# Patient Record
Sex: Female | Born: 1940 | Race: White | Hispanic: No | Smoking: Former smoker
Health system: Southern US, Community
[De-identification: ages and names within clinical notes are randomized; demographics above are authoritative.]

## PROBLEM LIST (undated history)

## (undated) DIAGNOSIS — I639 Cerebral infarction, unspecified: Secondary | ICD-10-CM

## (undated) DIAGNOSIS — I10 Essential (primary) hypertension: Secondary | ICD-10-CM

## (undated) DIAGNOSIS — Z9889 Other specified postprocedural states: Secondary | ICD-10-CM

## (undated) DIAGNOSIS — K635 Polyp of colon: Secondary | ICD-10-CM

## (undated) DIAGNOSIS — M1990 Unspecified osteoarthritis, unspecified site: Secondary | ICD-10-CM

## (undated) DIAGNOSIS — R7303 Prediabetes: Secondary | ICD-10-CM

## (undated) DIAGNOSIS — R519 Headache, unspecified: Secondary | ICD-10-CM

## (undated) DIAGNOSIS — I2510 Atherosclerotic heart disease of native coronary artery without angina pectoris: Secondary | ICD-10-CM

## (undated) DIAGNOSIS — D126 Benign neoplasm of colon, unspecified: Secondary | ICD-10-CM

## (undated) DIAGNOSIS — J189 Pneumonia, unspecified organism: Secondary | ICD-10-CM

## (undated) DIAGNOSIS — Z8489 Family history of other specified conditions: Secondary | ICD-10-CM

## (undated) DIAGNOSIS — N281 Cyst of kidney, acquired: Secondary | ICD-10-CM

## (undated) DIAGNOSIS — I471 Supraventricular tachycardia, unspecified: Secondary | ICD-10-CM

## (undated) DIAGNOSIS — I498 Other specified cardiac arrhythmias: Secondary | ICD-10-CM

## (undated) DIAGNOSIS — F411 Generalized anxiety disorder: Secondary | ICD-10-CM

## (undated) DIAGNOSIS — K429 Umbilical hernia without obstruction or gangrene: Secondary | ICD-10-CM

## (undated) DIAGNOSIS — I700 Atherosclerosis of aorta: Secondary | ICD-10-CM

## (undated) DIAGNOSIS — K219 Gastro-esophageal reflux disease without esophagitis: Secondary | ICD-10-CM

## (undated) DIAGNOSIS — I499 Cardiac arrhythmia, unspecified: Secondary | ICD-10-CM

## (undated) DIAGNOSIS — Z86010 Personal history of colonic polyps: Secondary | ICD-10-CM

## (undated) DIAGNOSIS — G459 Transient cerebral ischemic attack, unspecified: Secondary | ICD-10-CM

## (undated) DIAGNOSIS — K2970 Gastritis, unspecified, without bleeding: Secondary | ICD-10-CM

## (undated) DIAGNOSIS — Q438 Other specified congenital malformations of intestine: Secondary | ICD-10-CM

## (undated) DIAGNOSIS — F419 Anxiety disorder, unspecified: Secondary | ICD-10-CM

## (undated) DIAGNOSIS — Z8719 Personal history of other diseases of the digestive system: Secondary | ICD-10-CM

## (undated) DIAGNOSIS — K760 Fatty (change of) liver, not elsewhere classified: Secondary | ICD-10-CM

## (undated) DIAGNOSIS — E785 Hyperlipidemia, unspecified: Secondary | ICD-10-CM

## (undated) HISTORY — PX: REPLACEMENT TOTAL HIP W/  RESURFACING IMPLANTS: SUR1222

## (undated) HISTORY — DX: Cardiac arrhythmia, unspecified: I49.9

## (undated) HISTORY — PX: ORIF ANKLE FRACTURE: SUR919

## (undated) HISTORY — PX: CHOLECYSTECTOMY: SHX55

---
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
## 1961-04-29 DIAGNOSIS — R58 Hemorrhage, not elsewhere classified: Secondary | ICD-10-CM

## 1969-04-29 HISTORY — PX: APPENDECTOMY: SHX54

## 1969-04-29 HISTORY — PX: TUBAL LIGATION: SHX77

## 1978-04-29 HISTORY — PX: ARTHRODESIS: SHX136

## 1978-04-29 HISTORY — PX: BACK SURGERY: SHX140

## 1983-04-30 HISTORY — PX: LAPAROSCOPIC UNILATERAL SALPINGO OOPHERECTOMY: SHX5935

## 1983-04-30 HISTORY — PX: VAGINAL HYSTERECTOMY: SUR661

## 2001-09-25 DIAGNOSIS — I2510 Atherosclerotic heart disease of native coronary artery without angina pectoris: Secondary | ICD-10-CM

## 2001-09-25 HISTORY — DX: Atherosclerotic heart disease of native coronary artery without angina pectoris: I25.10

## 2001-09-25 HISTORY — PX: LEFT HEART CATH AND CORONARY ANGIOGRAPHY: CATH118249

## 2003-06-08 ENCOUNTER — Other Ambulatory Visit: Payer: Self-pay

## 2003-06-09 ENCOUNTER — Other Ambulatory Visit: Payer: Self-pay

## 2003-06-09 HISTORY — PX: LEFT HEART CATH AND CORONARY ANGIOGRAPHY: CATH118249

## 2003-06-10 ENCOUNTER — Other Ambulatory Visit: Payer: Self-pay

## 2005-03-18 ENCOUNTER — Other Ambulatory Visit: Payer: Self-pay

## 2005-03-18 ENCOUNTER — Inpatient Hospital Stay: Payer: Self-pay | Admitting: Internal Medicine

## 2006-02-25 ENCOUNTER — Inpatient Hospital Stay: Payer: Self-pay | Admitting: Specialist

## 2006-02-26 ENCOUNTER — Other Ambulatory Visit: Payer: Self-pay

## 2006-04-28 ENCOUNTER — Emergency Department: Payer: Self-pay | Admitting: Emergency Medicine

## 2007-07-15 ENCOUNTER — Emergency Department: Payer: Self-pay | Admitting: Internal Medicine

## 2009-10-27 ENCOUNTER — Emergency Department: Payer: Self-pay | Admitting: Emergency Medicine

## 2009-12-11 ENCOUNTER — Emergency Department: Payer: Self-pay | Admitting: Emergency Medicine

## 2010-06-13 ENCOUNTER — Emergency Department: Payer: Self-pay | Admitting: Emergency Medicine

## 2012-07-05 ENCOUNTER — Emergency Department: Payer: Self-pay | Admitting: Emergency Medicine

## 2012-07-05 LAB — BASIC METABOLIC PANEL
BUN: 14 mg/dL (ref 7–18)
Calcium, Total: 9 mg/dL (ref 8.5–10.1)
Creatinine: 1.13 mg/dL (ref 0.60–1.30)
EGFR (Non-African Amer.): 49 — ABNORMAL LOW
Glucose: 95 mg/dL (ref 65–99)
Osmolality: 270 (ref 275–301)
Sodium: 135 mmol/L — ABNORMAL LOW (ref 136–145)

## 2012-07-05 LAB — CBC
HCT: 42 % (ref 35.0–47.0)
HGB: 14.2 g/dL (ref 12.0–16.0)
MCH: 29.3 pg (ref 26.0–34.0)
MCHC: 33.9 g/dL (ref 32.0–36.0)
MCV: 86 fL (ref 80–100)
RBC: 4.87 10*6/uL (ref 3.80–5.20)

## 2012-07-05 LAB — URINALYSIS, COMPLETE
Bilirubin,UR: NEGATIVE
Glucose,UR: NEGATIVE mg/dL (ref 0–75)
Ketone: NEGATIVE
Nitrite: NEGATIVE
Ph: 7 (ref 4.5–8.0)
RBC,UR: 1878 /HPF (ref 0–5)
Squamous Epithelial: 2
WBC UR: 95 /HPF (ref 0–5)

## 2012-07-05 LAB — TROPONIN I: Troponin-I: <0.02 ng/mL

## 2012-07-08 LAB — URINE CULTURE

## 2012-11-20 DIAGNOSIS — N3020 Other chronic cystitis without hematuria: Secondary | ICD-10-CM | POA: Insufficient documentation

## 2012-11-20 DIAGNOSIS — N23 Unspecified renal colic: Secondary | ICD-10-CM | POA: Insufficient documentation

## 2012-11-20 DIAGNOSIS — R339 Retention of urine, unspecified: Secondary | ICD-10-CM | POA: Insufficient documentation

## 2012-11-20 DIAGNOSIS — N3941 Urge incontinence: Secondary | ICD-10-CM | POA: Insufficient documentation

## 2012-11-20 DIAGNOSIS — R399 Unspecified symptoms and signs involving the genitourinary system: Secondary | ICD-10-CM | POA: Insufficient documentation

## 2012-11-20 DIAGNOSIS — R310 Gross hematuria: Secondary | ICD-10-CM | POA: Insufficient documentation

## 2013-03-06 ENCOUNTER — Emergency Department: Payer: Self-pay | Admitting: Emergency Medicine

## 2013-03-06 LAB — URINALYSIS, COMPLETE
Glucose,UR: NEGATIVE mg/dL (ref 0–75)
Ketone: NEGATIVE
Nitrite: NEGATIVE
Ph: 8 (ref 4.5–8.0)
Protein: NEGATIVE
RBC,UR: 5 /HPF (ref 0–5)
Specific Gravity: 1.004 (ref 1.003–1.030)
Squamous Epithelial: 1
WBC UR: 249 /HPF (ref 0–5)

## 2013-03-16 DIAGNOSIS — Q619 Cystic kidney disease, unspecified: Secondary | ICD-10-CM | POA: Insufficient documentation

## 2013-07-01 DIAGNOSIS — N8111 Cystocele, midline: Secondary | ICD-10-CM | POA: Insufficient documentation

## 2013-12-02 LAB — HM MAMMOGRAPHY: HM MAMMO: NORMAL

## 2014-04-16 ENCOUNTER — Emergency Department: Payer: Self-pay | Admitting: Student

## 2014-05-09 DIAGNOSIS — Z23 Encounter for immunization: Secondary | ICD-10-CM | POA: Diagnosis not present

## 2014-05-14 ENCOUNTER — Observation Stay: Payer: Self-pay | Admitting: Internal Medicine

## 2014-05-14 DIAGNOSIS — R079 Chest pain, unspecified: Secondary | ICD-10-CM | POA: Diagnosis not present

## 2014-05-14 DIAGNOSIS — Z881 Allergy status to other antibiotic agents status: Secondary | ICD-10-CM | POA: Diagnosis not present

## 2014-05-14 DIAGNOSIS — I214 Non-ST elevation (NSTEMI) myocardial infarction: Secondary | ICD-10-CM | POA: Diagnosis not present

## 2014-05-14 DIAGNOSIS — Z888 Allergy status to other drugs, medicaments and biological substances status: Secondary | ICD-10-CM | POA: Diagnosis not present

## 2014-05-14 DIAGNOSIS — I10 Essential (primary) hypertension: Secondary | ICD-10-CM | POA: Diagnosis not present

## 2014-05-14 DIAGNOSIS — R0789 Other chest pain: Secondary | ICD-10-CM | POA: Diagnosis not present

## 2014-05-14 DIAGNOSIS — I340 Nonrheumatic mitral (valve) insufficiency: Secondary | ICD-10-CM | POA: Diagnosis not present

## 2014-05-14 DIAGNOSIS — Z882 Allergy status to sulfonamides status: Secondary | ICD-10-CM | POA: Diagnosis not present

## 2014-05-14 DIAGNOSIS — Z7982 Long term (current) use of aspirin: Secondary | ICD-10-CM | POA: Diagnosis not present

## 2014-05-14 DIAGNOSIS — I2510 Atherosclerotic heart disease of native coronary artery without angina pectoris: Secondary | ICD-10-CM | POA: Diagnosis not present

## 2014-05-14 DIAGNOSIS — Z90710 Acquired absence of both cervix and uterus: Secondary | ICD-10-CM | POA: Diagnosis not present

## 2014-05-14 DIAGNOSIS — K219 Gastro-esophageal reflux disease without esophagitis: Secondary | ICD-10-CM | POA: Diagnosis not present

## 2014-05-14 DIAGNOSIS — E785 Hyperlipidemia, unspecified: Secondary | ICD-10-CM | POA: Diagnosis not present

## 2014-05-14 DIAGNOSIS — I517 Cardiomegaly: Secondary | ICD-10-CM | POA: Diagnosis not present

## 2014-05-14 DIAGNOSIS — J9811 Atelectasis: Secondary | ICD-10-CM | POA: Diagnosis not present

## 2014-05-14 LAB — BASIC METABOLIC PANEL
Anion Gap: 7 (ref 7–16)
BUN: 11 mg/dL (ref 7–18)
Calcium, Total: 8.6 mg/dL (ref 8.5–10.1)
Chloride: 104 mmol/L (ref 98–107)
Co2: 29 mmol/L (ref 21–32)
Creatinine: 1.17 mg/dL (ref 0.60–1.30)
EGFR (African American): 58 — ABNORMAL LOW
EGFR (Non-African Amer.): 48 — ABNORMAL LOW
GLUCOSE: 95 mg/dL (ref 65–99)
Osmolality: 279 (ref 275–301)
Potassium: 3.6 mmol/L (ref 3.5–5.1)
Sodium: 140 mmol/L (ref 136–145)

## 2014-05-14 LAB — CBC
HCT: 42.5 % (ref 35.0–47.0)
HGB: 14.1 g/dL (ref 12.0–16.0)
MCH: 29.3 pg (ref 26.0–34.0)
MCHC: 33.1 g/dL (ref 32.0–36.0)
MCV: 89 fL (ref 80–100)
Platelet: 251 10*3/uL (ref 150–440)
RBC: 4.8 10*6/uL (ref 3.80–5.20)
RDW: 14 % (ref 11.5–14.5)
WBC: 6.6 10*3/uL (ref 3.6–11.0)

## 2014-05-14 LAB — HEPATIC FUNCTION PANEL A (ARMC)
ALK PHOS: 97 U/L
ALT: 23 U/L
AST: 32 U/L (ref 15–37)
Albumin: 3.8 g/dL (ref 3.4–5.0)
Bilirubin, Direct: <0.1 mg/dL (ref 0.0–0.2)
Bilirubin,Total: 0.3 mg/dL (ref 0.2–1.0)
Total Protein: 7.7 g/dL (ref 6.4–8.2)

## 2014-05-14 LAB — CK-MB
CK-MB: 2.5 ng/mL (ref 0.5–3.6)
CK-MB: 2.3 ng/mL (ref 0.5–3.6)

## 2014-05-14 LAB — TROPONIN I: Troponin-I: <0.02 ng/mL

## 2014-05-14 LAB — LIPASE, BLOOD: LIPASE: 156 U/L (ref 73–393)

## 2014-05-15 DIAGNOSIS — I2510 Atherosclerotic heart disease of native coronary artery without angina pectoris: Secondary | ICD-10-CM | POA: Diagnosis not present

## 2014-05-15 DIAGNOSIS — R079 Chest pain, unspecified: Secondary | ICD-10-CM | POA: Diagnosis not present

## 2014-05-15 DIAGNOSIS — I214 Non-ST elevation (NSTEMI) myocardial infarction: Secondary | ICD-10-CM | POA: Diagnosis not present

## 2014-05-15 DIAGNOSIS — K219 Gastro-esophageal reflux disease without esophagitis: Secondary | ICD-10-CM | POA: Diagnosis not present

## 2014-05-15 DIAGNOSIS — I10 Essential (primary) hypertension: Secondary | ICD-10-CM | POA: Diagnosis not present

## 2014-05-15 LAB — CBC WITH DIFFERENTIAL/PLATELET
Basophil #: 0.1 10*3/uL (ref 0.0–0.1)
Basophil %: 1.2 %
EOS PCT: 2 %
Eosinophil #: 0.2 10*3/uL (ref 0.0–0.7)
HCT: 39.1 % (ref 35.0–47.0)
HGB: 13.2 g/dL (ref 12.0–16.0)
Lymphocyte #: 3.5 10*3/uL (ref 1.0–3.6)
Lymphocyte %: 44.5 %
MCH: 29.9 pg (ref 26.0–34.0)
MCHC: 33.7 g/dL (ref 32.0–36.0)
MCV: 89 fL (ref 80–100)
MONOS PCT: 9.8 %
Monocyte #: 0.8 x10 3/mm (ref 0.2–0.9)
NEUTROS ABS: 3.3 10*3/uL (ref 1.4–6.5)
Neutrophil %: 42.5 %
Platelet: 234 10*3/uL (ref 150–440)
RBC: 4.41 10*6/uL (ref 3.80–5.20)
RDW: 14.2 % (ref 11.5–14.5)
WBC: 7.8 10*3/uL (ref 3.6–11.0)

## 2014-05-15 LAB — TSH: THYROID STIMULATING HORM: 3.12 u[IU]/mL

## 2014-05-15 LAB — CK-MB: CK-MB: 1.9 ng/mL (ref 0.5–3.6)

## 2014-05-15 LAB — LIPID PANEL
Cholesterol: 202 mg/dL — ABNORMAL HIGH (ref 0–200)
HDL Cholesterol: 36 mg/dL — ABNORMAL LOW (ref 40–60)
LDL CHOLESTEROL, CALC: 118 mg/dL — AB (ref 0–100)
TRIGLYCERIDES: 238 mg/dL — AB (ref 0–200)
VLDL CHOLESTEROL, CALC: 48 mg/dL — AB (ref 5–40)

## 2014-05-15 LAB — BASIC METABOLIC PANEL
Anion Gap: 10 (ref 7–16)
BUN: 13 mg/dL (ref 7–18)
CHLORIDE: 106 mmol/L (ref 98–107)
Calcium, Total: 8.5 mg/dL (ref 8.5–10.1)
Co2: 26 mmol/L (ref 21–32)
Creatinine: 1.19 mg/dL (ref 0.60–1.30)
EGFR (African American): 57 — ABNORMAL LOW
GFR CALC NON AF AMER: 47 — AB
GLUCOSE: 134 mg/dL — AB (ref 65–99)
Osmolality: 285 (ref 275–301)
POTASSIUM: 3.2 mmol/L — AB (ref 3.5–5.1)
SODIUM: 142 mmol/L (ref 136–145)

## 2014-05-15 LAB — TROPONIN I: Troponin-I: <0.02 ng/mL

## 2014-05-18 DIAGNOSIS — R079 Chest pain, unspecified: Secondary | ICD-10-CM | POA: Diagnosis not present

## 2014-05-18 DIAGNOSIS — E785 Hyperlipidemia, unspecified: Secondary | ICD-10-CM | POA: Diagnosis not present

## 2014-05-18 DIAGNOSIS — K209 Esophagitis, unspecified: Secondary | ICD-10-CM | POA: Diagnosis not present

## 2014-05-18 DIAGNOSIS — I10 Essential (primary) hypertension: Secondary | ICD-10-CM | POA: Diagnosis not present

## 2014-05-19 DIAGNOSIS — I2510 Atherosclerotic heart disease of native coronary artery without angina pectoris: Secondary | ICD-10-CM | POA: Insufficient documentation

## 2014-05-19 DIAGNOSIS — R079 Chest pain, unspecified: Secondary | ICD-10-CM | POA: Insufficient documentation

## 2014-05-19 DIAGNOSIS — I25119 Atherosclerotic heart disease of native coronary artery with unspecified angina pectoris: Secondary | ICD-10-CM | POA: Diagnosis not present

## 2014-05-19 DIAGNOSIS — K219 Gastro-esophageal reflux disease without esophagitis: Secondary | ICD-10-CM | POA: Insufficient documentation

## 2014-05-19 DIAGNOSIS — E782 Mixed hyperlipidemia: Secondary | ICD-10-CM | POA: Diagnosis not present

## 2014-05-19 DIAGNOSIS — I10 Essential (primary) hypertension: Secondary | ICD-10-CM | POA: Diagnosis not present

## 2014-05-19 DIAGNOSIS — K210 Gastro-esophageal reflux disease with esophagitis: Secondary | ICD-10-CM | POA: Diagnosis not present

## 2014-06-01 DIAGNOSIS — E782 Mixed hyperlipidemia: Secondary | ICD-10-CM | POA: Diagnosis not present

## 2014-06-01 DIAGNOSIS — I2510 Atherosclerotic heart disease of native coronary artery without angina pectoris: Secondary | ICD-10-CM | POA: Diagnosis not present

## 2014-06-01 DIAGNOSIS — I10 Essential (primary) hypertension: Secondary | ICD-10-CM | POA: Diagnosis not present

## 2014-06-01 DIAGNOSIS — R0789 Other chest pain: Secondary | ICD-10-CM | POA: Diagnosis not present

## 2014-06-01 DIAGNOSIS — R079 Chest pain, unspecified: Secondary | ICD-10-CM | POA: Diagnosis not present

## 2014-07-01 DIAGNOSIS — N8111 Cystocele, midline: Secondary | ICD-10-CM | POA: Diagnosis not present

## 2014-07-01 DIAGNOSIS — N1330 Unspecified hydronephrosis: Secondary | ICD-10-CM | POA: Diagnosis not present

## 2014-07-01 DIAGNOSIS — Q6100 Congenital renal cyst, unspecified: Secondary | ICD-10-CM | POA: Diagnosis not present

## 2014-07-01 DIAGNOSIS — N3289 Other specified disorders of bladder: Secondary | ICD-10-CM | POA: Diagnosis not present

## 2014-07-01 DIAGNOSIS — R310 Gross hematuria: Secondary | ICD-10-CM | POA: Diagnosis not present

## 2014-07-01 DIAGNOSIS — N281 Cyst of kidney, acquired: Secondary | ICD-10-CM | POA: Diagnosis not present

## 2014-07-01 DIAGNOSIS — N3020 Other chronic cystitis without hematuria: Secondary | ICD-10-CM | POA: Diagnosis not present

## 2014-07-01 DIAGNOSIS — N3941 Urge incontinence: Secondary | ICD-10-CM | POA: Diagnosis not present

## 2014-07-01 DIAGNOSIS — N134 Hydroureter: Secondary | ICD-10-CM | POA: Diagnosis not present

## 2014-08-28 NOTE — H&P (Signed)
PATIENT NAME:  Amanda Duke, MARSCHKE MR#:  [id number] DATE OF BIRTH:  06-14-1940  DATE OF ADMISSION:  05/14/2014  PRIMARY CARE PHYSICIAN: Fara Olden B. Jacqualine Code, MD   PRIMARY CARDIOLOGISTS: Dr. Clayborn Bigness, Dr. Saralyn Pilar.  REFERRING EMERGENCY ROOM PHYSICIAN: Cory R. Karma Greaser, MD   CHIEF COMPLAINT: Chest pain.   HISTORY OF PRESENT ILLNESS: The patient is a 74 year old pleasant Caucasian female with a past medical history of coronary artery disease, 2-vessel blockage, cardiac catheterization more than 5 years ago with no blockages. As reported by the patient, is presenting to the ED with a chief complaint of chest pain. The patient reports that she was in her usual state of health until today morning when eventually, she was under stress, and  while trying to hang a clock in the house, she started having sharp pressure-like chest pain. The chest pain was radiating to the right side, associated with cold and clamminess and shortness of breath. She was dizzy. The patient tried to rest for 30 minutes, following which her pressure was better, but still she had some residual chest pressure, which made her come to the hospital. According to the daughter, when the EMS came, her initial blood pressure was at 198/118, and heart rate was at 100. In the ED, the patient was given aspirin, initial troponin was negative. EKG has revealed no acute ST or T-wave elevations or depressions. The hospitalist team is called to admit the patient. During my examination, the patient is resting comfortably. Daughter is bedside. Denies any chest pressure.   PAST MEDICAL HISTORY: Coronary artery disease with a history of 2 vessel blockages on medical management, hypertension, hyperlipidemia, and GERD.   PAST SURGICAL HISTORY: Hysterectomy, back surgery, ankle surgery, cardiac catheterization more than 5 years ago.   ALLERGIES: ALLERGIC TO METOPROLOL, SULFA, ZOCOR, LOPID, LEVAQUIN, CLINDAMYCIN, AMOXICILLIN.   PSYCHOSOCIAL HISTORY: Lives at  home with son. No history of smoking, alcohol, or illicit drug use.   FAMILY HISTORY: Heart disease runs in her family.   REVIEW OF SYSTEMS: CONSTITUTIONAL: Denies any fever, fatigue, weakness.  EYES: Denies blurry vision, double vision, glaucoma.  EARS, NOSE, AND THROAT: Denies epistaxis, discharge.  RESPIRATORY: Denies cough, COPD.  CARDIOVASCULAR: Complaining of chest pressure, which is relieved after getting 4 baby aspirin. Denies palpitations, syncope.  GASTROINTESTINAL: Denies nausea, vomiting, diarrhea, abdominal pain, hematemesis.  GENITOURINARY: No dysuria, hematuria.  GYNECOLOGICAL: Breast: Denies breast mass or vaginal discharge  ENDOCRINE: Denies polyuria, nocturia, or thyroid problems.  HEMATOLOGIC AND LYMPHATIC: No anemia, easy bruising, bleeding.  INTEGUMENTARY: No acne, rash, lesions.  MUSCULOSKELETAL: No joint pain in the neck and back. Denies any gout.  NEUROLOGIC: Denies vertigo, ataxia. TIA, CVA.  PSYCHIATRIC: No ADD or OCD.   HOME MEDICATIONS:  1. Clonazepam 0.5 milligram 1 tablet once daily. 2. Prednisone tapering dose for 4 days.  3. Crestor 10 mg p.o. once daily.  4. Hydrochlorothiazide/Lisinopril 12.5/10 one tablet p.o. once daily.  5. Hydroxyzine 20 mg q. 6 hours as needed for itching.  6. Omeprazole 40 mg p.o. once daily.   PHYSICAL EXAMINATION: VITAL SIGNS: Temperature 98.6, pulse 72, respirations 16-18, blood pressure 137/81 and repeat blood pressure 147/74, pulse oximetry 99% on room air.  GENERAL APPEARANCE: Not in acute distress. Moderately built and nourished.  HEENT: Normocephalic, atraumatic. Pupils are equally reactive to light and accommodation. No scleral icterus. No conjunctival injection. No sinus tenderness. No postnasal drip. Moist mucous membranes.  NECK: Supple. No JVD. No thyromegaly. Range of motion is intact.  LUNGS: Clear to auscultation bilaterally.  No accessory muscle use and no anterior chest wall tenderness on palpation.  CARDIAC:  S1, S2 normal. Regular rate and rhythm, faint cardiac murmur is present 2/5. No anterior chest wall tenderness on palpation.  GASTROINTESTINAL: Soft. Bowel sounds are positive in all 4 quadrants. Nontender, nondistended. No hepatosplenomegaly. No masses felt.  NEUROLOGIC: Awake, alert, and oriented x 3. Cranial nerves II through XII are grossly intact. Motor and sensory are intact. Reflexes are 2+.  EXTREMITIES: No edema. No cyanosis. No clubbing.  SKIN: Warm to touch. Normal turgor. No rashes. No lesions.  MUSCULOSKELETAL: No joint effusion, tenderness, erythema.  PSYCHIATRIC: Normal mood and affect.   LABORATORY DATA AND IMAGING STUDIES: Chest x-ray, portable: Stable mild cardiomegaly, minimal linear atelectasis at the left lung base. No acute cardiopulmonary disease, otherwise hiatal hernia. A 12-lead EKG: Normal sinus rhythm, no acute ST or T-wave changes. Initial troponin was 10.02. LFTs are normal. BMP is normal except GFR at 48. CBC is normal.   ASSESSMENT AND PLAN: A 74 year old Caucasian female presenting to the ED with chief complaint of chest pressure, lasted for approximately 30 minutes, and subsequently resolved completely with 4 baby aspirin. Will be admitted with the following assessment and plan.  1. Chest pain with history of coronary artery disease and 2-vessel blockage. Cardiac catheterization was done greater than 5 years ago. Will rule out acute myocardial infarction, admit to observation telemetry, cycle cardiac biomarkers. The patient will be on ACS protocol. The patient being allergic to METOPROLOL, I cannot give her METOPROLOL. We will continue aspirin, statin, and morphine as needed basis. Continue oxygen. If troponins are trending, we will consider cardiology consult.  2. Hypertension. Blood pressure is stable at this time. Will continue her home medication hydrochlorothiazide and lisinopril and up titrate on as needed basis.  3. Hyperlipidemia. Continue statin. Check fasting  lipid panel.  4. Gastroesophageal reflux disease. The patient will be on H2-blockers during the hospital course. We will provide her GI prophylaxis with Pepcid and DVT prophylaxis with Lovenox subcutaneous.   The plan of care discussed in detail with the patient and her daughter at bedside. They both verbalized understanding of the plan.   TOTAL TIME SPENT ON THE ADMISSION: 45 minutes.    ____________________________ Nicholes Mango, MD ag:mw D: 05/14/2014 18:42:50 ET T: 05/14/2014 19:42:13 ET JOB#: [id number]  cc: Nicholes Mango, MD, <Dictator> Nicholes Mango MD ELECTRONICALLY SIGNED 05/15/2014 14:58

## 2014-08-28 NOTE — Discharge Summary (Signed)
PATIENT NAME:  Amanda Duke, Amanda Duke MR#:  [id number] DATE OF BIRTH:  July 30, 1940  DATE OF ADMISSION:  05/14/2014 DATE OF DISCHARGE:  05/15/2014  ADMISSION DIAGNOSIS: Chest pain.   DISCHARGE DIAGNOSES:  1.  Chest pain, atypical in nature; negative cardiac enzymes and needs outpatient cardiology followup. The chest pain could have been related to accelerated blood pressure on presentation and anxiety.   2.  Coronary artery disease with history of 2-vessel blockage, on medical management.  3.  Hypertension.  4.  Hyperlipidemia.  5.  Status post hysterectomy.  6.  Status post back surgery.  7.  Status post ankle surgery.   CONSULTANTS: None.   PERTINENT LABORATORY AND EVALUATIONS: WBC count 6.6, hemoglobin 14.1, platelet count 251,000. On EKG, normal sinus rhythm without any ST-T wave changes. Echocardiogram normal ejection fraction, normal global left ventricular systolic function, mild mitral valve regurgitation. Chest x-ray showed no cardiopulmonary processes, except minimal linear atelectasis.   HOSPITAL COURSE: Please refer to H and P done by the admitting physician. The patient is a 74 year old white female who was under stress at home and started having chest pain. The patient was admitted under observation and serial cardiac enzymes were done. On presentation, her blood pressure was elevated. Due to these symptoms, she was admitted to the hospital and placed under observation overnight, with no further chest pain. Her cardiac enzymes remained negative. She has seen a cardiologist in the past, and will need outpatient followup with a cardiologist in the coming week to see if any further workup needs to be done. At this time, she is stable for discharge.   DISCHARGE MEDICATIONS: Omeprazole 40 daily, hydroxyzine/hydrochloride 25 mg 1 tablet 4 times a day as needed, clonazepam 0.5 daily as needed, Crestor 20 daily at bedtime, hydrochlorothiazide/lisinopril 12.5/10 daily, and aspirin 325 p.o. daily.    DIET: Low-sodium, low-fat, low-cholesterol.   FOLLOWUP: [facility] in 1-2 weeks. Follow with primary MD in 1-2 weeks. The patient is to keep a log of her blood pressures to take to primary care provider, due to her labile blood pressure.   TIME SPENT ON THIS DISCHARGE: 35 minutes.     ____________________________ Lafonda Mosses Posey Pronto, MD shp:MT D: 05/16/2014 14:36:01 ET T: 05/16/2014 14:52:54 ET JOB#: [id number]  cc: Tessi Eustache H. Posey Pronto, MD, <Dictator> Alric Seton MD ELECTRONICALLY SIGNED 05/18/2014 14:41

## 2014-08-29 DIAGNOSIS — E782 Mixed hyperlipidemia: Secondary | ICD-10-CM | POA: Diagnosis not present

## 2014-08-29 DIAGNOSIS — G4700 Insomnia, unspecified: Secondary | ICD-10-CM | POA: Diagnosis not present

## 2014-08-29 DIAGNOSIS — E2839 Other primary ovarian failure: Secondary | ICD-10-CM | POA: Diagnosis not present

## 2014-08-29 DIAGNOSIS — M810 Age-related osteoporosis without current pathological fracture: Secondary | ICD-10-CM | POA: Diagnosis not present

## 2014-08-29 DIAGNOSIS — I10 Essential (primary) hypertension: Secondary | ICD-10-CM | POA: Diagnosis not present

## 2014-08-30 DIAGNOSIS — Z0001 Encounter for general adult medical examination with abnormal findings: Secondary | ICD-10-CM | POA: Diagnosis not present

## 2014-08-30 DIAGNOSIS — E782 Mixed hyperlipidemia: Secondary | ICD-10-CM | POA: Diagnosis not present

## 2014-08-30 DIAGNOSIS — N183 Chronic kidney disease, stage 3 (moderate): Secondary | ICD-10-CM | POA: Diagnosis not present

## 2014-08-30 DIAGNOSIS — M810 Age-related osteoporosis without current pathological fracture: Secondary | ICD-10-CM | POA: Diagnosis not present

## 2014-08-30 DIAGNOSIS — I10 Essential (primary) hypertension: Secondary | ICD-10-CM | POA: Diagnosis not present

## 2014-09-30 DIAGNOSIS — H2513 Age-related nuclear cataract, bilateral: Secondary | ICD-10-CM | POA: Diagnosis not present

## 2014-10-04 DIAGNOSIS — R300 Dysuria: Secondary | ICD-10-CM | POA: Diagnosis not present

## 2014-10-04 DIAGNOSIS — I10 Essential (primary) hypertension: Secondary | ICD-10-CM | POA: Diagnosis not present

## 2014-10-04 DIAGNOSIS — E782 Mixed hyperlipidemia: Secondary | ICD-10-CM | POA: Diagnosis not present

## 2014-10-04 DIAGNOSIS — N189 Chronic kidney disease, unspecified: Secondary | ICD-10-CM | POA: Diagnosis not present

## 2014-10-04 DIAGNOSIS — R7301 Impaired fasting glucose: Secondary | ICD-10-CM | POA: Diagnosis not present

## 2014-10-04 DIAGNOSIS — E559 Vitamin D deficiency, unspecified: Secondary | ICD-10-CM | POA: Diagnosis not present

## 2014-10-04 DIAGNOSIS — Z0001 Encounter for general adult medical examination with abnormal findings: Secondary | ICD-10-CM | POA: Diagnosis not present

## 2014-12-16 DIAGNOSIS — I10 Essential (primary) hypertension: Secondary | ICD-10-CM | POA: Insufficient documentation

## 2014-12-28 DIAGNOSIS — I10 Essential (primary) hypertension: Secondary | ICD-10-CM | POA: Diagnosis not present

## 2014-12-28 DIAGNOSIS — I2510 Atherosclerotic heart disease of native coronary artery without angina pectoris: Secondary | ICD-10-CM | POA: Diagnosis not present

## 2014-12-28 DIAGNOSIS — E782 Mixed hyperlipidemia: Secondary | ICD-10-CM | POA: Diagnosis not present

## 2015-01-09 DIAGNOSIS — E782 Mixed hyperlipidemia: Secondary | ICD-10-CM | POA: Diagnosis not present

## 2015-01-09 DIAGNOSIS — I10 Essential (primary) hypertension: Secondary | ICD-10-CM | POA: Diagnosis not present

## 2015-01-09 DIAGNOSIS — E559 Vitamin D deficiency, unspecified: Secondary | ICD-10-CM | POA: Diagnosis not present

## 2015-01-10 DIAGNOSIS — I2510 Atherosclerotic heart disease of native coronary artery without angina pectoris: Secondary | ICD-10-CM | POA: Diagnosis not present

## 2015-01-10 DIAGNOSIS — I10 Essential (primary) hypertension: Secondary | ICD-10-CM | POA: Diagnosis not present

## 2015-01-10 DIAGNOSIS — E782 Mixed hyperlipidemia: Secondary | ICD-10-CM | POA: Diagnosis not present

## 2015-01-10 DIAGNOSIS — M150 Primary generalized (osteo)arthritis: Secondary | ICD-10-CM | POA: Diagnosis not present

## 2015-01-10 DIAGNOSIS — R7301 Impaired fasting glucose: Secondary | ICD-10-CM | POA: Diagnosis not present

## 2015-01-10 DIAGNOSIS — N182 Chronic kidney disease, stage 2 (mild): Secondary | ICD-10-CM | POA: Diagnosis not present

## 2015-01-25 ENCOUNTER — Other Ambulatory Visit: Payer: Self-pay

## 2015-01-25 DIAGNOSIS — Z803 Family history of malignant neoplasm of breast: Secondary | ICD-10-CM

## 2015-01-25 DIAGNOSIS — N182 Chronic kidney disease, stage 2 (mild): Secondary | ICD-10-CM | POA: Diagnosis not present

## 2015-01-25 DIAGNOSIS — Z8041 Family history of malignant neoplasm of ovary: Secondary | ICD-10-CM

## 2015-01-27 ENCOUNTER — Ambulatory Visit: Payer: Medicare Other

## 2015-01-27 DIAGNOSIS — Z803 Family history of malignant neoplasm of breast: Secondary | ICD-10-CM | POA: Diagnosis not present

## 2015-01-27 DIAGNOSIS — Z8041 Family history of malignant neoplasm of ovary: Secondary | ICD-10-CM

## 2015-02-02 ENCOUNTER — Encounter: Payer: Self-pay | Admitting: Obstetrics and Gynecology

## 2015-02-02 ENCOUNTER — Ambulatory Visit (INDEPENDENT_AMBULATORY_CARE_PROVIDER_SITE_OTHER): Payer: Medicare Other | Admitting: Obstetrics and Gynecology

## 2015-02-02 VITALS — BP 142/83 | HR 66 | Ht 65.6 in | Wt 186.1 lb

## 2015-02-02 DIAGNOSIS — Z803 Family history of malignant neoplasm of breast: Secondary | ICD-10-CM

## 2015-02-02 DIAGNOSIS — Z124 Encounter for screening for malignant neoplasm of cervix: Secondary | ICD-10-CM

## 2015-02-02 DIAGNOSIS — K5900 Constipation, unspecified: Secondary | ICD-10-CM

## 2015-02-02 DIAGNOSIS — K581 Irritable bowel syndrome with constipation: Secondary | ICD-10-CM | POA: Insufficient documentation

## 2015-02-02 DIAGNOSIS — R634 Abnormal weight loss: Secondary | ICD-10-CM

## 2015-02-02 DIAGNOSIS — Z8041 Family history of malignant neoplasm of ovary: Secondary | ICD-10-CM | POA: Diagnosis not present

## 2015-02-02 DIAGNOSIS — F419 Anxiety disorder, unspecified: Secondary | ICD-10-CM | POA: Diagnosis not present

## 2015-02-02 DIAGNOSIS — M810 Age-related osteoporosis without current pathological fracture: Secondary | ICD-10-CM

## 2015-02-02 NOTE — Patient Instructions (Signed)
1.  Pelvic ultrasound is normal. 2.  CA-125 blood test is drawn today. 3.  Return in 6 months for follow-up and further management planning.

## 2015-02-02 NOTE — Progress Notes (Signed)
Patient ID: Amanda Duke, female   DOB: [birth year], 74 y.o.   MRN: [id number] Annual. Hysterectomy, vaginal Bone density: abnormal, Osteoporosis Pap smear: 2002, normal FHX: ovarian cancer Mammogram: 06/2012 normal   GYN ANNUAL PREVENTATIVE CARE ENCOUNTER NOTE  Subjective:       Amanda Duke is a 73 y.o. [id number] female here for a routine Medicare gynecologic exam.   Current complaints: .   1.  Family history of breast cancer (mother age 54). 2.  Family history ovarian cancer (sister age 39) 75.  New onset constipation, and 7 pound weight loss  Recent ultrasound demonstrates nonvisualization of the ovaries and no adnexal masses.   Gynecologic History No LMP recorded. Patient has had a hysterectomy. TVH 1985 secondary to menorrhagia Contraception:TVH; menopause Last Pap:  2002, normal.  Last mammogram: 06/2012 normal osteoporosis  Obstetric History OB History  Gravida Para Term Preterm AB SAB TAB Ectopic Multiple Living  8 7 7  1 1    7     # Outcome Date GA Lbr Len/2nd Weight Sex Delivery Anes PTL Lv  8 Term 1971   6 lb 14 oz (3.118 kg)  Vag-Spont   Y  7 Term 1969   6 lb 13 oz (3.09 kg) M Vag-Spont   Y  6 Term 1965   6 lb 11 oz (3.033 kg) F Vag-Spont   Y  5 Term 1964   6 lb 15 oz (3.147 kg) [place]  4 Term 1963   6 lb 11 oz (3.033 kg) F Vag-Spont   Y     Complications: Hemorrhage  3 Term 1962   5 lb 11 oz (2.58 kg) F Vag-Spont   Y  2 SAB 1961        FD  1 Term 1960   6 lb 14 oz (3.118 kg) M Vag-Spont   Y      History reviewed. No pertinent past medical history.  Past Surgical History  Procedure Laterality Date  . Tonsillectomy  1948  . Appendectomy  1971  . Tubal ligation  1971  . Arthrodesis  1980  . Back surgery  1980  . Vaginal hysterectomy  1985    d/t heavy bleeding  . Laparoscopic unilateral salpingo oopherectomy  1985    right    No current outpatient prescriptions on file prior to visit.   No current facility-administered medications on  file prior to visit.    Allergies  Allergen Reactions  . Amoxicillin Hives  . Clindamycin Diarrhea  . Gemfibrozil Other (See Comments)    muscle ache  . Levofloxacin In D5w Other (See Comments)  . Metoprolol Other (See Comments)  . Sulfa Antibiotics Other (See Comments)  . Nystatin-Triamcinolone Rash    Social History   Social History  . Marital Status: Widowed    Spouse Name: N/A  . Number of Children: N/A  . Years of Education: N/A   Occupational History  . Not on file.   Social History Main Topics  . Smoking status: Former Research scientist (life sciences)  . Smokeless tobacco: Never Used  . Alcohol Use: No  . Drug Use: Not on file  . Sexual Activity: No   Other Topics Concern  . Not on file   Social History Narrative  . No narrative on file    Family History  Problem Relation Age of Onset  . Heart failure Mother   . Stroke Mother   . Breast cancer Mother     dx at age 14  yo  . Diabetes Mother   . Emphysema Father   . Ovarian cancer Sister     dx at age 95 yo  . Asthma Sister   . Osteoporosis Mother     The following portions of the patient's history were reviewed and updated as appropriate: allergies, current medications, past family history, past medical history, past social history, past surgical history and problem list.  Review of Systems Review of Systems  Constitutional: Positive for weight loss. Negative for fever and chills.  HENT: Negative.   Respiratory: Negative.   Cardiovascular: Negative.   Gastrointestinal: Positive for constipation. Negative for nausea, vomiting, abdominal pain, diarrhea, blood in stool and melena.  Genitourinary: Negative.   Skin: Negative.   Neurological: Negative for weakness.  Endo/Heme/Allergies: Negative.   Psychiatric/Behavioral: Negative.      Objective:   BP 142/83 mmHg  Pulse 66  Ht 5' 5.6" (1.666 m)  Wt 186 lb 1 oz (84.397 kg)  BMI 30.41 kg/m2 Physical Exam  Constitutional: She is oriented to person, place, and time.  She appears well-developed and well-nourished.  HENT:  Head: Normocephalic and atraumatic.  Eyes: Conjunctivae are normal.  Neck: Normal range of motion. Neck supple. No tracheal deviation present. No thyromegaly present.  Cardiovascular: Normal rate, regular rhythm and normal heart sounds.   No murmur heard. Pulmonary/Chest: Effort normal and breath sounds normal.  Abdominal: Soft. She exhibits no distension and no mass. There is no tenderness. No hernia. Hernia confirmed negative in the right inguinal area and confirmed negative in the left inguinal area.  Genitourinary: Vagina normal. Rectal exam shows no mass, no tenderness and anal tone normal. There is no rash, tenderness or lesion on the right labia. There is no rash, tenderness or lesion on the left labia. Right adnexum displays no mass and no tenderness. Left adnexum displays no mass and no tenderness.  Musculoskeletal: She exhibits no edema.  Lymphadenopathy:    She has no cervical adenopathy.  Neurological: She is alert and oriented to person, place, and time.  Skin: Skin is warm and dry.  Psychiatric: She has a normal mood and affect. Her behavior is normal. Thought content normal.    Assessment:   Annual gynecologic examination 74 y.o. Contraception: status post hysterectomyTVH Normal BMI Family history of breast cancer. Family history of ovarian cancer. Recent onset of constipation and weight loss  Plan:  Pap: Not needed Mammogram: Ordered Labs: Primary care Routine preventative health maintenance measures emphasized: Diet/Weight control, Tobacco Cessation and Alcohol/Drug use CA-125 Return to Clinic - 6 months for follow-up. Patient is contemplating prophylactic BSO.  In light of her family history.   Brayton Mars, MD

## 2015-02-03 LAB — CA 125: CA 125: 13 U/mL (ref 0.0–38.1)

## 2015-02-06 DIAGNOSIS — I10 Essential (primary) hypertension: Secondary | ICD-10-CM | POA: Diagnosis not present

## 2015-02-06 DIAGNOSIS — N182 Chronic kidney disease, stage 2 (mild): Secondary | ICD-10-CM | POA: Diagnosis not present

## 2015-02-07 DIAGNOSIS — I10 Essential (primary) hypertension: Secondary | ICD-10-CM | POA: Diagnosis not present

## 2015-02-07 DIAGNOSIS — N182 Chronic kidney disease, stage 2 (mild): Secondary | ICD-10-CM | POA: Diagnosis not present

## 2015-02-07 DIAGNOSIS — N281 Cyst of kidney, acquired: Secondary | ICD-10-CM | POA: Diagnosis not present

## 2015-02-07 DIAGNOSIS — M150 Primary generalized (osteo)arthritis: Secondary | ICD-10-CM | POA: Diagnosis not present

## 2015-02-08 ENCOUNTER — Ambulatory Visit
Admission: RE | Admit: 2015-02-08 | Discharge: 2015-02-08 | Disposition: A | Discharge status: Home / Self Care | Payer: Medicare Other | Source: Ambulatory Visit | Attending: Physician Assistant | Admitting: Physician Assistant

## 2015-02-08 ENCOUNTER — Other Ambulatory Visit: Payer: Self-pay | Admitting: Physician Assistant

## 2015-02-08 DIAGNOSIS — M25562 Pain in left knee: Secondary | ICD-10-CM | POA: Diagnosis not present

## 2015-02-08 DIAGNOSIS — M25462 Effusion, left knee: Secondary | ICD-10-CM | POA: Diagnosis not present

## 2015-02-08 DIAGNOSIS — M25461 Effusion, right knee: Secondary | ICD-10-CM | POA: Diagnosis not present

## 2015-02-20 DIAGNOSIS — N281 Cyst of kidney, acquired: Secondary | ICD-10-CM | POA: Diagnosis not present

## 2015-02-20 DIAGNOSIS — I10 Essential (primary) hypertension: Secondary | ICD-10-CM | POA: Diagnosis not present

## 2015-02-20 DIAGNOSIS — N182 Chronic kidney disease, stage 2 (mild): Secondary | ICD-10-CM | POA: Diagnosis not present

## 2015-03-01 DIAGNOSIS — N3941 Urge incontinence: Secondary | ICD-10-CM | POA: Diagnosis not present

## 2015-03-01 DIAGNOSIS — N281 Cyst of kidney, acquired: Secondary | ICD-10-CM | POA: Diagnosis not present

## 2015-03-01 DIAGNOSIS — N3020 Other chronic cystitis without hematuria: Secondary | ICD-10-CM | POA: Diagnosis not present

## 2015-04-20 DIAGNOSIS — Z1231 Encounter for screening mammogram for malignant neoplasm of breast: Secondary | ICD-10-CM | POA: Diagnosis not present

## 2015-05-02 DIAGNOSIS — I10 Essential (primary) hypertension: Secondary | ICD-10-CM | POA: Diagnosis not present

## 2015-05-10 ENCOUNTER — Emergency Department: Payer: Medicare Other

## 2015-05-10 ENCOUNTER — Emergency Department
Admission: EM | Admit: 2015-05-10 | Discharge: 2015-05-11 | Disposition: A | Discharge status: Home / Self Care | Payer: Medicare Other | Attending: Emergency Medicine | Admitting: Emergency Medicine

## 2015-05-10 ENCOUNTER — Encounter: Payer: Self-pay | Admitting: Urgent Care

## 2015-05-10 DIAGNOSIS — R61 Generalized hyperhidrosis: Secondary | ICD-10-CM | POA: Insufficient documentation

## 2015-05-10 DIAGNOSIS — Z79899 Other long term (current) drug therapy: Secondary | ICD-10-CM | POA: Insufficient documentation

## 2015-05-10 DIAGNOSIS — R0602 Shortness of breath: Secondary | ICD-10-CM | POA: Diagnosis not present

## 2015-05-10 DIAGNOSIS — R002 Palpitations: Secondary | ICD-10-CM

## 2015-05-10 DIAGNOSIS — R079 Chest pain, unspecified: Secondary | ICD-10-CM | POA: Diagnosis not present

## 2015-05-10 DIAGNOSIS — Z880 Allergy status to penicillin: Secondary | ICD-10-CM | POA: Insufficient documentation

## 2015-05-10 DIAGNOSIS — I10 Essential (primary) hypertension: Secondary | ICD-10-CM | POA: Diagnosis not present

## 2015-05-10 DIAGNOSIS — Z87891 Personal history of nicotine dependence: Secondary | ICD-10-CM | POA: Insufficient documentation

## 2015-05-10 DIAGNOSIS — R0600 Dyspnea, unspecified: Secondary | ICD-10-CM | POA: Diagnosis not present

## 2015-05-10 HISTORY — DX: Gastro-esophageal reflux disease without esophagitis: K21.9

## 2015-05-10 HISTORY — DX: Essential (primary) hypertension: I10

## 2015-05-10 HISTORY — DX: Prediabetes: R73.03

## 2015-05-10 HISTORY — DX: Cyst of kidney, acquired: N28.1

## 2015-05-10 HISTORY — DX: Hyperlipidemia, unspecified: E78.5

## 2015-05-10 LAB — CBC
HCT: 42.4 % (ref 35.0–47.0)
HEMOGLOBIN: 14.4 g/dL (ref 12.0–16.0)
MCH: 29.5 pg (ref 26.0–34.0)
MCHC: 33.9 g/dL (ref 32.0–36.0)
MCV: 87 fL (ref 80.0–100.0)
Platelets: 231 10*3/uL (ref 150–440)
RBC: 4.88 MIL/uL (ref 3.80–5.20)
RDW: 13.9 % (ref 11.5–14.5)
WBC: 9.2 10*3/uL (ref 3.6–11.0)

## 2015-05-10 LAB — BASIC METABOLIC PANEL
ANION GAP: 7 (ref 5–15)
BUN: 14 mg/dL (ref 6–20)
CALCIUM: 9.1 mg/dL (ref 8.9–10.3)
CO2: 27 mmol/L (ref 22–32)
Chloride: 104 mmol/L (ref 101–111)
Creatinine, Ser: 1.11 mg/dL — ABNORMAL HIGH (ref 0.44–1.00)
GFR, EST AFRICAN AMERICAN: 55 mL/min — AB (ref >60)
GFR, EST NON AFRICAN AMERICAN: 48 mL/min — AB (ref >60)
Glucose, Bld: 100 mg/dL — ABNORMAL HIGH (ref 65–99)
Potassium: 3.6 mmol/L (ref 3.5–5.1)
SODIUM: 138 mmol/L (ref 135–145)

## 2015-05-10 LAB — TROPONIN I

## 2015-05-10 NOTE — ED Notes (Signed)
Patient presents with reports of palpitations since last night. Reports that HR "got up in the 90s and that is not normal for me". Patient advising that she began to experience some LEFT side during the night that has worsened today. (+) periods of diaphoresis today.

## 2015-05-11 DIAGNOSIS — R002 Palpitations: Secondary | ICD-10-CM | POA: Diagnosis not present

## 2015-05-11 LAB — TROPONIN I

## 2015-05-11 NOTE — Discharge Instructions (Signed)

## 2015-05-11 NOTE — ED Provider Notes (Signed)
[facility] Emergency Department Provider Note  ____________________________________________  Time seen: 12:25 AM   I have reviewed the triage vital signs and the nursing notes.   HISTORY  Chief Complaint Chest Pain; Palpitations; and Shortness of Breath     HPI Amanda Duke is a 75 y.o. female presents with report of acute onset of palpitations last night. Patient states that her heart rate was in the 90s which is unusual for her. Patient states that she had left-sided chest discomfort at the time of rapid heart rate. Patient admits to diaphoresis and dyspnea along with rapid heart rate.    Past Medical History  Diagnosis Date  . Renal cyst     bilateral  . Prediabetes   . Hyperlipemia   . Hypertension   . GERD (gastroesophageal reflux disease)     Patient Active Problem List   Diagnosis Date Noted  . Family history of breast cancer in first degree relative 02/02/2015  . Family history of ovarian cancer 02/02/2015  . Constipation 02/02/2015  . Weight loss 02/02/2015  . Anxiety 02/02/2015  . Osteoporosis 02/02/2015  . Benign essential HTN 12/16/2014  . Combined fat and carbohydrate induced hyperlipemia 05/19/2014  . Acid reflux 05/19/2014  . Arteriosclerosis of coronary artery 05/19/2014  . Acute chest pain 05/19/2014  . 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 Urge incontinence 11/20/2012  . Renal colic 0000000  . Symptoms involving urinary system 11/20/2012  . Incomplete bladder emptying 11/20/2012  . Frank hematuria 11/20/2012  . Bladder infection, chronic 11/20/2012    Past Surgical History  Procedure Laterality Date  . Tonsillectomy  1948  . Appendectomy  1971  . Tubal ligation  1971  . Arthrodesis  1980  . Back surgery  1980  . Vaginal hysterectomy  1985    d/t heavy bleeding  . Laparoscopic unilateral salpingo oopherectomy  1985   right    Current Outpatient Rx  Name  Route  Sig  Dispense  Refill  . clonazePAM (KLONOPIN) 0.5 MG tablet      TAKE 1 TABLET BY MOUTH AT BEDTIME AS NEEDED FOR INSOMNIA/ANXIETY      1   . diclofenac sodium (VOLTAREN) 1 % GEL      APPLY TO AFFECTED AREA 4 TIMES A DAY AS NEEDED FOR KNEE PAIN      1   . lisinopril (PRINIVIL,ZESTRIL) 10 MG tablet   Oral   Take 10 mg by mouth daily.      2   . omeprazole (PRILOSEC) 40 MG capsule   Oral   Take 40 mg by mouth daily.      0   . rosuvastatin (CRESTOR) 20 MG tablet   Oral   Take 20 mg by mouth daily.      2     Allergies Amoxicillin; Clindamycin; Gemfibrozil; Levofloxacin in d5w; Metoprolol; Sulfa antibiotics; and Nystatin-triamcinolone  Family History  Problem Relation Age of Onset  . Heart failure Mother   . Stroke Mother   . Breast cancer Mother     dx at age 65 yo  . Diabetes Mother   . Emphysema Father   . Ovarian cancer Sister     dx at age 18 yo  . Asthma Sister   . Osteoporosis Mother     Social History Social History  Substance Use Topics  . Smoking status: Former Research scientist (life sciences)  . Smokeless tobacco: Never Used  .  Alcohol Use: No    Review of Systems  Constitutional: Negative for fever. Eyes: Negative for visual changes. ENT: Negative for sore throat. Cardiovascular: Positive for chest pain. Respiratory: Positive for shortness of breath. Gastrointestinal: Negative for abdominal pain, vomiting and diarrhea. Genitourinary: Negative for dysuria. Musculoskeletal: Negative for back pain. Skin: Negative for rash. Neurological: Negative for headaches, focal weakness or numbness.   10-point ROS otherwise negative.  ____________________________________________   PHYSICAL EXAM:  VITAL SIGNS: ED Triage Vitals  Enc Vitals Group     BP 05/10/15 2238 138/64 mmHg     Pulse Rate 05/10/15 2238 75     Resp 05/10/15 2238 16     Temp 05/10/15 2238 97.9 F (36.6 C)     Temp Source 05/10/15 2238 Oral      SpO2 05/10/15 2238 97 %     Weight 05/10/15 2238 180 lb (81.647 kg)     Height 05/10/15 2238 5\' 5"  (1.651 m)     Head Cir --      Peak Flow --      Pain Score 05/10/15 2239 4     Pain Loc --      Pain Edu? --      Excl. in [place]? --      Constitutional: Alert and oriented. Well appearing and in no distress. Eyes: Conjunctivae are normal. PERRL. Normal extraocular movements. ENT   Head: Normocephalic and atraumatic.   Nose: No congestion/rhinnorhea.   Mouth/Throat: Mucous membranes are moist.   Neck: No stridor. Hematological/Lymphatic/Immunilogical: No cervical lymphadenopathy. Cardiovascular: Normal rate, regular rhythm. Normal and symmetric distal pulses are present in all extremities. No murmurs, rubs, or gallops. Respiratory: Normal respiratory effort without tachypnea nor retractions. Breath sounds are clear and equal bilaterally. No wheezes/rales/rhonchi. Gastrointestinal: Soft and nontender. No distention. There is no CVA tenderness. Genitourinary: deferred Musculoskeletal: Nontender with normal range of motion in all extremities. No joint effusions.  No lower extremity tenderness nor edema. Neurologic:  Normal speech and language. No gross focal neurologic deficits are appreciated. Speech is normal.  Skin:  Skin is warm, dry and intact. No rash noted. Psychiatric: Mood and affect are normal. Speech and behavior are normal. Patient exhibits appropriate insight and judgment.  ____________________________________________    LABS (pertinent positives/negatives)  Labs Reviewed  BASIC METABOLIC PANEL - Abnormal; Notable for the following:    Glucose, Bld 100 (*)    Creatinine, Ser 1.11 (*)    GFR calc non Af Amer 48 (*)    GFR calc Af Amer 55 (*)    All other components within normal limits  CBC  TROPONIN I     ____________________________________________   EKG  ED ECG REPORT I, Kamarrion Stfort, [place] N, the attending physician, personally viewed and  interpreted this ECG.   Date: 05/11/2015  EKG Time: 10:38 PM  Rate: 81  Rhythm: Normal sinus rhythm  Axis: None  Intervals: Normal  ST&T Change: None   ____________________________________________    RADIOLOGY  DG Chest 2 View (Final result) Result time: 05/10/15 23:50:12   Final result by Rad Results In Interface (05/10/15 23:50:12)   Narrative:   CLINICAL DATA: Left-sided chest pain, shortness of breath.  EXAM: CHEST 2 VIEW  COMPARISON: May 14, 2014.  FINDINGS: The heart size and mediastinal contours are within normal limits. Both lungs are clear. No pneumothorax or pleural effusion is noted. Stable hiatal hernia is noted. The visualized skeletal structures are unremarkable.  IMPRESSION: Stable hiatal hernia. No acute cardiopulmonary abnormality seen.   Electronically Signed  By: Marijo Conception, M.D. On: 05/10/2015 23:50       INITIAL IMPRESSION / ASSESSMENT AND PLAN / ED COURSE  Pertinent labs & imaging results that were available during my care of the patient were reviewed by me and considered in my medical decision making (see chart for details).  Patient's heart rate ranging from 67 to maximum of 79 while in the emergency department no ectopy noted. Cardiac enzymes negative 2 EKG revealed no evidence of ST segment elevation or depression. We'll refer the patient to Dr. Nehemiah Massed her cardiologist for further evaluation on the outpatient setting  ____________________________________________   FINAL CLINICAL IMPRESSION(S) / ED DIAGNOSES  Final diagnoses:  Heart palpitations      Gregor Hams, MD 05/11/15 [phone]

## 2015-05-12 DIAGNOSIS — Z23 Encounter for immunization: Secondary | ICD-10-CM | POA: Diagnosis not present

## 2015-05-15 DIAGNOSIS — K210 Gastro-esophageal reflux disease with esophagitis: Secondary | ICD-10-CM | POA: Diagnosis not present

## 2015-05-15 DIAGNOSIS — I2510 Atherosclerotic heart disease of native coronary artery without angina pectoris: Secondary | ICD-10-CM | POA: Diagnosis not present

## 2015-05-15 DIAGNOSIS — R079 Chest pain, unspecified: Secondary | ICD-10-CM | POA: Diagnosis not present

## 2015-05-15 DIAGNOSIS — I10 Essential (primary) hypertension: Secondary | ICD-10-CM | POA: Diagnosis not present

## 2015-05-15 DIAGNOSIS — R002 Palpitations: Secondary | ICD-10-CM | POA: Diagnosis not present

## 2015-05-15 DIAGNOSIS — E782 Mixed hyperlipidemia: Secondary | ICD-10-CM | POA: Diagnosis not present

## 2015-05-18 DIAGNOSIS — R079 Chest pain, unspecified: Secondary | ICD-10-CM | POA: Diagnosis not present

## 2015-05-23 DIAGNOSIS — I2510 Atherosclerotic heart disease of native coronary artery without angina pectoris: Secondary | ICD-10-CM | POA: Diagnosis not present

## 2015-05-23 DIAGNOSIS — I471 Supraventricular tachycardia, unspecified: Secondary | ICD-10-CM | POA: Insufficient documentation

## 2015-05-23 DIAGNOSIS — E782 Mixed hyperlipidemia: Secondary | ICD-10-CM | POA: Diagnosis not present

## 2015-05-23 DIAGNOSIS — I10 Essential (primary) hypertension: Secondary | ICD-10-CM | POA: Diagnosis not present

## 2015-06-20 DIAGNOSIS — Z634 Disappearance and death of family member: Secondary | ICD-10-CM | POA: Diagnosis not present

## 2015-06-20 DIAGNOSIS — N181 Chronic kidney disease, stage 1: Secondary | ICD-10-CM | POA: Diagnosis not present

## 2015-06-20 DIAGNOSIS — I10 Essential (primary) hypertension: Secondary | ICD-10-CM | POA: Diagnosis not present

## 2015-06-20 DIAGNOSIS — R002 Palpitations: Secondary | ICD-10-CM | POA: Diagnosis not present

## 2015-08-03 ENCOUNTER — Ambulatory Visit: Payer: 59 | Admitting: Obstetrics and Gynecology

## 2015-09-12 DIAGNOSIS — K210 Gastro-esophageal reflux disease with esophagitis: Secondary | ICD-10-CM | POA: Diagnosis not present

## 2015-09-12 DIAGNOSIS — I10 Essential (primary) hypertension: Secondary | ICD-10-CM | POA: Diagnosis not present

## 2015-09-12 DIAGNOSIS — I471 Supraventricular tachycardia: Secondary | ICD-10-CM | POA: Diagnosis not present

## 2015-09-12 DIAGNOSIS — I2510 Atherosclerotic heart disease of native coronary artery without angina pectoris: Secondary | ICD-10-CM | POA: Diagnosis not present

## 2015-09-12 DIAGNOSIS — E782 Mixed hyperlipidemia: Secondary | ICD-10-CM | POA: Diagnosis not present

## 2015-10-12 ENCOUNTER — Encounter: Payer: Self-pay | Admitting: *Deleted

## 2015-10-12 ENCOUNTER — Emergency Department
Admission: EM | Admit: 2015-10-12 | Discharge: 2015-10-13 | Disposition: A | Discharge status: Home / Self Care | Payer: Medicare Other | Attending: Emergency Medicine | Admitting: Emergency Medicine

## 2015-10-12 DIAGNOSIS — Z79899 Other long term (current) drug therapy: Secondary | ICD-10-CM | POA: Insufficient documentation

## 2015-10-12 DIAGNOSIS — R300 Dysuria: Secondary | ICD-10-CM | POA: Diagnosis present

## 2015-10-12 DIAGNOSIS — N390 Urinary tract infection, site not specified: Secondary | ICD-10-CM | POA: Diagnosis not present

## 2015-10-12 DIAGNOSIS — E785 Hyperlipidemia, unspecified: Secondary | ICD-10-CM | POA: Diagnosis not present

## 2015-10-12 DIAGNOSIS — I10 Essential (primary) hypertension: Secondary | ICD-10-CM | POA: Diagnosis not present

## 2015-10-12 DIAGNOSIS — Z87891 Personal history of nicotine dependence: Secondary | ICD-10-CM | POA: Insufficient documentation

## 2015-10-12 DIAGNOSIS — I2510 Atherosclerotic heart disease of native coronary artery without angina pectoris: Secondary | ICD-10-CM | POA: Diagnosis not present

## 2015-10-12 LAB — CBC WITH DIFFERENTIAL/PLATELET
Basophils Absolute: 0.1 10*3/uL (ref 0–0.1)
Basophils Relative: 1 %
Eosinophils Absolute: 0.5 10*3/uL (ref 0–0.7)
Eosinophils Relative: 5 %
HEMATOCRIT: 43.4 % (ref 35.0–47.0)
HEMOGLOBIN: 14.8 g/dL (ref 12.0–16.0)
LYMPHS ABS: 1.2 10*3/uL (ref 1.0–3.6)
Lymphocytes Relative: 13 %
MCH: 29.7 pg (ref 26.0–34.0)
MCHC: 34.1 g/dL (ref 32.0–36.0)
MCV: 87.2 fL (ref 80.0–100.0)
MONOS PCT: 9 %
Monocytes Absolute: 0.8 10*3/uL (ref 0.2–0.9)
NEUTROS ABS: 6.8 10*3/uL — AB (ref 1.4–6.5)
NEUTROS PCT: 72 %
Platelets: 237 10*3/uL (ref 150–440)
RBC: 4.97 MIL/uL (ref 3.80–5.20)
RDW: 14.4 % (ref 11.5–14.5)
WBC: 9.3 10*3/uL (ref 3.6–11.0)

## 2015-10-12 LAB — URINALYSIS COMPLETE WITH MICROSCOPIC (ARMC ONLY)
BACTERIA UA: NONE SEEN
BILIRUBIN URINE: NEGATIVE
GLUCOSE, UA: NEGATIVE mg/dL
HGB URINE DIPSTICK: NEGATIVE
Ketones, ur: NEGATIVE mg/dL
NITRITE: NEGATIVE
Protein, ur: NEGATIVE mg/dL
Specific Gravity, Urine: 1.002 — ABNORMAL LOW (ref 1.005–1.030)
Squamous Epithelial / LPF: NONE SEEN
pH: 8 (ref 5.0–8.0)

## 2015-10-12 LAB — COMPREHENSIVE METABOLIC PANEL
ALK PHOS: 85 U/L (ref 38–126)
ALT: 19 U/L (ref 14–54)
ANION GAP: 10 (ref 5–15)
AST: 28 U/L (ref 15–41)
Albumin: 4.8 g/dL (ref 3.5–5.0)
BILIRUBIN TOTAL: 0.6 mg/dL (ref 0.3–1.2)
BUN: 10 mg/dL (ref 6–20)
CALCIUM: 9.6 mg/dL (ref 8.9–10.3)
CO2: 25 mmol/L (ref 22–32)
Chloride: 104 mmol/L (ref 101–111)
Creatinine, Ser: 1.22 mg/dL — ABNORMAL HIGH (ref 0.44–1.00)
GFR calc non Af Amer: 43 mL/min — ABNORMAL LOW (ref >60)
GFR, EST AFRICAN AMERICAN: 49 mL/min — AB (ref >60)
Glucose, Bld: 108 mg/dL — ABNORMAL HIGH (ref 65–99)
Potassium: 3.9 mmol/L (ref 3.5–5.1)
Sodium: 139 mmol/L (ref 135–145)
TOTAL PROTEIN: 8.3 g/dL — AB (ref 6.5–8.1)

## 2015-10-12 MED ORDER — CIPROFLOXACIN HCL 500 MG PO TABS
500.0000 mg | ORAL_TABLET | Freq: Once | ORAL | Status: AC
  Administered 2015-10-13: 500 mg via ORAL
  Filled 2015-10-12: qty 1

## 2015-10-12 MED ORDER — CIPROFLOXACIN HCL 500 MG PO TABS: 500.0000 mg | ORAL_TABLET | Freq: Two times a day (BID) | ORAL | Status: AC

## 2015-10-12 NOTE — ED Notes (Addendum)
Pt reports she has a UTI and is taking microbid for 7 days.  States today frequent urination , fever and chills.  No n/v/d.  Pt alert.  Speech clear.

## 2015-10-12 NOTE — ED Provider Notes (Signed)
[facility] Emergency Department Provider Note  ____________________________________________  Time seen: 11:30 PM  I have reviewed the triage vital signs and the nursing notes.   HISTORY  Chief Complaint Dysuria     HPI Amanda Duke is a 75 y.o. female presents with recent diagnosis of urinary tract infection. Patient states that she's had urinary frequency urgency 7 days as well as fever noted today MAXIMUM TEMPERATURE 99.4. Patient also admits to chills. Patient denies any vomiting.     Past Medical History  Diagnosis Date  . Renal cyst     bilateral  . Prediabetes   . Hyperlipemia   . Hypertension   . GERD (gastroesophageal reflux disease)     Patient Active Problem List   Diagnosis Date Noted  . Family history of breast cancer in first degree relative 02/02/2015  . Family history of ovarian cancer 02/02/2015  . Constipation 02/02/2015  . Weight loss 02/02/2015  . Anxiety 02/02/2015  . Osteoporosis 02/02/2015  . Benign essential HTN 12/16/2014  . Combined fat and carbohydrate induced hyperlipemia 05/19/2014  . Acid reflux 05/19/2014  . Arteriosclerosis of coronary artery 05/19/2014  . Acute chest pain 05/19/2014  . 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 Urge incontinence 11/20/2012  . Renal colic 0000000  . Symptoms involving urinary system 11/20/2012  . Incomplete bladder emptying 11/20/2012  . Frank hematuria 11/20/2012  . Bladder infection, chronic 11/20/2012    Past Surgical History  Procedure Laterality Date  . Tonsillectomy  1948  . Appendectomy  1971  . Tubal ligation  1971  . Arthrodesis  1980  . Back surgery  1980  . Vaginal hysterectomy  1985    d/t heavy bleeding  . Laparoscopic unilateral salpingo oopherectomy  1985    right    Current Outpatient Rx  Name  Route  Sig  Dispense  Refill  . ciprofloxacin (CIPRO) 500 MG  tablet   Oral   Take 1 tablet (500 mg total) by mouth 2 (two) times daily.   10 tablet   0   . clonazePAM (KLONOPIN) 0.5 MG tablet      TAKE 1 TABLET BY MOUTH AT BEDTIME AS NEEDED FOR INSOMNIA/ANXIETY      1   . lisinopril (PRINIVIL,ZESTRIL) 10 MG tablet   Oral   Take 10 mg by mouth daily.      2   . omeprazole (PRILOSEC) 40 MG capsule   Oral   Take 40 mg by mouth daily.      0   . oxybutynin (DITROPAN) 5 MG tablet   Oral   Take 1 tablet by mouth 3 (three) times daily.      11   . rosuvastatin (CRESTOR) 20 MG tablet   Oral   Take 20 mg by mouth daily.      2     Allergies Amoxicillin; Clindamycin; Gemfibrozil; Levofloxacin in d5w; Metoprolol; Sulfa antibiotics; and Nystatin-triamcinolone  Family History  Problem Relation Age of Onset  . Heart failure Mother   . Stroke Mother   . Breast cancer Mother     dx at age 90 yo  . Diabetes Mother   . Emphysema Father   . Ovarian cancer Sister     dx at age 52 yo  . Asthma Sister   . Osteoporosis Mother     Social History Social History  Substance Use Topics  . Smoking status: Former Research scientist (life sciences)  .  Smokeless tobacco: Never Used  . Alcohol Use: No    Review of Systems  Constitutional: Negative for fever. Eyes: Negative for visual changes. ENT: Negative for sore throat. Cardiovascular: Negative for chest pain. Respiratory: Negative for shortness of breath. Gastrointestinal: Negative for abdominal pain, vomiting and diarrhea. Genitourinary: Positive for dysuria. Musculoskeletal: Negative for back pain. Skin: Negative for rash. Neurological: Negative for headaches, focal weakness or numbness.   10-point ROS otherwise negative.  ____________________________________________   PHYSICAL EXAM:  VITAL SIGNS: ED Triage Vitals  Enc Vitals Group     BP 10/12/15 2053 172/83 mmHg     Pulse Rate 10/12/15 2053 79     Resp 10/12/15 2053 20     Temp 10/12/15 2053 99 F (37.2 C)     Temp Source 10/12/15 2053  Oral     SpO2 10/12/15 2053 98 %     Weight 10/12/15 2053 185 lb (83.915 kg)     Height 10/12/15 2053 5\' 6"  (1.676 m)     Head Cir --      Peak Flow --      Pain Score 10/12/15 2054 5     Pain Loc --      Pain Edu? --      Excl. in [place]? --      Constitutional: Alert and oriented. Well appearing and in no distress. Eyes: Conjunctivae are normal. PERRL. Normal extraocular movements. ENT   Head: Normocephalic and atraumatic.   Nose: No congestion/rhinnorhea.   Mouth/Throat: Mucous membranes are moist.   Neck: No stridor. Hematological/Lymphatic/Immunilogical: No cervical lymphadenopathy. Cardiovascular: Normal rate, regular rhythm. Normal and symmetric distal pulses are present in all extremities. No murmurs, rubs, or gallops. Respiratory: Normal respiratory effort without tachypnea nor retractions. Breath sounds are clear and equal bilaterally. No wheezes/rales/rhonchi. Gastrointestinal: Soft and nontender. No distention. There is no CVA tenderness. Genitourinary: deferred Musculoskeletal: Nontender with normal range of motion in all extremities. No joint effusions.  No lower extremity tenderness nor edema. Neurologic:  Normal speech and language. No gross focal neurologic deficits are appreciated. Speech is normal.  Skin:  Skin is warm, dry and intact. No rash noted. Psychiatric: Mood and affect are normal. Speech and behavior are normal. Patient exhibits appropriate insight and judgment.  ____________________________________________    LABS (pertinent positives/negatives)  Labs Reviewed  URINALYSIS COMPLETEWITH MICROSCOPIC ([place]) - Abnormal; Notable for the following:    Color, Urine COLORLESS (*)    APPearance CLEAR (*)    Specific Gravity, Urine 1.002 (*)    Leukocytes, UA 1+ (*)    All other components within normal limits  CBC WITH DIFFERENTIAL/PLATELET - Abnormal; Notable for the following:    Neutro Abs 6.8 (*)    All other components within normal  limits  COMPREHENSIVE METABOLIC PANEL - Abnormal; Notable for the following:    Glucose, Bld 108 (*)    Creatinine, Ser 1.22 (*)    Total Protein 8.3 (*)    GFR calc non Af Amer 43 (*)    GFR calc Af Amer 49 (*)    All other components within normal limits  URINE CULTURE       INITIAL IMPRESSION / ASSESSMENT AND PLAN / ED COURSE  Pertinent labs & imaging results that were available during my care of the patient were reviewed by me and considered in my medical decision making (see chart for details).  Patient given Cipro in emergency department she states that she's taken this in the past with good results for urinary  tract infections. Urine culture was obtained and will await those results.  ____________________________________________   FINAL CLINICAL IMPRESSION(S) / ED DIAGNOSES  Final diagnoses:  UTI (lower urinary tract infection)      Gregor Hams, MD 10/13/15 [phone]

## 2015-10-12 NOTE — Discharge Instructions (Signed)

## 2015-10-13 DIAGNOSIS — N390 Urinary tract infection, site not specified: Secondary | ICD-10-CM | POA: Diagnosis not present

## 2015-10-13 MED ORDER — ONDANSETRON 4 MG PO TBDP
4.0000 mg | ORAL_TABLET | Freq: Once | ORAL | Status: AC
  Administered 2015-10-13: 4 mg via ORAL

## 2015-10-13 MED ORDER — ONDANSETRON 4 MG PO TBDP
ORAL_TABLET | ORAL | Status: AC
  Administered 2015-10-13: 4 mg via ORAL
  Filled 2015-10-13: qty 1

## 2015-10-14 LAB — URINE CULTURE

## 2015-11-23 ENCOUNTER — Telehealth: Payer: Self-pay | Admitting: Obstetrics and Gynecology

## 2015-11-23 NOTE — Telephone Encounter (Signed)
Pt called and she thought she needed to schedule an appt, she no showed her appt in April that was a 12 month f/u family h/o ovarian cancer, she said on the phone she normally has an [place] and then sees him, but I didn't want to schedule anything since she missed her appt back in April, will you let me know what I need to schedule for her, I told her I needed to check with you and that I would give her a call back once I know what to schedule.

## 2015-11-24 NOTE — Telephone Encounter (Signed)
CALLED PATIENT AND LET HER KNOW SHE HAS HER APPT SET UP FOR 8/31

## 2015-11-30 DIAGNOSIS — N181 Chronic kidney disease, stage 1: Secondary | ICD-10-CM | POA: Diagnosis not present

## 2015-11-30 DIAGNOSIS — Z0001 Encounter for general adult medical examination with abnormal findings: Secondary | ICD-10-CM | POA: Diagnosis not present

## 2015-11-30 DIAGNOSIS — I10 Essential (primary) hypertension: Secondary | ICD-10-CM | POA: Diagnosis not present

## 2015-11-30 DIAGNOSIS — I2510 Atherosclerotic heart disease of native coronary artery without angina pectoris: Secondary | ICD-10-CM | POA: Diagnosis not present

## 2015-11-30 DIAGNOSIS — R7301 Impaired fasting glucose: Secondary | ICD-10-CM | POA: Diagnosis not present

## 2015-11-30 DIAGNOSIS — N281 Cyst of kidney, acquired: Secondary | ICD-10-CM | POA: Diagnosis not present

## 2015-12-14 DIAGNOSIS — M8588 Other specified disorders of bone density and structure, other site: Secondary | ICD-10-CM | POA: Diagnosis not present

## 2015-12-14 DIAGNOSIS — M810 Age-related osteoporosis without current pathological fracture: Secondary | ICD-10-CM | POA: Diagnosis not present

## 2015-12-14 DIAGNOSIS — M85861 Other specified disorders of bone density and structure, right lower leg: Secondary | ICD-10-CM | POA: Diagnosis not present

## 2015-12-15 DIAGNOSIS — H2513 Age-related nuclear cataract, bilateral: Secondary | ICD-10-CM | POA: Diagnosis not present

## 2015-12-28 ENCOUNTER — Ambulatory Visit (INDEPENDENT_AMBULATORY_CARE_PROVIDER_SITE_OTHER): Payer: Medicare Other | Admitting: Obstetrics and Gynecology

## 2015-12-28 ENCOUNTER — Encounter: Payer: Self-pay | Admitting: Obstetrics and Gynecology

## 2015-12-28 VITALS — BP 154/85 | HR 71 | Ht 67.0 in | Wt 185.9 lb

## 2015-12-28 DIAGNOSIS — Z803 Family history of malignant neoplasm of breast: Secondary | ICD-10-CM | POA: Diagnosis not present

## 2015-12-28 DIAGNOSIS — Z90710 Acquired absence of both cervix and uterus: Secondary | ICD-10-CM | POA: Diagnosis not present

## 2015-12-28 DIAGNOSIS — Z8041 Family history of malignant neoplasm of ovary: Secondary | ICD-10-CM | POA: Diagnosis not present

## 2015-12-28 NOTE — Patient Instructions (Addendum)
1. Pelvic ultrasound is ordered. 2. BRCA1/BRCA2  testing is ordered 3. Results will be made available from tests

## 2015-12-29 ENCOUNTER — Ambulatory Visit (INDEPENDENT_AMBULATORY_CARE_PROVIDER_SITE_OTHER): Payer: Medicare Other

## 2015-12-29 DIAGNOSIS — Z803 Family history of malignant neoplasm of breast: Secondary | ICD-10-CM

## 2015-12-29 DIAGNOSIS — Z8041 Family history of malignant neoplasm of ovary: Secondary | ICD-10-CM | POA: Diagnosis not present

## 2015-12-29 DIAGNOSIS — Z90710 Acquired absence of both cervix and uterus: Secondary | ICD-10-CM

## 2016-01-02 NOTE — Progress Notes (Signed)
Chief complaint: 1. Family history of breast cancer (mother age 75) 25. Family history ovarian cancer (sister age 68) 23. Six-month follow-up  19 year old gravida 26 para 75 female presents for 6 month interval follow-up on above issues. Ultrasound performed 6 months ago demonstrated nonvisualization of the ovaries and no adnexal masses Patient reports no significant abdominal pain, change in bowel or bladder function, or constitutional symptoms suggestive of ovarian cancer. She does have some mild anxiety regarding bleeding to her strong family history of breast and ovarian cancers.  Gynecologic History No LMP recorded. Patient has had a hysterectomy. TVH 1985 secondary to menorrhagia Contraception:TVH; menopause Last Pap:  2002, normal.  Last mammogram: 06/2012 normal Osteoporosis  Past medical history, past surgical history, problem list, medications, and allergies are reviewed  OBJECTIVE: BP (!) 154/85   Pulse 71   Ht '5\' 7"'$  (1.702 m)   Wt 185 lb 14.4 oz (84.3 kg)   BMI 29.12 kg/m  Pleasant white female in no acute distress. She is alert and oriented. Affect is appropriate. Abdomen: Soft, nontender without organomegaly. No pelvic masses. Pelvic exam: External genitalia normal BUS normal Vagina-decreased estrogen effect Cervix-surgically absent Uterus-surgically absent Bimanual exam-oh masses or tenderness lymphadenopathy: no inguinal lymph nodes  ASSESSMENT: 1. Family history of breast cancer 2. Family history of ovarian cancer 3. Status post transvaginal hysterectomy  PLAN: 1. Pelvic ultrasound 2. BRCA1/BRCA2 genetic testing is ordered 3. Results from studies will be made available to the patient for further management planning 4. Return in 6 months for follow-up  A total of 15 minutes were spent face-to-face with the patient during this encounter and over half of that time dealt with counseling and coordination of care.  Brayton Mars, MD  Note: This  dictation was prepared with Dragon dictation along with smaller phrase technology. Any transcriptional errors that result from this process are unintentional.

## 2016-02-23 ENCOUNTER — Other Ambulatory Visit
Admission: RE | Admit: 2016-02-23 | Discharge: 2016-02-23 | Disposition: A | Discharge status: Home / Self Care | Payer: Medicare Other | Source: Ambulatory Visit | Attending: Physician Assistant | Admitting: Physician Assistant

## 2016-02-23 DIAGNOSIS — E559 Vitamin D deficiency, unspecified: Secondary | ICD-10-CM | POA: Insufficient documentation

## 2016-02-23 DIAGNOSIS — E782 Mixed hyperlipidemia: Secondary | ICD-10-CM | POA: Insufficient documentation

## 2016-02-23 DIAGNOSIS — N182 Chronic kidney disease, stage 2 (mild): Secondary | ICD-10-CM | POA: Diagnosis not present

## 2016-02-23 DIAGNOSIS — Z0001 Encounter for general adult medical examination with abnormal findings: Secondary | ICD-10-CM | POA: Diagnosis not present

## 2016-02-23 LAB — COMPREHENSIVE METABOLIC PANEL
ALBUMIN: 4.1 g/dL (ref 3.5–5.0)
ALK PHOS: 84 U/L (ref 38–126)
ALT: 16 U/L (ref 14–54)
AST: 23 U/L (ref 15–41)
Anion gap: 8 (ref 5–15)
BILIRUBIN TOTAL: 0.7 mg/dL (ref 0.3–1.2)
BUN: 12 mg/dL (ref 6–20)
CO2: 27 mmol/L (ref 22–32)
Calcium: 9.4 mg/dL (ref 8.9–10.3)
Chloride: 104 mmol/L (ref 101–111)
Creatinine, Ser: 0.96 mg/dL (ref 0.44–1.00)
GFR calc Af Amer: >60 mL/min (ref >60)
GFR, EST NON AFRICAN AMERICAN: 56 mL/min — AB (ref >60)
GLUCOSE: 105 mg/dL — AB (ref 65–99)
POTASSIUM: 3.8 mmol/L (ref 3.5–5.1)
Sodium: 139 mmol/L (ref 135–145)
TOTAL PROTEIN: 7.6 g/dL (ref 6.5–8.1)

## 2016-02-23 LAB — CBC WITH DIFFERENTIAL/PLATELET
Basophils Absolute: 0.1 10*3/uL (ref 0–0.1)
Basophils Relative: 1 %
EOS ABS: 0.1 10*3/uL (ref 0–0.7)
Eosinophils Relative: 3 %
HEMATOCRIT: 45.6 % (ref 35.0–47.0)
HEMOGLOBIN: 15.2 g/dL (ref 12.0–16.0)
LYMPHS ABS: 1.8 10*3/uL (ref 1.0–3.6)
LYMPHS PCT: 34 %
MCH: 29.3 pg (ref 26.0–34.0)
MCHC: 33.3 g/dL (ref 32.0–36.0)
MCV: 87.9 fL (ref 80.0–100.0)
MONOS PCT: 10 %
Monocytes Absolute: 0.6 10*3/uL (ref 0.2–0.9)
NEUTROS ABS: 2.8 10*3/uL (ref 1.4–6.5)
NEUTROS PCT: 52 %
Platelets: 231 10*3/uL (ref 150–440)
RBC: 5.18 MIL/uL (ref 3.80–5.20)
RDW: 14.6 % — ABNORMAL HIGH (ref 11.5–14.5)
WBC: 5.4 10*3/uL (ref 3.6–11.0)

## 2016-02-23 LAB — LIPID PANEL
CHOL/HDL RATIO: 5.6 ratio
CHOLESTEROL: 236 mg/dL — AB (ref 0–200)
HDL: 42 mg/dL (ref >40)
LDL Cholesterol: 157 mg/dL — ABNORMAL HIGH (ref 0–99)
Triglycerides: 183 mg/dL — ABNORMAL HIGH (ref <150)
VLDL: 37 mg/dL (ref 0–40)

## 2016-02-23 LAB — TSH: TSH: 1.689 u[IU]/mL (ref 0.350–4.500)

## 2016-02-24 LAB — VITAMIN D 25 HYDROXY (VIT D DEFICIENCY, FRACTURES): Vit D, 25-Hydroxy: 20.1 ng/mL — ABNORMAL LOW (ref 30.0–100.0)

## 2016-02-24 LAB — T4: T4 TOTAL: 7.4 ug/dL (ref 4.5–12.0)

## 2016-02-24 LAB — HEMOGLOBIN A1C
HEMOGLOBIN A1C: 5.7 % — AB (ref 4.8–5.6)
MEAN PLASMA GLUCOSE: 117 mg/dL

## 2016-03-11 ENCOUNTER — Encounter: Payer: Self-pay | Admitting: Obstetrics and Gynecology

## 2016-03-14 DIAGNOSIS — N2889 Other specified disorders of kidney and ureter: Secondary | ICD-10-CM | POA: Diagnosis not present

## 2016-03-14 DIAGNOSIS — K760 Fatty (change of) liver, not elsewhere classified: Secondary | ICD-10-CM | POA: Diagnosis not present

## 2016-03-14 DIAGNOSIS — R339 Retention of urine, unspecified: Secondary | ICD-10-CM | POA: Diagnosis not present

## 2016-03-14 DIAGNOSIS — N281 Cyst of kidney, acquired: Secondary | ICD-10-CM | POA: Diagnosis not present

## 2016-03-14 DIAGNOSIS — N3020 Other chronic cystitis without hematuria: Secondary | ICD-10-CM | POA: Diagnosis not present

## 2016-03-15 DIAGNOSIS — J069 Acute upper respiratory infection, unspecified: Secondary | ICD-10-CM | POA: Diagnosis not present

## 2016-03-18 DIAGNOSIS — J029 Acute pharyngitis, unspecified: Secondary | ICD-10-CM | POA: Diagnosis not present

## 2016-03-18 DIAGNOSIS — E782 Mixed hyperlipidemia: Secondary | ICD-10-CM | POA: Diagnosis not present

## 2016-03-18 DIAGNOSIS — I10 Essential (primary) hypertension: Secondary | ICD-10-CM | POA: Diagnosis not present

## 2016-03-18 DIAGNOSIS — G4700 Insomnia, unspecified: Secondary | ICD-10-CM | POA: Diagnosis not present

## 2016-04-11 ENCOUNTER — Emergency Department: Payer: Medicare Other

## 2016-04-11 ENCOUNTER — Encounter: Payer: Self-pay | Admitting: Emergency Medicine

## 2016-04-11 ENCOUNTER — Emergency Department
Admission: EM | Admit: 2016-04-11 | Discharge: 2016-04-11 | Disposition: A | Discharge status: Home / Self Care | Payer: Medicare Other | Attending: Emergency Medicine | Admitting: Emergency Medicine

## 2016-04-11 DIAGNOSIS — R05 Cough: Secondary | ICD-10-CM | POA: Diagnosis not present

## 2016-04-11 DIAGNOSIS — Z79899 Other long term (current) drug therapy: Secondary | ICD-10-CM | POA: Insufficient documentation

## 2016-04-11 DIAGNOSIS — R0789 Other chest pain: Secondary | ICD-10-CM | POA: Diagnosis not present

## 2016-04-11 DIAGNOSIS — Z87891 Personal history of nicotine dependence: Secondary | ICD-10-CM | POA: Insufficient documentation

## 2016-04-11 DIAGNOSIS — I10 Essential (primary) hypertension: Secondary | ICD-10-CM | POA: Diagnosis not present

## 2016-04-11 DIAGNOSIS — I2510 Atherosclerotic heart disease of native coronary artery without angina pectoris: Secondary | ICD-10-CM | POA: Diagnosis not present

## 2016-04-11 LAB — CBC WITH DIFFERENTIAL/PLATELET
BASOS ABS: 0.1 10*3/uL (ref 0–0.1)
BASOS PCT: 1 %
Eosinophils Absolute: 0.1 10*3/uL (ref 0–0.7)
Eosinophils Relative: 1 %
HEMATOCRIT: 45.4 % (ref 35.0–47.0)
HEMOGLOBIN: 15.4 g/dL (ref 12.0–16.0)
Lymphocytes Relative: 24 %
Lymphs Abs: 2 10*3/uL (ref 1.0–3.6)
MCH: 29.7 pg (ref 26.0–34.0)
MCHC: 34 g/dL (ref 32.0–36.0)
MCV: 87.4 fL (ref 80.0–100.0)
MONOS PCT: 8 %
Monocytes Absolute: 0.7 10*3/uL (ref 0.2–0.9)
NEUTROS ABS: 5.3 10*3/uL (ref 1.4–6.5)
NEUTROS PCT: 66 %
Platelets: 288 10*3/uL (ref 150–440)
RBC: 5.19 MIL/uL (ref 3.80–5.20)
RDW: 14 % (ref 11.5–14.5)
WBC: 8 10*3/uL (ref 3.6–11.0)

## 2016-04-11 LAB — BASIC METABOLIC PANEL
ANION GAP: 10 (ref 5–15)
BUN: 12 mg/dL (ref 6–20)
CALCIUM: 9.5 mg/dL (ref 8.9–10.3)
CO2: 24 mmol/L (ref 22–32)
Chloride: 104 mmol/L (ref 101–111)
Creatinine, Ser: 1.09 mg/dL — ABNORMAL HIGH (ref 0.44–1.00)
GFR calc non Af Amer: 48 mL/min — ABNORMAL LOW (ref >60)
GFR, EST AFRICAN AMERICAN: 56 mL/min — AB (ref >60)
Glucose, Bld: 99 mg/dL (ref 65–99)
Potassium: 3.6 mmol/L (ref 3.5–5.1)
SODIUM: 138 mmol/L (ref 135–145)

## 2016-04-11 LAB — TROPONIN I

## 2016-04-11 NOTE — Discharge Instructions (Signed)
You were evaluated for elevated blood pressures, which was decreasing after you to cure additional blood pressure medicine by the time he got here in emergency department.  He was evaluated also due to chest pressure and your exam and evaluation are reassuring in the emergency room today.  As we discussed, I do recommend following up with cardiologist, call Dr. Tyrell Antonio office tomorrow.  Return to the emergency room for any worsening condition including chest pain, chest pressure, headache, weakness, numbness, confusion or altered mental status, nausea, sweats, or any other symptoms concerning to you.

## 2016-04-11 NOTE — ED Provider Notes (Signed)
[facility] Emergency Department Provider Note ____________________________________________   I have reviewed the triage vital signs and the triage nursing note.  HISTORY  Chief Complaint Hypertension   Historian Patient  HPI Amanda Duke is a 75 y.o. female who follows with Dr. Clayborn Bigness, for elevated blood pressure and was recently told to add new bp med (losartan?) to diltiazem.  She states this was due to several elevated blood pressures in the office. Although she's had the prescription for a while now, she had not started because she was afraid to start another medication. Over the last several days she has felt flushed and had intermittent episodes of chest pressure without nausea, or extension or frank chest pain, and states that she took her blood pressure and over the last 1-2 days it was around 200/130.  This morning because of the elevated blood pressure she did go ahead and take the second/new blood pressure medication. She states that the last time she had a chest pressure episode was just a few minutes ago in the emergency department.  She states that she's never had diagnosis of coronary artery disease, however she had a respiratory illness a few weeks ago and she feels like she is not fully really recovered from that.    Past Medical History:  Diagnosis Date  . GERD (gastroesophageal reflux disease)   . Hyperlipemia   . Hypertension   . Irregular heart beat   . Prediabetes   . Renal cyst    bilateral    Patient Active Problem List   Diagnosis Date Noted  . Family history of breast cancer in first degree relative 02/02/2015  . Family history of ovarian cancer 02/02/2015  . Constipation 02/02/2015  . Weight loss 02/02/2015  . Anxiety 02/02/2015  . Osteoporosis 02/02/2015  . Benign essential HTN 12/16/2014  . Combined fat and carbohydrate induced hyperlipemia 05/19/2014  . Acid reflux 05/19/2014  . Arteriosclerosis of coronary artery  05/19/2014  . Acute chest pain 05/19/2014  . 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 Urge incontinence 11/20/2012  . Renal colic 0000000  . Symptoms involving urinary system 11/20/2012  . Incomplete bladder emptying 11/20/2012  . Frank hematuria 11/20/2012  . Bladder infection, chronic 11/20/2012    Past Surgical History:  Procedure Laterality Date  . APPENDECTOMY  1971  . ARTHRODESIS  1980  . BACK SURGERY  1980  . [place]   right  . TONSILLECTOMY  1948  . TUBAL LIGATION  1971  . VAGINAL HYSTERECTOMY  1985   d/t heavy bleeding    Prior to Admission medications   Medication Sig Start Date End Date Taking? Authorizing Provider  cetirizine (ZYRTEC) 10 MG tablet Take 10 mg by mouth daily.   Yes Historical Provider, MD  cholecalciferol (VITAMIN D) 1000 units tablet Take 1,000 Units by mouth daily.   Yes Historical Provider, MD  clonazePAM (KLONOPIN) 0.5 MG tablet Take 0.25 mg by mouth 2 (two) times daily as needed for anxiety.   Yes Historical Provider, MD  diltiazem (CARDIZEM CD) 120 MG 24 hr capsule Take 120 mg by mouth daily.  05/23/15 05/22/16 Yes Historical Provider, MD  losartan (COZAAR) 25 MG tablet Take 25 mg by mouth daily.   Yes Historical Provider, MD  Omega-3 1000 MG CAPS Take 1 capsule by mouth 2 (two) times daily.  09/12/15 09/11/16 Yes Historical Provider, MD  omeprazole ([place])  40 MG capsule Take 40 mg by mouth every other day.  01/05/15  Yes Historical Provider, MD  rosuvastatin (CRESTOR) 20 MG tablet Take 20 mg by mouth daily. 12/31/14  Yes Historical Provider, MD    Allergies  Allergen Reactions  . Amoxicillin Hives  . Clindamycin Diarrhea  . Gemfibrozil Other (See Comments)    muscle ache  . Levofloxacin Other (See Comments)  . Levofloxacin In D5w Other (See Comments)  . Metoprolol Other (See Comments)  . Simvastatin Other (See  Comments)    muscle ache  . Sulfa Antibiotics Other (See Comments)  . Nystatin-Triamcinolone Rash    Family History  Problem Relation Age of Onset  . Heart failure Mother   . Stroke Mother   . Breast cancer Mother     dx at age 7 yo  . Diabetes Mother   . Osteoporosis Mother   . Emphysema Father   . Ovarian cancer Sister     dx at age 25 yo  . Asthma Sister     Social History Social History  Substance Use Topics  . Smoking status: Former Research scientist (life sciences)  . Smokeless tobacco: Never Used  . Alcohol use No    Review of Systems  Constitutional: Negative for fever. Eyes: Negative for visual changes. ENT: Negative for sore throat. Cardiovascular: Negative for chest pain, but positive for some chest pressure as per history of present illness. Respiratory: She's not sure if she really wants to call shortness of breath, but she just got over a cough and cold and still doesn't feel like her lungs back to normal. Gastrointestinal: Negative for abdominal pain, vomiting and diarrhea. Genitourinary: Negative for dysuria. Musculoskeletal: Negative for back pain. Skin: Negative for rash. Neurological: Negative for headache. 10 point Review of Systems otherwise negative ____________________________________________   PHYSICAL EXAM:  VITAL SIGNS: ED Triage Vitals  Enc Vitals Group     BP 04/11/16 1122 (!) 171/79     Pulse Rate 04/11/16 1122 78     Resp 04/11/16 1122 18     Temp 04/11/16 1122 97.8 F (36.6 C)     Temp Source 04/11/16 1122 Oral     SpO2 04/11/16 1122 99 %     Weight 04/11/16 1122 185 lb (83.9 kg)     Height 04/11/16 1122 5\' 5"  (1.651 m)     Head Circumference --      Peak Flow --      Pain Score 04/11/16 1123 5     Pain Loc --      Pain Edu? --      Excl. in [place]? --      Constitutional: Alert and oriented. Well appearing and in no distress. HEENT   Head: Normocephalic and atraumatic.      Eyes: Conjunctivae are normal. PERRL. Normal extraocular movements.       Ears:         Nose: No congestion/rhinnorhea.   Mouth/Throat: Mucous membranes are moist.   Neck: No stridor. Cardiovascular/Chest: Normal rate, regular rhythm.  No murmurs, rubs, or gallops. Respiratory: Normal respiratory effort without tachypnea nor retractions. Breath sounds are clear and equal bilaterally. No wheezes/rales/rhonchi. Gastrointestinal: Soft. No distention, no guarding, no rebound. Nontender.    Genitourinary/rectal:Deferred Musculoskeletal: Nontender with normal range of motion in all extremities. No joint effusions.  No lower extremity tenderness.  No edema. Neurologic:  Normal speech and language. No gross or focal neurologic deficits are appreciated. Skin:  Skin is warm, dry and intact. No rash noted. Psychiatric: Mood  and affect are normal. Speech and behavior are normal. Patient exhibits appropriate insight and judgment.   ____________________________________________  LABS (pertinent positives/negatives)  Labs Reviewed  BASIC METABOLIC PANEL - Abnormal; Notable for the following:       Result Value   Creatinine, Ser 1.09 (*)    GFR calc non Af Amer 48 (*)    GFR calc Af Amer 56 (*)    All other components within normal limits  CBC WITH DIFFERENTIAL/PLATELET  TROPONIN I  TROPONIN I    ____________________________________________    EKG I, Lisa Roca, MD, the attending physician have personally viewed and interpreted all ECGs.  75 bpm. Normal sinus rhythm. Narrow QRS. Normal axis. T waves inverted in 3 and aVF as well as laterally. These findings are similar to prior EKG. ____________________________________________  RADIOLOGY All Xrays were viewed by me. Imaging interpreted by Radiologist.  Chest x-ray two-view:  IMPRESSION: No active disease. Large hiatal hernia. Osteopenia and mild degenerative changes lower thoracic spine. __________________________________________  PROCEDURES  Procedure(s) performed: None  Critical Care  performed: None  ____________________________________________   ED COURSE / ASSESSMENT AND PLAN  Pertinent labs & imaging results that were available during my care of the patient were reviewed by me and considered in my medical decision making (see chart for details).   Ms. Cantalupo is here for elevated blood pressures, and her blood pressure is improved after taking her recently diagnosed second blood pressure pill this morning.  Her EKG is unchanged from prior. However when I ask her, she states that she has been having chest pressure episodes including just recently here in the emergency department. Given the hypertension and risk factors, I did discuss with her cardiac evaluation with laboratory studies and troponin with a delta troponin if first is negative.   There was some delay drawing the blood work, however initial set was reassuring with negative troponin. Repeat troponin 2 hours later is still negative, and greater than 4 hours from onset of pain.  Will discharge home for follow-up with cardiology and primary care.    CONSULTATIONS:  None  Patient / Family / Caregiver informed of clinical course, medical decision-making process, and agree with plan.   I discussed return precautions, follow-up instructions, and discharge instructions with patient and/or family.   ___________________________________________   FINAL CLINICAL IMPRESSION(S) / ED DIAGNOSES   Final diagnoses:  Hypertension, unspecified type  Chest pressure              Note: This dictation was prepared with Dragon dictation. Any transcriptional errors that result from this process are unintentional    Lisa Roca, MD 04/11/16 1711

## 2016-04-11 NOTE — ED Triage Notes (Signed)
Pt presents to ED with c/o hypertension. Pt states BP has been over [id number] systolic at home x 2 days. Pt states she takes diltiazem and losartan and has not been compliant with her medications. Pt states she has been taking her BP medication for 2 days. Pt is alert and oriented at this time. NAD noted, neurologically patient is intact at this time. Pt's BP 171/79 in triage.

## 2016-04-15 ENCOUNTER — Telehealth: Payer: Self-pay

## 2016-04-15 NOTE — Telephone Encounter (Signed)
Attempted to call patient no answer no vm  Needed to make ED FU seen for Hypertension on 04/11/16  Will try again later

## 2016-04-16 DIAGNOSIS — E782 Mixed hyperlipidemia: Secondary | ICD-10-CM | POA: Diagnosis not present

## 2016-04-16 DIAGNOSIS — I471 Supraventricular tachycardia: Secondary | ICD-10-CM | POA: Diagnosis not present

## 2016-04-16 DIAGNOSIS — K210 Gastro-esophageal reflux disease with esophagitis: Secondary | ICD-10-CM | POA: Diagnosis not present

## 2016-04-16 DIAGNOSIS — I2510 Atherosclerotic heart disease of native coronary artery without angina pectoris: Secondary | ICD-10-CM | POA: Diagnosis not present

## 2016-04-16 DIAGNOSIS — I10 Essential (primary) hypertension: Secondary | ICD-10-CM | POA: Diagnosis not present

## 2016-04-23 NOTE — Telephone Encounter (Signed)
Attempted to call patient no answer no vm  Needed to make ED FU seen for Hypertension on 04/11/16  Will try again later

## 2016-04-30 NOTE — Telephone Encounter (Signed)
Sent letter to patient.

## 2016-05-17 DIAGNOSIS — I10 Essential (primary) hypertension: Secondary | ICD-10-CM | POA: Diagnosis not present

## 2016-05-17 DIAGNOSIS — G4700 Insomnia, unspecified: Secondary | ICD-10-CM | POA: Diagnosis not present

## 2016-05-17 DIAGNOSIS — M5441 Lumbago with sciatica, right side: Secondary | ICD-10-CM | POA: Diagnosis not present

## 2016-05-17 DIAGNOSIS — E782 Mixed hyperlipidemia: Secondary | ICD-10-CM | POA: Diagnosis not present

## 2016-05-24 DIAGNOSIS — M25572 Pain in left ankle and joints of left foot: Secondary | ICD-10-CM | POA: Diagnosis not present

## 2016-06-26 ENCOUNTER — Ambulatory Visit: Payer: Medicare Other | Admitting: Obstetrics and Gynecology

## 2016-07-02 DIAGNOSIS — I2510 Atherosclerotic heart disease of native coronary artery without angina pectoris: Secondary | ICD-10-CM | POA: Diagnosis not present

## 2016-07-02 DIAGNOSIS — I10 Essential (primary) hypertension: Secondary | ICD-10-CM | POA: Diagnosis not present

## 2016-07-02 DIAGNOSIS — E782 Mixed hyperlipidemia: Secondary | ICD-10-CM | POA: Diagnosis not present

## 2016-07-03 ENCOUNTER — Encounter: Payer: Self-pay | Admitting: Obstetrics and Gynecology

## 2016-07-03 ENCOUNTER — Ambulatory Visit (INDEPENDENT_AMBULATORY_CARE_PROVIDER_SITE_OTHER): Payer: Medicare Other | Admitting: Obstetrics and Gynecology

## 2016-07-03 VITALS — BP 148/76 | HR 89 | Ht 65.0 in | Wt 185.9 lb

## 2016-07-03 DIAGNOSIS — Z90710 Acquired absence of both cervix and uterus: Secondary | ICD-10-CM

## 2016-07-03 DIAGNOSIS — Z8041 Family history of malignant neoplasm of ovary: Secondary | ICD-10-CM

## 2016-07-03 DIAGNOSIS — Z803 Family history of malignant neoplasm of breast: Secondary | ICD-10-CM | POA: Diagnosis not present

## 2016-07-03 NOTE — Patient Instructions (Signed)
1. Recommend follow-up in 1 year regarding family history of ovarian cancer  Summary: Genetic testing for ovarian cancer and breast cancer is negative Pelvic ultrasound from August 2017 is normal No current identifiable symptoms for possible ovarian cancer is present

## 2016-07-03 NOTE — Progress Notes (Signed)
Chief complaint: 1. Family history of ovarian cancer 2. Family history of breast cancer  Patient presents for interval follow-up. Genetic testing for ovarian cancer and breast cancer is negative. Patient has baseline risk of ovarian cancer at 3.1%. Patient had pelvic ultrasound in August 2017 which was negative for abnormality. Patient remains asymptomatic at this time with regards to possible constitutional symptoms. She denies fever, chills, night sweats, loss of appetite, weight gain, weight loss, pelvic pain, change in bowel habits, etc.  Past medical history, past surgical history, problem list, medications, and allergies are reviewed  OBJECTIVE: BP (!) 148/76   Pulse 89   Ht 5\' 5"  (1.651 m)   Wt 185 lb 14.4 oz (84.3 kg)   BMI 30.94 kg/m  Pleasant female in no acute distress. Alert and oriented. Abdomen: Soft, nontender, without organomegaly. No hernias.  Pelvic exam: External genitalia-normal BUS-normal Bimanual-no palpable masses or tenderness; vaginal cuff is intact External exam is normal  ASSESSMENT: 1. Family history of breast cancer and ovarian cancer 2. Clinically asymptomatic 3. Genetic testing negative for increased risk for either breast or ovarian cancer 4. Pelvic ultrasound August 2017 negative for abnormality  PLAN: 1. Reassurance is given regarding patient's risk for ovarian cancer 2. Patient is to return annually for follow-up 3. Patient may return as needed if she develops constitutional symptoms suspicious for possible ovarian cancer.  A total of 15 minutes were spent face-to-face with the patient during this encounter and over half of that time dealt with counseling and coordination of care.  Brayton Mars, MD  Note: This dictation was prepared with Dragon dictation along with smaller phrase technology. Any transcriptional errors that result from this process are unintentional.

## 2016-08-23 ENCOUNTER — Encounter: Payer: Self-pay | Admitting: Emergency Medicine

## 2016-08-23 ENCOUNTER — Emergency Department
Admission: EM | Admit: 2016-08-23 | Discharge: 2016-08-23 | Disposition: A | Discharge status: Home / Self Care | Payer: Medicare Other | Attending: Emergency Medicine | Admitting: Emergency Medicine

## 2016-08-23 ENCOUNTER — Emergency Department: Payer: Medicare Other

## 2016-08-23 DIAGNOSIS — R918 Other nonspecific abnormal finding of lung field: Secondary | ICD-10-CM | POA: Diagnosis not present

## 2016-08-23 DIAGNOSIS — R002 Palpitations: Secondary | ICD-10-CM | POA: Diagnosis not present

## 2016-08-23 DIAGNOSIS — I10 Essential (primary) hypertension: Secondary | ICD-10-CM | POA: Insufficient documentation

## 2016-08-23 DIAGNOSIS — I2510 Atherosclerotic heart disease of native coronary artery without angina pectoris: Secondary | ICD-10-CM | POA: Insufficient documentation

## 2016-08-23 DIAGNOSIS — I498 Other specified cardiac arrhythmias: Secondary | ICD-10-CM | POA: Diagnosis not present

## 2016-08-23 DIAGNOSIS — Z79899 Other long term (current) drug therapy: Secondary | ICD-10-CM | POA: Diagnosis not present

## 2016-08-23 DIAGNOSIS — Z87891 Personal history of nicotine dependence: Secondary | ICD-10-CM | POA: Insufficient documentation

## 2016-08-23 LAB — CBC
HEMATOCRIT: 41.2 % (ref 35.0–47.0)
HEMOGLOBIN: 14.2 g/dL (ref 12.0–16.0)
MCH: 29.9 pg (ref 26.0–34.0)
MCHC: 34.6 g/dL (ref 32.0–36.0)
MCV: 86.5 fL (ref 80.0–100.0)
Platelets: 251 10*3/uL (ref 150–440)
RBC: 4.77 MIL/uL (ref 3.80–5.20)
RDW: 14.2 % (ref 11.5–14.5)
WBC: 9.4 10*3/uL (ref 3.6–11.0)

## 2016-08-23 LAB — BASIC METABOLIC PANEL
ANION GAP: 8 (ref 5–15)
BUN: 14 mg/dL (ref 6–20)
CO2: 28 mmol/L (ref 22–32)
Calcium: 9.4 mg/dL (ref 8.9–10.3)
Chloride: 105 mmol/L (ref 101–111)
Creatinine, Ser: 1.15 mg/dL — ABNORMAL HIGH (ref 0.44–1.00)
GFR calc Af Amer: 53 mL/min — ABNORMAL LOW (ref >60)
GFR, EST NON AFRICAN AMERICAN: 45 mL/min — AB (ref >60)
GLUCOSE: 104 mg/dL — AB (ref 65–99)
POTASSIUM: 3.5 mmol/L (ref 3.5–5.1)
Sodium: 141 mmol/L (ref 135–145)

## 2016-08-23 LAB — TROPONIN I: Troponin I: <0.03 ng/mL (ref <0.03)

## 2016-08-23 NOTE — ED Triage Notes (Addendum)
Pt to triage via w/c with no distress noted; pt reports x 3 days having intermittent sensation of "heart fluttering" accomp by weakness; st hx of same and dx with "irregular heart beat" and rx losartan and diltiazem; also reports some "chest discomfort"

## 2016-08-23 NOTE — ED Provider Notes (Signed)
[facility] Emergency Department Provider Note   ____________________________________________   First MD Initiated Contact with Patient 08/23/16 0217     (approximate)  I have reviewed the triage vital signs and the nursing notes.   HISTORY  Chief Complaint Palpitations   HPI Amanda Duke is a 76 y.o. female who reports palpitations starting this afternoon. They were going on off and on all afternoon bedtime.. Patient sees Dr. Gigi Gin  normally. She did not have any chest pain.  Past Medical History:  Diagnosis Date  . GERD (gastroesophageal reflux disease)   . Hyperlipemia   . Hypertension   . Irregular heart beat   . Prediabetes   . Renal cyst    bilateral    Patient Active Problem List   Diagnosis Date Noted  . Family history of breast cancer in first degree relative 02/02/2015  . Family history of ovarian cancer 02/02/2015  . Constipation 02/02/2015  . Anxiety 02/02/2015  . Osteoporosis 02/02/2015  . Benign essential HTN 12/16/2014  . Combined fat and carbohydrate induced hyperlipemia 05/19/2014  . Acid reflux 05/19/2014  . Arteriosclerosis of coronary artery 05/19/2014  . Acute chest pain 05/19/2014  . 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 Urge incontinence 11/20/2012  . Renal colic 85/05/7739  . Symptoms involving urinary system 11/20/2012  . Incomplete bladder emptying 11/20/2012  . Frank hematuria 11/20/2012  . Bladder infection, chronic 11/20/2012    Past Surgical History:  Procedure Laterality Date  . APPENDECTOMY  1971  . ARTHRODESIS  1980  . BACK SURGERY  1980  . Jud   right  . TONSILLECTOMY  1948  . TUBAL LIGATION  1971  . VAGINAL HYSTERECTOMY  1985   d/t heavy bleeding    Prior to Admission medications   Medication Sig Start Date End Date Taking? Authorizing Provider    cetirizine (ZYRTEC) 10 MG tablet Take 10 mg by mouth daily.    Historical Provider, MD  cholecalciferol (VITAMIN D) 1000 units tablet Take 1,000 Units by mouth daily.    Historical Provider, MD  clonazePAM (KLONOPIN) 0.5 MG tablet Take 0.25 mg by mouth 2 (two) times daily as needed for anxiety.    Historical Provider, MD  diltiazem (CARDIZEM CD) 120 MG 24 hr capsule Take 120 mg by mouth daily.  05/23/15 07/03/16  Historical Provider, MD  fluticasone (FLONASE) 50 MCG/ACT nasal spray Place into the nose. 03/15/16   Historical Provider, MD  hydrOXYzine (ATARAX/VISTARIL) 25 MG tablet Take by mouth.    Historical Provider, MD  losartan (COZAAR) 25 MG tablet Take 25 mg by mouth daily.    Historical Provider, MD  omega-3 acid ethyl esters (LOVAZA) 1 g capsule Take by mouth.    Historical Provider, MD  omeprazole (PRILOSEC) 40 MG capsule Take 40 mg by mouth every other day.  01/05/15   Historical Provider, MD  oxybutynin (DITROPAN) 5 MG tablet Take 5 mg by mouth. 04/01/16   Historical Provider, MD  rosuvastatin (CRESTOR) 20 MG tablet Take 20 mg by mouth daily. 12/31/14   Historical Provider, MD    Allergies Amoxicillin; Clindamycin; Gemfibrozil; Levofloxacin; Levofloxacin in d5w; Metoprolol; Simvastatin; Sulfa antibiotics; and Nystatin-triamcinolone  Family History  Problem Relation Age of Onset  . Heart failure Mother   . Stroke Mother   . Breast cancer Mother     dx at age 73 yo  . Diabetes Mother   .  Osteoporosis Mother   . Emphysema Father   . Ovarian cancer Sister     dx at age 63 yo  . Asthma Sister     Social History Social History  Substance Use Topics  . Smoking status: Former Research scientist (life sciences)  . Smokeless tobacco: Never Used  . Alcohol use No    Review of Systems  Constitutional: No fever/chills Eyes: No visual changes. ENT: No sore throat. Cardiovascular: Denies chest pain. Respiratory: Denies shortness of breath. Gastrointestinal: No abdominal pain.  No nausea, no vomiting.  No  diarrhea.  No constipation. Genitourinary: Negative for dysuria. Musculoskeletal: Negative for back pain. Skin: Negative for rash. Neurological: Negative for headaches, focal weakness or numbness.   ____________________________________________   PHYSICAL EXAM:  VITAL SIGNS: ED Triage Vitals  Enc Vitals Group     BP 08/23/16 0017 (!) 145/80     Pulse Rate 08/23/16 0017 80     Resp 08/23/16 0017 18     Temp 08/23/16 0017 98.2 F (36.8 C)     Temp Source 08/23/16 0017 Oral     SpO2 08/23/16 0017 97 %     Weight 08/23/16 0011 180 lb (81.6 kg)     Height 08/23/16 0011 5\' 6"  (1.676 m)     Head Circumference --      Peak Flow --      Pain Score 08/23/16 0011 4     Pain Loc --      Pain Edu? --      Excl. in [place]? --     Constitutional: Alert and oriented. Well appearing and in no acute distress. Eyes: Conjunctivae are normal. PERRL. EOMI. Head: Atraumatic. Nose: No congestion/rhinnorhea. Mouth/Throat: Mucous membranes are moist.  Oropharynx non-erythematous. Neck: No stridor. Cardiovascular: Normal rate, regular rhythm. Grossly normal heart sounds.  Good peripheral circulation. Respiratory: Normal respiratory effort.  No retractions. Lungs CTAB. Gastrointestinal: Soft and nontender. No distention. No abdominal bruits. No CVA tenderness. Musculoskeletal: No lower extremity tenderness nor edema.  No joint effusions. Neurologic:  Normal speech and language. No gross focal neurologic deficits are appreciated.  Skin:  Skin is warm, dry and intact. No rash noted.   ____________________________________________   LABS (all labs ordered are listed, but only abnormal results are displayed)  Labs Reviewed  BASIC METABOLIC PANEL - Abnormal; Notable for the following:       Result Value   Glucose, Bld 104 (*)    Creatinine, Ser 1.15 (*)    GFR calc non Af Amer 45 (*)    GFR calc Af Amer 53 (*)    All other components within normal limits  CBC  TROPONIN I  TROPONIN I    ____________________________________________  EKG  EKG read and interpreted by me shows normal sinus rhythm rate of 75 normal axis nonspecific ST-T wave changes EKG looks similar to one from 04/11/2016 ____________________________________________  RADIOLOGY Study Result   CLINICAL DATA:  Three days of heart fluttering, former smoker.  EXAM: CHEST  2 VIEW  COMPARISON:  Chest radiographs dating back through 03/18/2005  FINDINGS: A large hiatal hernia with air-fluid level is unchanged in appearance. The cardiac silhouette is normal in size. There is no aortic aneurysm. Tiny right upper lobe spiculation is suggested on the frontal view measuring approximately 6 mm. This appears more dense than on prior studies and cannot exclude the possibility of a small pulmonary lesion as result. No pneumonic consolidation, effusion or pneumothorax.  IMPRESSION: Tiny spiculated 6 mm density in the right upper lobe. A  pulmonary lesion is not excluded and can be further correlated with a chest CT.  Stable large hiatal hernia.   Electronically Signed   By: Ashley Royalty M.D.   On: 08/23/2016 00:58    Study Result   CLINICAL DATA:  Heart fluttering  EXAM: CT CHEST WITHOUT CONTRAST  TECHNIQUE: Multidetector CT imaging of the chest was performed following the standard protocol without IV contrast.  COMPARISON:  None.  FINDINGS: Cardiovascular: Normal size cardiac chambers without pericardial effusion. Mild ectasia of the ascending aorta to 3.7 cm along the ascending portion. There is coronary arteriosclerosis along the left main and right coronary arteries. Large hiatal hernia is present.  Mediastinum/Nodes: No enlarged mediastinal or axillary lymph nodes. Thyroid gland, trachea, and esophagus demonstrate no significant findings.  Lungs/Pleura: Lungs demonstrate bibasilar atelectasis and/or scarring at the lung bases. No pneumonic consolidation or CHF.  No pleural effusion or pneumothorax.  Upper Abdomen: No acute abnormality.  Musculoskeletal: No chest wall mass or suspicious bone lesions identified.  IMPRESSION: Large hiatal hernia.  Coronary arteriosclerosis. Minimal aortic atherosclerosis with ectasia of the ascending colon.  No acute pulmonary disease.   Electronically Signed   By: Ashley Royalty M.D.   On: 08/23/2016 03:25      ____________________________________________   PROCEDURES  Procedure(s) performed:   Procedures  Critical Care performed:   ____________________________________________   INITIAL IMPRESSION / ASSESSMENT AND PLAN / ED COURSE  Pertinent labs & imaging results that were available during my care of the patient were reviewed by me and considered in my medical decision making (see chart for details).    Clinical Course as of Aug 24 347  Fri Aug 23, 2016  0217 Calcium: 9.4 [PM]    Clinical Course User Index [PM] Nena Polio, MD     ____________________________________________   FINAL CLINICAL IMPRESSION(S) / ED DIAGNOSES  Final diagnoses:  Heart palpitations      NEW MEDICATIONS STARTED DURING THIS VISIT:  New Prescriptions   No medications on file     Note:  This document was prepared using Dragon voice recognition software and may include unintentional dictation errors.    Nena Polio, MD 08/23/16 0400

## 2016-08-23 NOTE — Discharge Instructions (Signed)
Please continue your medications. Please return if the palpitations come back again and do not resolve. Please follow-up with your cardiologist on Monday. Please make sure you're avoiding caffeine.

## 2016-08-23 NOTE — ED Notes (Signed)
Patient transported to CT 

## 2016-09-09 DIAGNOSIS — R10817 Generalized abdominal tenderness: Secondary | ICD-10-CM | POA: Diagnosis not present

## 2016-09-09 DIAGNOSIS — N390 Urinary tract infection, site not specified: Secondary | ICD-10-CM | POA: Diagnosis not present

## 2016-09-09 DIAGNOSIS — I10 Essential (primary) hypertension: Secondary | ICD-10-CM | POA: Diagnosis not present

## 2016-09-19 ENCOUNTER — Other Ambulatory Visit: Payer: Self-pay | Admitting: Internal Medicine

## 2016-09-19 DIAGNOSIS — R10817 Generalized abdominal tenderness: Secondary | ICD-10-CM | POA: Diagnosis not present

## 2016-09-19 DIAGNOSIS — N390 Urinary tract infection, site not specified: Secondary | ICD-10-CM | POA: Diagnosis not present

## 2016-09-19 DIAGNOSIS — R911 Solitary pulmonary nodule: Secondary | ICD-10-CM | POA: Diagnosis not present

## 2016-09-19 DIAGNOSIS — K5900 Constipation, unspecified: Secondary | ICD-10-CM | POA: Diagnosis not present

## 2016-09-27 ENCOUNTER — Ambulatory Visit
Admission: RE | Admit: 2016-09-27 | Discharge: 2016-09-27 | Disposition: A | Discharge status: Home / Self Care | Payer: Medicare Other | Source: Ambulatory Visit | Attending: Internal Medicine | Admitting: Internal Medicine

## 2016-09-27 DIAGNOSIS — K6389 Other specified diseases of intestine: Secondary | ICD-10-CM | POA: Insufficient documentation

## 2016-09-27 DIAGNOSIS — N8110 Cystocele, unspecified: Secondary | ICD-10-CM | POA: Diagnosis not present

## 2016-09-27 DIAGNOSIS — R10817 Generalized abdominal tenderness: Secondary | ICD-10-CM | POA: Diagnosis present

## 2016-09-27 DIAGNOSIS — K449 Diaphragmatic hernia without obstruction or gangrene: Secondary | ICD-10-CM | POA: Insufficient documentation

## 2016-09-27 DIAGNOSIS — R109 Unspecified abdominal pain: Secondary | ICD-10-CM | POA: Diagnosis not present

## 2016-09-27 DIAGNOSIS — Z90710 Acquired absence of both cervix and uterus: Secondary | ICD-10-CM | POA: Diagnosis not present

## 2016-09-27 DIAGNOSIS — I700 Atherosclerosis of aorta: Secondary | ICD-10-CM | POA: Insufficient documentation

## 2016-09-27 DIAGNOSIS — M5136 Other intervertebral disc degeneration, lumbar region: Secondary | ICD-10-CM | POA: Diagnosis not present

## 2016-09-27 MED ORDER — IOPAMIDOL (ISOVUE-300) INJECTION 61%
85.0000 mL | Freq: Once | INTRAVENOUS | Status: AC | PRN
  Administered 2016-09-27: 85 mL via INTRAVENOUS

## 2016-10-24 DIAGNOSIS — K529 Noninfective gastroenteritis and colitis, unspecified: Secondary | ICD-10-CM | POA: Diagnosis not present

## 2016-10-24 DIAGNOSIS — I700 Atherosclerosis of aorta: Secondary | ICD-10-CM | POA: Diagnosis not present

## 2016-10-24 DIAGNOSIS — M150 Primary generalized (osteo)arthritis: Secondary | ICD-10-CM | POA: Diagnosis not present

## 2016-10-24 DIAGNOSIS — R911 Solitary pulmonary nodule: Secondary | ICD-10-CM | POA: Diagnosis not present

## 2016-10-24 DIAGNOSIS — I10 Essential (primary) hypertension: Secondary | ICD-10-CM | POA: Diagnosis not present

## 2016-11-04 ENCOUNTER — Other Ambulatory Visit
Admission: RE | Admit: 2016-11-04 | Discharge: 2016-11-04 | Disposition: A | Discharge status: Home / Self Care | Payer: Medicare Other | Source: Ambulatory Visit | Attending: Internal Medicine | Admitting: Internal Medicine

## 2016-11-04 DIAGNOSIS — R911 Solitary pulmonary nodule: Secondary | ICD-10-CM | POA: Diagnosis not present

## 2016-11-04 DIAGNOSIS — E782 Mixed hyperlipidemia: Secondary | ICD-10-CM | POA: Diagnosis not present

## 2016-11-04 DIAGNOSIS — K5900 Constipation, unspecified: Secondary | ICD-10-CM | POA: Diagnosis not present

## 2016-11-04 DIAGNOSIS — I10 Essential (primary) hypertension: Secondary | ICD-10-CM | POA: Insufficient documentation

## 2016-11-04 DIAGNOSIS — I2510 Atherosclerotic heart disease of native coronary artery without angina pectoris: Secondary | ICD-10-CM | POA: Insufficient documentation

## 2016-11-04 LAB — COMPREHENSIVE METABOLIC PANEL
ALK PHOS: 76 U/L (ref 38–126)
ALT: 15 U/L (ref 14–54)
ANION GAP: 8 (ref 5–15)
AST: 24 U/L (ref 15–41)
Albumin: 4.1 g/dL (ref 3.5–5.0)
BILIRUBIN TOTAL: 0.9 mg/dL (ref 0.3–1.2)
BUN: 13 mg/dL (ref 6–20)
CALCIUM: 9.4 mg/dL (ref 8.9–10.3)
CO2: 27 mmol/L (ref 22–32)
CREATININE: 1.08 mg/dL — AB (ref 0.44–1.00)
Chloride: 105 mmol/L (ref 101–111)
GFR, EST AFRICAN AMERICAN: 57 mL/min — AB (ref >60)
GFR, EST NON AFRICAN AMERICAN: 49 mL/min — AB (ref >60)
Glucose, Bld: 109 mg/dL — ABNORMAL HIGH (ref 65–99)
Potassium: 4.2 mmol/L (ref 3.5–5.1)
SODIUM: 140 mmol/L (ref 135–145)
TOTAL PROTEIN: 7.4 g/dL (ref 6.5–8.1)

## 2016-11-04 LAB — CBC WITH DIFFERENTIAL/PLATELET
BASOS ABS: 0.1 10*3/uL (ref 0–0.1)
Basophils Relative: 2 %
EOS PCT: 2 %
Eosinophils Absolute: 0.1 10*3/uL (ref 0–0.7)
HEMATOCRIT: 41.6 % (ref 35.0–47.0)
Hemoglobin: 14.2 g/dL (ref 12.0–16.0)
LYMPHS ABS: 2.1 10*3/uL (ref 1.0–3.6)
LYMPHS PCT: 35 %
MCH: 29.6 pg (ref 26.0–34.0)
MCHC: 34.1 g/dL (ref 32.0–36.0)
MCV: 86.9 fL (ref 80.0–100.0)
MONO ABS: 0.6 10*3/uL (ref 0.2–0.9)
Monocytes Relative: 10 %
NEUTROS ABS: 3.1 10*3/uL (ref 1.4–6.5)
Neutrophils Relative %: 51 %
PLATELETS: 229 10*3/uL (ref 150–440)
RBC: 4.78 MIL/uL (ref 3.80–5.20)
RDW: 14.3 % (ref 11.5–14.5)
WBC: 6 10*3/uL (ref 3.6–11.0)

## 2016-11-04 LAB — T4, FREE: Free T4: 0.75 ng/dL (ref 0.61–1.12)

## 2016-11-04 LAB — LIPID PANEL
CHOLESTEROL: 147 mg/dL (ref 0–200)
HDL: 43 mg/dL (ref >40)
LDL CALC: 75 mg/dL (ref 0–99)
TRIGLYCERIDES: 143 mg/dL (ref <150)
Total CHOL/HDL Ratio: 3.4 RATIO
VLDL: 29 mg/dL (ref 0–40)

## 2016-11-04 LAB — TSH: TSH: 1.515 u[IU]/mL (ref 0.350–4.500)

## 2016-11-06 DIAGNOSIS — I10 Essential (primary) hypertension: Secondary | ICD-10-CM | POA: Diagnosis not present

## 2016-11-06 DIAGNOSIS — I471 Supraventricular tachycardia: Secondary | ICD-10-CM | POA: Diagnosis not present

## 2016-11-06 DIAGNOSIS — I2510 Atherosclerotic heart disease of native coronary artery without angina pectoris: Secondary | ICD-10-CM | POA: Diagnosis not present

## 2016-11-06 DIAGNOSIS — K210 Gastro-esophageal reflux disease with esophagitis: Secondary | ICD-10-CM | POA: Diagnosis not present

## 2016-11-06 DIAGNOSIS — E782 Mixed hyperlipidemia: Secondary | ICD-10-CM | POA: Diagnosis not present

## 2016-11-14 DIAGNOSIS — R933 Abnormal findings on diagnostic imaging of other parts of digestive tract: Secondary | ICD-10-CM | POA: Diagnosis not present

## 2016-11-14 DIAGNOSIS — R1013 Epigastric pain: Secondary | ICD-10-CM | POA: Diagnosis not present

## 2016-11-14 DIAGNOSIS — Z1211 Encounter for screening for malignant neoplasm of colon: Secondary | ICD-10-CM | POA: Diagnosis not present

## 2016-12-05 DIAGNOSIS — G4700 Insomnia, unspecified: Secondary | ICD-10-CM | POA: Diagnosis not present

## 2016-12-05 DIAGNOSIS — Z0000 Encounter for general adult medical examination without abnormal findings: Secondary | ICD-10-CM | POA: Diagnosis not present

## 2016-12-05 DIAGNOSIS — N390 Urinary tract infection, site not specified: Secondary | ICD-10-CM | POA: Diagnosis not present

## 2016-12-05 DIAGNOSIS — K219 Gastro-esophageal reflux disease without esophagitis: Secondary | ICD-10-CM | POA: Diagnosis not present

## 2016-12-05 DIAGNOSIS — Z0001 Encounter for general adult medical examination with abnormal findings: Secondary | ICD-10-CM | POA: Diagnosis not present

## 2016-12-05 DIAGNOSIS — I10 Essential (primary) hypertension: Secondary | ICD-10-CM | POA: Diagnosis not present

## 2016-12-05 DIAGNOSIS — K5900 Constipation, unspecified: Secondary | ICD-10-CM | POA: Diagnosis not present

## 2016-12-05 DIAGNOSIS — E782 Mixed hyperlipidemia: Secondary | ICD-10-CM | POA: Diagnosis not present

## 2016-12-12 ENCOUNTER — Ambulatory Visit: Payer: Medicare Other | Admitting: Anesthesiology

## 2016-12-12 ENCOUNTER — Encounter
Admission: RE | Disposition: A | Discharge status: Home / Self Care | Payer: Self-pay | Source: Ambulatory Visit | Attending: Gastroenterology

## 2016-12-12 ENCOUNTER — Ambulatory Visit
Admission: RE | Admit: 2016-12-12 | Discharge: 2016-12-12 | Disposition: A | Discharge status: Home / Self Care | Payer: Medicare Other | Source: Ambulatory Visit | Attending: Gastroenterology | Admitting: Gastroenterology

## 2016-12-12 ENCOUNTER — Encounter: Payer: Self-pay | Admitting: *Deleted

## 2016-12-12 DIAGNOSIS — I10 Essential (primary) hypertension: Secondary | ICD-10-CM | POA: Insufficient documentation

## 2016-12-12 DIAGNOSIS — Z882 Allergy status to sulfonamides status: Secondary | ICD-10-CM | POA: Diagnosis not present

## 2016-12-12 DIAGNOSIS — K635 Polyp of colon: Secondary | ICD-10-CM | POA: Diagnosis not present

## 2016-12-12 DIAGNOSIS — K2970 Gastritis, unspecified, without bleeding: Secondary | ICD-10-CM | POA: Insufficient documentation

## 2016-12-12 DIAGNOSIS — K30 Functional dyspepsia: Secondary | ICD-10-CM | POA: Diagnosis not present

## 2016-12-12 DIAGNOSIS — F419 Anxiety disorder, unspecified: Secondary | ICD-10-CM | POA: Diagnosis not present

## 2016-12-12 DIAGNOSIS — K219 Gastro-esophageal reflux disease without esophagitis: Secondary | ICD-10-CM | POA: Diagnosis not present

## 2016-12-12 DIAGNOSIS — D123 Benign neoplasm of transverse colon: Secondary | ICD-10-CM | POA: Diagnosis not present

## 2016-12-12 DIAGNOSIS — Q438 Other specified congenital malformations of intestine: Secondary | ICD-10-CM | POA: Insufficient documentation

## 2016-12-12 DIAGNOSIS — R7303 Prediabetes: Secondary | ICD-10-CM | POA: Insufficient documentation

## 2016-12-12 DIAGNOSIS — Z1211 Encounter for screening for malignant neoplasm of colon: Secondary | ICD-10-CM | POA: Diagnosis not present

## 2016-12-12 DIAGNOSIS — K449 Diaphragmatic hernia without obstruction or gangrene: Secondary | ICD-10-CM | POA: Insufficient documentation

## 2016-12-12 DIAGNOSIS — Z880 Allergy status to penicillin: Secondary | ICD-10-CM | POA: Diagnosis not present

## 2016-12-12 DIAGNOSIS — K224 Dyskinesia of esophagus: Secondary | ICD-10-CM | POA: Diagnosis not present

## 2016-12-12 DIAGNOSIS — E785 Hyperlipidemia, unspecified: Secondary | ICD-10-CM | POA: Diagnosis not present

## 2016-12-12 DIAGNOSIS — K2960 Other gastritis without bleeding: Secondary | ICD-10-CM | POA: Diagnosis not present

## 2016-12-12 DIAGNOSIS — I2510 Atherosclerotic heart disease of native coronary artery without angina pectoris: Secondary | ICD-10-CM | POA: Diagnosis not present

## 2016-12-12 DIAGNOSIS — Q399 Congenital malformation of esophagus, unspecified: Secondary | ICD-10-CM | POA: Diagnosis not present

## 2016-12-12 DIAGNOSIS — D124 Benign neoplasm of descending colon: Secondary | ICD-10-CM | POA: Diagnosis not present

## 2016-12-12 DIAGNOSIS — Z87891 Personal history of nicotine dependence: Secondary | ICD-10-CM | POA: Diagnosis not present

## 2016-12-12 DIAGNOSIS — Z79899 Other long term (current) drug therapy: Secondary | ICD-10-CM | POA: Diagnosis not present

## 2016-12-12 DIAGNOSIS — K5730 Diverticulosis of large intestine without perforation or abscess without bleeding: Secondary | ICD-10-CM | POA: Diagnosis not present

## 2016-12-12 DIAGNOSIS — K2950 Unspecified chronic gastritis without bleeding: Secondary | ICD-10-CM | POA: Diagnosis not present

## 2016-12-12 HISTORY — DX: Prediabetes: R73.03

## 2016-12-12 HISTORY — PX: ESOPHAGOGASTRODUODENOSCOPY (EGD) WITH PROPOFOL: SHX5813

## 2016-12-12 HISTORY — DX: Anxiety disorder, unspecified: F41.9

## 2016-12-12 HISTORY — PX: COLONOSCOPY WITH PROPOFOL: SHX5780

## 2016-12-12 HISTORY — DX: Atherosclerotic heart disease of native coronary artery without angina pectoris: I25.10

## 2016-12-12 SURGERY — ESOPHAGOGASTRODUODENOSCOPY (EGD) WITH PROPOFOL
Anesthesia: General

## 2016-12-12 MED ORDER — PROPOFOL 500 MG/50ML IV EMUL
INTRAVENOUS | Status: AC
  Filled 2016-12-12: qty 50

## 2016-12-12 MED ORDER — PROPOFOL 10 MG/ML IV BOLUS
INTRAVENOUS | Status: DC | PRN
  Administered 2016-12-12: 100 mg via INTRAVENOUS

## 2016-12-12 MED ORDER — SPOT INK MARKER SYRINGE KIT
PACK | SUBMUCOSAL | Status: DC | PRN
  Administered 2016-12-12: 2 mL via SUBMUCOSAL

## 2016-12-12 MED ORDER — SODIUM CHLORIDE 0.9 % IV SOLN: INTRAVENOUS | Status: DC

## 2016-12-12 MED ORDER — FENTANYL CITRATE (PF) 100 MCG/2ML IJ SOLN
INTRAMUSCULAR | Status: AC
  Filled 2016-12-12: qty 2

## 2016-12-12 MED ORDER — EPHEDRINE SULFATE 50 MG/ML IJ SOLN
INTRAMUSCULAR | Status: DC | PRN
  Administered 2016-12-12: 5 mg via INTRAVENOUS

## 2016-12-12 MED ORDER — SODIUM CHLORIDE 0.9 % IJ SOLN
INTRAMUSCULAR | Status: AC
  Filled 2016-12-12: qty 10

## 2016-12-12 MED ORDER — PROPOFOL 10 MG/ML IV BOLUS
INTRAVENOUS | Status: AC
  Filled 2016-12-12: qty 20

## 2016-12-12 MED ORDER — LIDOCAINE 2% (20 MG/ML) 5 ML SYRINGE
INTRAMUSCULAR | Status: DC | PRN
  Administered 2016-12-12: 40 mg via INTRAVENOUS

## 2016-12-12 MED ORDER — MIDAZOLAM HCL 2 MG/2ML IJ SOLN
INTRAMUSCULAR | Status: AC
  Filled 2016-12-12: qty 2

## 2016-12-12 MED ORDER — SODIUM CHLORIDE 0.9 % IV SOLN
INTRAVENOUS | Status: DC
  Administered 2016-12-12 (×2): via INTRAVENOUS

## 2016-12-12 MED ORDER — PHENYLEPHRINE HCL 10 MG/ML IJ SOLN
INTRAMUSCULAR | Status: DC | PRN
  Administered 2016-12-12 (×4): 100 ug via INTRAVENOUS

## 2016-12-12 MED ORDER — FENTANYL CITRATE (PF) 100 MCG/2ML IJ SOLN
INTRAMUSCULAR | Status: DC | PRN
  Administered 2016-12-12 (×2): 50 ug via INTRAVENOUS

## 2016-12-12 MED ORDER — PROPOFOL 500 MG/50ML IV EMUL
INTRAVENOUS | Status: DC | PRN
  Administered 2016-12-12: 140 ug/kg/min via INTRAVENOUS

## 2016-12-12 MED ORDER — MIDAZOLAM HCL 5 MG/5ML IJ SOLN
INTRAMUSCULAR | Status: DC | PRN
  Administered 2016-12-12 (×2): 1 mg via INTRAVENOUS

## 2016-12-12 NOTE — Op Note (Signed)
[facility] Patient Name: Amanda Duke Procedure Date: 12/12/2016 8:27 AM MRN: [id number] Account #: [id number] Date of Birth: 1940/10/07 Admit Type: Outpatient Age: 76 Room: [facility] ENDO ROOM 1 Gender: Female Note Status: Finalized Procedure:            Upper GI endoscopy Indications:          Abdominal pain in the left upper quadrant Providers:            Lollie Sails, MD Referring MD:         Lavera Guise, MD (Referring MD) Complications:        No immediate complications. Procedure:            Pre-Anesthesia Assessment:                       - ASA Grade Assessment: III - A patient with severe                        systemic disease.                       After obtaining informed consent, the endoscope was                        passed under direct vision. Throughout the procedure,                        the patient's blood pressure, pulse, and oxygen                        saturations were monitored continuously. The Endoscope                        was introduced through the mouth, and advanced to the                        third part of duodenum. The upper GI endoscopy was                        accomplished without difficulty. The patient tolerated                        the procedure well. The patient tolerated the procedure                        well. Findings:      The lower third of the esophagus was significantly tortuous.      A medium-sized sliding hiatal hernia was present.      Diffuse minimal inflammation characterized by congestion (edema) and       erythema was found in the gastric body. Biopsies were taken with a cold       forceps for histology.      The Z-line was regular. Biopsies were taken with a cold forceps for       histology.      The examined duodenum was normal. Impression:           - Tortuous esophagus.                       - Medium-sized hiatal hernia.                       -  Gastritis. Biopsied.                   - Z-line regular. Biopsied.                       - Normal examined duodenum. Recommendation:       - Perform a colonoscopy today. Procedure Code(s):    --- Professional ---                       [phone], Esophagogastroduodenoscopy, flexible, transoral;                        with biopsy, single or multiple Diagnosis Code(s):    --- Professional ---                       Q39.9, Congenital malformation of esophagus, unspecified                       K44.9, Diaphragmatic hernia without obstruction or                        gangrene                       K29.70, Gastritis, unspecified, without bleeding                       R10.12, Left upper quadrant pain CPT copyright 2016 American Medical Association. All rights reserved. The codes documented in this report are preliminary and upon coder review may  be revised to meet current compliance requirements. Lollie Sails, MD 12/12/2016 9:21:10 AM This report has been signed electronically. Number of Addenda: 0 Note Initiated On: 12/12/2016 8:27 AM      [facility]

## 2016-12-12 NOTE — Anesthesia Post-op Follow-up Note (Signed)
Anesthesia QCDR form completed.        

## 2016-12-12 NOTE — H&P (Signed)
Outpatient short stay form Pre-procedure 12/12/2016 8:34 AM Lollie Sails MD  Primary Physician: Dr Clayborn Bigness  Reason for visit:  EGD and colonoscopy  History of present illness:  Patient is a 76 year old female presenting today as above. She had a CT scan for abdominal pain on 09/27/2016. At that time she was found to have a large hiatal hernia as well as some possible fold and mural thickening consistent with enteritis in the duodenum and jejunum. There is no evidence of bowel obstruction. Since that time she has been placed on Linzess for her probable constipation and much of her abdominal symptoms have resolved. She had a failed colonoscopy due to discomfort a number of years ago. She tolerated her prep well. She takes no aspirin or blood thinning agents.    Current Facility-Administered Medications:  .  0.9 %  sodium chloride infusion, , Intravenous, Continuous, Lollie Sails, MD .  0.9 %  sodium chloride infusion, , Intravenous, Continuous, Lollie Sails, MD  Prescriptions Prior to Admission  Medication Sig Dispense Refill Last Dose  . cholecalciferol (VITAMIN D) 1000 units tablet Take 1,000 Units by mouth daily.   Past Week at Unknown time  . clonazePAM (KLONOPIN) 0.5 MG tablet Take 0.25 mg by mouth 2 (two) times daily as needed for anxiety.   Past Week at Unknown time  . fluticasone (FLONASE) 50 MCG/ACT nasal spray Place into the nose.   12/11/2016 at Unknown time  . linaclotide (LINZESS) 145 MCG CAPS capsule Take 145 mcg by mouth daily before breakfast.   12/11/2016 at Unknown time  . losartan (COZAAR) 25 MG tablet Take 25 mg by mouth daily.   12/11/2016 at Unknown time  . omega-3 acid ethyl esters (LOVAZA) 1 g capsule Take by mouth.   Past Week at Unknown time  . oxybutynin (DITROPAN) 5 MG tablet Take 5 mg by mouth.   Past Week at Unknown time  . pantoprazole (PROTONIX) 40 MG tablet Take 40 mg by mouth daily.   12/11/2016 at Unknown time  . rosuvastatin (CRESTOR) 20 MG  tablet Take 20 mg by mouth daily.  2 12/11/2016 at Unknown time  . diltiazem (CARDIZEM CD) 120 MG 24 hr capsule Take 120 mg by mouth daily.    Taking     Allergies  Allergen Reactions  . Amoxicillin Hives  . Clindamycin Diarrhea  . Gemfibrozil Other (See Comments)    muscle ache  . Levofloxacin Other (See Comments)  . Levofloxacin In D5w Other (See Comments)  . Metoprolol Other (See Comments)  . Simvastatin Other (See Comments)    muscle ache  . Sulfa Antibiotics Other (See Comments)  . Nystatin-Triamcinolone Rash     Past Medical History:  Diagnosis Date  . Anxiety   . Coronary artery disease   . GERD (gastroesophageal reflux disease)   . Hyperlipemia   . Hypertension   . Irregular heart beat   . Pre-diabetes   . Prediabetes   . Renal cyst    bilateral    Review of systems:      Physical Exam    Heart and lungs: Regular rate and rhythm without rub or gallop, lungs are bilaterally clear.    HEENT: Normocephalic atraumatic eyes are anicteric    Other:     Pertinant exam for procedure: Soft nontender nondistended bowel sounds positive normoactive.    Planned proceedures: EGD, colonoscopy and indicated procedures. I have discussed the risks benefits and complications of procedures to include not limited to bleeding, infection,  perforation and the risk of sedation and the patient wishes to proceed.    Lollie Sails, MD Gastroenterology 12/12/2016  8:34 AM

## 2016-12-12 NOTE — Anesthesia Postprocedure Evaluation (Signed)
Anesthesia Post Note  Patient: Amanda Duke  Procedure(s) Performed: Procedure(s) (LRB): ESOPHAGOGASTRODUODENOSCOPY (EGD) WITH PROPOFOL (N/A) COLONOSCOPY WITH PROPOFOL (N/A)  Patient location during evaluation: PACU Anesthesia Type: General Level of consciousness: awake Pain management: pain level controlled Vital Signs Assessment: post-procedure vital signs reviewed and stable Respiratory status: spontaneous breathing Cardiovascular status: stable Anesthetic complications: no     Last Vitals:  Vitals:   12/12/16 1053 12/12/16 1055  BP: (!) 155/93 (!) 155/93  Pulse: 70 67  Resp: 19 (!) 22  Temp: (!) 36 C (!) 36 C  SpO2: 100% 100%    Last Pain:  Vitals:   12/12/16 1053  TempSrc: Tympanic                 VAN STAVEREN,Linford Quintela

## 2016-12-12 NOTE — Op Note (Addendum)
[facility] Patient Name: Amanda Duke Procedure Date: 12/12/2016 9:21 AM MRN: [id number] Account #: [id number] Date of Birth: 23-May-1940 Admit Type: Outpatient Age: 76 Room: [facility] ENDO ROOM 1 Gender: Female Note Status: Finalized Procedure:            Colonoscopy Indications:          Screening for colorectal malignant neoplasm Providers:            Lollie Sails, MD Medicines:            Monitored Anesthesia Care Complications:        No immediate complications. Procedure:            Pre-Anesthesia Assessment:                       - ASA Grade Assessment: III - A patient with severe                        systemic disease.                       After obtaining informed consent, the colonoscope was                        passed under direct vision. Throughout the procedure,                        the patient's blood pressure, pulse, and oxygen                        saturations were monitored continuously. The                        Colonoscope was introduced through the anus and                        advanced to the the cecum, identified by appendiceal                        orifice and ileocecal valve. The colonoscopy was                        unusually difficult due to significant looping and a                        tortuous colon. Successful completion of the procedure                        was aided by changing the patient to a supine position,                        changing the patient to a prone position, using manual                        pressure and withdrawing and reinserting the scope. The                        patient tolerated the procedure well. The quality of                        the bowel  preparation was good. Findings:      The colon (entire examined portion) was significantly redundant. It is       of note that the light was seen in transillumination in the low right       lower quadrant in two locations on the  pathway.      Multiple medium-mouthed diverticula were found in the sigmoid colon and       descending colon.      A 16 mm polyp was found in the splenic flexure. The polyp was sessile.       Polypectomy was attempted, initially using a cold snare. Polyp resection       was incomplete with this device. This intervention then required a       different device and polypectomy technique. The polyp was removed with a       cold biopsy forceps. Resection and retrieval were complete. Area was       tattooed with an injection of 2 mL of [place] ink.      A 12 mm polyp was found in the transverse colon. The polyp was sessile.       The polyp was removed with a cold biopsy forceps. The polyp was removed       with a cold snare. Resection and retrieval were complete.      A 4 mm polyp was found in the distal descending colon. The polyp was       sessile. The polyp was removed with a cold biopsy forceps. Resection and       retrieval were complete.      The retroflexed view of the distal rectum and anal verge was normal and       showed no anal or rectal abnormalities.      The digital rectal exam was normal. Impression:           - Redundant colon.                       - Diverticulosis in the sigmoid colon and in the                        descending colon.                       - One 14 mm polyp at the splenic flexure, removed with                        a cold biopsy forceps. Resected and retrieved. Tattooed.                       - One 12 mm polyp in the transverse colon, removed with                        a cold snare and removed with a cold biopsy forceps.                        Resected and retrieved.                       - One 4 mm polyp in the distal descending colon,                        removed with a  cold biopsy forceps. Resected and                        retrieved. Recommendation:       - Await pathology results.                       - Return to GI clinic in 3 weeks.                        - Full liquid diet today.                       - Full liquid diet for 2 days, then advance as                        tolerated to low residue diet for 2 days. Procedure Code(s):    --- Professional ---                       [phone], Colonoscopy, flexible; with removal of tumor(s),                        polyp(s), or other lesion(s) by snare technique                       45381, Colonoscopy, flexible; with directed submucosal                        injection(s), any substance                       60454, 56, Colonoscopy, flexible; with biopsy, single                        or multiple Diagnosis Code(s):    --- Professional ---                       Z12.11, Encounter for screening for malignant neoplasm                        of colon                       D12.3, Benign neoplasm of transverse colon (hepatic                        flexure or splenic flexure)                       D12.4, Benign neoplasm of descending colon                       K57.30, Diverticulosis of large intestine without                        perforation or abscess without bleeding                       Q43.8, Other specified congenital malformations of                        intestine CPT copyright 2016 American Medical Association. All rights reserved. The  codes documented in this report are preliminary and upon coder review may  be revised to meet current compliance requirements. Lollie Sails, MD 12/12/2016 10:54:58 AM This report has been signed electronically. Number of Addenda: 0 Note Initiated On: 12/12/2016 9:21 AM Scope Withdrawal Time: 0 hours 34 minutes 30 seconds  Total Procedure Duration: 1 hour 22 minutes 25 seconds       [facility]

## 2016-12-12 NOTE — Transfer of Care (Signed)
Immediate Anesthesia Transfer of Care Note  Patient: Amanda Duke  Procedure(s) Performed: Procedure(s): ESOPHAGOGASTRODUODENOSCOPY (EGD) WITH PROPOFOL (N/A) COLONOSCOPY WITH PROPOFOL (N/A)  Patient Location: PACU and Endoscopy Unit  Anesthesia Type:General  Level of Consciousness: awake and drowsy  Airway & Oxygen Therapy: Patient Spontanous Breathing and Patient connected to nasal cannula oxygen  Post-op Assessment: Report given to RN and Post -op Vital signs reviewed and stable  Post vital signs: Reviewed and stable  Last Vitals:  Vitals:   12/12/16 0808  BP: (!) 164/90  Pulse: 82  Resp: 18  Temp: (!) 36.3 C  SpO2: 98%    Last Pain:  Vitals:   12/12/16 0808  TempSrc: Tympanic         Complications: No apparent anesthesia complications

## 2016-12-12 NOTE — Anesthesia Preprocedure Evaluation (Signed)
Anesthesia Evaluation  Patient identified by MRN, date of birth, ID band Patient awake    Reviewed: Allergy & Precautions, H&P , NPO status , Patient's Chart, lab work & pertinent test results, reviewed documented beta blocker date and time   Airway Mallampati: II   Neck ROM: full    Dental  (+) Poor Dentition, Teeth Intact   Pulmonary neg pulmonary ROS, former smoker,    Pulmonary exam normal        Cardiovascular hypertension, + CAD  negative cardio ROS Normal cardiovascular examAtrial Fibrillation  Rhythm:regular Rate:Normal     Neuro/Psych negative neurological ROS  negative psych ROS   GI/Hepatic negative GI ROS, Neg liver ROS, GERD  Medicated,  Endo/Other  negative endocrine ROS  Renal/GU Renal diseasenegative Renal ROS  negative genitourinary   Musculoskeletal   Abdominal   Peds  Hematology negative hematology ROS (+)   Anesthesia Other Findings Past Medical History: No date: Anxiety No date: Coronary artery disease No date: GERD (gastroesophageal reflux disease) No date: Hyperlipemia No date: Hypertension No date: Irregular heart beat No date: Pre-diabetes No date: Prediabetes No date: Renal cyst     Comment:  bilateral Past Surgical History: 1971: APPENDECTOMY 1980: ARTHRODESIS 1980: BACK SURGERY 1985: LAPAROSCOPIC UNILATERAL SALPINGO OOPHERECTOMY     Comment:  right 1948: TONSILLECTOMY 1971: TUBAL LIGATION 1985: VAGINAL HYSTERECTOMY     Comment:  d/t heavy bleeding BMI    Body Mass Index:  29.54 kg/m     Reproductive/Obstetrics negative OB ROS                             Anesthesia Physical Anesthesia Plan  ASA: III  Anesthesia Plan: General   Post-op Pain Management:    Induction:   PONV Risk Score and Plan:   Airway Management Planned:   Additional Equipment:   Intra-op Plan:   Post-operative Plan:   Informed Consent: I have reviewed the  patients History and Physical, chart, labs and discussed the procedure including the risks, benefits and alternatives for the proposed anesthesia with the patient or authorized representative who has indicated his/her understanding and acceptance.   Dental Advisory Given  Plan Discussed with: CRNA  Anesthesia Plan Comments:         Anesthesia Quick Evaluation

## 2016-12-14 LAB — SURGICAL PATHOLOGY

## 2016-12-25 DIAGNOSIS — I10 Essential (primary) hypertension: Secondary | ICD-10-CM | POA: Diagnosis not present

## 2016-12-25 DIAGNOSIS — N390 Urinary tract infection, site not specified: Secondary | ICD-10-CM | POA: Diagnosis not present

## 2016-12-25 DIAGNOSIS — K5900 Constipation, unspecified: Secondary | ICD-10-CM | POA: Diagnosis not present

## 2017-01-15 DIAGNOSIS — H2513 Age-related nuclear cataract, bilateral: Secondary | ICD-10-CM | POA: Diagnosis not present

## 2017-01-16 ENCOUNTER — Other Ambulatory Visit: Payer: Self-pay | Admitting: Gastroenterology

## 2017-01-16 DIAGNOSIS — R933 Abnormal findings on diagnostic imaging of other parts of digestive tract: Secondary | ICD-10-CM | POA: Diagnosis not present

## 2017-01-16 DIAGNOSIS — K210 Gastro-esophageal reflux disease with esophagitis: Secondary | ICD-10-CM | POA: Diagnosis not present

## 2017-01-16 DIAGNOSIS — D369 Benign neoplasm, unspecified site: Secondary | ICD-10-CM | POA: Diagnosis not present

## 2017-01-21 DIAGNOSIS — Z1231 Encounter for screening mammogram for malignant neoplasm of breast: Secondary | ICD-10-CM | POA: Diagnosis not present

## 2017-01-22 ENCOUNTER — Ambulatory Visit
Admission: RE | Admit: 2017-01-22 | Discharge: 2017-01-22 | Disposition: A | Discharge status: Home / Self Care | Payer: Medicare Other | Source: Ambulatory Visit | Attending: Gastroenterology | Admitting: Gastroenterology

## 2017-01-22 DIAGNOSIS — R933 Abnormal findings on diagnostic imaging of other parts of digestive tract: Secondary | ICD-10-CM | POA: Insufficient documentation

## 2017-01-22 DIAGNOSIS — K219 Gastro-esophageal reflux disease without esophagitis: Secondary | ICD-10-CM | POA: Diagnosis not present

## 2017-01-22 DIAGNOSIS — K449 Diaphragmatic hernia without obstruction or gangrene: Secondary | ICD-10-CM | POA: Insufficient documentation

## 2017-02-19 DIAGNOSIS — J019 Acute sinusitis, unspecified: Secondary | ICD-10-CM | POA: Diagnosis not present

## 2017-02-19 DIAGNOSIS — I10 Essential (primary) hypertension: Secondary | ICD-10-CM | POA: Diagnosis not present

## 2017-04-03 DIAGNOSIS — K5909 Other constipation: Secondary | ICD-10-CM | POA: Diagnosis not present

## 2017-04-03 DIAGNOSIS — D369 Benign neoplasm, unspecified site: Secondary | ICD-10-CM | POA: Diagnosis not present

## 2017-04-03 DIAGNOSIS — K210 Gastro-esophageal reflux disease with esophagitis: Secondary | ICD-10-CM | POA: Diagnosis not present

## 2017-04-03 DIAGNOSIS — K449 Diaphragmatic hernia without obstruction or gangrene: Secondary | ICD-10-CM | POA: Diagnosis not present

## 2017-04-24 ENCOUNTER — Ambulatory Visit: Payer: Self-pay | Admitting: Nurse Practitioner

## 2017-04-24 DIAGNOSIS — M150 Primary generalized (osteo)arthritis: Secondary | ICD-10-CM | POA: Insufficient documentation

## 2017-04-24 DIAGNOSIS — R911 Solitary pulmonary nodule: Secondary | ICD-10-CM | POA: Insufficient documentation

## 2017-04-24 DIAGNOSIS — R7301 Impaired fasting glucose: Secondary | ICD-10-CM | POA: Insufficient documentation

## 2017-04-24 DIAGNOSIS — J019 Acute sinusitis, unspecified: Secondary | ICD-10-CM | POA: Insufficient documentation

## 2017-04-24 DIAGNOSIS — N182 Chronic kidney disease, stage 2 (mild): Secondary | ICD-10-CM | POA: Insufficient documentation

## 2017-04-24 DIAGNOSIS — M810 Age-related osteoporosis without current pathological fracture: Secondary | ICD-10-CM | POA: Insufficient documentation

## 2017-04-24 DIAGNOSIS — E559 Vitamin D deficiency, unspecified: Secondary | ICD-10-CM | POA: Insufficient documentation

## 2017-04-24 DIAGNOSIS — R002 Palpitations: Secondary | ICD-10-CM | POA: Insufficient documentation

## 2017-04-24 DIAGNOSIS — R300 Dysuria: Secondary | ICD-10-CM | POA: Insufficient documentation

## 2017-04-24 DIAGNOSIS — E2839 Other primary ovarian failure: Secondary | ICD-10-CM | POA: Insufficient documentation

## 2017-04-24 DIAGNOSIS — G4700 Insomnia, unspecified: Secondary | ICD-10-CM | POA: Insufficient documentation

## 2017-04-24 DIAGNOSIS — K529 Noninfective gastroenteritis and colitis, unspecified: Secondary | ICD-10-CM | POA: Insufficient documentation

## 2017-04-24 DIAGNOSIS — J029 Acute pharyngitis, unspecified: Secondary | ICD-10-CM | POA: Insufficient documentation

## 2017-04-25 ENCOUNTER — Other Ambulatory Visit: Payer: Self-pay | Admitting: Internal Medicine

## 2017-04-25 NOTE — Telephone Encounter (Signed)
Pt needs refill

## 2017-05-14 DIAGNOSIS — E782 Mixed hyperlipidemia: Secondary | ICD-10-CM | POA: Diagnosis not present

## 2017-05-14 DIAGNOSIS — I471 Supraventricular tachycardia: Secondary | ICD-10-CM | POA: Diagnosis not present

## 2017-05-14 DIAGNOSIS — I10 Essential (primary) hypertension: Secondary | ICD-10-CM | POA: Diagnosis not present

## 2017-05-14 DIAGNOSIS — I2510 Atherosclerotic heart disease of native coronary artery without angina pectoris: Secondary | ICD-10-CM | POA: Diagnosis not present

## 2017-07-03 ENCOUNTER — Encounter: Payer: Medicare Other | Admitting: Obstetrics and Gynecology

## 2017-07-22 DIAGNOSIS — I10 Essential (primary) hypertension: Secondary | ICD-10-CM | POA: Diagnosis not present

## 2017-07-22 DIAGNOSIS — Z882 Allergy status to sulfonamides status: Secondary | ICD-10-CM | POA: Diagnosis not present

## 2017-07-22 DIAGNOSIS — K449 Diaphragmatic hernia without obstruction or gangrene: Secondary | ICD-10-CM | POA: Diagnosis not present

## 2017-07-22 DIAGNOSIS — K219 Gastro-esophageal reflux disease without esophagitis: Secondary | ICD-10-CM | POA: Diagnosis not present

## 2017-07-22 DIAGNOSIS — Z87891 Personal history of nicotine dependence: Secondary | ICD-10-CM | POA: Diagnosis not present

## 2017-07-22 DIAGNOSIS — N8111 Cystocele, midline: Secondary | ICD-10-CM | POA: Diagnosis not present

## 2017-07-22 DIAGNOSIS — N3020 Other chronic cystitis without hematuria: Secondary | ICD-10-CM | POA: Diagnosis not present

## 2017-07-22 DIAGNOSIS — Z880 Allergy status to penicillin: Secondary | ICD-10-CM | POA: Diagnosis not present

## 2017-07-22 DIAGNOSIS — E785 Hyperlipidemia, unspecified: Secondary | ICD-10-CM | POA: Diagnosis not present

## 2017-07-22 DIAGNOSIS — N281 Cyst of kidney, acquired: Secondary | ICD-10-CM | POA: Diagnosis not present

## 2017-07-22 DIAGNOSIS — Z7982 Long term (current) use of aspirin: Secondary | ICD-10-CM | POA: Diagnosis not present

## 2017-08-06 ENCOUNTER — Telehealth: Payer: Self-pay | Admitting: Internal Medicine

## 2017-08-06 MED ORDER — CLONAZEPAM 0.5 MG PO TABS: ORAL_TABLET | ORAL | 1 refills | Status: DC

## 2017-08-06 NOTE — Telephone Encounter (Signed)
rx [facility]ill called [facility]

## 2017-09-11 ENCOUNTER — Other Ambulatory Visit: Payer: Self-pay | Admitting: Internal Medicine

## 2017-09-12 ENCOUNTER — Ambulatory Visit (INDEPENDENT_AMBULATORY_CARE_PROVIDER_SITE_OTHER): Payer: Medicare Other | Admitting: Nurse Practitioner

## 2017-09-12 ENCOUNTER — Encounter: Payer: Self-pay | Admitting: Nurse Practitioner

## 2017-09-12 VITALS — BP 166/86 | HR 74 | Temp 96.1°F | Resp 16 | Ht 65.0 in | Wt 186.0 lb

## 2017-09-12 DIAGNOSIS — N390 Urinary tract infection, site not specified: Secondary | ICD-10-CM | POA: Diagnosis not present

## 2017-09-12 DIAGNOSIS — I10 Essential (primary) hypertension: Secondary | ICD-10-CM

## 2017-09-12 DIAGNOSIS — R300 Dysuria: Secondary | ICD-10-CM

## 2017-09-12 LAB — POCT URINALYSIS DIPSTICK
Bilirubin, UA: NEGATIVE
Blood, UA: NEGATIVE
GLUCOSE UA: NEGATIVE
Ketones, UA: NEGATIVE
Nitrite, UA: NEGATIVE
Protein, UA: NEGATIVE
Urobilinogen, UA: 0.2 E.U./dL
pH, UA: 7.5 (ref 5.0–8.0)

## 2017-09-12 MED ORDER — CIPROFLOXACIN HCL 500 MG PO TABS: 500.0000 mg | ORAL_TABLET | Freq: Two times a day (BID) | ORAL | 0 refills | Status: DC

## 2017-09-12 MED ORDER — PHENAZOPYRIDINE HCL 200 MG PO TABS: 200.0000 mg | ORAL_TABLET | Freq: Three times a day (TID) | ORAL | 0 refills | Status: DC | PRN

## 2017-09-12 NOTE — Progress Notes (Signed)
[facility] [place], [place] [address]  Internal MEDICINE  Office Visit Note  Patient Name: Amanda Duke  [id number]  [id number]  Date of Service: 09/12/2017  Chief Complaint  Patient presents with  . Urinary Tract Infection     Urinary Tract Infection   This is a new problem. The current episode started in the past 7 days. The problem occurs every urination. The problem has been unchanged. The quality of the pain is described as aching. The pain is at a severity of 2/10. The pain is mild. She is not sexually active. There is no history of pyelonephritis. Associated symptoms include flank pain, frequency, nausea and urgency. Pertinent negatives include no chills or vomiting. She has tried increased fluids for the symptoms. The treatment provided mild relief.   Pt is here for a sick visit.     Current Medication:  Outpatient Encounter Medications as of 09/12/2017  Medication Sig  . cholecalciferol (VITAMIN D) 1000 units tablet Take 1,000 Units by mouth daily.  . clonazePAM (KLONOPIN) 0.5 MG tablet TAKE 1 TABLET BY MOUTH AT BEDTIME AS NEEDED INSOMNIA/ANXIETY  . fluticasone (FLONASE) 50 MCG/ACT nasal spray Place into the nose.  Amanda Duke Ethyl (VASCEPA) 0.5 g CAPS Take by mouth.  . linaclotide (LINZESS) 145 MCG CAPS capsule Take 145 mcg by mouth daily before breakfast.  . losartan (COZAAR) 25 MG tablet Take 25 mg by mouth daily.  Marland Kitchen oxybutynin (DITROPAN) 5 MG tablet Take 5 mg by mouth.  . pantoprazole (PROTONIX) 40 MG tablet Take 40 mg by mouth daily.  . rosuvastatin (CRESTOR) 20 MG tablet TAKE 1 TABLET BY MOUTH EVERY DAY  . ciprofloxacin (CIPRO) 500 MG tablet Take 1 tablet (500 mg total) by mouth 2 (two) times daily.  Marland Kitchen diltiazem (CARDIZEM CD) 120 MG 24 hr capsule Take 120 mg by mouth daily.   Marland Kitchen omega-3 acid ethyl esters (LOVAZA) 1 g capsule Take by mouth.  . phenazopyridine (PYRIDIUM) 200 MG tablet Take 1 tablet (200 mg total) by mouth 3 (three)  times daily as needed for pain.   No facility-administered encounter medications on file as of 09/12/2017.       Medical History: Past Medical History:  Diagnosis Date  . Anxiety   . Coronary artery disease   . GERD (gastroesophageal reflux disease)   . Hyperlipemia   . Hypertension   . Irregular heart beat   . Pre-diabetes   . Prediabetes   . Renal cyst    bilateral     Vital Signs: BP (!) 166/86   Pulse 74   Temp (!) 96.1 F (35.6 C)   Resp 16   Ht 5\' 5"  (1.651 m)   Wt 186 lb (84.4 kg)   SpO2 97%   BMI 30.95 kg/m    Review of Systems  Constitutional: Negative for chills, fatigue and unexpected weight change.  HENT: Negative for congestion, postnasal drip, rhinorrhea, sneezing and sore throat.   Eyes: Negative.  Negative for redness.  Respiratory: Negative for cough, chest tightness, shortness of breath and wheezing.   Cardiovascular: Negative for chest pain and palpitations.  Gastrointestinal: Positive for nausea. Negative for abdominal pain, constipation, diarrhea and vomiting.  Endocrine: Negative for cold intolerance, heat intolerance, polydipsia, polyphagia and polyuria.  Genitourinary: Positive for flank pain, frequency and urgency. Negative for dysuria.  Musculoskeletal: Negative for arthralgias, back pain, joint swelling and neck pain.  Skin: Negative for rash.  Allergic/Immunologic: Negative for environmental allergies.  Neurological: Positive for headaches.  Negative for tremors and numbness.  Hematological: Negative for adenopathy. Does not bruise/bleed easily.  Psychiatric/Behavioral: Negative for behavioral problems (Depression), sleep disturbance and suicidal ideas. The patient is not nervous/anxious.     Physical Exam  Constitutional: She is oriented to person, place, and time. She appears well-developed and well-nourished. No distress.  HENT:  Head: Normocephalic and atraumatic.  Mouth/Throat: Oropharynx is clear and moist. No oropharyngeal  exudate.  Eyes: Pupils are equal, round, and reactive to light. EOM are normal.  Neck: Normal range of motion. Neck supple. No JVD present. No tracheal deviation present. No thyromegaly present.  Cardiovascular: Normal rate, regular rhythm and normal heart sounds. Exam reveals no gallop and no friction rub.  No murmur heard. Pulmonary/Chest: Effort normal and breath sounds normal. No respiratory distress. She has no wheezes. She has no rales. She exhibits no tenderness.  Abdominal: Soft. Bowel sounds are normal. There is no tenderness.  Genitourinary:  Genitourinary Comments: U/a positive for large WBC  Musculoskeletal: Normal range of motion.  Lymphadenopathy:    She has no cervical adenopathy.  Neurological: She is alert and oriented to person, place, and time. No cranial nerve deficit.  Skin: Skin is warm and dry. She is not diaphoretic.  Psychiatric: She has a normal mood and affect. Her behavior is normal. Judgment and thought content normal.   Assessment/Plan: 1. Urinary tract infection without hematuria, site unspecified Start cipro 500mg  bid for 10 days. Adjust abx based on results of culture and sensitivity.  - CULTURE, URINE COMPREHENSIVE - ciprofloxacin (CIPRO) 500 MG tablet; Take 1 tablet (500 mg total) by mouth 2 (two) times daily.  Dispense: 20 tablet; Refill: 0  2. Dysuria Pyridium may be taken TID if needed for bladder pain/spasms.  - POCT Urinalysis Dipstick - phenazopyridine (PYRIDIUM) 200 MG tablet; Take 1 tablet (200 mg total) by mouth 3 (three) times daily as needed for pain.  Dispense: 10 tablet; Refill: 0  3. Benign essential HTN Continue bp medication as prescribed    General Counseling: Amanda Duke verbalizes understanding of the findings of todays visit and agrees with plan of treatment. I have discussed any further diagnostic evaluation that may be needed or ordered today. We also reviewed her medications today. she has been encouraged to call the office with any  questions or concerns that should arise related to todays visit.  Rest and increase fluids. Continue using OTC medication to control symptoms.   This patient was seen by Leretha Pol, FNP- C in Collaboration with Dr Lavera Guise as a part of collaborative care agreement    Orders Placed This Encounter  Procedures  . CULTURE, URINE COMPREHENSIVE  . POCT Urinalysis Dipstick    Meds ordered this encounter  Medications  . ciprofloxacin (CIPRO) 500 MG tablet    Sig: Take 1 tablet (500 mg total) by mouth 2 (two) times daily.    Dispense:  20 tablet    Refill:  0    Order Specific Question:   Supervising Provider    Answer:   Lavera Guise [[id number]]  . phenazopyridine (PYRIDIUM) 200 MG tablet    Sig: Take 1 tablet (200 mg total) by mouth 3 (three) times daily as needed for pain.    Dispense:  10 tablet    Refill:  0    Order Specific Question:   Supervising Provider    Answer:   Lavera Guise [[id number]]    Time spent: 15 Minutes

## 2017-09-16 LAB — CULTURE, URINE COMPREHENSIVE

## 2017-10-16 ENCOUNTER — Telehealth: Payer: Self-pay | Admitting: Obstetrics and Gynecology

## 2017-10-16 NOTE — Telephone Encounter (Signed)
The patient called to reschedule her missed March appointment for ovarian CA followup, but needed to know if she needs to schedule an ultrasound prior to her visit that day.  She is desiring a "first available" appointment, and asked to be called at her home number as she will be there today and in the morning, at [phone]. please advise, thank you

## 2017-10-21 NOTE — Telephone Encounter (Signed)
Pt aware. U/s and f/u scheduled.

## 2017-10-22 ENCOUNTER — Other Ambulatory Visit: Payer: Self-pay | Admitting: Obstetrics and Gynecology

## 2017-10-22 DIAGNOSIS — Z8041 Family history of malignant neoplasm of ovary: Secondary | ICD-10-CM

## 2017-10-23 ENCOUNTER — Ambulatory Visit (INDEPENDENT_AMBULATORY_CARE_PROVIDER_SITE_OTHER): Payer: Medicare Other

## 2017-10-23 DIAGNOSIS — Z8041 Family history of malignant neoplasm of ovary: Secondary | ICD-10-CM

## 2017-10-29 ENCOUNTER — Encounter: Payer: Medicare Other | Admitting: Obstetrics and Gynecology

## 2017-12-08 DIAGNOSIS — R002 Palpitations: Secondary | ICD-10-CM | POA: Diagnosis not present

## 2017-12-08 DIAGNOSIS — I2510 Atherosclerotic heart disease of native coronary artery without angina pectoris: Secondary | ICD-10-CM | POA: Diagnosis not present

## 2017-12-08 DIAGNOSIS — I10 Essential (primary) hypertension: Secondary | ICD-10-CM | POA: Diagnosis not present

## 2017-12-08 DIAGNOSIS — E782 Mixed hyperlipidemia: Secondary | ICD-10-CM | POA: Diagnosis not present

## 2017-12-09 ENCOUNTER — Other Ambulatory Visit: Payer: Self-pay | Admitting: Internal Medicine

## 2017-12-15 ENCOUNTER — Ambulatory Visit (INDEPENDENT_AMBULATORY_CARE_PROVIDER_SITE_OTHER): Payer: Medicare Other | Admitting: Nurse Practitioner

## 2017-12-15 ENCOUNTER — Encounter: Payer: Self-pay | Admitting: Nurse Practitioner

## 2017-12-15 VITALS — BP 140/80 | HR 73 | Resp 16 | Ht 65.0 in | Wt 183.4 lb

## 2017-12-15 DIAGNOSIS — R300 Dysuria: Secondary | ICD-10-CM | POA: Diagnosis not present

## 2017-12-15 DIAGNOSIS — Z23 Encounter for immunization: Secondary | ICD-10-CM

## 2017-12-15 DIAGNOSIS — F411 Generalized anxiety disorder: Secondary | ICD-10-CM

## 2017-12-15 DIAGNOSIS — I10 Essential (primary) hypertension: Secondary | ICD-10-CM

## 2017-12-15 DIAGNOSIS — Z1231 Encounter for screening mammogram for malignant neoplasm of breast: Secondary | ICD-10-CM

## 2017-12-15 DIAGNOSIS — Z1239 Encounter for other screening for malignant neoplasm of breast: Secondary | ICD-10-CM

## 2017-12-15 DIAGNOSIS — R002 Palpitations: Secondary | ICD-10-CM | POA: Diagnosis not present

## 2017-12-15 DIAGNOSIS — Z0001 Encounter for general adult medical examination with abnormal findings: Secondary | ICD-10-CM

## 2017-12-15 DIAGNOSIS — I2510 Atherosclerotic heart disease of native coronary artery without angina pectoris: Secondary | ICD-10-CM | POA: Diagnosis not present

## 2017-12-15 MED ORDER — CLONAZEPAM 0.5 MG PO TABS
ORAL_TABLET | ORAL | 3 refills | Status: DC
Start: 2017-12-15 — End: 2018-04-16

## 2017-12-15 MED ORDER — ZOSTER VAC RECOMB ADJUVANTED 50 MCG/0.5ML IM SUSR: INTRAMUSCULAR | 1 refills | Status: DC

## 2017-12-15 NOTE — Progress Notes (Signed)
[facility] [place], [place] [address]  Internal MEDICINE  Office Visit Note  Patient Name: Amanda Duke  [id number]  [id number]  Date of Service: 12/24/2017   Pt is here for routine health maintenance examination  Chief Complaint  Patient presents with  . Annual Exam  . Hypertension     Hypertension  This is a chronic problem. The current episode started more than 1 year ago. The problem is unchanged. The problem is controlled. Associated symptoms include headaches. Pertinent negatives include no chest pain, neck pain, palpitations or shortness of breath. There are no associated agents to hypertension. Risk factors for coronary artery disease include dyslipidemia, family history and post-menopausal state. Past treatments include angiotensin blockers and calcium channel blockers. The current treatment provides moderate improvement. There are no compliance problems.      Current Medication: Outpatient Encounter Medications as of 12/15/2017  Medication Sig  . cholecalciferol (VITAMIN D) 1000 units tablet Take 1,000 Units by mouth daily.  . clonazePAM (KLONOPIN) 0.5 MG tablet TAKE 1 TABLET BY MOUTH AT BEDTIME AS NEEDED INSOMNIA/ANXIETY  . fluticasone (FLONASE) 50 MCG/ACT nasal spray Place into the nose.  Vanessa Kick Ethyl (VASCEPA) 0.5 g CAPS Take by mouth.  Marland Kitchen LINZESS 145 MCG CAPS capsule TAKE ONE CAPSULE BY MOUTH DAILY AS NEEDED  . losartan (COZAAR) 25 MG tablet Take 25 mg by mouth daily.  Marland Kitchen omega-3 acid ethyl esters (LOVAZA) 1 g capsule Take by mouth.  . Omega-3 Fatty Acids (FISH OIL PO) Take by mouth.  . oxybutynin (DITROPAN) 5 MG tablet Take 5 mg by mouth.  . pantoprazole (PROTONIX) 40 MG tablet Take 40 mg by mouth daily.  . phenazopyridine (PYRIDIUM) 200 MG tablet Take 1 tablet (200 mg total) by mouth 3 (three) times daily as needed for pain.  . [DISCONTINUED] clonazePAM (KLONOPIN) 0.5 MG tablet TAKE 1 TABLET BY MOUTH AT BEDTIME AS NEEDED  INSOMNIA/ANXIETY  . [DISCONTINUED] rosuvastatin (CRESTOR) 20 MG tablet TAKE 1 TABLET BY MOUTH EVERY DAY  . ciprofloxacin (CIPRO) 500 MG tablet Take 1 tablet (500 mg total) by mouth 2 (two) times daily. (Patient not taking: Reported on 12/15/2017)  . diltiazem (CARDIZEM CD) 120 MG 24 hr capsule Take 120 mg by mouth daily.   Marland Kitchen Zoster Vaccine Adjuvanted [facility]) injection Shingles vaccine series - inject IM as directed .   No facility-administered encounter medications on file as of 12/15/2017.     Surgical History: Past Surgical History:  Procedure Laterality Date  . APPENDECTOMY  1971  . ARTHRODESIS  1980  . BACK SURGERY  1980  . COLONOSCOPY WITH PROPOFOL N/A 12/12/2016   Procedure: COLONOSCOPY WITH PROPOFOL;  Surgeon: Lollie Sails, MD;  Location: [facility] ENDOSCOPY;  Service: Endoscopy;  Laterality: N/A;  . ESOPHAGOGASTRODUODENOSCOPY (EGD) WITH PROPOFOL N/A 12/12/2016   Procedure: ESOPHAGOGASTRODUODENOSCOPY (EGD) WITH PROPOFOL;  Surgeon: Lollie Sails, MD;  Location: [facility] ENDOSCOPY;  Service: Endoscopy;  Laterality: N/A;  . [place]   right  . TONSILLECTOMY  1948  . TUBAL LIGATION  1971  . VAGINAL HYSTERECTOMY  1985   d/t heavy bleeding    Medical History: Past Medical History:  Diagnosis Date  . Anxiety   . Coronary artery disease   . GERD (gastroesophageal reflux disease)   . Hyperlipemia   . Hypertension   . Irregular heart beat   . Pre-diabetes   . Prediabetes   . Renal cyst    bilateral    Family History: Family  History  Problem Relation Age of Onset  . Heart failure Mother   . Stroke Mother   . Breast cancer Mother        dx at age 63 yo  . Diabetes Mother   . Osteoporosis Mother   . Emphysema Father   . Ovarian cancer Sister        dx at age 79 yo  . Asthma Sister       Review of Systems  Constitutional: Negative for chills, fatigue and unexpected weight change.  HENT: Negative for congestion,  postnasal drip, rhinorrhea, sneezing and sore throat.   Eyes: Negative.  Negative for redness.  Respiratory: Negative for cough, chest tightness, shortness of breath and wheezing.   Cardiovascular: Negative for chest pain and palpitations.  Gastrointestinal: Negative for abdominal pain, constipation, diarrhea, nausea and vomiting.  Endocrine: Negative for cold intolerance, heat intolerance, polydipsia, polyphagia and polyuria.  Genitourinary: Negative for dysuria, flank pain, frequency and urgency.  Musculoskeletal: Negative for arthralgias, back pain, joint swelling and neck pain.  Skin: Negative for rash.  Allergic/Immunologic: Negative for environmental allergies.  Neurological: Positive for headaches. Negative for tremors and numbness.  Hematological: Negative for adenopathy. Does not bruise/bleed easily.  Psychiatric/Behavioral: Negative for behavioral problems (Depression), sleep disturbance and suicidal ideas. The patient is nervous/anxious.     Today's Vitals   12/15/17 1053  BP: 140/80  Pulse: 73  Resp: 16  SpO2: 95%  Weight: 183 lb 6.4 oz (83.2 kg)  Height: 5\' 5"  (1.651 m)    Physical Exam  Constitutional: She is oriented to person, place, and time. She appears well-developed and well-nourished. No distress.  HENT:  Head: Normocephalic and atraumatic.  Nose: Nose normal.  Mouth/Throat: Oropharynx is clear and moist. No oropharyngeal exudate.  Eyes: Pupils are equal, round, and reactive to light. Conjunctivae and EOM are normal.  Neck: Normal range of motion. Neck supple. No JVD present. Carotid bruit is not present. No tracheal deviation present. No thyromegaly present.  Cardiovascular: Normal rate, regular rhythm, normal heart sounds and intact distal pulses. Exam reveals no gallop and no friction rub.  No murmur heard. Pulmonary/Chest: Effort normal and breath sounds normal. No respiratory distress. She has no wheezes. She has no rales. She exhibits no tenderness.   Abdominal: Soft. Bowel sounds are normal. There is no tenderness.  Genitourinary:  Genitourinary Comments: U/a positive for large WBC  Musculoskeletal: Normal range of motion.  Lymphadenopathy:    She has no cervical adenopathy.  Neurological: She is alert and oriented to person, place, and time. No cranial nerve deficit.  Skin: Skin is warm and dry. She is not diaphoretic.  Psychiatric: She has a normal mood and affect. Her behavior is normal. Judgment and thought content normal.     LABS: Recent Results (from the past 2160 hour(s))  UA/M w/rflx Culture, Routine     Status: Abnormal   Collection Time: 12/15/17 10:30 AM  Result Value Ref Range   Specific Gravity, UA 1.006 1.005 - 1.030   pH, UA 6.5 5.0 - 7.5   Color, UA Yellow Yellow   Appearance Ur Clear Clear   Leukocytes, UA 2+ (A) Negative   Protein, UA Negative Negative/Trace   Glucose, UA Negative Negative   Ketones, UA Negative Negative   RBC, UA Negative Negative   Bilirubin, UA Negative Negative   Urobilinogen, Ur 0.2 0.2 - 1.0 mg/dL   Nitrite, UA Negative Negative   Microscopic Examination See below:     Comment: Microscopic was indicated  and was performed.   Urinalysis Reflex Comment     Comment: This specimen has reflexed to a Urine Culture.  Microscopic Examination     Status: Abnormal   Collection Time: 12/15/17 10:30 AM  Result Value Ref Range   WBC, UA 6-10 (A) 0 - 5 /hpf   RBC, UA 0-2 0 - 2 /hpf   Epithelial Cells (non renal) 0-10 0 - 10 /hpf   Casts None seen None seen /lpf   Bacteria, UA Few None seen/Few  Urine Culture, Reflex     Status: Abnormal   Collection Time: 12/15/17 10:30 AM  Result Value Ref Range   Urine Culture, Routine Final report (A)    Organism ID, Bacteria Comment (A)     Comment: Beta hemolytic Streptococcus, group B 25,000-50,000 colony forming units per mL Penicillin and ampicillin are drugs of choice for treatment of beta-hemolytic streptococcal infections. Susceptibility  testing of penicillins and other beta-lactam agents approved by the FDA for treatment of beta-hemolytic streptococcal infections need not be performed routinely because nonsusceptible isolates are extremely rare in any beta-hemolytic streptococcus and have not been reported for Streptococcus pyogenes (group A). (CLSI)    ORGANISM ID, BACTERIA Comment     Comment: Mixed urogenital flora 10,000-25,000 colony forming units per mL    Assessment/Plan: 1. Encounter for general adult medical examination with abnormal findings Annual health maintenance exam today  2. Benign essential HTN Stable. Continue bp medication as prescribed.   3. Generalized anxiety disorder May continue clonazepam 0.5mg  at bedtime as needed. New prescription provided today.  - clonazePAM (KLONOPIN) 0.5 MG tablet; TAKE 1 TABLET BY MOUTH AT BEDTIME AS NEEDED INSOMNIA/ANXIETY  Dispense: 30 tablet; Refill: 3  4. Screening for breast cancer - MM DIGITAL SCREENING BILATERAL; Future  5. Need for shingles vaccine - Zoster Vaccine Adjuvanted [facility]) injection; Shingles vaccine series - inject IM as directed .  Dispense: 0.5 mL; Refill: 1  6. Dysuria - UA/M w/rflx Culture, Routine    General Counseling: Claramae verbalizes understanding of the findings of todays visit and agrees with plan of treatment. I have discussed any further diagnostic evaluation that may be needed or ordered today. We also reviewed her medications today. she has been encouraged to call the office with any questions or concerns that should arise related to todays visit.    Counseling:  This patient was seen by Leretha Pol FNP Collaboration with Dr Lavera Guise as a part of collaborative care agreement  Orders Placed This Encounter  Procedures  . Microscopic Examination  . Urine Culture, Reflex  . MM DIGITAL SCREENING BILATERAL  . UA/M w/rflx Culture, Routine    Meds ordered this encounter  Medications  . Zoster Vaccine Adjuvanted  [facility]) injection    Sig: Shingles vaccine series - inject IM as directed .    Dispense:  0.5 mL    Refill:  1    Order Specific Question:   Supervising Provider    Answer:   Lavera Guise [[id number]]  . clonazePAM (KLONOPIN) 0.5 MG tablet    Sig: TAKE 1 TABLET BY MOUTH AT BEDTIME AS NEEDED INSOMNIA/ANXIETY    Dispense:  30 tablet    Refill:  3    Not to exceed 2 additional fills before 06/03/2017    Order Specific Question:   Supervising Provider    Answer:   Lavera Guise [place]    Time spent:30 [place], MD  Internal Medicine

## 2017-12-16 ENCOUNTER — Other Ambulatory Visit: Payer: Self-pay

## 2017-12-16 MED ORDER — ROSUVASTATIN CALCIUM 20 MG PO TABS: 20.0000 mg | ORAL_TABLET | Freq: Every day | ORAL | 3 refills | Status: DC

## 2017-12-17 LAB — UA/M W/RFLX CULTURE, ROUTINE
Bilirubin, UA: NEGATIVE
Glucose, UA: NEGATIVE
KETONES UA: NEGATIVE
Nitrite, UA: NEGATIVE
Protein, UA: NEGATIVE
RBC, UA: NEGATIVE
Specific Gravity, UA: 1.006 (ref 1.005–1.030)
Urobilinogen, Ur: 0.2 mg/dL (ref 0.2–1.0)
pH, UA: 6.5 (ref 5.0–7.5)

## 2017-12-17 LAB — URINE CULTURE, REFLEX

## 2017-12-17 LAB — MICROSCOPIC EXAMINATION: Casts: NONE SEEN /lpf

## 2017-12-24 DIAGNOSIS — F411 Generalized anxiety disorder: Secondary | ICD-10-CM | POA: Insufficient documentation

## 2017-12-24 DIAGNOSIS — Z23 Encounter for immunization: Secondary | ICD-10-CM | POA: Insufficient documentation

## 2017-12-24 DIAGNOSIS — Z0001 Encounter for general adult medical examination with abnormal findings: Principal | ICD-10-CM

## 2017-12-24 DIAGNOSIS — I2510 Atherosclerotic heart disease of native coronary artery without angina pectoris: Secondary | ICD-10-CM | POA: Diagnosis not present

## 2017-12-24 DIAGNOSIS — Z13820 Encounter for screening for osteoporosis: Secondary | ICD-10-CM | POA: Insufficient documentation

## 2017-12-25 ENCOUNTER — Encounter: Payer: Self-pay | Admitting: Obstetrics and Gynecology

## 2017-12-25 ENCOUNTER — Ambulatory Visit (INDEPENDENT_AMBULATORY_CARE_PROVIDER_SITE_OTHER): Payer: Medicare Other | Admitting: Obstetrics and Gynecology

## 2017-12-25 VITALS — BP 143/82 | HR 79 | Ht 65.0 in | Wt 182.2 lb

## 2017-12-25 DIAGNOSIS — Z803 Family history of malignant neoplasm of breast: Secondary | ICD-10-CM | POA: Diagnosis not present

## 2017-12-25 DIAGNOSIS — Z8041 Family history of malignant neoplasm of ovary: Secondary | ICD-10-CM

## 2017-12-25 DIAGNOSIS — Z90710 Acquired absence of both cervix and uterus: Secondary | ICD-10-CM

## 2017-12-25 NOTE — Progress Notes (Signed)
Chief complaint: 1.  Ovarian cancer surveillance 2.  Family history of ovarian cancer 3.  Family history of breast cancer 4.  Status post hysterectomy  Patient presents for yearly follow-up regarding ovarian cancer surveillance. It is been a busy year for the patient as she has had follow-up with multiple specialists: Cardiology-stress echo test reportedly good (done yesterday); status post Holter monitor, awaiting results GI-colonoscopy this year demonstrated 3 polyps, 1 being precancerous; she is to have a repeat colonoscopy in 1 to 2 years GU-urology consultation is notable for renal cysts being stable  Patient reports normal bowel function while using Linzess. She reports normal kidney function. She is not experiencing any lower pelvic or lower abdominal pain; she does intermittently develop left upper quadrant sub-diaphragmatic pain, possibly related to hiatal hernia.  Ultrasound is reviewed from June 2019: Indications: Family Hx Ovarian Ca; S/P Hysterectomy & Partial Right Oophorectomy Findings:  The uterus is surgically absent.  Right Ovary measures 1.6 x 1.3 x 1.1 cm. It is normal in appearance. Left Ovary measures 2.1 x 1.6 x 1.1 cm. It is normal appearance. Survey of the adnexa demonstrates no adnexal masses. There is no free fluid in the cul de sac.  Impression: 1. Surgically absent uterus and partial right ovary. 2. Bilateral ovaries appear WNL. 3. No obvious adnexal masses  Review of systems: Comprehensive review of systems is negative except for the left upper quadrant abdominal pain is noted in the HPI  OBJECTIVE: BP (!) 143/82   Pulse 79   Ht '5\' 5"'$  (1.651 m)   Wt 182 lb 3.2 oz (82.6 kg)   BMI 30.32 kg/m  Pleasant well-appearing female in no acute distress.  Alert and oriented.  Affect is appropriate. Back: No CVA tenderness Abdomen: Soft, nontender without organomegaly or mass; no hernia Pelvic exam: External genitalia-normal BUS-normal Vagina-not  examined visually Bimanual-no palpable masses or tenderness in the adnexal regions or vagina Rectovaginal-normal external exam  ASSESSMENT: 1.  Family history of breast cancer and ovarian cancer 2.  Personal assessment is negative for BRCA1 and BRCA2 3.  Normal pelvic ultrasound 4.  Patient remains asymptomatic regarding pelvic pathology 5.  Normal pelvic exam  PLAN: 1.  Return in 1 year for surveillance regarding ovarian cancer 2.  Follow-up sooner if constitutional symptoms develop such as weight gain, weight loss, abdominal pain, change in bowel function, change in bladder function, pelvic pressure, etc. 3.  Continue with follow-up with other subspecialist regarding GI and GU disease  A total of 15 minutes were spent face-to-face with the patient during this encounter and over half of that time dealt with counseling and coordination of care.  Brayton Mars, MD  Note: This dictation was prepared with Dragon dictation along with smaller phrase technology. Any transcriptional errors that result from this process are unintentional.

## 2017-12-25 NOTE — Patient Instructions (Signed)
1.  Pelvic ultrasound is normal. 2.  Physical exam is unremarkable for abnormalities within the pelvis. 3.  Follow-up in 1 year for surveillance regarding ovarian cancer. 4.  Follow-up as needed if constitutional symptoms develop such as weight gain, weight loss, abdominal pain, change in bowel function, change in bladder function, pelvic pressure, etc.

## 2017-12-30 DIAGNOSIS — I5189 Other ill-defined heart diseases: Secondary | ICD-10-CM

## 2017-12-30 HISTORY — DX: Other ill-defined heart diseases: I51.89

## 2017-12-31 DIAGNOSIS — I2510 Atherosclerotic heart disease of native coronary artery without angina pectoris: Secondary | ICD-10-CM | POA: Diagnosis not present

## 2017-12-31 DIAGNOSIS — E782 Mixed hyperlipidemia: Secondary | ICD-10-CM | POA: Diagnosis not present

## 2017-12-31 DIAGNOSIS — I471 Supraventricular tachycardia: Secondary | ICD-10-CM | POA: Diagnosis not present

## 2017-12-31 DIAGNOSIS — I10 Essential (primary) hypertension: Secondary | ICD-10-CM | POA: Diagnosis not present

## 2018-02-12 DIAGNOSIS — Z1231 Encounter for screening mammogram for malignant neoplasm of breast: Secondary | ICD-10-CM | POA: Diagnosis not present

## 2018-03-05 DIAGNOSIS — H2513 Age-related nuclear cataract, bilateral: Secondary | ICD-10-CM | POA: Diagnosis not present

## 2018-03-06 ENCOUNTER — Other Ambulatory Visit: Payer: Self-pay | Admitting: Internal Medicine

## 2018-03-20 DIAGNOSIS — J019 Acute sinusitis, unspecified: Secondary | ICD-10-CM | POA: Diagnosis not present

## 2018-03-20 DIAGNOSIS — J069 Acute upper respiratory infection, unspecified: Secondary | ICD-10-CM | POA: Diagnosis not present

## 2018-03-20 DIAGNOSIS — R05 Cough: Secondary | ICD-10-CM | POA: Diagnosis not present

## 2018-03-24 ENCOUNTER — Other Ambulatory Visit: Payer: Self-pay

## 2018-03-24 MED ORDER — ROSUVASTATIN CALCIUM 20 MG PO TABS: 20.0000 mg | ORAL_TABLET | Freq: Every day | ORAL | 3 refills | Status: DC

## 2018-04-02 DIAGNOSIS — K5909 Other constipation: Secondary | ICD-10-CM | POA: Diagnosis not present

## 2018-04-02 DIAGNOSIS — K449 Diaphragmatic hernia without obstruction or gangrene: Secondary | ICD-10-CM | POA: Diagnosis not present

## 2018-04-02 DIAGNOSIS — K210 Gastro-esophageal reflux disease with esophagitis: Secondary | ICD-10-CM | POA: Diagnosis not present

## 2018-04-02 DIAGNOSIS — D369 Benign neoplasm, unspecified site: Secondary | ICD-10-CM | POA: Diagnosis not present

## 2018-04-16 ENCOUNTER — Encounter: Payer: Self-pay | Admitting: Nurse Practitioner

## 2018-04-16 ENCOUNTER — Ambulatory Visit (INDEPENDENT_AMBULATORY_CARE_PROVIDER_SITE_OTHER): Payer: Medicare Other | Admitting: Nurse Practitioner

## 2018-04-16 VITALS — BP 151/84 | HR 72 | Resp 16 | Ht 65.0 in | Wt 187.0 lb

## 2018-04-16 DIAGNOSIS — F411 Generalized anxiety disorder: Secondary | ICD-10-CM | POA: Diagnosis not present

## 2018-04-16 DIAGNOSIS — E559 Vitamin D deficiency, unspecified: Secondary | ICD-10-CM | POA: Diagnosis not present

## 2018-04-16 DIAGNOSIS — R05 Cough: Secondary | ICD-10-CM

## 2018-04-16 DIAGNOSIS — I10 Essential (primary) hypertension: Secondary | ICD-10-CM | POA: Diagnosis not present

## 2018-04-16 DIAGNOSIS — E782 Mixed hyperlipidemia: Secondary | ICD-10-CM | POA: Diagnosis not present

## 2018-04-16 DIAGNOSIS — Z0001 Encounter for general adult medical examination with abnormal findings: Secondary | ICD-10-CM

## 2018-04-16 DIAGNOSIS — R059 Cough, unspecified: Secondary | ICD-10-CM | POA: Insufficient documentation

## 2018-04-16 MED ORDER — CLONAZEPAM 0.5 MG PO TABS: ORAL_TABLET | ORAL | 3 refills | Status: DC

## 2018-04-16 MED ORDER — BENZONATATE 200 MG PO CAPS: 200.0000 mg | ORAL_CAPSULE | Freq: Two times a day (BID) | ORAL | 1 refills | Status: DC | PRN

## 2018-04-16 NOTE — Progress Notes (Signed)
[facility] [place], [place] [address]  Internal MEDICINE  Office Visit Note  Patient Name: Amanda Duke  [id number]  [id number]  Date of Service: 04/16/2018  Chief Complaint  Patient presents with  . Medical Management of Chronic Issues    4 month follow up  . Cough    pt was seen at urgent care for URI, cant get rid of the cough  . Quality Metric Gaps    peumonia vaccine    The patient is here for routine follow up visit. Recently treated for upper respiratory infection, she believes in the end of November. Completed her z-pack. Still has intermittent cough, however, this continues to improve. She is due to have routine, fasting blood work done. She had negative mammogram 01/2018. Will be due to bone density next year. She sees GI doctor regularly. Most recent colonoscopy did show polyps. She will have repeat colonoscopy next year.       Current Medication: Outpatient Encounter Medications as of 04/16/2018  Medication Sig  . cholecalciferol (VITAMIN D) 1000 units tablet Take 1,000 Units by mouth daily.  . clonazePAM (KLONOPIN) 0.5 MG tablet TAKE 1 TABLET BY MOUTH AT BEDTIME AS NEEDED INSOMNIA/ANXIETY  . diltiazem (CARDIZEM CD) 120 MG 24 hr capsule Take 1 capsule by mouth.  . fluticasone (FLONASE) 50 MCG/ACT nasal spray Place 2 sprays into the nose.  Amanda Duke Ethyl (VASCEPA) 0.5 g CAPS Take by mouth.  . linaclotide (LINZESS) 145 MCG CAPS capsule Take 1 capsule by mouth.  Marland Kitchen LINZESS 145 MCG CAPS capsule TAKE 1 CAPSULE BY MOUTH EVERY DAY AS NEEDED  . losartan (COZAAR) 25 MG tablet Take 25 mg by mouth daily.  . pantoprazole (PROTONIX) 40 MG tablet Take 40 mg by mouth daily.  . pantoprazole (PROTONIX) 40 MG tablet Take 1 tablet by mouth daily.  . rosuvastatin (CRESTOR) 20 MG tablet Take 1 tablet (20 mg total) by mouth daily.  . [DISCONTINUED] clonazePAM (KLONOPIN) 0.5 MG tablet TAKE 1 TABLET BY MOUTH AT BEDTIME AS NEEDED INSOMNIA/ANXIETY  .  benzonatate (TESSALON) 200 MG capsule Take 1 capsule (200 mg total) by mouth 2 (two) times daily as needed for cough.  . diltiazem (CARDIZEM CD) 120 MG 24 hr capsule Take 120 mg by mouth daily.    No facility-administered encounter medications on file as of 04/16/2018.     Surgical History: Past Surgical History:  Procedure Laterality Date  . APPENDECTOMY  1971  . ARTHRODESIS  1980  . BACK SURGERY  1980  . COLONOSCOPY WITH PROPOFOL N/A 12/12/2016   Procedure: COLONOSCOPY WITH PROPOFOL;  Surgeon: Lollie Sails, MD;  Location: [facility] ENDOSCOPY;  Service: Endoscopy;  Laterality: N/A;  . ESOPHAGOGASTRODUODENOSCOPY (EGD) WITH PROPOFOL N/A 12/12/2016   Procedure: ESOPHAGOGASTRODUODENOSCOPY (EGD) WITH PROPOFOL;  Surgeon: Lollie Sails, MD;  Location: [facility] ENDOSCOPY;  Service: Endoscopy;  Laterality: N/A;  . [place]   right  . TONSILLECTOMY  1948  . TUBAL LIGATION  1971  . VAGINAL HYSTERECTOMY  1985   d/t heavy bleeding    Medical History: Past Medical History:  Diagnosis Date  . Anxiety   . Coronary artery disease   . GERD (gastroesophageal reflux disease)   . Hyperlipemia   . Hypertension   . Irregular heart beat   . Pre-diabetes   . Prediabetes   . Renal cyst    bilateral    Family History: Family History  Problem Relation Age of Onset  . Heart failure  Mother   . Stroke Mother   . Breast cancer Mother        dx at age 81 yo  . Diabetes Mother   . Osteoporosis Mother   . Emphysema Father   . Ovarian cancer Sister        dx at age 37 yo  . Asthma Sister     Social History   Socioeconomic History  . Marital status: Widowed    Spouse name: Not on file  . Number of children: Not on file  . Years of education: Not on file  . Highest education level: Not on file  Occupational History  . Not on file  Social Needs  . Financial resource strain: Not on file  . Food insecurity:    Worry: Not on file    Inability: Not  on file  . Transportation needs:    Medical: Not on file    Non-medical: Not on file  Tobacco Use  . Smoking status: Former Research scientist (life sciences)  . Smokeless tobacco: Never Used  Substance and Sexual Activity  . Alcohol use: No    Alcohol/week: 0.0 standard drinks  . Drug use: Not Currently  . Sexual activity: Not Currently  Lifestyle  . Physical activity:    Days per week: Not on file    Minutes per session: Not on file  . Stress: Not on file  Relationships  . Social connections:    Talks on phone: Not on file    Gets together: Not on file    Attends religious service: Not on file    Active member of club or organization: Not on file    Attends meetings of clubs or organizations: Not on file    Relationship status: Not on file  . Intimate partner violence:    Fear of current or ex partner: Not on file    Emotionally abused: Not on file    Physically abused: Not on file    Forced sexual activity: Not on file  Other Topics Concern  . Not on file  Social History Narrative  . Not on file      Review of Systems  Constitutional: Negative for chills, fatigue and unexpected weight change.  HENT: Negative for congestion, postnasal drip, rhinorrhea, sneezing and sore throat.   Eyes: Negative.  Negative for redness.  Respiratory: Positive for cough. Negative for chest tightness, shortness of breath and wheezing.   Cardiovascular: Negative for chest pain and palpitations.  Gastrointestinal: Negative for abdominal pain, constipation, diarrhea, nausea and vomiting.  Endocrine: Negative for cold intolerance, heat intolerance, polydipsia, polyphagia and polyuria.  Genitourinary: Negative for dysuria, flank pain, frequency and urgency.  Musculoskeletal: Negative for arthralgias, back pain, joint swelling and neck pain.  Skin: Negative for rash.  Allergic/Immunologic: Negative for environmental allergies.  Neurological: Positive for headaches. Negative for tremors and numbness.  Hematological:  Negative for adenopathy. Does not bruise/bleed easily.  Psychiatric/Behavioral: Negative for behavioral problems (Depression), sleep disturbance and suicidal ideas. The patient is nervous/anxious.     Today's Vitals   04/16/18 0843  BP: (!) 151/84  Pulse: 72  Resp: 16  SpO2: 97%  Weight: 187 lb (84.8 kg)  Height: 5\' 5"  (1.651 m)    Physical Exam Constitutional:      General: She is not in acute distress.    Appearance: Normal appearance. She is well-developed. She is not diaphoretic.  HENT:     Head: Normocephalic and atraumatic.     Nose: Nose normal.  Mouth/Throat:     Mouth: Mucous membranes are moist.     Pharynx: Oropharynx is clear. No oropharyngeal exudate.  Eyes:     Extraocular Movements: Extraocular movements intact.     Pupils: Pupils are equal, round, and reactive to light.  Neck:     Musculoskeletal: Normal range of motion and neck supple.     Thyroid: No thyromegaly.     Vascular: No carotid bruit or JVD.     Trachea: No tracheal deviation.  Cardiovascular:     Rate and Rhythm: Normal rate and regular rhythm.     Heart sounds: Normal heart sounds. No murmur. No friction rub. No gallop.   Pulmonary:     Effort: Pulmonary effort is normal. No respiratory distress.     Breath sounds: Normal breath sounds. No wheezing or rales.  Chest:     Chest wall: No tenderness.  Abdominal:     General: Bowel sounds are normal.     Palpations: Abdomen is soft.     Tenderness: There is no abdominal tenderness.  Musculoskeletal: Normal range of motion.  Lymphadenopathy:     Cervical: No cervical adenopathy.  Skin:    General: Skin is warm and dry.  Neurological:     General: No focal deficit present.     Mental Status: She is alert and oriented to person, place, and time.     Cranial Nerves: No cranial nerve deficit.  Psychiatric:        Mood and Affect: Mood normal.        Behavior: Behavior normal.        Thought Content: Thought content normal.         Judgment: Judgment normal.   Assessment/Plan: 1. Benign essential HTN bp stable. Continue bp medication as prescribed  - CBC with Differential/Platelet - Comprehensive metabolic panel - T4, free - TSH  2. Cough Lungs clear to auscultation. Add tessalon 200mg  capsules which can be taken up to three times daily as needed for cough - benzonatate (TESSALON) 200 MG capsule; Take 1 capsule (200 mg total) by mouth 2 (two) times daily as needed for cough.  Dispense: 30 capsule; Refill: 1  3. Mixed hyperlipidemia Check fasting lipid panel and adjust statin as indicated.  - Lipid panel  4. Generalized anxiety disorder May continue clonazepam 0.5mg  at bedtime as needed for anxiety/insmnia. New prescription sent to her pharmacy.  - clonazePAM (KLONOPIN) 0.5 MG tablet; TAKE 1 TABLET BY MOUTH AT BEDTIME AS NEEDED INSOMNIA/ANXIETY  Dispense: 30 tablet; Refill: 3  5. Vitamin D deficiency - Vitamin D 1,25 dihydroxy   General Counseling: Markasia verbalizes understanding of the findings of todays visit and agrees with plan of treatment. I have discussed any further diagnostic evaluation that may be needed or ordered today. We also reviewed her medications today. she has been encouraged to call the office with any questions or concerns that should arise related to todays visit.  Hypertension Counseling:   The following hypertensive lifestyle modification were recommended and discussed:  1. Limiting alcohol intake to less than 1 oz/day of ethanol:(24 oz of beer or 8 oz of wine or 2 oz of 100-proof whiskey). 2. Take baby ASA 81 mg daily. 3. Importance of regular aerobic exercise and losing weight. 4. Reduce dietary saturated fat and cholesterol intake for overall cardiovascular health. 5. Maintaining adequate dietary potassium, calcium, and magnesium intake. 6. Regular monitoring of the blood pressure. 7. Reduce sodium intake to less than 100 mmol/day (less than 2.3 gm of  sodium or less than 6 gm of  sodium choride)   This patient was seen by [place] with Dr Lavera Guise as a part of collaborative care agreement  Orders Placed This Encounter  Procedures  . CBC with Differential/Platelet  . Comprehensive metabolic panel  . T4, free  . TSH  . Lipid panel  . Vitamin D 1,25 dihydroxy    Meds ordered this encounter  Medications  . benzonatate (TESSALON) 200 MG capsule    Sig: Take 1 capsule (200 mg total) by mouth 2 (two) times daily as needed for cough.    Dispense:  30 capsule    Refill:  1    Order Specific Question:   Supervising Provider    Answer:   Lavera Guise [[id number]]  . clonazePAM (KLONOPIN) 0.5 MG tablet    Sig: TAKE 1 TABLET BY MOUTH AT BEDTIME AS NEEDED INSOMNIA/ANXIETY    Dispense:  30 tablet    Refill:  3    Not to exceed 2 additional fills before 06/03/2017    Order Specific Question:   Supervising Provider    Answer:   Lavera Guise [[id number]]    Time spent: 25 Minutes      Dr Lavera Guise Internal medicine

## 2018-05-01 ENCOUNTER — Other Ambulatory Visit
Admission: RE | Admit: 2018-05-01 | Discharge: 2018-05-01 | Disposition: A | Discharge status: Home / Self Care | Payer: Medicare Other | Source: Ambulatory Visit | Attending: Nurse Practitioner | Admitting: Nurse Practitioner

## 2018-05-01 DIAGNOSIS — I10 Essential (primary) hypertension: Secondary | ICD-10-CM | POA: Diagnosis not present

## 2018-05-01 DIAGNOSIS — E559 Vitamin D deficiency, unspecified: Secondary | ICD-10-CM | POA: Insufficient documentation

## 2018-05-01 DIAGNOSIS — E7800 Pure hypercholesterolemia, unspecified: Secondary | ICD-10-CM | POA: Insufficient documentation

## 2018-05-01 LAB — COMPREHENSIVE METABOLIC PANEL
ALT: 15 U/L (ref 0–44)
ANION GAP: 8 (ref 5–15)
AST: 22 U/L (ref 15–41)
Albumin: 4.1 g/dL (ref 3.5–5.0)
Alkaline Phosphatase: 81 U/L (ref 38–126)
BUN: 14 mg/dL (ref 8–23)
CO2: 25 mmol/L (ref 22–32)
Calcium: 9 mg/dL (ref 8.9–10.3)
Chloride: 105 mmol/L (ref 98–111)
Creatinine, Ser: 1.05 mg/dL — ABNORMAL HIGH (ref 0.44–1.00)
GFR calc Af Amer: 59 mL/min — ABNORMAL LOW (ref >60)
GFR calc non Af Amer: 51 mL/min — ABNORMAL LOW (ref >60)
Glucose, Bld: 103 mg/dL — ABNORMAL HIGH (ref 70–99)
POTASSIUM: 3.9 mmol/L (ref 3.5–5.1)
Sodium: 138 mmol/L (ref 135–145)
Total Bilirubin: 0.8 mg/dL (ref 0.3–1.2)
Total Protein: 7.5 g/dL (ref 6.5–8.1)

## 2018-05-01 LAB — TSH: TSH: 1.76 u[IU]/mL (ref 0.350–4.500)

## 2018-05-01 LAB — CBC
HCT: 43.7 % (ref 36.0–46.0)
Hemoglobin: 14.2 g/dL (ref 12.0–15.0)
MCH: 29.5 pg (ref 26.0–34.0)
MCHC: 32.5 g/dL (ref 30.0–36.0)
MCV: 90.7 fL (ref 80.0–100.0)
Platelets: 263 10*3/uL (ref 150–400)
RBC: 4.82 MIL/uL (ref 3.87–5.11)
RDW: 13.8 % (ref 11.5–15.5)
WBC: 6.7 10*3/uL (ref 4.0–10.5)
nRBC: 0 % (ref 0.0–0.2)

## 2018-05-01 LAB — LIPID PANEL
CHOL/HDL RATIO: 5 ratio
Cholesterol: 239 mg/dL — ABNORMAL HIGH (ref 0–200)
HDL: 48 mg/dL (ref >40)
LDL Cholesterol: 153 mg/dL — ABNORMAL HIGH (ref 0–99)
TRIGLYCERIDES: 192 mg/dL — AB (ref <150)
VLDL: 38 mg/dL (ref 0–40)

## 2018-05-01 LAB — T4, FREE: Free T4: 0.86 ng/dL (ref 0.82–1.77)

## 2018-05-02 LAB — VITAMIN D 25 HYDROXY (VIT D DEFICIENCY, FRACTURES): Vit D, 25-Hydroxy: 14.3 ng/mL — ABNORMAL LOW (ref 30.0–100.0)

## 2018-05-07 DIAGNOSIS — I471 Supraventricular tachycardia: Secondary | ICD-10-CM | POA: Diagnosis not present

## 2018-05-07 DIAGNOSIS — E782 Mixed hyperlipidemia: Secondary | ICD-10-CM | POA: Diagnosis not present

## 2018-05-07 DIAGNOSIS — I2510 Atherosclerotic heart disease of native coronary artery without angina pectoris: Secondary | ICD-10-CM | POA: Diagnosis not present

## 2018-05-07 DIAGNOSIS — I10 Essential (primary) hypertension: Secondary | ICD-10-CM | POA: Diagnosis not present

## 2018-06-08 ENCOUNTER — Other Ambulatory Visit: Payer: Self-pay

## 2018-06-08 DIAGNOSIS — I10 Essential (primary) hypertension: Secondary | ICD-10-CM | POA: Diagnosis not present

## 2018-06-08 DIAGNOSIS — I129 Hypertensive chronic kidney disease with stage 1 through stage 4 chronic kidney disease, or unspecified chronic kidney disease: Secondary | ICD-10-CM | POA: Diagnosis not present

## 2018-06-08 DIAGNOSIS — I2510 Atherosclerotic heart disease of native coronary artery without angina pectoris: Secondary | ICD-10-CM | POA: Diagnosis not present

## 2018-06-08 DIAGNOSIS — N390 Urinary tract infection, site not specified: Secondary | ICD-10-CM | POA: Diagnosis not present

## 2018-06-08 DIAGNOSIS — R0789 Other chest pain: Secondary | ICD-10-CM | POA: Diagnosis not present

## 2018-06-08 DIAGNOSIS — N182 Chronic kidney disease, stage 2 (mild): Secondary | ICD-10-CM | POA: Insufficient documentation

## 2018-06-08 DIAGNOSIS — Z87891 Personal history of nicotine dependence: Secondary | ICD-10-CM | POA: Diagnosis not present

## 2018-06-08 DIAGNOSIS — R079 Chest pain, unspecified: Secondary | ICD-10-CM | POA: Diagnosis present

## 2018-06-08 DIAGNOSIS — Z79899 Other long term (current) drug therapy: Secondary | ICD-10-CM | POA: Diagnosis not present

## 2018-06-08 LAB — CBC
HCT: 44.4 % (ref 36.0–46.0)
Hemoglobin: 14.4 g/dL (ref 12.0–15.0)
MCH: 28.8 pg (ref 26.0–34.0)
MCHC: 32.4 g/dL (ref 30.0–36.0)
MCV: 88.8 fL (ref 80.0–100.0)
Platelets: 313 10*3/uL (ref 150–400)
RBC: 5 MIL/uL (ref 3.87–5.11)
RDW: 13.8 % (ref 11.5–15.5)
WBC: 9.2 10*3/uL (ref 4.0–10.5)
nRBC: 0 % (ref 0.0–0.2)

## 2018-06-08 NOTE — ED Triage Notes (Signed)
Pt has been arguing with family and developed chest pain. States feels anxious, no hx of heart disease.

## 2018-06-09 ENCOUNTER — Emergency Department
Admission: EM | Admit: 2018-06-09 | Discharge: 2018-06-09 | Disposition: A | Discharge status: Home / Self Care | Payer: Medicare Other | Attending: Emergency Medicine | Admitting: Emergency Medicine

## 2018-06-09 DIAGNOSIS — R0789 Other chest pain: Secondary | ICD-10-CM | POA: Diagnosis not present

## 2018-06-09 DIAGNOSIS — N390 Urinary tract infection, site not specified: Secondary | ICD-10-CM

## 2018-06-09 DIAGNOSIS — I10 Essential (primary) hypertension: Secondary | ICD-10-CM

## 2018-06-09 LAB — URINALYSIS, ROUTINE W REFLEX MICROSCOPIC
Bacteria, UA: NONE SEEN
Bilirubin Urine: NEGATIVE
Glucose, UA: NEGATIVE mg/dL
Hgb urine dipstick: NEGATIVE
Ketones, ur: NEGATIVE mg/dL
Nitrite: NEGATIVE
PROTEIN: NEGATIVE mg/dL
Specific Gravity, Urine: 1.004 — ABNORMAL LOW (ref 1.005–1.030)
Squamous Epithelial / HPF: NONE SEEN (ref 0–5)
pH: 7 (ref 5.0–8.0)

## 2018-06-09 LAB — COMPREHENSIVE METABOLIC PANEL
ALT: 17 U/L (ref 0–44)
AST: 25 U/L (ref 15–41)
Albumin: 4.6 g/dL (ref 3.5–5.0)
Alkaline Phosphatase: 91 U/L (ref 38–126)
Anion gap: 9 (ref 5–15)
BUN: 16 mg/dL (ref 8–23)
CALCIUM: 9.1 mg/dL (ref 8.9–10.3)
CO2: 26 mmol/L (ref 22–32)
CREATININE: 1.09 mg/dL — AB (ref 0.44–1.00)
Chloride: 103 mmol/L (ref 98–111)
GFR calc Af Amer: 57 mL/min — ABNORMAL LOW (ref >60)
GFR calc non Af Amer: 49 mL/min — ABNORMAL LOW (ref >60)
Glucose, Bld: 140 mg/dL — ABNORMAL HIGH (ref 70–99)
Potassium: 3.7 mmol/L (ref 3.5–5.1)
Sodium: 138 mmol/L (ref 135–145)
Total Bilirubin: 0.5 mg/dL (ref 0.3–1.2)
Total Protein: 8.3 g/dL — ABNORMAL HIGH (ref 6.5–8.1)

## 2018-06-09 LAB — TROPONIN I
Troponin I: <0.03 ng/mL (ref <0.03)
Troponin I: <0.03 ng/mL (ref <0.03)

## 2018-06-09 MED ORDER — FOSFOMYCIN TROMETHAMINE 3 G PO PACK
3.0000 g | PACK | Freq: Once | ORAL | Status: AC
  Administered 2018-06-09: 3 g via ORAL
  Filled 2018-06-09: qty 3

## 2018-06-09 MED ORDER — DILTIAZEM HCL ER COATED BEADS 120 MG PO CP24
120.0000 mg | ORAL_CAPSULE | Freq: Once | ORAL | Status: AC
  Administered 2018-06-09: 120 mg via ORAL
  Filled 2018-06-09: qty 1

## 2018-06-09 MED ORDER — LOSARTAN POTASSIUM 50 MG PO TABS
50.0000 mg | ORAL_TABLET | ORAL | Status: AC
  Administered 2018-06-09: 50 mg via ORAL
  Filled 2018-06-09: qty 1

## 2018-06-09 NOTE — ED Provider Notes (Signed)
[facility] Emergency Department Provider Note  ____________________________________________   First MD Initiated Contact with Patient 06/09/18 0151     (approximate)  I have reviewed the triage vital signs and the nursing notes.   HISTORY  Chief Complaint Chest Pain    HPI Amanda Duke is a 78 y.o. female with medical history as listed below who presents by private vehicle for evaluation of chest pain and anxiety.  She reports that she has been having trouble for the last few hours at home with her granddaughter with a lot of yelling and contentious interactions.  This is increased her anxiety which she suffers from baseline and she started having some chest pains and palpitations.  She has had similar symptoms in the past but has no history of heart attack.  She was not able to take her blood pressure medicine today including diltiazem and losartan and she thinks that this may have contributed as well.  She reports the symptoms were severe and persisted for a couple of hours while she was at home and Undergoing the contentious interactions.  When she came to the emergency department she feels better physically but she still feels upset emotionally.  She no longer is having sharp chest pains and feels just a mild pressure.  She denies shortness of breath, nausea, vomiting, abdominal pain, and dysuria.  She says that she has another granddaughter that can pick her up and with whom she can stay tonight to avoid the contentious situation.  Past Medical History:  Diagnosis Date  . Anxiety   . Coronary artery disease   . GERD (gastroesophageal reflux disease)   . Hyperlipemia   . Hypertension   . Irregular heart beat   . Pre-diabetes   . Prediabetes   . Renal cyst    bilateral    Patient Active Problem List   Diagnosis Date Noted  . Cough 04/16/2018  . Encounter for general adult medical examination with abnormal findings 12/24/2017  . Generalized  anxiety disorder 12/24/2017  . Need for shingles vaccine 12/24/2017  . Urinary tract infection without hematuria 09/12/2017  . Acute sinusitis, unspecified 04/24/2017  . Noninfective gastroenteritis and colitis, unspecified 04/24/2017  . Solitary pulmonary nodule 04/24/2017  . Acute pharyngitis, unspecified 04/24/2017  . Chronic kidney disease, 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/2017  . Palpitations 04/24/2017  . Primary generalized (osteo)arthritis 04/24/2017  . Vitamin D deficiency 04/24/2017  . Impaired fasting glucose 04/24/2017  . Insomnia, unspecified 04/24/2017  . Age-related osteoporosis without current pathological fracture 04/24/2017  . 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 Paroxysmal supraventricular tachycardia ([place]) 05/23/2015  . Family history of breast cancer in first degree relative 02/02/2015  . Family history of ovarian cancer 02/02/2015  . Anxiety 02/02/2015  . Osteoporosis 02/02/2015  . Benign essential HTN 12/16/2014  . Mixed hyperlipidemia 05/19/2014  . Acid reflux 05/19/2014  . Arteriosclerosis of coronary artery 05/19/2014  . Acute chest pain 05/19/2014  . Gastroesophageal reflux disease 05/19/2014  . Coronary artery disease 05/19/2014  . 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 Urge incontinence 11/20/2012  . Renal colic 38/46/6599  . Symptoms involving urinary system 11/20/2012  . Incomplete bladder emptying 11/20/2012  . Frank hematuria 11/20/2012  . Bladder infection, chronic 11/20/2012    Past Surgical History:  Procedure Laterality Date  . APPENDECTOMY  1971  . ARTHRODESIS  1980  . BACK SURGERY  1980  . COLONOSCOPY  WITH PROPOFOL N/A 12/12/2016   Procedure: COLONOSCOPY WITH PROPOFOL;  Surgeon: Lollie Sails, MD;  Location: [facility] ENDOSCOPY;  Service: Endoscopy;  Laterality: N/A;  . ESOPHAGOGASTRODUODENOSCOPY (EGD) WITH PROPOFOL N/A 12/12/2016   Procedure:  ESOPHAGOGASTRODUODENOSCOPY (EGD) WITH PROPOFOL;  Surgeon: Lollie Sails, MD;  Location: [facility] ENDOSCOPY;  Service: Endoscopy;  Laterality: N/A;  . [place]   right  . TONSILLECTOMY  1948  . TUBAL LIGATION  1971  . VAGINAL HYSTERECTOMY  1985   d/t heavy bleeding    Prior to Admission medications   Medication Sig Start Date End Date Taking? Authorizing Provider  benzonatate (TESSALON) 200 MG capsule Take 1 capsule (200 mg total) by mouth 2 (two) times daily as needed for cough. 04/16/18   Ronnell Freshwater, NP  cholecalciferol (VITAMIN D) 1000 units tablet Take 1,000 Units by mouth daily.    [provider]  clonazePAM (KLONOPIN) 0.5 MG tablet TAKE 1 TABLET BY MOUTH AT BEDTIME AS NEEDED INSOMNIA/ANXIETY Patient taking differently: Take 0.5 mg by mouth at bedtime as needed for anxiety (insomnia).  04/16/18   Ronnell Freshwater, NP  diltiazem (CARDIZEM CD) 120 MG 24 hr capsule Take 120 mg by mouth daily.  03/11/18 03/11/19  [provider]  fluticasone (FLONASE) 50 MCG/ACT nasal spray Place 2 sprays into the nose. 03/20/18   [provider]  Icosapent Ethyl (VASCEPA) 1 g CAPS Take 1 g by mouth 2 (two) times daily.     [provider]  LINZESS 145 MCG CAPS capsule TAKE 1 CAPSULE BY MOUTH EVERY DAY AS NEEDED Patient taking differently: Take 145 mcg by mouth daily before breakfast.  03/06/18   Ronnell Freshwater, NP  losartan (COZAAR) 50 MG tablet Take 50 mg by mouth daily.     [provider]  pantoprazole (PROTONIX) 40 MG tablet Take 40 mg by mouth daily.  03/09/18   [provider]  rosuvastatin (CRESTOR) 20 MG tablet Take 1 tablet (20 mg total) by mouth daily. 03/24/18   Ronnell Freshwater, NP    Allergies Amoxicillin; Clindamycin; Gemfibrozil; Levofloxacin; Levofloxacin in d5w; Metoprolol; Simvastatin; Sulfa antibiotics; and Nystatin-triamcinolone  Family History  Problem Relation Age of Onset    . Heart failure Mother   . Stroke Mother   . Breast cancer Mother        dx at age 36 yo  . Diabetes Mother   . Osteoporosis Mother   . Emphysema Father   . Ovarian cancer Sister        dx at age 16 yo  . Asthma Sister     Social History Social History   Tobacco Use  . Smoking status: Former Research scientist (life sciences)  . Smokeless tobacco: Never Used  Substance Use Topics  . Alcohol use: No    Alcohol/week: 0.0 standard drinks  . Drug use: Not Currently    Review of Systems Constitutional: No fever/chills Eyes: No visual changes. ENT: No sore throat. Cardiovascular: Chest pain and pressure as described above. Respiratory: Denies shortness of breath. Gastrointestinal: No abdominal pain.  No nausea, no vomiting.  No diarrhea.  No constipation. Genitourinary: Negative for dysuria. Musculoskeletal: Negative for neck pain.  Negative for back pain. Integumentary: Negative for rash. Neurological: Negative for headaches, focal weakness or numbness. Psychiatric:Anxiety and upset.  She has a safe place to stay. ____________________________________________   PHYSICAL EXAM:  VITAL SIGNS: ED Triage Vitals  Enc Vitals Group     BP 06/08/18 2333 (!) 176/115  Pulse Rate 06/08/18 2333 72     Resp 06/08/18 2333 18     Temp 06/08/18 2333 98.6 F (37 C)     Temp Source 06/08/18 2333 Oral     SpO2 06/08/18 2333 95 %     Weight 06/08/18 2332 83.5 kg (184 lb)     Height 06/08/18 2332 1.626 m (5\' 4" )     Head Circumference --      Peak Flow --      Pain Score 06/08/18 2332 4     Pain Loc --      Pain Edu? --      Excl. in [place]? --     Constitutional: Alert and oriented.  Interacting appropriately with me and even making little jokes but is obviously upset. Eyes: Conjunctivae are normal.  Head: Atraumatic. Nose: No congestion/rhinnorhea. Mouth/Throat: Mucous membranes are moist. Neck: No stridor.  No meningeal signs.   Cardiovascular: Mild tachycardia, regular rhythm. Good peripheral  circulation. Grossly normal heart sounds. Respiratory: Normal respiratory effort.  No retractions. Lungs CTAB. Gastrointestinal: Soft and nontender. No distention.  Musculoskeletal: No lower extremity tenderness nor edema. No gross deformities of extremities. Neurologic:  Normal speech and language. No gross focal neurologic deficits are appreciated.  Skin:  Skin is warm, dry and intact. No rash noted. Psychiatric: Mood and affect are a little bit anxious and upset but generally appropriate.  No suicidal ideation.  She has a safe place to stay and family with whom she can stay.  ____________________________________________   LABS (all labs ordered are listed, but only abnormal results are displayed)  Labs Reviewed  COMPREHENSIVE METABOLIC PANEL - Abnormal; Notable for the following components:      Result Value   Glucose, Bld 140 (*)    Creatinine, Ser 1.09 (*)    Total Protein 8.3 (*)    GFR calc non Af Amer 49 (*)    GFR calc Af Amer 57 (*)    All other components within normal limits  URINALYSIS, ROUTINE W REFLEX MICROSCOPIC - Abnormal; Notable for the following components:   Color, Urine COLORLESS (*)    APPearance CLEAR (*)    Specific Gravity, Urine 1.004 (*)    Leukocytes, UA SMALL (*)    All other components within normal limits  URINE CULTURE  CBC  TROPONIN I  TROPONIN I   ____________________________________________  EKG  ED ECG REPORT I, Hinda Kehr, the attending physician, personally viewed and interpreted this ECG.  Date: 06/08/2018 EKG Time: 23: 32 Rate: 117 Rhythm: Sinus tachycardia QRS Axis: normal Intervals: normal ST/T Wave abnormalities: Non-specific ST segment / T-wave changes, but no clear evidence of acute ischemia. Narrative Interpretation: no definitive evidence of acute ischemia; does not meet STEMI criteria.   ____________________________________________  RADIOLOGY   ED MD interpretation: No indication for imaging  Official  radiology report(s): No results found.  ____________________________________________   PROCEDURES  Critical Care performed: No   Procedure(s) performed:   Procedures   ____________________________________________   INITIAL IMPRESSION / ASSESSMENT AND PLAN / ED COURSE  As part of my medical decision making, I reviewed the following data within the [place] notes reviewed and incorporated, Labs reviewed , EKG interpreted , Old chart reviewed and Notes from prior ED visits    Differential diagnosis includes, but is not limited to, anxiety/panic attack, anginal chest pain, less likely pneumonia or PE.  The patient has a history of anxiety and is obviously anxious and upset about the situation  at home.  Fortunately she has another granddaughter with whom she can go home tonight and she is comfortable with this plan.  Her lab work was all reassuring with a normal comprehensive metabolic panel, 2 negative troponins, normal CBC, and possible UTI which is apparently asymptomatic.  I will send a urine culture and give her a one-time dose of fosfomycin to be safe but she does not require an extensive treatment plan with Keflex.  She is comfortable with the plan for discharge and outpatient follow-up.  For her hypertension which is likely the result of both the situation and missing her medications today, I have ordered her Cardizem 120 mg (extended release) and losartan 50 mg by mouth and she will take her medicines again tomorrow.  I gave my usual and customary return precautions.     ____________________________________________  FINAL CLINICAL IMPRESSION(S) / ED DIAGNOSES  Final diagnoses:  Atypical chest pain  Essential hypertension  Urinary tract infection without hematuria, site unspecified     MEDICATIONS GIVEN DURING THIS VISIT:  Medications  diltiazem (CARDIZEM CD) 24 hr capsule 120 mg (has no administration in time range)  losartan (COZAAR)  tablet 50 mg (has no administration in time range)  fosfomycin (MONUROL) packet 3 g (has no administration in time range)     ED Discharge Orders    None       Note:  This document was prepared using Dragon voice recognition software and may include unintentional dictation errors.   Hinda Kehr, MD 06/09/18 [phone]

## 2018-06-09 NOTE — ED Notes (Signed)
EDP in with patient 

## 2018-06-09 NOTE — Discharge Instructions (Addendum)
Your workup in the Emergency Department today was reassuring.  We did not find any specific abnormalities for a mild urinary tract infection for which we gave you a one-time dose of medication called fosfomycin.  You should not require additional treatment.  We recommend you drink plenty of fluids, take your regular medications and/or any new ones prescribed today, and follow up with the doctor(s) listed in these documents as recommended.    Return to the Emergency Department if you develop new or worsening symptoms that concern you.

## 2018-06-09 NOTE — ED Notes (Signed)
Patient states she has not felt good all day and then this evening she got into an argument with her family (daughter and granddaughter) that is currently staying with her. Patient states family was drinking, doing pills and being loud after she had gone to bed and she asked them to be quite. Patient states that is when she started to have chest pains across her chest with dizziness and nausea. So patient decided to come to ED. Patient states having history of hiatal hernia that is really bad.

## 2018-06-09 NOTE — ED Notes (Signed)
Patient complaining of frequent urination and states she get UTI often.

## 2018-06-10 LAB — URINE CULTURE
Culture: NO GROWTH
Special Requests: NORMAL

## 2018-06-11 ENCOUNTER — Ambulatory Visit (INDEPENDENT_AMBULATORY_CARE_PROVIDER_SITE_OTHER): Payer: Medicare Other | Admitting: Nurse Practitioner

## 2018-06-11 ENCOUNTER — Encounter: Payer: Self-pay | Admitting: Nurse Practitioner

## 2018-06-11 VITALS — BP 114/74 | HR 64 | Resp 16 | Ht 65.0 in | Wt 185.2 lb

## 2018-06-11 DIAGNOSIS — I10 Essential (primary) hypertension: Secondary | ICD-10-CM | POA: Diagnosis not present

## 2018-06-11 DIAGNOSIS — N390 Urinary tract infection, site not specified: Secondary | ICD-10-CM

## 2018-06-11 DIAGNOSIS — R079 Chest pain, unspecified: Secondary | ICD-10-CM

## 2018-06-11 NOTE — Progress Notes (Signed)
[facility] [place], [place] [address]  Internal MEDICINE  Office Visit Note  Patient Name: Amanda Duke  [id number]  [id number]  Date of Service: 06/11/2018  Chief Complaint  Patient presents with  . Hospitalization Follow-up    follow up,  . Urinary Tract Infection    pt stated the UTI was getting better  . Anxiety    The patient was seen in Er on 06/09/2018 due to chest pain and elevated blood pressure. Was diagnosed with UTI. Was given oral antibiotics. Culture yielded normal flora infection. She is feeling much better. She feels as though chest pain and elevation of blood pressure was mostly related to anxiety and stressful situation going on at home. Her blood pressure has returned to normal levels. She is no longer having chest pain or shortness of breath. She does see her cardiologist for follow up from this ER visit on 06/17/2018.       Current Medication: Outpatient Encounter Medications as of 06/11/2018  Medication Sig  . benzonatate (TESSALON) 200 MG capsule Take 1 capsule (200 mg total) by mouth 2 (two) times daily as needed for cough.  . cholecalciferol (VITAMIN D) 1000 units tablet Take 1,000 Units by mouth daily.  . clonazePAM (KLONOPIN) 0.5 MG tablet TAKE 1 TABLET BY MOUTH AT BEDTIME AS NEEDED INSOMNIA/ANXIETY (Patient taking differently: Take 0.5 mg by mouth at bedtime as needed for anxiety (insomnia). )  . diltiazem (CARDIZEM CD) 120 MG 24 hr capsule Take 120 mg by mouth daily.   . fluticasone (FLONASE) 50 MCG/ACT nasal spray Place 2 sprays into the nose.  Vanessa Kick Ethyl (VASCEPA) 1 g CAPS Take 1 g by mouth 2 (two) times daily.   Marland Kitchen LINZESS 145 MCG CAPS capsule TAKE 1 CAPSULE BY MOUTH EVERY DAY AS NEEDED (Patient taking differently: Take 145 mcg by mouth daily before breakfast. )  . losartan (COZAAR) 50 MG tablet Take 50 mg by mouth daily.   . pantoprazole (PROTONIX) 40 MG tablet Take 40 mg by mouth daily.   . rosuvastatin (CRESTOR)  20 MG tablet Take 1 tablet (20 mg total) by mouth daily.   No facility-administered encounter medications on file as of 06/11/2018.     Surgical History: Past Surgical History:  Procedure Laterality Date  . APPENDECTOMY  1971  . ARTHRODESIS  1980  . BACK SURGERY  1980  . COLONOSCOPY WITH PROPOFOL N/A 12/12/2016   Procedure: COLONOSCOPY WITH PROPOFOL;  Surgeon: Lollie Sails, MD;  Location: [facility] ENDOSCOPY;  Service: Endoscopy;  Laterality: N/A;  . ESOPHAGOGASTRODUODENOSCOPY (EGD) WITH PROPOFOL N/A 12/12/2016   Procedure: ESOPHAGOGASTRODUODENOSCOPY (EGD) WITH PROPOFOL;  Surgeon: Lollie Sails, MD;  Location: [facility] ENDOSCOPY;  Service: Endoscopy;  Laterality: N/A;  . [place]   right  . TONSILLECTOMY  1948  . TUBAL LIGATION  1971  . VAGINAL HYSTERECTOMY  1985   d/t heavy bleeding    Medical History: Past Medical History:  Diagnosis Date  . Anxiety   . Coronary artery disease   . GERD (gastroesophageal reflux disease)   . Hyperlipemia   . Hypertension   . Irregular heart beat   . Pre-diabetes   . Prediabetes   . Renal cyst    bilateral    Family History: Family History  Problem Relation Age of Onset  . Heart failure Mother   . Stroke Mother   . Breast cancer Mother        dx at age 72 yo  .  Diabetes Mother   . Osteoporosis Mother   . Emphysema Father   . Ovarian cancer Sister        dx at age 76 yo  . Asthma Sister     Social History   Socioeconomic History  . Marital status: Widowed    Spouse name: Not on file  . Number of children: Not on file  . Years of education: Not on file  . Highest education level: Not on file  Occupational History  . Not on file  Social Needs  . Financial resource strain: Not on file  . Food insecurity:    Worry: Not on file    Inability: Not on file  . Transportation needs:    Medical: Not on file    Non-medical: Not on file  Tobacco Use  . Smoking status: Former Research scientist (life sciences)  .  Smokeless tobacco: Never Used  Substance and Sexual Activity  . Alcohol use: No    Alcohol/week: 0.0 standard drinks  . Drug use: Not Currently  . Sexual activity: Not Currently  Lifestyle  . Physical activity:    Days per week: Not on file    Minutes per session: Not on file  . Stress: Not on file  Relationships  . Social connections:    Talks on phone: Not on file    Gets together: Not on file    Attends religious service: Not on file    Active member of club or organization: Not on file    Attends meetings of clubs or organizations: Not on file    Relationship status: Not on file  . Intimate partner violence:    Fear of current or ex partner: Not on file    Emotionally abused: Not on file    Physically abused: Not on file    Forced sexual activity: Not on file  Other Topics Concern  . Not on file  Social History Narrative  . Not on file      Review of Systems  Constitutional: Negative for chills, fatigue and unexpected weight change.  HENT: Negative for congestion, postnasal drip, rhinorrhea, sneezing and sore throat.   Respiratory: Negative for cough, chest tightness, shortness of breath and wheezing.   Cardiovascular: Negative for chest pain and palpitations.  Gastrointestinal: Negative for abdominal pain, constipation, diarrhea, nausea and vomiting.  Endocrine: Negative for cold intolerance, heat intolerance, polydipsia, polyphagia and polyuria.  Genitourinary: Negative for dysuria, flank pain, frequency and urgency.  Musculoskeletal: Negative for arthralgias, back pain, joint swelling and neck pain.  Skin: Negative for rash.  Allergic/Immunologic: Negative for environmental allergies.  Neurological: Positive for headaches. Negative for tremors and numbness.  Hematological: Negative for adenopathy. Does not bruise/bleed easily.  Psychiatric/Behavioral: Negative for behavioral problems (Depression), sleep disturbance and suicidal ideas. The patient is not  nervous/anxious.     Today's Vitals   06/11/18 0913  BP: 114/74  Pulse: 64  Resp: 16  SpO2: 96%  Weight: 185 lb 3.2 oz (84 kg)  Height: 5\' 5"  (1.651 m)   Body mass index is 30.82 kg/m.  Physical Exam Vitals signs and nursing note reviewed.  Constitutional:      General: She is not in acute distress.    Appearance: Normal appearance. She is well-developed. She is not diaphoretic.  HENT:     Head: Normocephalic and atraumatic.     Nose: Nose normal.     Mouth/Throat:     Mouth: Mucous membranes are moist.     Pharynx: Oropharynx is clear. No  oropharyngeal exudate.  Eyes:     Extraocular Movements: Extraocular movements intact.     Pupils: Pupils are equal, round, and reactive to light.  Neck:     Musculoskeletal: Normal range of motion and neck supple.     Thyroid: No thyromegaly.     Vascular: No carotid bruit or JVD.     Trachea: No tracheal deviation.  Cardiovascular:     Rate and Rhythm: Normal rate. Rhythm irregular.     Heart sounds: Normal heart sounds. No murmur. No friction rub. No gallop.   Pulmonary:     Effort: Pulmonary effort is normal. No respiratory distress.     Breath sounds: Normal breath sounds. No wheezing or rales.  Chest:     Chest wall: No tenderness.  Abdominal:     General: Bowel sounds are normal.     Palpations: Abdomen is soft.     Tenderness: There is no abdominal tenderness.  Musculoskeletal: Normal range of motion.  Lymphadenopathy:     Cervical: No cervical adenopathy.  Skin:    General: Skin is warm and dry.  Neurological:     General: No focal deficit present.     Mental Status: She is alert and oriented to person, place, and time.     Cranial Nerves: No cranial nerve deficit.  Psychiatric:        Mood and Affect: Mood normal.        Behavior: Behavior normal.        Thought Content: Thought content normal.        Judgment: Judgment normal.    Assessment/Plan: 1. Chest pain, unspecified type Reviewed progress notes,  labs, and ECG from ER visit. Chest pain has resolved. Patient to see cardiology provider 06/17/2018   2. Benign essential HTN Stable. No changes made to her BP medication in ER. bp good today. Continue to monitor closely.  3. Urinary tract infection without hematuria, site unspecified Resolved. Culture showing infection with normal flora.   General Counseling: Amanda Duke verbalizes understanding of the findings of todays visit and agrees with plan of treatment. I have discussed any further diagnostic evaluation that may be needed or ordered today. We also reviewed her medications today. she has been encouraged to call the office with any questions or concerns that should arise related to todays visit.   Cardiac risk factor modification:  1. Control blood pressure. 2. Exercise as prescribed. 3. Follow low sodium, low fat diet. and low fat and low cholestrol diet. 4. Take ASA 81mg  once a day. 5. Restricted calories diet to lose weight.  This patient was seen by Leretha Pol FNP Collaboration with Dr Lavera Guise as a part of collaborative care agreement  Time spent: 25 Minutes      Dr Lavera Guise Internal medicine

## 2018-06-17 DIAGNOSIS — I2510 Atherosclerotic heart disease of native coronary artery without angina pectoris: Secondary | ICD-10-CM | POA: Diagnosis not present

## 2018-06-17 DIAGNOSIS — I10 Essential (primary) hypertension: Secondary | ICD-10-CM | POA: Diagnosis not present

## 2018-06-17 DIAGNOSIS — I471 Supraventricular tachycardia: Secondary | ICD-10-CM | POA: Diagnosis not present

## 2018-06-22 ENCOUNTER — Other Ambulatory Visit: Payer: Self-pay

## 2018-06-22 MED ORDER — ROSUVASTATIN CALCIUM 20 MG PO TABS: 20.0000 mg | ORAL_TABLET | Freq: Every day | ORAL | 3 refills | Status: DC

## 2018-07-27 DIAGNOSIS — N3020 Other chronic cystitis without hematuria: Secondary | ICD-10-CM | POA: Diagnosis not present

## 2018-07-27 DIAGNOSIS — E785 Hyperlipidemia, unspecified: Secondary | ICD-10-CM | POA: Diagnosis not present

## 2018-07-27 DIAGNOSIS — Z87891 Personal history of nicotine dependence: Secondary | ICD-10-CM | POA: Diagnosis not present

## 2018-07-27 DIAGNOSIS — I10 Essential (primary) hypertension: Secondary | ICD-10-CM | POA: Diagnosis not present

## 2018-07-27 DIAGNOSIS — N281 Cyst of kidney, acquired: Secondary | ICD-10-CM | POA: Diagnosis not present

## 2018-08-20 ENCOUNTER — Ambulatory Visit: Payer: Self-pay | Admitting: Nurse Practitioner

## 2018-08-21 ENCOUNTER — Ambulatory Visit (INDEPENDENT_AMBULATORY_CARE_PROVIDER_SITE_OTHER): Payer: Medicare Other | Admitting: Nurse Practitioner

## 2018-08-21 ENCOUNTER — Emergency Department
Admission: EM | Admit: 2018-08-21 | Discharge: 2018-08-22 | Disposition: A | Discharge status: Home / Self Care | Payer: Medicare Other | Attending: Emergency Medicine | Admitting: Emergency Medicine

## 2018-08-21 ENCOUNTER — Other Ambulatory Visit: Payer: Self-pay

## 2018-08-21 ENCOUNTER — Encounter: Payer: Self-pay | Admitting: Nurse Practitioner

## 2018-08-21 ENCOUNTER — Emergency Department: Payer: Medicare Other

## 2018-08-21 VITALS — BP 152/78 | HR 92 | Temp 99.9°F | Ht 65.0 in | Wt 185.0 lb

## 2018-08-21 DIAGNOSIS — Z79899 Other long term (current) drug therapy: Secondary | ICD-10-CM | POA: Diagnosis not present

## 2018-08-21 DIAGNOSIS — R05 Cough: Secondary | ICD-10-CM | POA: Insufficient documentation

## 2018-08-21 DIAGNOSIS — I10 Essential (primary) hypertension: Secondary | ICD-10-CM

## 2018-08-21 DIAGNOSIS — R945 Abnormal results of liver function studies: Secondary | ICD-10-CM | POA: Insufficient documentation

## 2018-08-21 DIAGNOSIS — I2510 Atherosclerotic heart disease of native coronary artery without angina pectoris: Secondary | ICD-10-CM | POA: Insufficient documentation

## 2018-08-21 DIAGNOSIS — R110 Nausea: Secondary | ICD-10-CM | POA: Insufficient documentation

## 2018-08-21 DIAGNOSIS — N3000 Acute cystitis without hematuria: Secondary | ICD-10-CM | POA: Insufficient documentation

## 2018-08-21 DIAGNOSIS — N3090 Cystitis, unspecified without hematuria: Secondary | ICD-10-CM | POA: Diagnosis not present

## 2018-08-21 DIAGNOSIS — Z87891 Personal history of nicotine dependence: Secondary | ICD-10-CM | POA: Diagnosis not present

## 2018-08-21 DIAGNOSIS — N390 Urinary tract infection, site not specified: Secondary | ICD-10-CM

## 2018-08-21 DIAGNOSIS — K760 Fatty (change of) liver, not elsewhere classified: Secondary | ICD-10-CM | POA: Diagnosis not present

## 2018-08-21 DIAGNOSIS — R509 Fever, unspecified: Secondary | ICD-10-CM | POA: Diagnosis present

## 2018-08-21 DIAGNOSIS — K449 Diaphragmatic hernia without obstruction or gangrene: Secondary | ICD-10-CM | POA: Diagnosis not present

## 2018-08-21 DIAGNOSIS — R7989 Other specified abnormal findings of blood chemistry: Secondary | ICD-10-CM

## 2018-08-21 MED ORDER — SODIUM CHLORIDE 0.9 % IV BOLUS
1000.0000 mL | Freq: Once | INTRAVENOUS | Status: AC
  Administered 2018-08-22: 1000 mL via INTRAVENOUS

## 2018-08-21 MED ORDER — SODIUM CHLORIDE 0.9 % IV SOLN
1.0000 g | INTRAVENOUS | Status: DC
  Administered 2018-08-22: 1 g via INTRAVENOUS
  Filled 2018-08-21: qty 10

## 2018-08-21 MED ORDER — CIPROFLOXACIN HCL 500 MG PO TABS: 500.0000 mg | ORAL_TABLET | Freq: Two times a day (BID) | ORAL | 0 refills | Status: DC

## 2018-08-21 MED ORDER — ONDANSETRON HCL 4 MG PO TABS: 4.0000 mg | ORAL_TABLET | Freq: Three times a day (TID) | ORAL | 0 refills | Status: DC | PRN

## 2018-08-21 NOTE — ED Triage Notes (Signed)
Patient reports currently being treated for UTI and recently changed antibiotics.  Patient reports fever and cough for 3 days.

## 2018-08-21 NOTE — Progress Notes (Signed)
[facility] [place], [place] [address]  Internal MEDICINE  Telephone Visit  Patient Name: Amanda Duke  [id number]  [id number]  Date of Service: 09/09/2018  I connected with the patient at 1:46 by telephone and verified the patients identity using two identifiers.   I discussed the limitations, risks, security and privacy concerns of performing an evaluation and management service by telephone and the availability of in person appointments. I also discussed with the patient that there may be a patient responsible charge related to the service.  The patient expressed understanding and agrees to proceed.    Chief Complaint  Patient presents with  . Telephone Assessment  . Telephone Screen  . Fever    glucose 173 and Bp was high  . 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  The patient was started macrobid 100mg  bid for uti over the past weekend. She continues to have urinary frequency, fever, sweats, and nausea. She states that nausea is so bad, it is decreased her appetite. She has lost weight because of this. She states that most of the time, macrobid has not helped. Most of the time, she has had to take cipro to resolve infections.       Current Medication: Outpatient Encounter Medications as of 08/21/2018  Medication Sig  . benzonatate (TESSALON) 200 MG capsule Take 1 capsule (200 mg total) by mouth 2 (two) times daily as needed for cough.  . cholecalciferol (VITAMIN D) 1000 units tablet Take 1,000 Units by mouth daily.  . ciprofloxacin (CIPRO) 500 MG tablet Take 1 tablet (500 mg total) by mouth 2 (two) times daily.  . clonazePAM (KLONOPIN) 0.5 MG tablet TAKE 1 TABLET BY MOUTH AT BEDTIME AS NEEDED INSOMNIA/ANXIETY (Patient taking differently: Take 0.5 mg by mouth at bedtime as needed for anxiety (insomnia). )  . diltiazem (CARDIZEM CD) 120  MG 24 hr capsule Take 120 mg by mouth daily.   . fluticasone (FLONASE) 50 MCG/ACT nasal spray Place 2 sprays into the nose.  Vanessa Kick Ethyl (VASCEPA) 1 g CAPS Take 1 g by mouth 2 (two) times daily.   Marland Kitchen LINZESS 145 MCG CAPS capsule TAKE 1 CAPSULE BY MOUTH EVERY DAY AS NEEDED (Patient taking differently: Take 145 mcg by mouth daily before breakfast. )  . losartan (COZAAR) 50 MG tablet Take 50 mg by mouth daily.   . ondansetron (ZOFRAN) 4 MG tablet Take 1 tablet (4 mg total) by mouth every 8 (eight) hours as needed for nausea or vomiting.  . pantoprazole (PROTONIX) 40 MG tablet Take 40 mg by mouth daily.   . rosuvastatin (CRESTOR) 20 MG tablet Take 1 tablet (20 mg total) by mouth daily.   No facility-administered encounter medications on file as of 08/21/2018.     Surgical History: Past Surgical History:  Procedure Laterality Date  . APPENDECTOMY  1971  . ARTHRODESIS  1980  . BACK SURGERY  1980  . COLONOSCOPY WITH PROPOFOL N/A 12/12/2016   Procedure: COLONOSCOPY WITH PROPOFOL;  Surgeon: Lollie Sails, MD;  Location: [facility] ENDOSCOPY;  Service: Endoscopy;  Laterality: N/A;  . ESOPHAGOGASTRODUODENOSCOPY (EGD) WITH PROPOFOL N/A 12/12/2016   Procedure: ESOPHAGOGASTRODUODENOSCOPY (EGD) WITH PROPOFOL;  Surgeon: Lollie Sails, MD;  Location: [facility] ENDOSCOPY;  Service: Endoscopy;  Laterality: N/A;  . [place]   right  . TONSILLECTOMY  [place]  . VAGINAL HYSTERECTOMY  1985   d/t heavy bleeding    Medical History: Past Medical History:  Diagnosis Date  . Anxiety   . Coronary artery disease   . GERD (gastroesophageal reflux disease)   . Hyperlipemia   . Hypertension   . Irregular heart beat   . Pre-diabetes   . Prediabetes   . Renal cyst    bilateral    Family History: Family History  Problem Relation Age of Onset  . Heart failure Mother   . Stroke Mother   . Breast cancer Mother        dx at age 35 yo  .  Diabetes Mother   . Osteoporosis Mother   . Emphysema Father   . Ovarian cancer Sister        dx at age 18 yo  . Asthma Sister     Social History   Socioeconomic History  . Marital status: Widowed    Spouse name: Not on file  . Number of children: Not on file  . Years of education: Not on file  . Highest education level: Not on file  Occupational History  . Not on file  Social Needs  . Financial resource strain: Not on file  . Food insecurity:    Worry: Not on file    Inability: Not on file  . Transportation needs:    Medical: Not on file    Non-medical: Not on file  Tobacco Use  . Smoking status: Former Research scientist (life sciences)  . Smokeless tobacco: Never Used  Substance and Sexual Activity  . Alcohol use: No    Alcohol/week: 0.0 standard drinks  . Drug use: Not Currently  . Sexual activity: Not Currently  Lifestyle  . Physical activity:    Days per week: Not on file    Minutes per session: Not on file  . Stress: Not on file  Relationships  . Social connections:    Talks on phone: Not on file    Gets together: Not on file    Attends religious service: Not on file    Active member of club or organization: Not on file    Attends meetings of clubs or organizations: Not on file    Relationship status: Not on file  . Intimate partner violence:    Fear of current or ex partner: Not on file    Emotionally abused: Not on file    Physically abused: Not on file    Forced sexual activity: Not on file  Other Topics Concern  . Not on file  Social History Narrative  . Not on file      Review of Systems  Constitutional: Positive for appetite change, fatigue and fever. Negative for chills and unexpected weight change.  HENT: Negative for congestion, postnasal drip, rhinorrhea, sneezing and sore throat.   Respiratory: Negative for cough, chest tightness and shortness of breath.   Cardiovascular: Negative for chest pain and palpitations.  Gastrointestinal: Positive for nausea and  vomiting. Negative for abdominal pain, constipation and diarrhea.  Genitourinary: Positive for dysuria, flank pain, frequency and urgency.  Musculoskeletal: Positive for back pain. Negative for arthralgias, joint swelling and neck pain.  Skin: Negative for rash.  Neurological: Positive for headaches. Negative for tremors and numbness.  Hematological: Negative for adenopathy. Does not bruise/bleed easily.  Psychiatric/Behavioral: Negative for behavioral problems (Depression), sleep disturbance and suicidal ideas. The patient is not nervous/anxious.     Today's Vitals   08/21/18  1334  BP: (!) 152/78  Pulse: 92  Temp: 99.9 F (37.7 C)  Weight: 185 lb (83.9 kg)  Height: 5\' 5"  (1.651 m)   Body mass index is 30.79 kg/m.  Observation/Objective:    The patient is alert and oriented. She is pleasant and answers all questions appropriately. Breathing is non-labored. She is in no acute distress at this time.    Assessment/Plan: 1. Urinary tract infection without hematuria, site unspecified Change antibiotics to cipro 500mg  bid for 10 days. Adjust antibiotics as indicated for persistent infection.  - ciprofloxacin (CIPRO) 500 MG tablet; Take 1 tablet (500 mg total) by mouth 2 (two) times daily.  Dispense: 20 tablet; Refill: 0  2. Nausea zofran 4mg  may be taken up to three times daily as needed for nausea. BRAT diet recommended. Advance as tolerated  - ondansetron (ZOFRAN) 4 MG tablet; Take 1 tablet (4 mg total) by mouth every 8 (eight) hours as needed for nausea or vomiting.  Dispense: 30 tablet; Refill: 0  3. Benign essential HTN Stable. Continue bp medication as prescribed   General Counseling: Amanda Duke verbalizes understanding of the findings of today's phone visit and agrees with plan of treatment. I have discussed any further diagnostic evaluation that may be needed or ordered today. We also reviewed her medications today. she has been encouraged to call the office with any questions or  concerns that should arise related to todays visit.  This patient was seen by [place] with Dr Lavera Guise as a part of collaborative care agreement  Meds ordered this encounter  Medications  . ciprofloxacin (CIPRO) 500 MG tablet    Sig: Take 1 tablet (500 mg total) by mouth 2 (two) times daily.    Dispense:  20 tablet    Refill:  0    Order Specific Question:   Supervising Provider    Answer:   Lavera Guise [[id number]]  . ondansetron (ZOFRAN) 4 MG tablet    Sig: Take 1 tablet (4 mg total) by mouth every 8 (eight) hours as needed for nausea or vomiting.    Dispense:  30 tablet    Refill:  0    Order Specific Question:   Supervising Provider    Answer:   Lavera Guise [[id number]]    Time spent: 68 Minutes    Dr Lavera Guise Internal medicine

## 2018-08-22 ENCOUNTER — Emergency Department: Payer: Medicare Other

## 2018-08-22 ENCOUNTER — Encounter: Payer: Self-pay | Admitting: Radiology

## 2018-08-22 DIAGNOSIS — K760 Fatty (change of) liver, not elsewhere classified: Secondary | ICD-10-CM | POA: Diagnosis not present

## 2018-08-22 DIAGNOSIS — N3000 Acute cystitis without hematuria: Secondary | ICD-10-CM | POA: Diagnosis not present

## 2018-08-22 DIAGNOSIS — K449 Diaphragmatic hernia without obstruction or gangrene: Secondary | ICD-10-CM | POA: Diagnosis not present

## 2018-08-22 LAB — PROTIME-INR
INR: 1 (ref 0.8–1.2)
Prothrombin Time: 12.9 seconds (ref 11.4–15.2)

## 2018-08-22 LAB — CBC WITH DIFFERENTIAL/PLATELET
Abs Immature Granulocytes: 0.04 10*3/uL (ref 0.00–0.07)
Basophils Absolute: 0.1 10*3/uL (ref 0.0–0.1)
Basophils Relative: 1 %
Eosinophils Absolute: 0.4 10*3/uL (ref 0.0–0.5)
Eosinophils Relative: 4 %
HCT: 39.5 % (ref 36.0–46.0)
Hemoglobin: 13.1 g/dL (ref 12.0–15.0)
Immature Granulocytes: 0 %
Lymphocytes Relative: 9 %
Lymphs Abs: 0.8 10*3/uL (ref 0.7–4.0)
MCH: 28.5 pg (ref 26.0–34.0)
MCHC: 33.2 g/dL (ref 30.0–36.0)
MCV: 85.9 fL (ref 80.0–100.0)
Monocytes Absolute: 1.2 10*3/uL — ABNORMAL HIGH (ref 0.1–1.0)
Monocytes Relative: 13 %
Neutro Abs: 6.6 10*3/uL (ref 1.7–7.7)
Neutrophils Relative %: 73 %
Platelets: 220 10*3/uL (ref 150–400)
RBC: 4.6 MIL/uL (ref 3.87–5.11)
RDW: 14.9 % (ref 11.5–15.5)
WBC: 9.1 10*3/uL (ref 4.0–10.5)
nRBC: 0 % (ref 0.0–0.2)

## 2018-08-22 LAB — COMPREHENSIVE METABOLIC PANEL
ALT: 175 U/L — ABNORMAL HIGH (ref 0–44)
AST: 177 U/L — ABNORMAL HIGH (ref 15–41)
Albumin: 4 g/dL (ref 3.5–5.0)
Alkaline Phosphatase: 227 U/L — ABNORMAL HIGH (ref 38–126)
Anion gap: 14 (ref 5–15)
BUN: 11 mg/dL (ref 8–23)
CO2: 24 mmol/L (ref 22–32)
Calcium: 9.2 mg/dL (ref 8.9–10.3)
Chloride: 96 mmol/L — ABNORMAL LOW (ref 98–111)
Creatinine, Ser: 1.04 mg/dL — ABNORMAL HIGH (ref 0.44–1.00)
GFR calc Af Amer: >60 mL/min (ref >60)
GFR calc non Af Amer: 52 mL/min — ABNORMAL LOW (ref >60)
Glucose, Bld: 145 mg/dL — ABNORMAL HIGH (ref 70–99)
Potassium: 3.3 mmol/L — ABNORMAL LOW (ref 3.5–5.1)
Sodium: 134 mmol/L — ABNORMAL LOW (ref 135–145)
Total Bilirubin: 2 mg/dL — ABNORMAL HIGH (ref 0.3–1.2)
Total Protein: 7.9 g/dL (ref 6.5–8.1)

## 2018-08-22 LAB — URINALYSIS, COMPLETE (UACMP) WITH MICROSCOPIC
Glucose, UA: NEGATIVE mg/dL
Hgb urine dipstick: NEGATIVE
Ketones, ur: NEGATIVE mg/dL
Nitrite: NEGATIVE
Protein, ur: 100 mg/dL — AB
Specific Gravity, Urine: 1.021 (ref 1.005–1.030)
WBC, UA: >50 WBC/hpf — ABNORMAL HIGH (ref 0–5)
pH: 5 (ref 5.0–8.0)

## 2018-08-22 LAB — PROCALCITONIN: Procalcitonin: 0.46 ng/mL

## 2018-08-22 LAB — APTT: aPTT: 38 seconds — ABNORMAL HIGH (ref 24–36)

## 2018-08-22 LAB — LIPASE, BLOOD: Lipase: 40 U/L (ref 11–51)

## 2018-08-22 LAB — LACTIC ACID, PLASMA: Lactic Acid, Venous: 1.6 mmol/L (ref 0.5–1.9)

## 2018-08-22 MED ORDER — CEFDINIR 300 MG PO CAPS: 300.0000 mg | ORAL_CAPSULE | Freq: Two times a day (BID) | ORAL | 0 refills | Status: DC

## 2018-08-22 MED ORDER — IOHEXOL 300 MG/ML  SOLN
100.0000 mL | Freq: Once | INTRAMUSCULAR | Status: AC | PRN
  Administered 2018-08-22: 100 mL via INTRAVENOUS
  Filled 2018-08-22: qty 100

## 2018-08-22 NOTE — ED Provider Notes (Signed)
[facility] Emergency Department Provider Note  ____________________________________________  Time seen: Approximately 2:31 AM  I have reviewed the triage vital signs and the nursing notes.   HISTORY  Chief Complaint Fever and Cough    HPI Amanda Duke is a 78 y.o. female with a history of CAD hypertension prediabetes and renal cysts who comes the ED complaining of fever for the past 3 days along with fatigue chills and sweats.  In triage she mentioned cough, but she denies any significant cough to me.  Denies shortness of breath chest pain or any sick contacts or high risk public activity that might risk exposure in the current pandemic.  She does have persistent urinary frequency despite being treated with Macrobid for the past week.  Her doctor started her on Cipro today.  She has a history of UTI which required Cipro in the past and was resistant to [place].  Denies flank pain.  Symptoms are constant, waxing waning without aggravating or alleviating factors.      Past Medical History:  Diagnosis Date  . Anxiety   . Coronary artery disease   . GERD (gastroesophageal reflux disease)   . Hyperlipemia   . Hypertension   . Irregular heart beat   . Pre-diabetes   . Prediabetes   . Renal cyst    bilateral     Patient Active Problem List   Diagnosis Date Noted  . Nausea 08/21/2018  . Cough 04/16/2018  . Encounter for general adult medical examination with abnormal findings 12/24/2017  . Generalized anxiety disorder 12/24/2017  . Need for shingles vaccine 12/24/2017  . Urinary tract infection without hematuria 09/12/2017  . Acute sinusitis, unspecified 04/24/2017  . Noninfective gastroenteritis and colitis, unspecified 04/24/2017  . Solitary pulmonary nodule 04/24/2017  . Acute pharyngitis, unspecified 04/24/2017  . Chronic kidney disease, 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/2017  . Palpitations 04/24/2017  . Primary generalized (osteo)arthritis 04/24/2017   . Vitamin D deficiency 04/24/2017  . Impaired fasting glucose 04/24/2017  . Insomnia, unspecified 04/24/2017  . Age-related osteoporosis without current pathological fracture 04/24/2017  . 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 Paroxysmal supraventricular tachycardia ([place]) 05/23/2015  . Family history of breast cancer in first degree relative 02/02/2015  . Family history of ovarian cancer 02/02/2015  . Anxiety 02/02/2015  . Osteoporosis 02/02/2015  . Benign essential HTN 12/16/2014  . Mixed hyperlipidemia 05/19/2014  . Acid reflux 05/19/2014  . Arteriosclerosis of coronary artery 05/19/2014  . Chest pain 05/19/2014  . Gastroesophageal reflux disease 05/19/2014  . Coronary artery disease 05/19/2014  . 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 Urge incontinence 11/20/2012  . Renal colic 02/23/2535  . Symptoms involving urinary system 11/20/2012  . Incomplete bladder emptying 11/20/2012  . Frank hematuria 11/20/2012  . Bladder infection, chronic 11/20/2012     Past Surgical History:  Procedure Laterality Date  . APPENDECTOMY  1971  . ARTHRODESIS  1980  . BACK SURGERY  1980  . COLONOSCOPY WITH PROPOFOL N/A 12/12/2016   Procedure: COLONOSCOPY WITH PROPOFOL;  Surgeon: Lollie Sails, MD;  Location: [facility] ENDOSCOPY;  Service: Endoscopy;  Laterality: N/A;  . ESOPHAGOGASTRODUODENOSCOPY (EGD) WITH PROPOFOL N/A 12/12/2016   Procedure: ESOPHAGOGASTRODUODENOSCOPY (EGD) WITH PROPOFOL;  Surgeon: Lollie Sails, MD;  Location: [facility] ENDOSCOPY;  Service: Endoscopy;  Laterality: N/A;  . [place]   right  . TONSILLECTOMY  1948  . TUBAL LIGATION  [place]   d/t heavy bleeding     Prior to Admission medications   Medication Sig Start Date End Date Taking? Authorizing Provider  benzonatate (TESSALON) 200 MG capsule Take 1 capsule  (200 mg total) by mouth 2 (two) times daily as needed for cough. 04/16/18   Ronnell Freshwater, NP  cefdinir (OMNICEF) 300 MG capsule Take 1 capsule (300 mg total) by mouth 2 (two) times daily. 08/22/18   Carrie Mew, MD  cholecalciferol (VITAMIN D) 1000 units tablet Take 1,000 Units by mouth daily.    [provider]  ciprofloxacin (CIPRO) 500 MG tablet Take 1 tablet (500 mg total) by mouth 2 (two) times daily. 08/21/18   Boscia, Greer Ee, NP  clonazePAM (KLONOPIN) 0.5 MG tablet TAKE 1 TABLET BY MOUTH AT BEDTIME AS NEEDED INSOMNIA/ANXIETY Patient taking differently: Take 0.5 mg by mouth at bedtime as needed for anxiety (insomnia).  04/16/18   Ronnell Freshwater, NP  diltiazem (CARDIZEM CD) 120 MG 24 hr capsule Take 120 mg by mouth daily.  03/11/18 03/11/19  [provider]  fluticasone (FLONASE) 50 MCG/ACT nasal spray Place 2 sprays into the nose. 03/20/18   [provider]  Icosapent Ethyl (VASCEPA) 1 g CAPS Take 1 g by mouth 2 (two) times daily.     [provider]  LINZESS 145 MCG CAPS capsule TAKE 1 CAPSULE BY MOUTH EVERY DAY AS NEEDED Patient taking differently: Take 145 mcg by mouth daily before breakfast.  03/06/18   Ronnell Freshwater, NP  losartan (COZAAR) 50 MG tablet Take 50 mg by mouth daily.     [provider]  ondansetron (ZOFRAN) 4 MG tablet Take 1 tablet (4 mg total) by mouth every 8 (eight) hours as needed for nausea or vomiting. 08/21/18   Ronnell Freshwater, NP  pantoprazole (PROTONIX) 40 MG tablet Take 40 mg by mouth daily.  03/09/18   [provider]  rosuvastatin (CRESTOR) 20 MG tablet Take 1 tablet (20 mg total) by mouth daily. 06/22/18   Ronnell Freshwater, NP     Allergies Amoxicillin; Clindamycin; Gemfibrozil; Levofloxacin; Levofloxacin in d5w; Metoprolol; Simvastatin; Sulfa antibiotics; and Nystatin-triamcinolone   Family History  Problem Relation Age of Onset  . Heart failure Mother   . Stroke Mother   . Breast  cancer Mother        dx at age 39 yo  . Diabetes Mother   . Osteoporosis Mother   . Emphysema Father   . Ovarian cancer Sister        dx at age 61 yo  . Asthma Sister     Social History Social History   Tobacco Use  . Smoking status: Former Research scientist (life sciences)  . Smokeless tobacco: Never Used  Substance Use Topics  . Alcohol use: No    Alcohol/week: 0.0 standard drinks  . Drug use: Not Currently    Review of Systems  Constitutional:   Positive fever and chills.  ENT:   No sore throat. No rhinorrhea. Cardiovascular:   No chest pain or syncope. Respiratory:   No dyspnea or cough. Gastrointestinal: Positive for generalized abdominal pain, vomiting and diarrhea.  Musculoskeletal:   Negative for focal pain or swelling All other systems reviewed and are negative except as documented above in ROS and HPI.  ____________________________________________   PHYSICAL EXAM:  VITAL SIGNS: ED Triage Vitals [08/21/18 2308]  Enc Vitals Group     BP (!) 167/76     Pulse Rate Marland Kitchen)  110     Resp 18     Temp (!) 101.1 F (38.4 C)     Temp Source Oral     SpO2 93 %     Weight      Height      Head Circumference      Peak Flow      Pain Score 0     Pain Loc      Pain Edu?      Excl. in [place]?     Vital signs reviewed, nursing assessments reviewed.   Constitutional:   Alert and oriented. Non-toxic appearance. Eyes:   Conjunctivae are normal. EOMI. PERRL. ENT      Head:   Normocephalic and atraumatic.      Nose:   No congestion/rhinnorhea.       Mouth/Throat:   MMM, no pharyngeal erythema. No peritonsillar mass.       Neck:   No meningismus. Full ROM. Hematological/Lymphatic/Immunilogical:   No cervical lymphadenopathy. Cardiovascular:   Tachycardia heart rate 110. Symmetric bilateral radial and DP pulses.  No murmurs. Cap refill less than 2 seconds. Respiratory:   Normal respiratory effort without tachypnea/retractions. Breath sounds are clear and equal bilaterally. No  wheezes/rales/rhonchi. Gastrointestinal:   Soft with minimal generalized tenderness, nonfocal. Non distended. There is no CVA tenderness.  No rebound, rigidity, or guarding.  Musculoskeletal:   Normal range of motion in all extremities. No joint effusions.  No lower extremity tenderness.  No edema. Neurologic:   Normal speech and language.  Motor grossly intact. No acute focal neurologic deficits are appreciated.  Skin:    Skin is warm, dry and intact. No rash noted.  No petechiae, purpura, or bullae.  ____________________________________________    LABS (pertinent positives/negatives) (all labs ordered are listed, but only abnormal results are displayed) Labs Reviewed  COMPREHENSIVE METABOLIC PANEL - Abnormal; Notable for the following components:      Result Value   Sodium 134 (*)    Potassium 3.3 (*)    Chloride 96 (*)    Glucose, Bld 145 (*)    Creatinine, Ser 1.04 (*)    AST 177 (*)    ALT 175 (*)    Alkaline Phosphatase 227 (*)    Total Bilirubin 2.0 (*)    GFR calc non Af Amer 52 (*)    All other components within normal limits  CBC WITH DIFFERENTIAL/PLATELET - Abnormal; Notable for the following components:   Monocytes Absolute 1.2 (*)    All other components within normal limits  APTT - Abnormal; Notable for the following components:   aPTT 38 (*)    All other components within normal limits  URINALYSIS, COMPLETE (UACMP) WITH MICROSCOPIC - Abnormal; Notable for the following components:   Color, Urine AMBER (*)    APPearance CLOUDY (*)    Bilirubin Urine SMALL (*)    Protein, ur 100 (*)    Leukocytes,Ua MODERATE (*)    WBC, UA >50 (*)    Bacteria, UA FEW (*)    Non Squamous Epithelial PRESENT (*)    All other components within normal limits  CULTURE, BLOOD (ROUTINE X 2)  CULTURE, BLOOD (ROUTINE X 2)  URINE CULTURE  LACTIC ACID, PLASMA  LIPASE, BLOOD  PROCALCITONIN  PROTIME-INR  LACTIC ACID, PLASMA    ____________________________________________   EKG    ____________________________________________    RADIOLOGY  Ct Abdomen Pelvis W Contrast  Result Date: 08/22/2018 CLINICAL DATA:  Abnormal LFTs.  Abdominal pain EXAM: CT ABDOMEN AND PELVIS  WITH CONTRAST TECHNIQUE: Multidetector CT imaging of the abdomen and pelvis was performed using the standard protocol following bolus administration of intravenous contrast. CONTRAST:  169mL OMNIPAQUE IOHEXOL 300 MG/ML  SOLN COMPARISON:  09/27/2016 FINDINGS: Lower chest: Moderate-sized hiatal hernia.  No acute abnormality. Hepatobiliary: Diffuse low-density throughout the liver compatible with fatty infiltration. No focal abnormality. Gallbladder unremarkable. Pancreas: No focal abnormality or ductal dilatation. Spleen: No focal abnormality.  Normal size. Adrenals/Urinary Tract: No renal or adrenal mass. No hydronephrosis. Urinary bladder unremarkable. Stomach/Bowel: There is mild wall thickening noted within the ascending colon. The ascending colon and cecum cross the midline into the left lower quadrant. Some areas of wall thickening appeared to be fat density suggesting old/burned-out inflammation, likely related to old inflammatory bowel disease. Appearance is similar to prior study from 2018. Doubt acute process. No evidence of bowel obstruction. Vascular/Lymphatic: Aortic atherosclerosis. No enlarged abdominal or pelvic lymph nodes. Reproductive: Prior hysterectomy.  No adnexal masses. Other: No free fluid or free air. Musculoskeletal: No acute bony abnormality. IMPRESSION: Moderate-sized hiatal hernia. Mild fatty infiltration of the liver. Fat density wall thickening within the cecum and ascending colon, likely related to old inflammation/inflammatory bowel disease. No definite acute process. Aortic atherosclerosis. Electronically Signed   By: Rolm Baptise M.D.   On: 08/22/2018 01:38   Dg Chest Port 1 View  Result Date: 08/21/2018 CLINICAL DATA:   Fever and cough EXAM: PORTABLE CHEST 1 VIEW COMPARISON:  08/23/2016 FINDINGS: Coarse chronic interstitial opacity. No focal consolidation or effusion. Borderline to mild cardiomegaly. Moderate to large hiatal hernia. No pneumothorax. IMPRESSION: No active disease.  Hiatal hernia Electronically Signed   By: Donavan Foil M.D.   On: 08/21/2018 23:53    ____________________________________________   PROCEDURES Procedures  ____________________________________________  DIFFERENTIAL DIAGNOSIS   Cystitis, pyelonephritis, ureteral stone, kidney abscess, biliary disease.  Doubt COVID  CLINICAL IMPRESSION / ASSESSMENT AND PLAN / ED COURSE  Medications ordered in the ED: Medications  cefTRIAXone (ROCEPHIN) 1 g in sodium chloride 0.9 % 100 mL IVPB (0 g Intravenous Stopped 08/22/18 0050)  sodium chloride 0.9 % bolus 1,000 mL (1,000 mLs Intravenous New Bag/Given 08/22/18 0030)  iohexol (OMNIPAQUE) 300 MG/ML solution 100 mL (100 mLs Intravenous Contrast Given 08/22/18 0100)    Pertinent labs & imaging results that were available during my care of the patient were reviewed by me and considered in my medical decision making (see chart for details).  BERNETTA SUTLEY was evaluated in Emergency Department on 08/22/2018 for the symptoms described in the history of present illness. She was evaluated in the context of the global COVID-19 pandemic, which necessitated consideration that the patient might be at risk for infection with the SARS-CoV-2 virus that causes COVID-19. Institutional protocols and algorithms that pertain to the evaluation of patients at risk for COVID-19 are in a state of rapid change based on information released by regulatory bodies including the CDC and federal and state organizations. These policies and algorithms were followed during the patient's care in the ED.   Patient presents with fever tachycardia overall very well-appearing, not septic appearing.  Sepsis work-up pursued with  lactate and cultures.  IV ceftriaxone given.  Clinical Course as of Aug 21 229  Sat Aug 22, 2018  0204 Labs overall unremarkable except for elevated LFTs.  CT scan of the abdomen and pelvis is unremarkable, negative for choledocholithiasis.  She does not have pancreatitis.  Labs only significant for evidence of persistent UTI.  Procalcitonin is low, lactate is normal.  Vital  signs dramatically improved with IV fluid hydration.  She started Cipro today.  I will offer hospitalization, but think would be reasonable for her to follow-up outpatient if she prefers.  In that case I will plan to continue beta-lactam as well as her Cipro and have her continue following up with her doctor.   [PS]    Clinical Course User Index [PS] Carrie Mew, MD    ----------------------------------------- 2:35 AM on 08/22/2018 -----------------------------------------  Patient prefers to go home after engaging in shared decision making and offering admission to her.  She understands she can come back if anything worsens or she changes her mind.  I think this is reasonable and she and I agree that she will continue Cipro and start [place] and follow-up with her doctor.  I discussed the results of the elevated LFTs with her and she will follow-up with her doctor regarding this finding as well.   ____________________________________________   FINAL CLINICAL IMPRESSION(S) / ED DIAGNOSES    Final diagnoses:  Cystitis  Elevated LFTs     ED Discharge Orders         Ordered    cefdinir (OMNICEF) 300 MG capsule  2 times daily     08/22/18 0226          Portions of this note were generated with dragon dictation software. Dictation errors may occur despite best attempts at proofreading.   Carrie Mew, MD 08/22/18 [phone]

## 2018-08-22 NOTE — Progress Notes (Signed)
CODE SEPSIS - PHARMACY COMMUNICATION  **Broad Spectrum Antibiotics should be administered within 1 hour of Sepsis diagnosis**  Time Code Sepsis Called/Page Received: 2336  Antibiotics Ordered: ceftriaxone  Time of 1st antibiotic administration: 0031  Additional action taken by pharmacy:   If necessary, Name of Provider/Nurse Contacted:     Tobie Lords ,PharmD Clinical Pharmacist  08/22/2018  1:37 AM

## 2018-08-22 NOTE — ED Notes (Signed)
Discharge instructions reviewed with patient. Questions fielded by this RN. Patient verbalizes understanding of instructions. Patient discharged home in stable condition per [facility]. No acute distress noted at time of discharge.    Peripheral IV discontinued. Catheter intact. No signs of infiltration or redness. Gauze applied to IV site.   Pt ambulatory to lobby for daughter to pickup

## 2018-08-22 NOTE — ED Notes (Signed)
Patient transported to CT 

## 2018-08-22 NOTE — ED Notes (Signed)
Pt ambulatory to toilet

## 2018-08-22 NOTE — Discharge Instructions (Signed)
Continue taking your Cipro and start [place] as well.  Your test today showed persistence of the urinary tract infection, as well as elevation of your liver function tests.  Please follow-up with your doctor regarding this finding.  A CT scan of the abdomen and pelvis was unremarkable today.

## 2018-08-23 LAB — URINE CULTURE: Culture: NO GROWTH

## 2018-08-24 ENCOUNTER — Ambulatory Visit: Payer: Self-pay | Admitting: Nurse Practitioner

## 2018-08-27 LAB — CULTURE, BLOOD (ROUTINE X 2)
Culture: NO GROWTH
Culture: NO GROWTH
Special Requests: ADEQUATE

## 2018-09-02 ENCOUNTER — Other Ambulatory Visit: Payer: Self-pay

## 2018-09-02 ENCOUNTER — Other Ambulatory Visit
Admission: RE | Admit: 2018-09-02 | Discharge: 2018-09-02 | Disposition: A | Discharge status: Home / Self Care | Payer: Medicare Other | Source: Ambulatory Visit | Attending: Nurse Practitioner | Admitting: Nurse Practitioner

## 2018-09-02 DIAGNOSIS — N3000 Acute cystitis without hematuria: Secondary | ICD-10-CM | POA: Insufficient documentation

## 2018-09-02 LAB — URINALYSIS, ROUTINE W REFLEX MICROSCOPIC
Bilirubin Urine: NEGATIVE
Glucose, UA: NEGATIVE mg/dL
Hgb urine dipstick: NEGATIVE
Ketones, ur: NEGATIVE mg/dL
Nitrite: NEGATIVE
Protein, ur: NEGATIVE mg/dL
Specific Gravity, Urine: 1.003 — ABNORMAL LOW (ref 1.005–1.030)
pH: 6 (ref 5.0–8.0)

## 2018-09-03 LAB — URINE CULTURE: Culture: 20000 — AB

## 2018-09-22 ENCOUNTER — Other Ambulatory Visit: Payer: Self-pay

## 2018-09-22 MED ORDER — ROSUVASTATIN CALCIUM 20 MG PO TABS: 20.0000 mg | ORAL_TABLET | Freq: Every day | ORAL | 3 refills | Status: DC

## 2018-09-28 ENCOUNTER — Encounter: Payer: Self-pay | Admitting: Nurse Practitioner

## 2018-09-28 ENCOUNTER — Other Ambulatory Visit: Payer: Self-pay

## 2018-09-28 ENCOUNTER — Ambulatory Visit (INDEPENDENT_AMBULATORY_CARE_PROVIDER_SITE_OTHER): Payer: Medicare Other | Admitting: Nurse Practitioner

## 2018-09-28 VITALS — BP 152/82 | HR 70 | Resp 16 | Ht 65.0 in | Wt 182.4 lb

## 2018-09-28 DIAGNOSIS — K581 Irritable bowel syndrome with constipation: Secondary | ICD-10-CM | POA: Diagnosis not present

## 2018-09-28 DIAGNOSIS — F411 Generalized anxiety disorder: Secondary | ICD-10-CM | POA: Diagnosis not present

## 2018-09-28 DIAGNOSIS — I10 Essential (primary) hypertension: Secondary | ICD-10-CM

## 2018-09-28 MED ORDER — CLONAZEPAM 0.5 MG PO TABS: ORAL_TABLET | ORAL | 3 refills | Status: DC

## 2018-09-28 MED ORDER — LINACLOTIDE 290 MCG PO CAPS: 290.0000 ug | ORAL_CAPSULE | Freq: Every day | ORAL | 3 refills | Status: AC

## 2018-09-28 NOTE — Progress Notes (Signed)
[facility] [place], [place] [address]  Internal MEDICINE  Office Visit Note  Patient Name: Amanda Duke  [id number]  [id number]  Date of Service: 10/06/2018  Chief Complaint  Patient presents with  . Medical Management of Chronic Issues    follow up medication refill   . Constipation    pt have been taking lizess for a while now and has notice someconstipation a little over a month now.    The patient is here for routine follow up. She is having trouble with constipation. Had been doing well on Linzess 178mcg daily. She states that over the last month, she has noted longer intervals between bowel movements along with abdominal discomfort. She states that she had been under some family related stress, lost her appetite, and states that this may have thrown her system off as well as being treated for uti.  She also needs to have refills for her clonazepam today. She takes this at bedtime only. States that it really helps her to sleep well.       Current Medication: Outpatient Encounter Medications as of 09/28/2018  Medication Sig  . benzonatate (TESSALON) 200 MG capsule Take 1 capsule (200 mg total) by mouth 2 (two) times daily as needed for cough.  . cefdinir (OMNICEF) 300 MG capsule Take 1 capsule (300 mg total) by mouth 2 (two) times daily.  . cholecalciferol (VITAMIN D) 1000 units tablet Take 1,000 Units by mouth daily.  . clonazePAM (KLONOPIN) 0.5 MG tablet TAKE 1 TABLET BY MOUTH AT BEDTIME AS NEEDED INSOMNIA/ANXIETY  . diltiazem (CARDIZEM CD) 120 MG 24 hr capsule Take 120 mg by mouth daily.   . fluticasone (FLONASE) 50 MCG/ACT nasal spray Place 2 sprays into the nose.  Amanda Duke Ethyl (VASCEPA) 1 g CAPS Take 1 g by mouth 2 (two) times daily.   Marland Kitchen linaclotide (LINZESS) 290 MCG CAPS capsule Take 1 capsule (290 mcg total) by mouth daily.  Marland Kitchen losartan (COZAAR) 50 MG tablet Take 50 mg by mouth daily.   . ondansetron (ZOFRAN) 4 MG tablet Take 1 tablet (4  mg total) by mouth every 8 (eight) hours as needed for nausea or vomiting.  . pantoprazole (PROTONIX) 40 MG tablet Take 40 mg by mouth daily.   . rosuvastatin (CRESTOR) 20 MG tablet Take 1 tablet (20 mg total) by mouth daily.  . [DISCONTINUED] clonazePAM (KLONOPIN) 0.5 MG tablet TAKE 1 TABLET BY MOUTH AT BEDTIME AS NEEDED INSOMNIA/ANXIETY (Patient taking differently: Take 0.5 mg by mouth at bedtime as needed for anxiety (insomnia). )  . [DISCONTINUED] LINZESS 145 MCG CAPS capsule TAKE 1 CAPSULE BY MOUTH EVERY DAY AS NEEDED (Patient taking differently: Take 145 mcg by mouth daily before breakfast. )  . [DISCONTINUED] ciprofloxacin (CIPRO) 500 MG tablet Take 1 tablet (500 mg total) by mouth 2 (two) times daily. (Patient not taking: Reported on 09/28/2018)   No facility-administered encounter medications on file as of 09/28/2018.     Surgical History: Past Surgical History:  Procedure Laterality Date  . APPENDECTOMY  1971  . ARTHRODESIS  1980  . BACK SURGERY  1980  . COLONOSCOPY WITH PROPOFOL N/A 12/12/2016   Procedure: COLONOSCOPY WITH PROPOFOL;  Surgeon: Lollie Sails, MD;  Location: [facility] ENDOSCOPY;  Service: Endoscopy;  Laterality: N/A;  . ESOPHAGOGASTRODUODENOSCOPY (EGD) WITH PROPOFOL N/A 12/12/2016   Procedure: ESOPHAGOGASTRODUODENOSCOPY (EGD) WITH PROPOFOL;  Surgeon: Lollie Sails, MD;  Location: [facility] ENDOSCOPY;  Service: Endoscopy;  Laterality: N/A;  . LAPAROSCOPIC UNILATERAL SALPINGO  [place]   right  . TONSILLECTOMY  1948  . TUBAL LIGATION  1971  . VAGINAL HYSTERECTOMY  1985   d/t heavy bleeding    Medical History: Past Medical History:  Diagnosis Date  . Anxiety   . Coronary artery disease   . GERD (gastroesophageal reflux disease)   . Hyperlipemia   . Hypertension   . Irregular heart beat   . Pre-diabetes   . Prediabetes   . Renal cyst    bilateral    Family History: Family History  Problem Relation Age of Onset  . Heart failure Mother   . Stroke  Mother   . Breast cancer Mother        dx at age 37 yo  . Diabetes Mother   . Osteoporosis Mother   . Emphysema Father   . Ovarian cancer Sister        dx at age 50 yo  . Asthma Sister     Social History   Socioeconomic History  . Marital status: Widowed    Spouse name: Not on file  . Number of children: Not on file  . Years of education: Not on file  . Highest education level: Not on file  Occupational History  . Not on file  Social Needs  . Financial resource strain: Not on file  . Food insecurity:    Worry: Not on file    Inability: Not on file  . Transportation needs:    Medical: Not on file    Non-medical: Not on file  Tobacco Use  . Smoking status: Former Research scientist (life sciences)  . Smokeless tobacco: Never Used  Substance and Sexual Activity  . Alcohol use: No    Alcohol/week: 0.0 standard drinks  . Drug use: Not Currently  . Sexual activity: Not Currently  Lifestyle  . Physical activity:    Days per week: Not on file    Minutes per session: Not on file  . Stress: Not on file  Relationships  . Social connections:    Talks on phone: Not on file    Gets together: Not on file    Attends religious service: Not on file    Active member of club or organization: Not on file    Attends meetings of clubs or organizations: Not on file    Relationship status: Not on file  . Intimate partner violence:    Fear of current or ex partner: Not on file    Emotionally abused: Not on file    Physically abused: Not on file    Forced sexual activity: Not on file  Other Topics Concern  . Not on file  Social History Narrative  . Not on file      Review of Systems  Constitutional: Negative for appetite change, chills, fatigue, fever and unexpected weight change.  HENT: Negative for congestion, postnasal drip, rhinorrhea, sneezing and sore throat.   Respiratory: Negative for cough, chest tightness and shortness of breath.   Cardiovascular: Negative for chest pain and palpitations.   Gastrointestinal: Positive for constipation and nausea. Negative for abdominal pain and diarrhea.  Musculoskeletal: Negative for arthralgias, joint swelling and neck pain.  Skin: Negative for rash.  Neurological: Positive for headaches. Negative for tremors and numbness.  Hematological: Negative for adenopathy. Does not bruise/bleed easily.  Psychiatric/Behavioral: Negative for behavioral problems (Depression), sleep disturbance and suicidal ideas. The patient is nervous/anxious.     Today's Vitals   09/28/18 1133  BP: (!) 152/82  Pulse: 70  Resp: 16  SpO2: 98%  Weight: 182 lb 6.4 oz (82.7 kg)  Height: 5\' 5"  (1.651 m)   Body mass index is 30.35 kg/m.  Physical Exam Vitals signs and nursing note reviewed.  Constitutional:      General: She is not in acute distress.    Appearance: Normal appearance. She is well-developed. She is not diaphoretic.  HENT:     Head: Normocephalic and atraumatic.     Nose: Nose normal.     Mouth/Throat:     Mouth: Mucous membranes are moist.     Pharynx: Oropharynx is clear. No oropharyngeal exudate.  Eyes:     Extraocular Movements: Extraocular movements intact.     Pupils: Pupils are equal, round, and reactive to light.  Neck:     Musculoskeletal: Normal range of motion and neck supple.     Thyroid: No thyromegaly.     Vascular: No carotid bruit or JVD.     Trachea: No tracheal deviation.  Cardiovascular:     Rate and Rhythm: Normal rate and regular rhythm.     Heart sounds: Normal heart sounds. No murmur. No friction rub. No gallop.   Pulmonary:     Effort: Pulmonary effort is normal. No respiratory distress.     Breath sounds: Normal breath sounds. No wheezing or rales.  Chest:     Chest wall: No tenderness.  Abdominal:     General: Bowel sounds are normal.     Palpations: Abdomen is soft.     Tenderness: There is no abdominal tenderness.  Musculoskeletal: Normal range of motion.  Lymphadenopathy:     Cervical: No cervical  adenopathy.  Skin:    General: Skin is warm and dry.  Neurological:     General: No focal deficit present.     Mental Status: She is alert and oriented to person, place, and time.     Cranial Nerves: No cranial nerve deficit.  Psychiatric:        Mood and Affect: Mood normal.        Behavior: Behavior normal.        Thought Content: Thought content normal.        Judgment: Judgment normal.    Assessment/Plan: 1. Benign essential HTN Stable. Continue bp medication as prescribed   2. Irritable bowel syndrome with constipation Increase dose of linzess 246mcg daily. Ensure proper amounts of fiber and water in the diet.  - linaclotide (LINZESS) 290 MCG CAPS capsule; Take 1 capsule (290 mcg total) by mouth daily.  Dispense: 30 capsule; Refill: 3  3. Generalized anxiety disorder May continue to take clonazepam 0.5mg  at bedtime as needed for acute anxiety. New prescription was sent to her pharmacy today.  - clonazePAM (KLONOPIN) 0.5 MG tablet; TAKE 1 TABLET BY MOUTH AT BEDTIME AS NEEDED INSOMNIA/ANXIETY  Dispense: 30 tablet; Refill: 3  General Counseling: Carrisa verbalizes understanding of the findings of todays visit and agrees with plan of treatment. I have discussed any further diagnostic evaluation that may be needed or ordered today. We also reviewed her medications today. she has been encouraged to call the office with any questions or concerns that should arise related to todays visit.  This patient was seen by [place] with Dr Lavera Guise as a part of collaborative care agreement  Meds ordered this encounter  Medications  . linaclotide (LINZESS) 290 MCG CAPS capsule    Sig: Take 1 capsule (290 mcg total) by mouth daily.    Dispense:  30  capsule    Refill:  3    Please note increased dosing.    Order Specific Question:   Supervising Provider    Answer:   Lavera Guise [[id number]]  . clonazePAM (KLONOPIN) 0.5 MG tablet    Sig: TAKE 1 TABLET BY MOUTH AT BEDTIME  AS NEEDED INSOMNIA/ANXIETY    Dispense:  30 tablet    Refill:  3    Order Specific Question:   Supervising Provider    Answer:   Lavera Guise [[id number]]    Time spent: 44 Minutes      Dr Lavera Guise Internal medicine

## 2018-09-28 NOTE — Progress Notes (Signed)
Pt blood pressure elevated, Taken twice  1st reading 172/82 2nd reading 152/82 Pt stated she just took blood pressure medication 1hr prior to visit.

## 2018-11-12 ENCOUNTER — Encounter: Payer: Self-pay | Admitting: Nurse Practitioner

## 2018-11-12 ENCOUNTER — Other Ambulatory Visit: Payer: Self-pay

## 2018-11-12 ENCOUNTER — Ambulatory Visit (INDEPENDENT_AMBULATORY_CARE_PROVIDER_SITE_OTHER): Payer: Medicare Other | Admitting: Nurse Practitioner

## 2018-11-12 VITALS — BP 145/81 | Ht 65.0 in | Wt 185.0 lb

## 2018-11-12 DIAGNOSIS — N390 Urinary tract infection, site not specified: Secondary | ICD-10-CM | POA: Diagnosis not present

## 2018-11-12 DIAGNOSIS — R300 Dysuria: Secondary | ICD-10-CM | POA: Diagnosis not present

## 2018-11-12 MED ORDER — CIPROFLOXACIN HCL 500 MG PO TABS: 500.0000 mg | ORAL_TABLET | Freq: Two times a day (BID) | ORAL | 0 refills | Status: DC

## 2018-11-12 MED ORDER — PHENAZOPYRIDINE HCL 200 MG PO TABS: 200.0000 mg | ORAL_TABLET | Freq: Three times a day (TID) | ORAL | 0 refills | Status: DC | PRN

## 2018-11-12 NOTE — Progress Notes (Signed)
[facility] [place], [place] [address]  Internal MEDICINE  Telephone Visit  Patient Name: Amanda Duke  [id number]  [id number]  Date of Service: 11/22/2018  I connected with the patient at 5:12pm by telephone and verified the patients identity using two identifiers.   I discussed the limitations, risks, security and privacy concerns of performing an evaluation and management service by telephone and the availability of in person appointments. I also discussed with the patient that there may be a patient responsible charge related to the service.  The patient expressed understanding and agrees to proceed.    Chief Complaint  Patient presents with  . Telephone Assessment  . Telephone Screen  . Urinary Tract Infection    urine frequency    The patient has been contacted via telephone for follow up visit due to concerns for spread of novel coronavirus. She is complaining of painful urination and urinary frequency. Has been going on for last several days. She does admit to having some flank pain. Denies abdominal pain, nausea, vomiting, or diarrhea. She denies fever or headache.       Current Medication: Outpatient Encounter Medications as of 11/12/2018  Medication Sig  . benzonatate (TESSALON) 200 MG capsule Take 1 capsule (200 mg total) by mouth 2 (two) times daily as needed for cough.  . cefdinir (OMNICEF) 300 MG capsule Take 1 capsule (300 mg total) by mouth 2 (two) times daily.  . cholecalciferol (VITAMIN D) 1000 units tablet Take 1,000 Units by mouth daily.  . clonazePAM (KLONOPIN) 0.5 MG tablet TAKE 1 TABLET BY MOUTH AT BEDTIME AS NEEDED INSOMNIA/ANXIETY  . diltiazem (CARDIZEM CD) 120 MG 24 hr capsule Take 120 mg by mouth daily.   . fluticasone (FLONASE) 50 MCG/ACT nasal spray Place 2 sprays into the nose.  Vanessa Kick Ethyl (VASCEPA) 1 g CAPS Take 1 g by mouth 2 (two) times daily.   Marland Kitchen linaclotide (LINZESS) 290 MCG CAPS capsule Take 1 capsule (290  mcg total) by mouth daily.  Marland Kitchen losartan (COZAAR) 50 MG tablet Take 50 mg by mouth daily.   . ondansetron (ZOFRAN) 4 MG tablet Take 1 tablet (4 mg total) by mouth every 8 (eight) hours as needed for nausea or vomiting.  . pantoprazole (PROTONIX) 40 MG tablet Take 40 mg by mouth daily.   . rosuvastatin (CRESTOR) 20 MG tablet Take 1 tablet (20 mg total) by mouth daily.  . ciprofloxacin (CIPRO) 500 MG tablet Take 1 tablet (500 mg total) by mouth 2 (two) times daily.  . phenazopyridine (PYRIDIUM) 200 MG tablet Take 1 tablet (200 mg total) by mouth 3 (three) times daily as needed for pain.   No facility-administered encounter medications on file as of 11/12/2018.     Surgical History: Past Surgical History:  Procedure Laterality Date  . APPENDECTOMY  1971  . ARTHRODESIS  1980  . BACK SURGERY  1980  . COLONOSCOPY WITH PROPOFOL N/A 12/12/2016   Procedure: COLONOSCOPY WITH PROPOFOL;  Surgeon: Lollie Sails, MD;  Location: [facility] ENDOSCOPY;  Service: Endoscopy;  Laterality: N/A;  . ESOPHAGOGASTRODUODENOSCOPY (EGD) WITH PROPOFOL N/A 12/12/2016   Procedure: ESOPHAGOGASTRODUODENOSCOPY (EGD) WITH PROPOFOL;  Surgeon: Lollie Sails, MD;  Location: [facility] ENDOSCOPY;  Service: Endoscopy;  Laterality: N/A;  . [place]   right  . TONSILLECTOMY  1948  . TUBAL LIGATION  1971  . VAGINAL HYSTERECTOMY  1985   d/t heavy bleeding    Medical History: Past Medical History:  Diagnosis  Date  . Anxiety   . Coronary artery disease   . GERD (gastroesophageal reflux disease)   . Hyperlipemia   . Hypertension   . Irregular heart beat   . Pre-diabetes   . Prediabetes   . Renal cyst    bilateral    Family History: Family History  Problem Relation Age of Onset  . Heart failure Mother   . Stroke Mother   . Breast cancer Mother        dx at age 72 yo  . Diabetes Mother   . Osteoporosis Mother   . Emphysema Father   . Ovarian cancer Sister        dx at age 56  yo  . Asthma Sister     Social History   Socioeconomic History  . Marital status: Widowed    Spouse name: Not on file  . Number of children: Not on file  . Years of education: Not on file  . Highest education level: Not on file  Occupational History  . Not on file  Social Needs  . Financial resource strain: Not on file  . Food insecurity    Worry: Not on file    Inability: Not on file  . Transportation needs    Medical: Not on file    Non-medical: Not on file  Tobacco Use  . Smoking status: Former Research scientist (life sciences)  . Smokeless tobacco: Never Used  Substance and Sexual Activity  . Alcohol use: No    Alcohol/week: 0.0 standard drinks  . Drug use: Not Currently  . Sexual activity: Not Currently  Lifestyle  . Physical activity    Days per week: Not on file    Minutes per session: Not on file  . Stress: Not on file  Relationships  . Social Herbalist on phone: Not on file    Gets together: Not on file    Attends religious service: Not on file    Active member of club or organization: Not on file    Attends meetings of clubs or organizations: Not on file    Relationship status: Not on file  . Intimate partner violence    Fear of current or ex partner: Not on file    Emotionally abused: Not on file    Physically abused: Not on file    Forced sexual activity: Not on file  Other Topics Concern  . Not on file  Social History Narrative  . Not on file      Review of Systems  Constitutional: Negative for chills, fatigue and unexpected weight change.  HENT: Negative for congestion, postnasal drip, rhinorrhea, sneezing and sore throat.   Respiratory: Negative for cough, chest tightness and shortness of breath.   Cardiovascular: Negative for chest pain and palpitations.  Gastrointestinal: Negative for abdominal pain, constipation, diarrhea, nausea and vomiting.  Genitourinary: Positive for dysuria, flank pain, frequency and urgency.  Musculoskeletal: Negative for  arthralgias, back pain, joint swelling and neck pain.  Skin: Negative for rash.  Neurological: Negative for dizziness, tremors, numbness and headaches.  Hematological: Negative for adenopathy. Does not bruise/bleed easily.  Psychiatric/Behavioral: Negative for behavioral problems (Depression), sleep disturbance and suicidal ideas. The patient is not nervous/anxious.     Today's Vitals   11/12/18 1641  BP: (!) 145/81  Weight: 185 lb (83.9 kg)  Height: 5\' 5"  (1.651 m)   Body mass index is 30.79 kg/m.  Observation/Objective:   The patient is alert and oriented. She is pleasant and answers  all questions appropriately. Breathing is non-labored. She is in no acute distress at this time.    Assessment/Plan: 1. Urinary tract infection without hematuria, site unspecified Start cipro 500mg  twice daily for 10 days. Increase water intake and rest  - ciprofloxacin (CIPRO) 500 MG tablet; Take 1 tablet (500 mg total) by mouth 2 (two) times daily.  Dispense: 20 tablet; Refill: 0  2. Dysuria May take pyridium 200mg  up to three times daily for next few days to relieve bladder pain and spasms.  - phenazopyridine (PYRIDIUM) 200 MG tablet; Take 1 tablet (200 mg total) by mouth 3 (three) times daily as needed for pain.  Dispense: 10 tablet; Refill: 0  General Counseling: Tiari verbalizes understanding of the findings of today's phone visit and agrees with plan of treatment. I have discussed any further diagnostic evaluation that may be needed or ordered today. We also reviewed her medications today. she has been encouraged to call the office with any questions or concerns that should arise related to todays visit.  This patient was seen by [place] with Dr Lavera Guise as a part of collaborative care agreement  Meds ordered this encounter  Medications  . ciprofloxacin (CIPRO) 500 MG tablet    Sig: Take 1 tablet (500 mg total) by mouth 2 (two) times daily.    Dispense:  20  tablet    Refill:  0    Order Specific Question:   Supervising Provider    Answer:   Lavera Guise [[id number]]  . phenazopyridine (PYRIDIUM) 200 MG tablet    Sig: Take 1 tablet (200 mg total) by mouth 3 (three) times daily as needed for pain.    Dispense:  10 tablet    Refill:  0    Order Specific Question:   Supervising Provider    Answer:   Lavera Guise [[id number]]    Time spent: 50 Minutes    Dr Lavera Guise Internal medicine

## 2018-11-27 DIAGNOSIS — I2510 Atherosclerotic heart disease of native coronary artery without angina pectoris: Secondary | ICD-10-CM | POA: Diagnosis not present

## 2018-11-27 DIAGNOSIS — I10 Essential (primary) hypertension: Secondary | ICD-10-CM | POA: Diagnosis not present

## 2018-11-27 DIAGNOSIS — I471 Supraventricular tachycardia: Secondary | ICD-10-CM | POA: Diagnosis not present

## 2018-11-27 DIAGNOSIS — E782 Mixed hyperlipidemia: Secondary | ICD-10-CM | POA: Diagnosis not present

## 2018-12-15 ENCOUNTER — Other Ambulatory Visit: Payer: Self-pay

## 2018-12-15 MED ORDER — ROSUVASTATIN CALCIUM 20 MG PO TABS: 20.0000 mg | ORAL_TABLET | Freq: Every day | ORAL | 3 refills | Status: DC

## 2018-12-25 DIAGNOSIS — I2510 Atherosclerotic heart disease of native coronary artery without angina pectoris: Secondary | ICD-10-CM | POA: Diagnosis not present

## 2018-12-25 DIAGNOSIS — E782 Mixed hyperlipidemia: Secondary | ICD-10-CM | POA: Diagnosis not present

## 2018-12-25 DIAGNOSIS — I10 Essential (primary) hypertension: Secondary | ICD-10-CM | POA: Diagnosis not present

## 2018-12-25 DIAGNOSIS — I471 Supraventricular tachycardia: Secondary | ICD-10-CM | POA: Diagnosis not present

## 2018-12-29 ENCOUNTER — Encounter: Payer: Self-pay | Admitting: Obstetrics and Gynecology

## 2018-12-29 ENCOUNTER — Ambulatory Visit (INDEPENDENT_AMBULATORY_CARE_PROVIDER_SITE_OTHER): Payer: Medicare Other | Admitting: Obstetrics and Gynecology

## 2018-12-29 ENCOUNTER — Encounter: Payer: Medicare Other | Admitting: Obstetrics and Gynecology

## 2018-12-29 ENCOUNTER — Other Ambulatory Visit: Payer: Self-pay

## 2018-12-29 VITALS — BP 153/90 | HR 70 | Ht 65.0 in | Wt 182.5 lb

## 2018-12-29 DIAGNOSIS — Z8041 Family history of malignant neoplasm of ovary: Secondary | ICD-10-CM | POA: Diagnosis not present

## 2018-12-29 DIAGNOSIS — M810 Age-related osteoporosis without current pathological fracture: Secondary | ICD-10-CM | POA: Diagnosis not present

## 2018-12-29 DIAGNOSIS — Z01419 Encounter for gynecological examination (general) (routine) without abnormal findings: Secondary | ICD-10-CM | POA: Diagnosis not present

## 2018-12-29 DIAGNOSIS — E782 Mixed hyperlipidemia: Secondary | ICD-10-CM

## 2018-12-29 DIAGNOSIS — R32 Unspecified urinary incontinence: Secondary | ICD-10-CM

## 2018-12-29 DIAGNOSIS — Z803 Family history of malignant neoplasm of breast: Secondary | ICD-10-CM | POA: Diagnosis not present

## 2018-12-29 DIAGNOSIS — N819 Female genital prolapse, unspecified: Secondary | ICD-10-CM

## 2018-12-29 DIAGNOSIS — R7303 Prediabetes: Secondary | ICD-10-CM

## 2018-12-29 DIAGNOSIS — I10 Essential (primary) hypertension: Secondary | ICD-10-CM

## 2018-12-29 DIAGNOSIS — N993 Prolapse of vaginal vault after hysterectomy: Secondary | ICD-10-CM

## 2018-12-29 DIAGNOSIS — N390 Urinary tract infection, site not specified: Secondary | ICD-10-CM

## 2018-12-29 NOTE — Progress Notes (Signed)
ANNUAL PREVENTATIVE CARE GYNECOLOGY  ENCOUNTER NOTE  Subjective:       Amanda Duke is a 78 y.o. [phone] female here for a routine annual gynecologic exam.  She is transitioning care from Dr. Hassell Done Defrancesco who has retired from Systems analyst. The patient is not sexually active. The patient is not taking hormone replacement therapy. Patient denies post-menopausal vaginal bleeding. The patient wears seatbelts: yes. The patient participates in regular exercise: yes (walking several days per week). Has the patient ever been transfused or tattooed?: no. The patient reports that there is not domestic violence in her life.  Current complaints: 1.  None. Patient undergoes yearly surveillance for ovarian cancer due to family history (in sister in her 59's). She also has a family history of breast cancer in her mother.  Has tested negative for BRCA.  Has left ovary and part of right ovary remaining after remote hysterectomy.    Gynecologic History No LMP recorded. Patient has had a hysterectomy. Contraception: status post hysterectomy Last Pap: Not needed. Patient beyond recommended age of screening.  Last mammogram: 01/2018. Results were: normal (reviewed in Care Everywhere) Last Colonoscopy: ~ 2 years ago. Notes she is due for repeat this year to findings of a precancerous polyp.  Last Dexa Scan: 11/2015, Osteopenia present (T score -2.0 in lumbar spine and  femoral neck)   Obstetric History OB History  Gravida Para Term Preterm AB Living  _0 SAB TAB Ectopic Multiple Live Births  1       7    # Outcome Date GA Lbr Len/2nd Weight Sex Delivery Anes PTL Lv  8 Term 1971   6 lb 14 oz (3.118 kg)  Vag-Spont   LIV  7 Term 1969   6 lb 13 oz (3.09 kg) M Vag-Spont   LIV  6 Term 1965   6 lb 11 oz (3.033 kg) F Vag-Spont   LIV  5 Term 1964   6 lb 15 oz (3.147 kg) M Vag-Spont   LIV  4 Term 1963   6 lb 11 oz (3.033 kg) F Vag-Spont   LIV     Complications: Hemorrhage  3 Term 1962   5 lb  11 oz (2.58 kg) F Vag-Spont   LIV  2 SAB 1961        FD  1 Term 1960   6 lb 14 oz (3.118 kg) M Vag-Spont   LIV    Past Medical History:  Diagnosis Date  . Anxiety   . Coronary artery disease   . GERD (gastroesophageal reflux disease)   . Hyperlipemia   . Hypertension   . Irregular heart beat   . Pre-diabetes   . Prediabetes   . Renal cyst    bilateral    Family History  Problem Relation Age of Onset  . Heart failure Mother   . Stroke Mother   . Breast cancer Mother        dx at age 91 yo  . Diabetes Mother   . Osteoporosis Mother   . Emphysema Father   . Ovarian cancer Sister        dx at age 15 yo  . Asthma Sister     Past Surgical History:  Procedure Laterality Date  . APPENDECTOMY  1971  . ARTHRODESIS  1980  . BACK SURGERY  1980  . COLONOSCOPY WITH PROPOFOL N/A 12/12/2016   Procedure: COLONOSCOPY WITH PROPOFOL;  Surgeon: Lollie Sails, MD;  Location: [facility] ENDOSCOPY;  Service: Endoscopy;  Laterality: N/A;  . ESOPHAGOGASTRODUODENOSCOPY (EGD) WITH PROPOFOL N/A 12/12/2016   Procedure: ESOPHAGOGASTRODUODENOSCOPY (EGD) WITH PROPOFOL;  Surgeon: Lollie Sails, MD;  Location: [facility] ENDOSCOPY;  Service: Endoscopy;  Laterality: N/A;  . [place]   right  . TONSILLECTOMY  1948  . TUBAL LIGATION  1971  . VAGINAL HYSTERECTOMY  1985   d/t heavy bleeding    Social History   Socioeconomic History  . Marital status: Widowed    Spouse name: Not on file  . Number of children: Not on file  . Years of education: Not on file  . Highest education level: Not on file  Occupational History  . Not on file  Social Needs  . Financial resource strain: Not on file  . Food insecurity    Worry: Not on file    Inability: Not on file  . Transportation needs    Medical: Not on file    Non-medical: Not on file  Tobacco Use  . Smoking status: Former Research scientist (life sciences)  . Smokeless tobacco: Never Used  Substance and Sexual Activity  . Alcohol  use: No    Alcohol/week: 0.0 standard drinks  . Drug use: Not Currently  . Sexual activity: Not Currently  Lifestyle  . Physical activity    Days per week: Not on file    Minutes per session: Not on file  . Stress: Not on file  Relationships  . Social Herbalist on phone: Not on file    Gets together: Not on file    Attends religious service: Not on file    Active member of club or organization: Not on file    Attends meetings of clubs or organizations: Not on file    Relationship status: Not on file  . Intimate partner violence    Fear of current or ex partner: Not on file    Emotionally abused: Not on file    Physically abused: Not on file    Forced sexual activity: Not on file  Other Topics Concern  . Not on file  Social History Narrative  . Not on file    Current Outpatient Medications on File Prior to Visit  Medication Sig Dispense Refill  . benzonatate (TESSALON) 200 MG capsule Take 1 capsule (200 mg total) by mouth 2 (two) times daily as needed for cough. 30 capsule 1  . carvedilol (COREG CR) 10 MG 24 hr capsule Take 10 mg by mouth daily.    . cefdinir (OMNICEF) 300 MG capsule Take 1 capsule (300 mg total) by mouth 2 (two) times daily. 14 capsule 0  . cholecalciferol (VITAMIN D) 1000 units tablet Take 1,000 Units by mouth daily.    . clonazePAM (KLONOPIN) 0.5 MG tablet TAKE 1 TABLET BY MOUTH AT BEDTIME AS NEEDED INSOMNIA/ANXIETY 30 tablet 3  . diltiazem (CARDIZEM CD) 120 MG 24 hr capsule Take 120 mg by mouth daily.     . fluticasone (FLONASE) 50 MCG/ACT nasal spray Place 2 sprays into the nose.    Vanessa Kick Ethyl (VASCEPA) 1 g CAPS Take 1 g by mouth 2 (two) times daily.     Marland Kitchen linaclotide (LINZESS) 290 MCG CAPS capsule Take 1 capsule (290 mcg total) by mouth daily. 30 capsule 3  . losartan (COZAAR) 100 MG tablet Take 100 mg by mouth daily.    . ondansetron (ZOFRAN) 4 MG tablet Take 1 tablet (4 mg total) by mouth every 8 (eight) hours  as needed for nausea or  vomiting. 30 tablet 0  . pantoprazole (PROTONIX) 40 MG tablet Take 40 mg by mouth daily.     . phenazopyridine (PYRIDIUM) 200 MG tablet Take 1 tablet (200 mg total) by mouth 3 (three) times daily as needed for pain. 10 tablet 0  . rosuvastatin (CRESTOR) 20 MG tablet Take 1 tablet (20 mg total) by mouth daily. 30 tablet 3   No current facility-administered medications on file prior to visit.     Allergies  Allergen Reactions  . Amoxicillin Hives  . Clindamycin Diarrhea  . Gemfibrozil Other (See Comments)    muscle ache  . Levofloxacin Other (See Comments)  . Levofloxacin In D5w Other (See Comments)  . Metoprolol Other (See Comments)  . Simvastatin Other (See Comments)    muscle ache  . Sulfa Antibiotics Other (See Comments)  . Nystatin-Triamcinolone Rash      Review of Systems ROS Review of Systems - General ROS: negative for - chills, fatigue, fever, hot flashes, night sweats, weight gain or weight loss Psychological ROS: negative for - anxiety, decreased libido, depression, mood swings, physical abuse or sexual abuse Ophthalmic ROS: negative for - blurry vision, eye pain or loss of vision ENT ROS: negative for - headaches, hearing change, visual changes or vocal changes Allergy and Immunology ROS: negative for - hives, itchy/watery eyes or seasonal allergies Hematological and Lymphatic ROS: negative for - bleeding problems, bruising, swollen lymph nodes or weight loss Endocrine ROS: negative for - galactorrhea, hair pattern changes, hot flashes, malaise/lethargy, mood swings, palpitations, polydipsia/polyuria, skin changes, temperature intolerance or unexpected weight changes Breast ROS: negative for - new or changing breast lumps or nipple discharge Respiratory ROS: negative for - cough or shortness of breath Cardiovascular ROS: negative for - chest pain, irregular heartbeat, palpitations or shortness of breath Gastrointestinal ROS: no abdominal pain, change in bowel habits, or  black or bloody stools Genito-Urinary ROS: no dysuria, trouble voiding, or hematuria.  Patient has a recent history of recurrent UTI's. Has been seen by Urology in the past. Also notes urinary incontinence (cannot sometimes hold urine on the way to the restroom, with accidental small to moderate leaks).  Musculoskeletal ROS: negative for - joint pain or joint stiffness Neurological ROS: negative for - bowel and bladder control changes Dermatological ROS: negative for rash and skin lesion changes   Objective:   BP (!) 153/90   Pulse 70   Ht _0  (1.651 m)   Wt 182 lb 8 oz (82.8 kg)   BMI 30.37 kg/m  CONSTITUTIONAL: Well-developed, well-nourished female in no acute distress.  PSYCHIATRIC: Normal mood and affect. Normal behavior. Normal judgment and thought content. [place]: Alert and oriented to person, place, and time. Normal muscle tone coordination. No cranial nerve deficit noted. HENT:  Normocephalic, atraumatic, External right and left ear normal. Oropharynx is clear and moist EYES: Conjunctivae and EOM are normal. Pupils are equal, round, and reactive to light. No scleral icterus.  NECK: Normal range of motion, supple, no masses.  Normal thyroid.  SKIN: Skin is warm and dry. No rash noted. Not diaphoretic. No erythema. No pallor. CARDIOVASCULAR: Normal heart rate noted, regular rhythm, no murmur. RESPIRATORY: Clear to auscultation bilaterally. Effort and breath sounds normal, no problems with respiration noted. BREASTS: Symmetric in size. No masses, skin changes, nipple drainage, or lymphadenopathy. ABDOMEN: Soft, normal bowel sounds, no distention noted.  No tenderness, rebound or guarding.  BLADDER: Normal PELVIC:  Bladder no bladder distension noted  Urethra: normal appearing  urethra with no masses, tenderness or lesions  Vulva: normal appearing vulva with no masses, tenderness or lesions  Vagina: atrophic and Pelvic Floor Exam vaginal prolapse Grade 2-3 with cystocele  present  Cervix: surgically absent  Uterus: surgically absent, vaginal cuff well healed  Adnexa: normal adnexa in size, nontender and no masses   RV: External Exam NormaI, No Rectal Masses and Normal Sphincter tone  MUSCULOSKELETAL: Normal range of motion. No tenderness.  No cyanosis, clubbing, or edema.  2+ distal pulses. LYMPHATIC: No Axillary, Supraclavicular, or Inguinal Adenopathy.   Labs: Lab Results  Component Value Date   WBC 9.1 08/21/2018   HGB 13.1 08/21/2018   HCT 39.5 08/21/2018   MCV 85.9 08/21/2018   PLT 220 08/21/2018    Lab Results  Component Value Date   CREATININE 1.04 (H) 08/21/2018   BUN 11 08/21/2018   NA 134 (L) 08/21/2018   K 3.3 (L) 08/21/2018   CL 96 (L) 08/21/2018   CO2 24 08/21/2018    Lab Results  Component Value Date   ALT 175 (H) 08/21/2018   AST 177 (H) 08/21/2018   ALKPHOS 227 (H) 08/21/2018   BILITOT 2.0 (H) 08/21/2018    Lab Results  Component Value Date   CHOL 239 (H) 05/01/2018   HDL 48 05/01/2018   LDLCALC 153 (H) 05/01/2018   TRIG 192 (H) 05/01/2018   CHOLHDL 5.0 05/01/2018    Lab Results  Component Value Date   TSH 1.760 05/01/2018    Lab Results  Component Value Date   HGBA1C 5.7 (H) 02/23/2016     Assessment:   1. Family history of ovarian cancer   2. Family history of breast cancer   3. Visit for gynecologic examination   4. Age-related osteoporosis without current pathological fracture   5. 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 Benign essential HTN   9. Mixed hyperlipidemia   10. Prediabetes     Plan:  Pap: Not needed Mammogram: Scheduled for October by PCP Stool Guaiac Testing:  Not Indicated. Patient is due for colonoscopy. To have ordered by PCP.  Labs: No labs ordered up to date. Elevated LFTs noted and abnormal lipids. Patient has seen PCP since this time.  Routine preventative health  maintenance measures emphasized: Exercise/Diet/Weight control, Tobacco Warnings, Alcohol/Substance use risks, Stress Management and Safe Sex Hypertension, osteoporosis, and dyslipidemia managed by PCP.  Stress incontinence likely secondary to vaginal vault prolapse. Patient notes that she had been considering surgical intervention prior to the [place] pandemic. Discussed option of pessary either as a temporary solution until surgery is desired, or as more of a permanent solution. Given handouts.  Continue yearly surveillance for family history of breast and ovarian cancer.  Return to [place].   A total of 25 minutes were spent face-to-face with the patient during this encounter and over half of that time involved counseling and coordination of care.   Rubie Maid, MD  [facility]

## 2018-12-29 NOTE — Patient Instructions (Signed)
Health Maintenance for Postmenopausal Women Menopause is a normal process in which your ability to get pregnant comes to an end. This process happens slowly over many months or years, usually between the ages of 62 and 18. Menopause is complete when you have missed your menstrual periods for 12 months. It is important to talk with your health care provider about some of the most common conditions that affect women after menopause (postmenopausal women). These include heart disease, cancer, and bone loss (osteoporosis). Adopting a healthy lifestyle and getting preventive care can help to promote your health and wellness. The actions you take can also lower your chances of developing some of these common conditions. What should I know about menopause? During menopause, you may get a number of symptoms, such as:  Hot flashes. These can be moderate or severe.  Night sweats.  Decrease in sex drive.  Mood swings.  Headaches.  Tiredness.  Irritability.  Memory problems.  Insomnia. Choosing to treat or not to treat these symptoms is a decision that you make with your health care provider. Do I need hormone replacement therapy?  Hormone replacement therapy is effective in treating symptoms that are caused by menopause, such as hot flashes and night sweats.  Hormone replacement carries certain risks, especially as you become older. If you are thinking about using estrogen or estrogen with progestin, discuss the benefits and risks with your health care provider. What is my risk for heart disease and stroke? The risk of heart disease, heart attack, and stroke increases as you age. One of the causes may be a change in the body's hormones during menopause. This can affect how your body uses dietary fats, triglycerides, and cholesterol. Heart attack and stroke are medical emergencies. There are many things that you can do to help prevent heart disease and stroke. Watch your blood pressure  High  blood pressure causes heart disease and increases the risk of stroke. This is more likely to develop in people who have high blood pressure readings, are of African descent, or are overweight.  Have your blood pressure checked: ? Every 3-5 years if you are 76-7 years of age. ? Every year if you are 45 years old or older. Eat a healthy diet   Eat a diet that includes plenty of vegetables, fruits, low-fat dairy products, and lean protein.  Do not eat a lot of foods that are high in solid fats, added sugars, or sodium. Get regular exercise Get regular exercise. This is one of the most important things you can do for your health. Most adults should:  Try to exercise for at least 150 minutes each week. The exercise should increase your heart rate and make you sweat (moderate-intensity exercise).  Try to do strengthening exercises at least twice each week. Do these in addition to the moderate-intensity exercise.  Spend less time sitting. Even light physical activity can be beneficial. Other tips  Work with your health care provider to achieve or maintain a healthy weight.  Do not use any products that contain nicotine or tobacco, such as cigarettes, e-cigarettes, and chewing tobacco. If you need help quitting, ask your health care provider.  Know your numbers. Ask your health care provider to check your cholesterol and your blood sugar (glucose). Continue to have your blood tested as directed by your health care provider. Do I need screening for cancer? Depending on your health history and family history, you may need to have cancer screening at different stages of your life. This  may include screening for:  Breast cancer.  Cervical cancer.  Lung cancer.  Colorectal cancer. What is my risk for osteoporosis? After menopause, you may be at increased risk for osteoporosis. Osteoporosis is a condition in which bone destruction happens more quickly than new bone creation. To help prevent  osteoporosis or the bone fractures that can happen because of osteoporosis, you may take the following actions:  If you are 48-8 years old, get at least 1,000 mg of calcium and at least 600 mg of vitamin D per day.  If you are older than age 78 but younger than age 61, get at least 1,200 mg of calcium and at least 600 mg of vitamin D per day.  If you are older than age 20, get at least 1,200 mg of calcium and at least 800 mg of vitamin D per day. Smoking and drinking excessive alcohol increase the risk of osteoporosis. Eat foods that are rich in calcium and vitamin D, and do weight-bearing exercises several times each week as directed by your health care provider. How does menopause affect my mental health? Depression may occur at any age, but it is more common as you become older. Common symptoms of depression include:  Low or sad mood.  Changes in sleep patterns.  Changes in appetite or eating patterns.  Feeling an overall lack of motivation or enjoyment of activities that you previously enjoyed.  Frequent crying spells. Talk with your health care provider if you think that you are experiencing depression. General instructions See your health care provider for regular wellness exams and vaccines. This may include:  Scheduling regular health, dental, and eye exams.  Getting and maintaining your vaccines. These include: ? Influenza vaccine. Get this vaccine each year before the flu season begins. ? Pneumonia vaccine. ? Shingles vaccine. ? Tetanus, diphtheria, and pertussis (Tdap) booster vaccine. Your health care provider may also recommend other immunizations. Tell your health care provider if you have ever been abused or do not feel safe at home. Summary  Menopause is a normal process in which your ability to get pregnant comes to an end.  This condition causes hot flashes, night sweats, decreased interest in sex, mood swings, headaches, or lack of sleep.  Treatment for this  condition may include hormone replacement therapy.  Take actions to keep yourself healthy, including exercising regularly, eating a healthy diet, watching your weight, and checking your blood pressure and blood sugar levels.  Get screened for cancer and depression. Make sure that you are up to date with all your vaccines. This information is not intended to replace advice given to you by your health care provider. Make sure you discuss any questions you have with your health care provider. Document Released: 06/07/2005 Document Revised: 04/08/2018 Document Reviewed: 04/08/2018 Elsevier Patient Education  [employer].    Urinary Incontinence  Urinary incontinence refers to a condition in which a person is unable to control where and when to pass urine. A person with this condition will urinate when he or she does not mean to (involuntarily). What are the causes? This condition may be caused by:  Medicines.  Infections.  Constipation.  Overactive bladder muscles.  Weak bladder muscles.  Weak pelvic floor muscles. These muscles provide support for the bladder, intestine, and, in women, the uterus.  Enlarged prostate in men. The prostate is a gland near the bladder. When it gets too big, it can pinch the urethra. With the urethra blocked, the bladder can weaken and lose  the ability to empty properly.  Surgery.  Emotional factors, such as anxiety, stress, or post-traumatic stress disorder (PTSD).  Pelvic organ prolapse. This happens in women when organs shift out of place and into the vagina. This shift can prevent the bladder and urethra from working properly. What increases the risk? The following factors may make you more likely to develop this condition:  Older age.  Obesity and physical inactivity.  Pregnancy and childbirth.  Menopause.  Diseases that affect the nerves or spinal cord (neurological diseases).  Long-term (chronic) coughing. This can increase  pressure on the bladder and pelvic floor muscles. What are the signs or symptoms? Symptoms may vary depending on the type of urinary incontinence you have. They include:  A sudden urge to urinate, but passing urine involuntarily before you can get to a bathroom (urge incontinence).  Suddenly passing urine with any activity that forces urine to pass, such as coughing, laughing, exercise, or sneezing (stress incontinence).  Needing to urinate often, but urinating only a small amount, or constantly dribbling urine (overflow incontinence).  Urinating because you cannot get to the bathroom in time due to a physical disability, such as arthritis or injury, or communication and thinking problems, such as Alzheimer disease (functional incontinence). How is this diagnosed? This condition may be diagnosed based on:  Your medical history.  A physical exam.  Tests, such as: ? Urine tests. ? X-rays of your kidney and bladder. ? Ultrasound. ? CT scan. ? Cystoscopy. In this procedure, a health care provider inserts a tube with a light and camera (cystoscope) through the urethra and into the bladder in order to check for problems. ? Urodynamic testing. These tests assess how well the bladder, urethra, and sphincter can store and release urine. There are different types of urodynamic tests, and they vary depending on what the test is measuring. To help diagnose your condition, your health care provider may recommend that you keep a log of when you urinate and how much you urinate. How is this treated? Treatment for this condition depends on the type of incontinence that you have and its cause. Treatment may include:  Lifestyle changes, such as: ? Quitting smoking. ? Maintaining a healthy weight. ? Staying active. Try to get 150 minutes of moderate-intensity exercise every week. Ask your health care provider which activities are safe for you. ? Eating a healthy diet.  Avoid high-fat foods, like  fried foods.  Avoid refined carbohydrates like white bread and white rice.  Limit how much alcohol and caffeine you drink.  Increase your fiber intake. Foods such as fresh fruits, vegetables, beans, and whole grains are healthy sources of fiber.  Pelvic floor muscle exercises.  Bladder training, such as lengthening the amount of time between bathroom breaks, or using the bathroom at regular intervals.  Using techniques to suppress bladder urges. This can include distraction techniques or controlled breathing exercises.  Medicines to relax the bladder muscles and prevent bladder spasms.  Medicines to help slow or prevent the growth of a man's prostate.  Botox injections. These can help relax the bladder muscles.  Using pulses of electricity to help change bladder reflexes (electrical nerve stimulation).  For women, using a medical device to prevent urine leaks. This is a small, tampon-like, disposable device that is inserted into the urethra.  Injecting collagen or carbon beads (bulking agents) into the urinary sphincter. These can help thicken tissue and close the bladder opening.  Surgery. Follow these instructions at home: Lifestyle  Limit alcohol  and caffeine. These can fill your bladder quickly and irritate it.  Keep yourself clean to help prevent odors and skin damage. Ask your doctor about special skin creams and cleansers that can protect the skin from urine.  Consider wearing pads or adult diapers. Make sure to change them regularly, and always change them right after experiencing incontinence. General instructions  Take over-the-counter and prescription medicines only as told by your health care provider.  Use the bathroom about every 3-4 hours, even if you do not feel the need to urinate. Try to empty your bladder completely every time. After urinating, wait a minute. Then try to urinate again.  Make sure you are in a relaxed position while urinating.  If your  incontinence is caused by nerve problems, keep a log of the medicines you take and the times you go to the bathroom.  Keep all follow-up visits as told by your health care provider. This is important. Contact a health care provider if:  You have pain that gets worse.  Your incontinence gets worse. Get help right away if:  You have a fever or chills.  You are unable to urinate.  You have redness in your groin area or down your legs. Summary  Urinary incontinence refers to a condition in which a person is unable to control where and when to pass urine.  This condition may be caused by medicines, infection, weak bladder muscles, weak pelvic floor muscles, enlargement of the prostate (in men), or surgery.  The following factors increase your risk for developing this condition: older age, obesity, pregnancy and childbirth, menopause, neurological diseases, and chronic coughing.  There are several types of urinary incontinence. They include urge incontinence, stress incontinence, overflow incontinence, and functional incontinence.  This condition is usually treated first with lifestyle and behavioral changes, such as quitting smoking, eating a healthier diet, and doing regular pelvic floor exercises. Other treatment options include medicines, bulking agents, medical devices, electrical nerve stimulation, or surgery. This information is not intended to replace advice given to you by your health care provider. Make sure you discuss any questions you have with your health care provider. Document Released: 05/23/2004 Document Revised: 04/25/2017 Document Reviewed: 07/25/2016 Elsevier Patient Education  2020 [employer].

## 2018-12-29 NOTE — Progress Notes (Signed)
Pt present for annual exam. Pt stated that she is doing well no problems.  

## 2019-01-01 ENCOUNTER — Encounter: Payer: Self-pay | Admitting: Nurse Practitioner

## 2019-01-01 ENCOUNTER — Other Ambulatory Visit: Payer: Self-pay

## 2019-01-01 ENCOUNTER — Ambulatory Visit (INDEPENDENT_AMBULATORY_CARE_PROVIDER_SITE_OTHER): Payer: Medicare Other | Admitting: Nurse Practitioner

## 2019-01-01 VITALS — BP 130/77 | HR 64 | Resp 16 | Ht 65.0 in | Wt 182.0 lb

## 2019-01-01 DIAGNOSIS — Z13820 Encounter for screening for osteoporosis: Secondary | ICD-10-CM

## 2019-01-01 DIAGNOSIS — N390 Urinary tract infection, site not specified: Secondary | ICD-10-CM | POA: Diagnosis not present

## 2019-01-01 DIAGNOSIS — R300 Dysuria: Secondary | ICD-10-CM

## 2019-01-01 DIAGNOSIS — F411 Generalized anxiety disorder: Secondary | ICD-10-CM

## 2019-01-01 DIAGNOSIS — Z0001 Encounter for general adult medical examination with abnormal findings: Secondary | ICD-10-CM

## 2019-01-01 DIAGNOSIS — Z1239 Encounter for other screening for malignant neoplasm of breast: Secondary | ICD-10-CM

## 2019-01-01 DIAGNOSIS — I10 Essential (primary) hypertension: Secondary | ICD-10-CM | POA: Diagnosis not present

## 2019-01-01 NOTE — Progress Notes (Signed)
[facility] [place], [place] [address]  Internal MEDICINE  Office Visit Note  Patient Name: Amanda Duke  [id number]  [id number]  Date of Service: 01/13/2019   Pt is here for routine health maintenance examination  Chief Complaint  Patient presents with  . Medical Management of Chronic Issues  . Annual Exam    annual wellness visit   . Hypertension  . Hyperlipidemia     The patient is here for health maintenance exam. She has hypertension and it is well controlled. She does see cardiology. Recently added Coreg 3.125mg  twice daily. Blood pressure is improved and she has had no negative side effects.  She does see Dr. Marcelline Mates, [place], at [facility]. She had breast and pelvic exam at that time. She is due to have screening mammogram in 01/2019. She has had both pneumonia vaccines. She has no new concerns or complaints today.      Current Medication: Outpatient Encounter Medications as of 01/01/2019  Medication Sig  . carvedilol (COREG) 3.125 MG tablet Take 3.125 mg by mouth 2 (two) times daily with a meal.   . cholecalciferol (VITAMIN D) 1000 units tablet Take 1,000 Units by mouth daily.  . clonazePAM (KLONOPIN) 0.5 MG tablet TAKE 1 TABLET BY MOUTH AT BEDTIME AS NEEDED INSOMNIA/ANXIETY  . diltiazem (CARDIZEM CD) 120 MG 24 hr capsule Take 120 mg by mouth daily.   . fluticasone (FLONASE) 50 MCG/ACT nasal spray Place 2 sprays into the nose as needed.   Vanessa Kick Ethyl (VASCEPA) 1 g CAPS Take 1 g by mouth 2 (two) times daily.   Marland Kitchen linaclotide (LINZESS) 290 MCG CAPS capsule Take 1 capsule (290 mcg total) by mouth daily.  Marland Kitchen losartan (COZAAR) 100 MG tablet Take 100 mg by mouth daily.  . ondansetron (ZOFRAN) 4 MG tablet Take 1 tablet (4 mg total) by mouth every 8 (eight) hours as needed for nausea or vomiting.  . pantoprazole (PROTONIX) 40 MG tablet Take 40 mg by mouth daily.   . rosuvastatin (CRESTOR) 20 MG tablet Take 1 tablet (20 mg total) by mouth daily.  .  [DISCONTINUED] benzonatate (TESSALON) 200 MG capsule Take 1 capsule (200 mg total) by mouth 2 (two) times daily as needed for cough.  . [DISCONTINUED] cefdinir (OMNICEF) 300 MG capsule Take 1 capsule (300 mg total) by mouth 2 (two) times daily.  . [DISCONTINUED] phenazopyridine (PYRIDIUM) 200 MG tablet Take 1 tablet (200 mg total) by mouth 3 (three) times daily as needed for pain.  . [DISCONTINUED] carvedilol (COREG CR) 10 MG 24 hr capsule Take 10 mg by mouth daily.  . [DISCONTINUED] nitrofurantoin, macrocrystal-monohydrate, (MACROBID) 100 MG capsule Take 1 capsule (100 mg total) by mouth 2 (two) times daily.   No facility-administered encounter medications on file as of 01/01/2019.     Surgical History: Past Surgical History:  Procedure Laterality Date  . APPENDECTOMY  1971  . ARTHRODESIS  1980  . BACK SURGERY  1980  . COLONOSCOPY WITH PROPOFOL N/A 12/12/2016   Procedure: COLONOSCOPY WITH PROPOFOL;  Surgeon: Lollie Sails, MD;  Location: [facility] ENDOSCOPY;  Service: Endoscopy;  Laterality: N/A;  . ESOPHAGOGASTRODUODENOSCOPY (EGD) WITH PROPOFOL N/A 12/12/2016   Procedure: ESOPHAGOGASTRODUODENOSCOPY (EGD) WITH PROPOFOL;  Surgeon: Lollie Sails, MD;  Location: [facility] ENDOSCOPY;  Service: Endoscopy;  Laterality: N/A;  . [place]   right  . TONSILLECTOMY  1948  . TUBAL LIGATION  1971  . VAGINAL HYSTERECTOMY  1985   d/t heavy bleeding  Medical History: Past Medical History:  Diagnosis Date  . Anxiety   . Coronary artery disease   . GERD (gastroesophageal reflux disease)   . Hyperlipemia   . Hypertension   . Irregular heart beat   . Pre-diabetes   . Prediabetes   . Renal cyst    bilateral    Family History: Family History  Problem Relation Age of Onset  . Heart failure Mother   . Stroke Mother   . Breast cancer Mother        dx at age 31 yo  . Diabetes Mother   . Osteoporosis Mother   . Emphysema Father   . Ovarian cancer  Sister        dx at age 9 yo  . Asthma Sister       Review of Systems  Constitutional: Negative for chills, fatigue and unexpected weight change.  HENT: Negative for congestion, postnasal drip, rhinorrhea, sneezing and sore throat.   Respiratory: Negative for cough, chest tightness, shortness of breath and wheezing.   Cardiovascular: Negative for chest pain and palpitations.  Gastrointestinal: Negative for abdominal pain, constipation, diarrhea, nausea and vomiting.  Endocrine: Negative for cold intolerance, heat intolerance, polydipsia and polyuria.  Genitourinary: Negative for dysuria, flank pain, frequency and urgency.  Musculoskeletal: Negative for arthralgias, back pain, joint swelling and neck pain.  Skin: Negative for rash.  Allergic/Immunologic: Negative for environmental allergies.  Neurological: Positive for headaches. Negative for tremors and numbness.  Hematological: Negative for adenopathy. Does not bruise/bleed easily.  Psychiatric/Behavioral: Negative for behavioral problems (Depression), sleep disturbance and suicidal ideas. The patient is not nervous/anxious.     Today's Vitals   01/01/19 1045  BP: 130/77  Pulse: 64  Resp: 16  SpO2: 95%  Weight: 182 lb (82.6 kg)  Height: 5\' 5"  (1.651 m)   Body mass index is 30.29 kg/m.  Physical Exam Vitals signs and nursing note reviewed.  Constitutional:      General: She is not in acute distress.    Appearance: Normal appearance. She is well-developed. She is not diaphoretic.  HENT:     Head: Normocephalic and atraumatic.     Nose: Nose normal.     Mouth/Throat:     Pharynx: No oropharyngeal exudate.  Eyes:     Conjunctiva/sclera: Conjunctivae normal.     Pupils: Pupils are equal, round, and reactive to light.  Neck:     Musculoskeletal: Normal range of motion and neck supple.     Thyroid: No thyromegaly.     Vascular: No carotid bruit or JVD.     Trachea: No tracheal deviation.  Cardiovascular:     Rate and  Rhythm: Normal rate and regular rhythm.     Pulses: Normal pulses.     Heart sounds: Normal heart sounds. No murmur. No friction rub. No gallop.   Pulmonary:     Effort: Pulmonary effort is normal. No respiratory distress.     Breath sounds: Normal breath sounds. No wheezing or rales.  Chest:     Chest wall: No tenderness.  Abdominal:     General: Bowel sounds are normal.     Palpations: Abdomen is soft.     Tenderness: There is no abdominal tenderness.  Musculoskeletal: Normal range of motion.  Lymphadenopathy:     Cervical: No cervical adenopathy.  Skin:    General: Skin is warm and dry.  Neurological:     General: No focal deficit present.     Mental Status: She is alert and oriented  to person, place, and time.     Cranial Nerves: No cranial nerve deficit.  Psychiatric:        Behavior: Behavior normal.        Thought Content: Thought content normal.        Judgment: Judgment normal.    Depression screen [facility] 2/9 01/01/2019 09/28/2018 06/11/2018 04/16/2018 12/15/2017  Decreased Interest 0 0 0 0 0  Down, Depressed, Hopeless 0 0 0 0 0  PHQ - 2 Score 0 0 0 0 0    Functional Status Survey: Is the patient deaf or have difficulty hearing?: Yes Does the patient have difficulty seeing, even when wearing glasses/contacts?: No Does the patient have difficulty concentrating, remembering, or making decisions?: No Does the patient have difficulty walking or climbing stairs?: No Does the patient have difficulty dressing or bathing?: No Does the patient have difficulty doing errands alone such as visiting a doctor's office or shopping?: No  MMSE - [place] Exam 01/01/2019 12/15/2017  Orientation to time 5 5  Orientation to Place 5 5  Registration 3 3  Attention/ Calculation 5 5  Recall 3 3  Language- name 2 objects 2 2  Language- repeat 1 1  Language- follow 3 step command 3 3  Language- read & follow direction 1 1  Write a sentence 1 1  Copy design 1 1  Total score 30 30     Fall Risk  01/01/2019 09/28/2018 06/11/2018 04/16/2018 12/15/2017  Falls in the past year? 0 0 0 0 No  Number falls in past yr: 0 - - - -  Injury with Fall? 0 - - - -      LABS: Recent Results (from the past 2160 hour(s))  UA/M w/rflx Culture, Routine     Status: Abnormal   Collection Time: 01/01/19 10:53 AM   Specimen: Urine   URINE  Result Value Ref Range   Specific Gravity, UA 1.014 1.005 - 1.030   pH, UA 6.0 5.0 - 7.5   Color, UA Yellow Yellow   Appearance Ur Clear Clear   Leukocytes,UA 3+ (A) Negative   Protein,UA Negative Negative/Trace   Glucose, UA Negative Negative   Ketones, UA Negative Negative   RBC, UA Negative Negative   Bilirubin, UA Negative Negative   Urobilinogen, Ur 0.2 0.2 - 1.0 mg/dL   Nitrite, UA Negative Negative   Microscopic Examination See below:     Comment: Microscopic was indicated and was performed.   Urinalysis Reflex Comment     Comment: This specimen has reflexed to a Urine Culture.  Microscopic Examination     Status: Abnormal   Collection Time: 01/01/19 10:53 AM   URINE  Result Value Ref Range   WBC, UA 6-10 (A) 0 - 5 /hpf   RBC 0-2 0 - 2 /hpf   Epithelial Cells (non renal) 0-10 0 - 10 /hpf   Casts None seen None seen /lpf   Mucus, UA Present Not Estab.   Bacteria, UA Few None seen/Few  Urine Culture, Reflex     Status: None   Collection Time: 01/01/19 10:53 AM   URINE  Result Value Ref Range   Urine Culture, Routine Final report    Organism ID, Bacteria Comment     Comment: Mixed urogenital flora 10,000-25,000 colony forming units per mL   CBC with Differential/Platelet     Status: Abnormal   Collection Time: 01/10/19  6:17 AM  Result Value Ref Range   WBC 12.7 (H) 4.0 - 10.5 K/uL  RBC 4.62 3.87 - 5.11 MIL/uL   Hemoglobin 13.5 12.0 - 15.0 g/dL   HCT 39.7 36.0 - 46.0 %   MCV 85.9 80.0 - 100.0 fL   MCH 29.2 26.0 - 34.0 pg   MCHC 34.0 30.0 - 36.0 g/dL   RDW 14.6 11.5 - 15.5 %   Platelets 234 150 - 400 K/uL   nRBC 0.0 0.0  - 0.2 %   Neutrophils Relative % 91 %   Neutro Abs 11.5 (H) 1.7 - 7.7 K/uL   Lymphocytes Relative 2 %   Lymphs Abs 0.3 (L) 0.7 - 4.0 K/uL   Monocytes Relative 6 %   Monocytes Absolute 0.8 0.1 - 1.0 K/uL   Eosinophils Relative 0 %   Eosinophils Absolute 0.0 0.0 - 0.5 K/uL   Basophils Relative 0 %   Basophils Absolute 0.0 0.0 - 0.1 K/uL   Immature Granulocytes 1 %   Abs Immature Granulocytes 0.07 0.00 - 0.07 K/uL    Comment: Performed at [facility], [address] [place], [place] [address]  Comprehensive metabolic panel     Status: Abnormal   Collection Time: 01/10/19  6:17 AM  Result Value Ref Range   Sodium 138 135 - 145 mmol/L   Potassium 3.4 (L) 3.5 - 5.1 mmol/L   Chloride 103 98 - 111 mmol/L   CO2 23 22 - 32 mmol/L   Glucose, Bld 138 (H) 70 - 99 mg/dL   BUN 11 8 - 23 mg/dL   Creatinine, Ser 1.04 (H) 0.44 - 1.00 mg/dL   Calcium 9.1 8.9 - 10.3 mg/dL   Total Protein 7.4 6.5 - 8.1 g/dL   Albumin 4.1 3.5 - 5.0 g/dL   AST 42 (H) 15 - 41 U/L   ALT 21 0 - 44 U/L   Alkaline Phosphatase 86 38 - 126 U/L   Total Bilirubin 0.9 0.3 - 1.2 mg/dL   GFR calc non Af Amer 52 (L) >60 mL/min   GFR calc Af Amer >60 >60 mL/min   Anion gap 12 5 - 15    Comment: Performed at [facility], [address] [place], [place] [address]  Lipase, blood     Status: None   Collection Time: 01/10/19  6:17 AM  Result Value Ref Range   Lipase 37 11 - 51 U/L    Comment: Performed at [facility], [address] [place], [place] [address]  Magnesium     Status: None   Collection Time: 01/10/19  6:17 AM  Result Value Ref Range   Magnesium 1.7 1.7 - 2.4 mg/dL    Comment: Performed at [facility], [place], [place] [address]  Procalcitonin     Status: None   Collection Time: 01/10/19  6:17 AM  Result Value Ref Range   Procalcitonin 0.73 ng/mL    Comment:        Interpretation: PCT > 0.5 ng/mL and <= 2 ng/mL: Systemic infection (sepsis) is  possible, but other conditions are known to elevate PCT as well. (NOTE)       Sepsis PCT Algorithm           Lower Respiratory Tract                                      Infection PCT Algorithm    ----------------------------     ----------------------------  PCT < 0.25 ng/mL                PCT < 0.10 ng/mL         Strongly encourage             Strongly discourage   discontinuation of antibiotics    initiation of antibiotics    ----------------------------     -----------------------------       PCT 0.25 - 0.50 ng/mL            PCT 0.10 - 0.25 ng/mL               OR       >80% decrease in PCT            Discourage initiation of                                            antibiotics      Encourage discontinuation           of antibiotics    ----------------------------     -----------------------------         PCT >= 0.50 ng/mL              PCT 0.26 - 0.50 ng/mL                AND       <80% decrease in PCT             Encourage initiation of                                             antibiotics       Encourage continuation           of antibiotics    ----------------------------     -----------------------------        PCT >= 0.50 ng/mL                  PCT > 0.50 ng/mL               AND         increase in PCT                  Strongly encourage                                      initiation of antibiotics    Strongly encourage escalation           of antibiotics                                     -----------------------------                                           PCT <= 0.25 ng/mL  OR                                        > 80% decrease in PCT                                     Discontinue / Do not initiate                                             antibiotics Performed at [facility], [place], [place] [address]   Troponin I (High Sensitivity)     Status: None   Collection Time:  01/10/19  6:17 AM  Result Value Ref Range   Troponin I (High Sensitivity) 6 <18 ng/L    Comment: (NOTE) Elevated high sensitivity troponin I (hsTnI) values and significant  changes across serial measurements may suggest ACS but many other  chronic and acute conditions are known to elevate hsTnI results.  Refer to the "Links" section for chest pain algorithms and additional  guidance. Performed at [facility], [address] [place], [place] [address]   Lactic acid, plasma     Status: Abnormal   Collection Time: 01/10/19  6:36 AM  Result Value Ref Range   Lactic Acid, Venous 2.6 (HH) 0.5 - 1.9 mmol/L    Comment: CRITICAL RESULT CALLED TO, READ BACK BY AND VERIFIED WITH ARIEL WALLACE AT [id number] 01/10/2019.PMF Performed at [facility], [address] [place], [place] [address]   Blood Culture (routine x 2)     Status: None (Preliminary result)   Collection Time: 01/10/19  6:36 AM   Specimen: BLOOD  Result Value Ref Range   Specimen Description BLOOD LEFT ANTECUBITAL    Special Requests      BOTTLES DRAWN AEROBIC AND ANAEROBIC Blood Culture adequate volume   Culture      NO GROWTH 3 DAYS Performed at [facility], [address], [place], [place] [address]    Report Status PENDING   Blood Culture (routine x 2)     Status: None (Preliminary result)   Collection Time: 01/10/19  7:38 AM   Specimen: BLOOD  Result Value Ref Range   Specimen Description BLOOD RAC    Special Requests      BOTTLES DRAWN AEROBIC AND ANAEROBIC Blood Culture results may not be optimal due to an excessive volume of blood received in culture bottles   Culture      NO GROWTH 3 DAYS Performed at [facility], [address], [place],  [address]    Report Status PENDING   SARS Coronavirus 2 [facility] order, Performed in [facility] hospital lab) Nasopharyngeal Nasopharyngeal Swab     Status: None   Collection Time: 01/10/19  7:44 AM   Specimen: Nasopharyngeal  Swab  Result Value Ref Range   SARS Coronavirus 2 NEGATIVE NEGATIVE    Comment: (NOTE) If result is NEGATIVE SARS-CoV-2 target nucleic acids are NOT DETECTED. The SARS-CoV-2 RNA is generally detectable in upper and lower  respiratory specimens during the acute phase of infection. The lowest  concentration of SARS-CoV-2 viral copies this assay can detect is 250  copies / mL. A negative result does  not preclude SARS-CoV-2 infection  and should not be used as the sole basis for treatment or other  patient management decisions.  A negative result may occur with  improper specimen collection / handling, submission of specimen other  than nasopharyngeal swab, presence of viral mutation(s) within the  areas targeted by this assay, and inadequate number of viral copies  (<250 copies / mL). A negative result must be combined with clinical  observations, patient history, and epidemiological information. If result is POSITIVE SARS-CoV-2 target nucleic acids are DETECTED. The SARS-CoV-2 RNA is generally detectable in upper and lower  respiratory specimens dur ing the acute phase of infection.  Positive  results are indicative of active infection with SARS-CoV-2.  Clinical  correlation with patient history and other diagnostic information is  necessary to determine patient infection status.  Positive results do  not rule out bacterial infection or co-infection with other viruses. If result is PRESUMPTIVE POSTIVE SARS-CoV-2 nucleic acids MAY BE PRESENT.   A presumptive positive result was obtained on the submitted specimen  and confirmed on repeat testing.  While 2019 novel coronavirus  (SARS-CoV-2) nucleic acids may be present in the submitted sample  additional confirmatory testing may be necessary for epidemiological  and / or clinical management purposes  to differentiate between  SARS-CoV-2 and other Sarbecovirus currently known to infect humans.  If clinically indicated additional testing  with an alternate test  methodology [phone]) is advised. The SARS-CoV-2 RNA is generally  detectable in upper and lower respiratory sp ecimens during the acute  phase of infection. The expected result is Negative. Fact Sheet for Patients:  StrictlyIdeas.no Fact Sheet for Healthcare Providers: BankingDealers.co.za This test is not yet approved or cleared by the [place] FDA and has been authorized for detection and/or diagnosis of SARS-CoV-2 by FDA under an Emergency Use Authorization (EUA).  This EUA will remain in effect (meaning this test can be used) for the duration of the COVID-19 declaration under Section 564(b)(1) of the Act, 21 U.S.C. section 360bbb-3(b)(1), unless the authorization is terminated or revoked sooner. Performed at [facility], [address] [place], [place] [address]   Urinalysis, Complete w Microscopic     Status: Abnormal   Collection Time: 01/10/19  8:22 AM  Result Value Ref Range   Color, Urine YELLOW (A) YELLOW   APPearance CLEAR (A) CLEAR   Specific Gravity, Urine 1.011 1.005 - 1.030   pH 7.0 5.0 - 8.0   Glucose, UA NEGATIVE NEGATIVE mg/dL   Hgb urine dipstick NEGATIVE NEGATIVE   Bilirubin Urine NEGATIVE NEGATIVE   Ketones, ur NEGATIVE NEGATIVE mg/dL   Protein, ur NEGATIVE NEGATIVE mg/dL   Nitrite NEGATIVE NEGATIVE   Leukocytes,Ua NEGATIVE NEGATIVE   RBC / HPF 0-5 0 - 5 RBC/hpf   WBC, UA 0-5 0 - 5 WBC/hpf   Bacteria, UA NONE SEEN NONE SEEN   Squamous Epithelial / LPF 0-5 0 - 5    Comment: Performed at [facility], [address], [place],  [address]  Urine Culture     Status: None   Collection Time: 01/10/19  8:22 AM   Specimen: Urine, Clean Catch  Result Value Ref Range   Specimen Description      URINE, CLEAN CATCH Performed at [facility], [address], [place],  [address]    Special Requests      Normal Performed at [facility], [address], [place],  [address]    Culture  NO GROWTH Performed at [facility], [place] [address], [place], [place] [address]    Report Status 01/11/2019 FINAL   Lactic acid, plasma     Status: Abnormal   Collection Time: 01/10/19 11:28 AM  Result Value Ref Range   Lactic Acid, Venous 3.4 (HH) 0.5 - 1.9 mmol/L    Comment: CRITICAL RESULT CALLED TO, READ BACK BY AND VERIFIED WITH JACK DOUGHERTY AT 1200 01/10/2019.PMF Performed at [facility], [address] [place], [place] [address]   Gastrointestinal Panel by PCR , Stool     Status: None   Collection Time: 01/10/19  9:21 PM   Specimen: Stool  Result Value Ref Range   Campylobacter species NOT DETECTED NOT DETECTED   Plesimonas shigelloides NOT DETECTED NOT DETECTED   Salmonella species NOT DETECTED NOT DETECTED   Yersinia enterocolitica NOT DETECTED NOT DETECTED   Vibrio species NOT DETECTED NOT DETECTED   Vibrio cholerae NOT DETECTED NOT DETECTED   Enteroaggregative E coli (EAEC) NOT DETECTED NOT DETECTED   Enteropathogenic E coli (EPEC) NOT DETECTED NOT DETECTED   Enterotoxigenic E coli (ETEC) NOT DETECTED NOT DETECTED   Shiga like toxin producing E coli (STEC) NOT DETECTED NOT DETECTED   Shigella/Enteroinvasive E coli (EIEC) NOT DETECTED NOT DETECTED   Cryptosporidium NOT DETECTED NOT DETECTED   Cyclospora cayetanensis NOT DETECTED NOT DETECTED   Entamoeba histolytica NOT DETECTED NOT DETECTED   Giardia lamblia NOT DETECTED NOT DETECTED   Adenovirus F40/41 NOT DETECTED NOT DETECTED   Astrovirus NOT DETECTED NOT DETECTED   Norovirus GI/GII NOT DETECTED NOT DETECTED   Rotavirus A NOT DETECTED NOT DETECTED   Sapovirus (I, II, IV, and V) NOT DETECTED NOT DETECTED    Comment: Performed at [facility], [address] [place], [place] [id number]  Basic metabolic panel     Status: Abnormal   Collection Time: 01/11/19  5:17 AM  Result Value Ref Range   Sodium 137 135 - 145 mmol/L    Potassium 3.4 (L) 3.5 - 5.1 mmol/L   Chloride 108 98 - 111 mmol/L   CO2 22 22 - 32 mmol/L   Glucose, Bld 103 (H) 70 - 99 mg/dL   BUN 12 8 - 23 mg/dL   Creatinine, Ser 1.04 (H) 0.44 - 1.00 mg/dL   Calcium 8.2 (L) 8.9 - 10.3 mg/dL   GFR calc non Af Amer 52 (L) >60 mL/min   GFR calc Af Amer >60 >60 mL/min   Anion gap 7 5 - 15    Comment: Performed at [facility], [address] [place], [place] [address]  CBC     Status: Abnormal   Collection Time: 01/11/19  5:17 AM  Result Value Ref Range   WBC 8.6 4.0 - 10.5 K/uL   RBC 3.79 (L) 3.87 - 5.11 MIL/uL   Hemoglobin 10.9 (L) 12.0 - 15.0 g/dL   HCT 32.9 (L) 36.0 - 46.0 %   MCV 86.8 80.0 - 100.0 fL   MCH 28.8 26.0 - 34.0 pg   MCHC 33.1 30.0 - 36.0 g/dL   RDW 15.0 11.5 - 15.5 %   Platelets 189 150 - 400 K/uL   nRBC 0.0 0.0 - 0.2 %    Comment: Performed at [facility], [address] [place], [place] [address]  Lactic acid, plasma     Status: None   Collection Time: 01/11/19  7:50 AM  Result Value Ref Range   Lactic Acid, Venous 1.2 0.5 - 1.9 mmol/L  Comment: Performed at [facility], [address] [place], [place] [address]  Procalcitonin - Baseline     Status: None   Collection Time: 01/11/19  7:50 AM  Result Value Ref Range   Procalcitonin 3.53 ng/mL    Comment:        Interpretation: PCT > 2 ng/mL: Systemic infection (sepsis) is likely, unless other causes are known. (NOTE)       Sepsis PCT Algorithm           Lower Respiratory Tract                                      Infection PCT Algorithm    ----------------------------     ----------------------------         PCT < 0.25 ng/mL                PCT < 0.10 ng/mL         Strongly encourage             Strongly discourage   discontinuation of antibiotics    initiation of antibiotics    ----------------------------     -----------------------------       PCT 0.25 - 0.50 ng/mL            PCT 0.10 - 0.25 ng/mL               OR       >80%  decrease in PCT            Discourage initiation of                                            antibiotics      Encourage discontinuation           of antibiotics    ----------------------------     -----------------------------         PCT >= 0.50 ng/mL              PCT 0.26 - 0.50 ng/mL               AND       <80% decrease in PCT              Encourage initiation of                                             antibiotics       Encourage continuation           of antibiotics    ----------------------------     -----------------------------        PCT >= 0.50 ng/mL                  PCT > 0.50 ng/mL               AND         increase in PCT                  Strongly encourage  initiation of antibiotics    Strongly encourage escalation           of antibiotics                                     -----------------------------                                           PCT <= 0.25 ng/mL                                                 OR                                        > 80% decrease in PCT                                     Discontinue / Do not initiate                                             antibiotics Performed at [facility], [address] [place], [place] [address]   CBC     Status: Abnormal   Collection Time: 01/12/19  4:25 AM  Result Value Ref Range   WBC 5.8 4.0 - 10.5 K/uL   RBC 3.87 3.87 - 5.11 MIL/uL   Hemoglobin 11.2 (L) 12.0 - 15.0 g/dL   HCT 34.1 (L) 36.0 - 46.0 %   MCV 88.1 80.0 - 100.0 fL   MCH 28.9 26.0 - 34.0 pg   MCHC 32.8 30.0 - 36.0 g/dL   RDW 15.1 11.5 - 15.5 %   Platelets 194 150 - 400 K/uL   nRBC 0.0 0.0 - 0.2 %    Comment: Performed at [facility], [address] [place], [place] [id number]  Basic metabolic panel     Status: Abnormal   Collection Time: 01/12/19  4:25 AM  Result Value Ref Range   Sodium 141 135 - 145 mmol/L   Potassium 4.1 3.5 - 5.1 mmol/L   Chloride 111 98 - 111  mmol/L   CO2 23 22 - 32 mmol/L   Glucose, Bld 95 70 - 99 mg/dL   BUN 10 8 - 23 mg/dL   Creatinine, Ser 0.88 0.44 - 1.00 mg/dL   Calcium 8.4 (L) 8.9 - 10.3 mg/dL   GFR calc non Af Amer >60 >60 mL/min   GFR calc Af Amer >60 >60 mL/min   Anion gap 7 5 - 15    Comment: Performed at [facility], [place], [place] [address]  Procalcitonin - Baseline     Status: None   Collection Time: 01/12/19  4:25 AM  Result Value Ref Range   Procalcitonin 1.87 ng/mL    Comment:        Interpretation: PCT > 0.5 ng/mL and <= 2 ng/mL: Systemic infection (sepsis)  is possible, but other conditions are known to elevate PCT as well. (NOTE)       Sepsis PCT Algorithm           Lower Respiratory Tract                                      Infection PCT Algorithm    ----------------------------     ----------------------------         PCT < 0.25 ng/mL                PCT < 0.10 ng/mL         Strongly encourage             Strongly discourage   discontinuation of antibiotics    initiation of antibiotics    ----------------------------     -----------------------------       PCT 0.25 - 0.50 ng/mL            PCT 0.10 - 0.25 ng/mL               OR       >80% decrease in PCT            Discourage initiation of                                            antibiotics      Encourage discontinuation           of antibiotics    ----------------------------     -----------------------------         PCT >= 0.50 ng/mL              PCT 0.26 - 0.50 ng/mL                AND       <80% decrease in PCT             Encourage initiation of                                             antibiotics       Encourage continuation           of antibiotics    ----------------------------     -----------------------------        PCT >= 0.50 ng/mL                  PCT > 0.50 ng/mL               AND         increase in PCT                  Strongly encourage                                      initiation of  antibiotics    Strongly encourage escalation           of antibiotics                                     -----------------------------  PCT <= 0.25 ng/mL                                                 OR                                        > 80% decrease in PCT                                     Discontinue / Do not initiate                                             antibiotics Performed at [facility], [address] [place], [place] [address]    Assessment/Plan:  1. Encounter for general adult medical examination with abnormal findings Annual health maintenance exam today  2. Benign essential HTN Stable. Continue bp medication as prescribed. Regular visits with cardiology as scheduled   3. Urinary tract infection without hematuria, site unspecified Treat with macrobid 100mg  bid.   4. Generalized anxiety disorder May continue clonazepam 0.5mg  as needed and as prescribed   5. Screening for breast cancer - MM DIGITAL SCREENING BILATERAL; Future  6. Screening for osteoporosis - DG Bone Density; Future  7. Dysuria - UA/M w/rflx Culture, Routine  General Counseling: Cyr[place]al verbalizes understanding of the findings of todays visit and agrees with plan of treatment. I have discussed any further diagnostic evaluation that may be needed or ordered today. We also reviewed her medications today. she has been encouraged to call the office with any questions or concerns that should arise related to todays visit.    Counseling:  This patient was seen by Leretha Pol FNP Collaboration with Dr Lavera Guise as a part of collaborative care agreement  Orders Placed This Encounter  Procedures  . Microscopic Examination  . Urine Culture, Reflex  . MM DIGITAL SCREENING BILATERAL  . DG Bone Density  . UA/M w/rflx Culture, Routine    Meds ordered this encounter  Medications  . DISCONTD: nitrofurantoin,  macrocrystal-monohydrate, (MACROBID) 100 MG capsule    Sig: Take 1 capsule (100 mg total) by mouth 2 (two) times daily.    Dispense:  10 capsule    Refill:  0    Order Specific Question:   Supervising Provider    Answer:   Lavera Guise [[id number]]    Time spent: [place], MD  Internal Medicine

## 2019-01-04 LAB — MICROSCOPIC EXAMINATION: Casts: NONE SEEN /lpf

## 2019-01-04 LAB — UA/M W/RFLX CULTURE, ROUTINE
Bilirubin, UA: NEGATIVE
Glucose, UA: NEGATIVE
Ketones, UA: NEGATIVE
Nitrite, UA: NEGATIVE
Protein,UA: NEGATIVE
RBC, UA: NEGATIVE
Specific Gravity, UA: 1.014 (ref 1.005–1.030)
Urobilinogen, Ur: 0.2 mg/dL (ref 0.2–1.0)
pH, UA: 6 (ref 5.0–7.5)

## 2019-01-04 LAB — URINE CULTURE, REFLEX

## 2019-01-06 ENCOUNTER — Telehealth: Payer: Self-pay

## 2019-01-06 MED ORDER — NITROFURANTOIN MONOHYD MACRO 100 MG PO CAPS: 100.0000 mg | ORAL_CAPSULE | Freq: Two times a day (BID) | ORAL | 0 refills | Status: DC

## 2019-01-06 NOTE — Progress Notes (Signed)
Please let the patient know that urine from physical indicated mild urinary tract infection. I added macrobid twice daily for 5 days and sent to her pharmacy. Thanks.

## 2019-01-06 NOTE — Telephone Encounter (Signed)
Per Nira Conn I let the patient know that urine from physical indicated mild urinary tract infection heather added macrobid twice daily for 5 days and sent to her pharmacy

## 2019-01-10 ENCOUNTER — Emergency Department: Payer: Medicare Other

## 2019-01-10 ENCOUNTER — Inpatient Hospital Stay: Payer: Medicare Other

## 2019-01-10 ENCOUNTER — Inpatient Hospital Stay
Admission: EM | Admit: 2019-01-10 | Discharge: 2019-01-12 | DRG: 872 | Disposition: A | Discharge status: Home / Self Care | Payer: Medicare Other | Attending: Internal Medicine | Admitting: Internal Medicine

## 2019-01-10 ENCOUNTER — Other Ambulatory Visit: Payer: Self-pay

## 2019-01-10 DIAGNOSIS — Z8262 Family history of osteoporosis: Secondary | ICD-10-CM | POA: Diagnosis not present

## 2019-01-10 DIAGNOSIS — N390 Urinary tract infection, site not specified: Secondary | ICD-10-CM | POA: Diagnosis not present

## 2019-01-10 DIAGNOSIS — K219 Gastro-esophageal reflux disease without esophagitis: Secondary | ICD-10-CM | POA: Diagnosis not present

## 2019-01-10 DIAGNOSIS — I129 Hypertensive chronic kidney disease with stage 1 through stage 4 chronic kidney disease, or unspecified chronic kidney disease: Secondary | ICD-10-CM | POA: Diagnosis present

## 2019-01-10 DIAGNOSIS — Z881 Allergy status to other antibiotic agents status: Secondary | ICD-10-CM

## 2019-01-10 DIAGNOSIS — Z888 Allergy status to other drugs, medicaments and biological substances status: Secondary | ICD-10-CM

## 2019-01-10 DIAGNOSIS — Z833 Family history of diabetes mellitus: Secondary | ICD-10-CM

## 2019-01-10 DIAGNOSIS — E559 Vitamin D deficiency, unspecified: Secondary | ICD-10-CM | POA: Diagnosis present

## 2019-01-10 DIAGNOSIS — Z882 Allergy status to sulfonamides status: Secondary | ICD-10-CM

## 2019-01-10 DIAGNOSIS — E785 Hyperlipidemia, unspecified: Secondary | ICD-10-CM | POA: Diagnosis not present

## 2019-01-10 DIAGNOSIS — R197 Diarrhea, unspecified: Secondary | ICD-10-CM | POA: Diagnosis not present

## 2019-01-10 DIAGNOSIS — N183 Chronic kidney disease, stage 3 (moderate): Secondary | ICD-10-CM | POA: Diagnosis present

## 2019-01-10 DIAGNOSIS — Z87891 Personal history of nicotine dependence: Secondary | ICD-10-CM

## 2019-01-10 DIAGNOSIS — A084 Viral intestinal infection, unspecified: Secondary | ICD-10-CM | POA: Diagnosis present

## 2019-01-10 DIAGNOSIS — G4700 Insomnia, unspecified: Secondary | ICD-10-CM | POA: Diagnosis present

## 2019-01-10 DIAGNOSIS — Z23 Encounter for immunization: Secondary | ICD-10-CM | POA: Diagnosis present

## 2019-01-10 DIAGNOSIS — K529 Noninfective gastroenteritis and colitis, unspecified: Secondary | ICD-10-CM | POA: Diagnosis not present

## 2019-01-10 DIAGNOSIS — F419 Anxiety disorder, unspecified: Secondary | ICD-10-CM | POA: Diagnosis present

## 2019-01-10 DIAGNOSIS — Z79899 Other long term (current) drug therapy: Secondary | ICD-10-CM

## 2019-01-10 DIAGNOSIS — I471 Supraventricular tachycardia: Secondary | ICD-10-CM | POA: Diagnosis present

## 2019-01-10 DIAGNOSIS — R112 Nausea with vomiting, unspecified: Secondary | ICD-10-CM

## 2019-01-10 DIAGNOSIS — N281 Cyst of kidney, acquired: Secondary | ICD-10-CM | POA: Diagnosis not present

## 2019-01-10 DIAGNOSIS — R509 Fever, unspecified: Secondary | ICD-10-CM | POA: Diagnosis not present

## 2019-01-10 DIAGNOSIS — K567 Ileus, unspecified: Secondary | ICD-10-CM | POA: Diagnosis not present

## 2019-01-10 DIAGNOSIS — R7303 Prediabetes: Secondary | ICD-10-CM | POA: Diagnosis not present

## 2019-01-10 DIAGNOSIS — E876 Hypokalemia: Secondary | ICD-10-CM | POA: Diagnosis present

## 2019-01-10 DIAGNOSIS — M810 Age-related osteoporosis without current pathological fracture: Secondary | ICD-10-CM | POA: Diagnosis not present

## 2019-01-10 DIAGNOSIS — E872 Acidosis: Secondary | ICD-10-CM | POA: Diagnosis not present

## 2019-01-10 DIAGNOSIS — I2510 Atherosclerotic heart disease of native coronary artery without angina pectoris: Secondary | ICD-10-CM | POA: Diagnosis present

## 2019-01-10 DIAGNOSIS — A419 Sepsis, unspecified organism: Secondary | ICD-10-CM | POA: Diagnosis not present

## 2019-01-10 DIAGNOSIS — E1165 Type 2 diabetes mellitus with hyperglycemia: Secondary | ICD-10-CM | POA: Diagnosis not present

## 2019-01-10 DIAGNOSIS — Z20828 Contact with and (suspected) exposure to other viral communicable diseases: Secondary | ICD-10-CM | POA: Diagnosis present

## 2019-01-10 DIAGNOSIS — I10 Essential (primary) hypertension: Secondary | ICD-10-CM | POA: Diagnosis not present

## 2019-01-10 DIAGNOSIS — K449 Diaphragmatic hernia without obstruction or gangrene: Secondary | ICD-10-CM | POA: Diagnosis not present

## 2019-01-10 LAB — CBC WITH DIFFERENTIAL/PLATELET
Abs Immature Granulocytes: 0.07 10*3/uL (ref 0.00–0.07)
Basophils Absolute: 0 10*3/uL (ref 0.0–0.1)
Basophils Relative: 0 %
Eosinophils Absolute: 0 10*3/uL (ref 0.0–0.5)
Eosinophils Relative: 0 %
HCT: 39.7 % (ref 36.0–46.0)
Hemoglobin: 13.5 g/dL (ref 12.0–15.0)
Immature Granulocytes: 1 %
Lymphocytes Relative: 2 %
Lymphs Abs: 0.3 10*3/uL — ABNORMAL LOW (ref 0.7–4.0)
MCH: 29.2 pg (ref 26.0–34.0)
MCHC: 34 g/dL (ref 30.0–36.0)
MCV: 85.9 fL (ref 80.0–100.0)
Monocytes Absolute: 0.8 10*3/uL (ref 0.1–1.0)
Monocytes Relative: 6 %
Neutro Abs: 11.5 10*3/uL — ABNORMAL HIGH (ref 1.7–7.7)
Neutrophils Relative %: 91 %
Platelets: 234 10*3/uL (ref 150–400)
RBC: 4.62 MIL/uL (ref 3.87–5.11)
RDW: 14.6 % (ref 11.5–15.5)
WBC: 12.7 10*3/uL — ABNORMAL HIGH (ref 4.0–10.5)
nRBC: 0 % (ref 0.0–0.2)

## 2019-01-10 LAB — COMPREHENSIVE METABOLIC PANEL
ALT: 21 U/L (ref 0–44)
AST: 42 U/L — ABNORMAL HIGH (ref 15–41)
Albumin: 4.1 g/dL (ref 3.5–5.0)
Alkaline Phosphatase: 86 U/L (ref 38–126)
Anion gap: 12 (ref 5–15)
BUN: 11 mg/dL (ref 8–23)
CO2: 23 mmol/L (ref 22–32)
Calcium: 9.1 mg/dL (ref 8.9–10.3)
Chloride: 103 mmol/L (ref 98–111)
Creatinine, Ser: 1.04 mg/dL — ABNORMAL HIGH (ref 0.44–1.00)
GFR calc Af Amer: >60 mL/min (ref >60)
GFR calc non Af Amer: 52 mL/min — ABNORMAL LOW (ref >60)
Glucose, Bld: 138 mg/dL — ABNORMAL HIGH (ref 70–99)
Potassium: 3.4 mmol/L — ABNORMAL LOW (ref 3.5–5.1)
Sodium: 138 mmol/L (ref 135–145)
Total Bilirubin: 0.9 mg/dL (ref 0.3–1.2)
Total Protein: 7.4 g/dL (ref 6.5–8.1)

## 2019-01-10 LAB — URINALYSIS, COMPLETE (UACMP) WITH MICROSCOPIC
Bacteria, UA: NONE SEEN
Bilirubin Urine: NEGATIVE
Glucose, UA: NEGATIVE mg/dL
Hgb urine dipstick: NEGATIVE
Ketones, ur: NEGATIVE mg/dL
Leukocytes,Ua: NEGATIVE
Nitrite: NEGATIVE
Protein, ur: NEGATIVE mg/dL
Specific Gravity, Urine: 1.011 (ref 1.005–1.030)
pH: 7 (ref 5.0–8.0)

## 2019-01-10 LAB — GASTROINTESTINAL PANEL BY PCR, STOOL (REPLACES STOOL CULTURE)

## 2019-01-10 LAB — LIPASE, BLOOD: Lipase: 37 U/L (ref 11–51)

## 2019-01-10 LAB — LACTIC ACID, PLASMA
Lactic Acid, Venous: 2.6 mmol/L (ref 0.5–1.9)
Lactic Acid, Venous: 3.4 mmol/L (ref 0.5–1.9)

## 2019-01-10 LAB — SARS CORONAVIRUS 2 BY RT PCR (HOSPITAL ORDER, PERFORMED IN ~~LOC~~ HOSPITAL LAB): SARS Coronavirus 2: NEGATIVE

## 2019-01-10 LAB — MAGNESIUM: Magnesium: 1.7 mg/dL (ref 1.7–2.4)

## 2019-01-10 LAB — TROPONIN I (HIGH SENSITIVITY): Troponin I (High Sensitivity): 6 ng/L (ref <18)

## 2019-01-10 LAB — PROCALCITONIN: Procalcitonin: 0.73 ng/mL

## 2019-01-10 MED ORDER — CARVEDILOL 3.125 MG PO TABS
3.1250 mg | ORAL_TABLET | Freq: Two times a day (BID) | ORAL | Status: DC
  Administered 2019-01-11 – 2019-01-12 (×3): 3.125 mg via ORAL
  Filled 2019-01-10 (×5): qty 1

## 2019-01-10 MED ORDER — ACETAMINOPHEN 650 MG RE SUPP: 650.0000 mg | Freq: Four times a day (QID) | RECTAL | Status: DC | PRN

## 2019-01-10 MED ORDER — ONDANSETRON HCL 4 MG PO TABS
4.0000 mg | ORAL_TABLET | Freq: Four times a day (QID) | ORAL | Status: DC | PRN
  Administered 2019-01-10: 12:00:00 4 mg via ORAL

## 2019-01-10 MED ORDER — METRONIDAZOLE IN NACL 5-0.79 MG/ML-% IV SOLN
500.0000 mg | Freq: Once | INTRAVENOUS | Status: AC
  Administered 2019-01-10: 09:00:00 500 mg via INTRAVENOUS
  Filled 2019-01-10: qty 100

## 2019-01-10 MED ORDER — INFLUENZA VAC A&B SA ADJ QUAD 0.5 ML IM PRSY
0.5000 mL | PREFILLED_SYRINGE | INTRAMUSCULAR | Status: AC
  Administered 2019-01-12: 0.5 mL via INTRAMUSCULAR
  Filled 2019-01-10 (×2): qty 0.5

## 2019-01-10 MED ORDER — VANCOMYCIN HCL 500 MG IV SOLR
500.0000 mg | Freq: Once | INTRAVENOUS | Status: AC
  Administered 2019-01-10: 500 mg via INTRAVENOUS
  Filled 2019-01-10: qty 500

## 2019-01-10 MED ORDER — SODIUM CHLORIDE 0.9 % IV SOLN
2.0000 g | Freq: Two times a day (BID) | INTRAVENOUS | Status: DC
  Administered 2019-01-10 – 2019-01-11 (×3): 2 g via INTRAVENOUS
  Filled 2019-01-10 (×5): qty 2

## 2019-01-10 MED ORDER — OMEGA-3-ACID ETHYL ESTERS 1 G PO CAPS
1.0000 g | ORAL_CAPSULE | Freq: Two times a day (BID) | ORAL | Status: DC
  Administered 2019-01-10 – 2019-01-12 (×4): 1 g via ORAL
  Filled 2019-01-10 (×4): qty 1

## 2019-01-10 MED ORDER — PANTOPRAZOLE SODIUM 40 MG PO TBEC
40.0000 mg | DELAYED_RELEASE_TABLET | Freq: Every day | ORAL | Status: DC
  Administered 2019-01-10 – 2019-01-12 (×3): 40 mg via ORAL
  Filled 2019-01-10 (×3): qty 1

## 2019-01-10 MED ORDER — VANCOMYCIN HCL IN DEXTROSE 1-5 GM/200ML-% IV SOLN
1000.0000 mg | Freq: Once | INTRAVENOUS | Status: AC
  Administered 2019-01-10: 1000 mg via INTRAVENOUS
  Filled 2019-01-10: qty 200

## 2019-01-10 MED ORDER — POTASSIUM CHLORIDE 10 MEQ/100ML IV SOLN
10.0000 meq | INTRAVENOUS | Status: AC
  Administered 2019-01-10 (×4): 10 meq via INTRAVENOUS
  Filled 2019-01-10 (×5): qty 100

## 2019-01-10 MED ORDER — ROSUVASTATIN CALCIUM 10 MG PO TABS
20.0000 mg | ORAL_TABLET | Freq: Every day | ORAL | Status: DC
  Administered 2019-01-10 – 2019-01-12 (×3): 20 mg via ORAL
  Filled 2019-01-10 (×3): qty 2

## 2019-01-10 MED ORDER — VITAMIN D 25 MCG (1000 UNIT) PO TABS
1000.0000 [IU] | ORAL_TABLET | Freq: Every day | ORAL | Status: DC
  Administered 2019-01-10 – 2019-01-12 (×3): 1000 [IU] via ORAL
  Filled 2019-01-10 (×4): qty 1

## 2019-01-10 MED ORDER — LINACLOTIDE 290 MCG PO CAPS
290.0000 ug | ORAL_CAPSULE | Freq: Every day | ORAL | Status: DC
  Administered 2019-01-11 – 2019-01-12 (×2): 290 ug via ORAL
  Filled 2019-01-10 (×3): qty 1

## 2019-01-10 MED ORDER — METRONIDAZOLE IN NACL 5-0.79 MG/ML-% IV SOLN
500.0000 mg | Freq: Three times a day (TID) | INTRAVENOUS | Status: DC
  Administered 2019-01-10 – 2019-01-12 (×5): 500 mg via INTRAVENOUS
  Filled 2019-01-10 (×8): qty 100

## 2019-01-10 MED ORDER — ONDANSETRON HCL 4 MG PO TABS
4.0000 mg | ORAL_TABLET | Freq: Three times a day (TID) | ORAL | Status: DC | PRN
  Filled 2019-01-10: qty 1

## 2019-01-10 MED ORDER — SODIUM CHLORIDE 0.9 % IV BOLUS
500.0000 mL | Freq: Once | INTRAVENOUS | Status: AC
  Administered 2019-01-10: 500 mL via INTRAVENOUS

## 2019-01-10 MED ORDER — ACETAMINOPHEN 325 MG PO TABS: 650.0000 mg | ORAL_TABLET | Freq: Four times a day (QID) | ORAL | Status: DC | PRN

## 2019-01-10 MED ORDER — ONDANSETRON HCL 4 MG/2ML IJ SOLN: 4.0000 mg | Freq: Four times a day (QID) | INTRAMUSCULAR | Status: DC | PRN

## 2019-01-10 MED ORDER — DILTIAZEM HCL ER COATED BEADS 120 MG PO CP24
120.0000 mg | ORAL_CAPSULE | Freq: Every day | ORAL | Status: DC
  Administered 2019-01-10 – 2019-01-12 (×3): 120 mg via ORAL
  Filled 2019-01-10 (×3): qty 1

## 2019-01-10 MED ORDER — ONDANSETRON HCL 4 MG/2ML IJ SOLN
4.0000 mg | Freq: Once | INTRAMUSCULAR | Status: AC
  Administered 2019-01-10: 17:00:00 4 mg via INTRAVENOUS
  Filled 2019-01-10: qty 2

## 2019-01-10 MED ORDER — ENOXAPARIN SODIUM 40 MG/0.4ML ~~LOC~~ SOLN
40.0000 mg | SUBCUTANEOUS | Status: DC
  Administered 2019-01-10 – 2019-01-11 (×2): 40 mg via SUBCUTANEOUS
  Filled 2019-01-10 (×2): qty 0.4

## 2019-01-10 MED ORDER — LOSARTAN POTASSIUM 50 MG PO TABS
100.0000 mg | ORAL_TABLET | Freq: Every day | ORAL | Status: DC
  Administered 2019-01-10 – 2019-01-12 (×3): 100 mg via ORAL
  Filled 2019-01-10 (×3): qty 2

## 2019-01-10 MED ORDER — SODIUM CHLORIDE 0.9 % IV SOLN
1.0000 g | INTRAVENOUS | Status: DC
  Administered 2019-01-10: 1 g via INTRAVENOUS
  Filled 2019-01-10: qty 10

## 2019-01-10 MED ORDER — SODIUM CHLORIDE 0.9 % IV SOLN
INTRAVENOUS | Status: DC
  Administered 2019-01-10 – 2019-01-12 (×3): via INTRAVENOUS

## 2019-01-10 MED ORDER — CLONAZEPAM 0.5 MG PO TABS: 0.5000 mg | ORAL_TABLET | Freq: Two times a day (BID) | ORAL | Status: DC | PRN

## 2019-01-10 MED ORDER — POLYETHYLENE GLYCOL 3350 17 G PO PACK: 17.0000 g | PACK | Freq: Every day | ORAL | Status: DC | PRN

## 2019-01-10 MED ORDER — ONDANSETRON HCL 4 MG/2ML IJ SOLN
4.0000 mg | INTRAMUSCULAR | Status: AC
  Administered 2019-01-10: 4 mg via INTRAVENOUS
  Filled 2019-01-10: qty 2

## 2019-01-10 MED ORDER — SODIUM CHLORIDE 0.9 % IV SOLN: 2.0000 g | Freq: Once | INTRAVENOUS | Status: DC

## 2019-01-10 MED ORDER — SODIUM CHLORIDE 0.9 % IV SOLN
2.0000 g | Freq: Once | INTRAVENOUS | Status: AC
  Administered 2019-01-10: 2 g via INTRAVENOUS
  Filled 2019-01-10: qty 2

## 2019-01-10 MED ORDER — HYDROCODONE-ACETAMINOPHEN 5-325 MG PO TABS: 1.0000 | ORAL_TABLET | ORAL | Status: DC | PRN

## 2019-01-10 NOTE — Progress Notes (Signed)
   01/10/19 1400  Clinical Encounter Type  Visited With Patient  Visit Type Initial  Referral From Nurse  Consult/Referral To Chaplain  Spiritual Encounters  Spiritual Needs Prayer;Emotional;Other (Comment)  [place] entered contact precaution room after inquiring if it were safe to visit patient. Patient was alert and awake upon arrival. Patient was pleasant and talkative. [place] provided pastoral care through completing the Initial Spiritual Care Screening. Patient wanted more information on AD. Pt declined education stating she could "read the booklet herself". [place] built rapport with patient and she shared her concerns with not going to church and the feelings of guilt associated with this. [place] helped patient process this in a non-judgmental way. Pt appreciated [place] and requested prayer. No further needs at this time.

## 2019-01-10 NOTE — ED Triage Notes (Signed)
Patient presents to the ED because she doesn't feel well "at all." Has diarrhea and is "leaking urine". Has been taking Macrobid for a UTI and states she can't really take that so isn't sure if that's whats making her feel bad or not. States no known covid contacts.

## 2019-01-10 NOTE — Progress Notes (Signed)
Family Meeting Note  Advance Directive:no  Today a meeting took place with the Patient.and daughter The following clinical team members were present during this meeting:MD  The following were discussed:Patient's diagnosis: sepsis , Patient's progosis: > 12 months and Goals for treatment: Full Code  Additional follow-up to be provided: chaplain to create AD   Time spent during discussion:16 minutes  Bettey Costa, MD

## 2019-01-10 NOTE — ED Notes (Signed)
Patient transported to CT 

## 2019-01-10 NOTE — ED Notes (Addendum)
Specimen collection "hat" in toilet and wipes by toilet for pt use, says she does not need to void at this time; daughter at bedside

## 2019-01-10 NOTE — ED Notes (Signed)
Report given to Erica, RN

## 2019-01-10 NOTE — ED Provider Notes (Signed)
[facility] Emergency Department Provider Note  ____________________________________________   First MD Initiated Contact with Patient 01/10/19 [phone]     (approximate)  I have reviewed the triage vital signs and the nursing notes.   HISTORY  Chief Complaint Urinary Tract Infection (already being treated with macrobid) and Generalized Body Aches    HPI Amanda Duke is a 78 y.o. female with medical issues as listed below who presents with her daughter for evaluation of acute onset nausea, vomiting, and diarrhea.  The symptoms started acutely last night about 6 hours ago (about 11 PM) and then been severe with multiple episodes of both vomiting and diarrhea.  She reports that she was seen by her primary care doctor little over a week ago and did not get her results back until about 3 days ago.  They told her she had a very mild urinary tract infection and she was started on Macrobid.  She has only had 2 of the doses (yesterday) and that her symptoms started last night.  She believes it is a side effect of the medication.  No one else shared food with her tonight at dinner and she ate some soup about 4 hours prior to the onset of the symptoms.  She is not having any abdominal pain although it aches a little bit when she throws up.  She denies chest pain or shortness of breath.  She has not been around anyone with COVID-19 and she denies sore throat, loss of smell and taste, shortness of breath, cough, chest pain.  Nothing particular is making the symptoms better or worse.  She said that she feels very dry because she has not been able to eat or drink anything since 11 PM and she has been vomiting and having diarrhea.         Past Medical History:  Diagnosis Date  . Anxiety   . Coronary artery disease   . GERD (gastroesophageal reflux disease)   . Hyperlipemia   . Hypertension   . Irregular heart beat   . Pre-diabetes   . Prediabetes   . Renal cyst    bilateral    Patient Active Problem List   Diagnosis Date Noted  . Nausea 08/21/2018  . Cough 04/16/2018  . Encounter for general adult medical examination with abnormal findings 12/24/2017  . Generalized anxiety disorder 12/24/2017  . Need for shingles vaccine 12/24/2017  . Urinary tract infection without hematuria 09/12/2017  . Acute sinusitis, unspecified 04/24/2017  . Noninfective gastroenteritis and colitis, unspecified 04/24/2017  . Solitary pulmonary nodule 04/24/2017  . Acute pharyngitis, unspecified 04/24/2017  . Chronic kidney disease, 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/2017  . Palpitations 04/24/2017  . Primary generalized (osteo)arthritis 04/24/2017  . Vitamin D deficiency 04/24/2017  . Impaired fasting glucose 04/24/2017  . Dysuria 04/24/2017  . Insomnia, unspecified 04/24/2017  . Age-related osteoporosis without current pathological fracture 04/24/2017  . 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 Paroxysmal supraventricular tachycardia ([place]) 05/23/2015  . Family history of breast cancer in first degree relative 02/02/2015  . Family history of ovarian cancer 02/02/2015  . Irritable bowel syndrome with constipation 02/02/2015  . Anxiety 02/02/2015  . Osteoporosis 02/02/2015  . Benign essential HTN 12/16/2014  . Mixed hyperlipidemia 05/19/2014  . Acid reflux 05/19/2014  . Arteriosclerosis of coronary artery 05/19/2014  . Chest pain 05/19/2014  . Gastroesophageal reflux disease 05/19/2014  . Coronary artery disease 05/19/2014  . 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 Urge incontinence 11/20/2012  . Renal colic 0000000  . Symptoms involving urinary system 11/20/2012  . Incomplete bladder emptying 11/20/2012  . Frank hematuria 11/20/2012  . Bladder infection, chronic 11/20/2012    Past Surgical History:  Procedure Laterality Date  . APPENDECTOMY  1971  . ARTHRODESIS  1980  . BACK  SURGERY  1980  . COLONOSCOPY WITH PROPOFOL N/A 12/12/2016   Procedure: COLONOSCOPY WITH PROPOFOL;  Surgeon: Lollie Sails, MD;  Location: [facility] ENDOSCOPY;  Service: Endoscopy;  Laterality: N/A;  . ESOPHAGOGASTRODUODENOSCOPY (EGD) WITH PROPOFOL N/A 12/12/2016   Procedure: ESOPHAGOGASTRODUODENOSCOPY (EGD) WITH PROPOFOL;  Surgeon: Lollie Sails, MD;  Location: [facility] ENDOSCOPY;  Service: Endoscopy;  Laterality: N/A;  . [place]   right  . TONSILLECTOMY  1948  . TUBAL LIGATION  1971  . VAGINAL HYSTERECTOMY  1985   d/t heavy bleeding    Prior to Admission medications   Medication Sig Start Date End Date Taking? Authorizing Provider  carvedilol (COREG) 3.125 MG tablet Take by mouth. 12/25/18 12/25/19  [provider]  cholecalciferol (VITAMIN D) 1000 units tablet Take 1,000 Units by mouth daily.    [provider]  clonazePAM (KLONOPIN) 0.5 MG tablet TAKE 1 TABLET BY MOUTH AT BEDTIME AS NEEDED INSOMNIA/ANXIETY 09/28/18   Ronnell Freshwater, NP  diltiazem (CARDIZEM CD) 120 MG 24 hr capsule Take 120 mg by mouth daily.  03/11/18 03/11/19  [provider]  fluticasone (FLONASE) 50 MCG/ACT nasal spray Place 2 sprays into the nose. 03/20/18   [provider]  Icosapent Ethyl (VASCEPA) 1 g CAPS Take 1 g by mouth 2 (two) times daily.     [provider]  linaclotide (LINZESS) 290 MCG CAPS capsule Take 1 capsule (290 mcg total) by mouth daily. 09/28/18   Ronnell Freshwater, NP  losartan (COZAAR) 100 MG tablet Take 100 mg by mouth daily.    [provider]  nitrofurantoin, macrocrystal-monohydrate, (MACROBID) 100 MG capsule Take 1 capsule (100 mg total) by mouth 2 (two) times daily. 01/06/19   Ronnell Freshwater, NP  ondansetron (ZOFRAN) 4 MG tablet Take 1 tablet (4 mg total) by mouth every 8 (eight) hours as needed for nausea or vomiting. 08/21/18   Ronnell Freshwater, NP  pantoprazole (PROTONIX) 40 MG tablet Take 40  mg by mouth daily.  03/09/18   [provider]  rosuvastatin (CRESTOR) 20 MG tablet Take 1 tablet (20 mg total) by mouth daily. 12/15/18   Ronnell Freshwater, NP    Allergies Amoxicillin, Clindamycin, Gemfibrozil, Levofloxacin, Levofloxacin in d5w, Metoprolol, Simvastatin, Sulfa antibiotics, and Nystatin-triamcinolone  Family History  Problem Relation Age of Onset  . Heart failure Mother   . Stroke Mother   . Breast cancer Mother        dx at age 21 yo  . Diabetes Mother   . Osteoporosis Mother   . Emphysema Father   . Ovarian cancer Sister        dx at age 78 yo  . Asthma Sister     Social History Social History   Tobacco Use  . Smoking status: Former Research scientist (life sciences)  . Smokeless tobacco: Never Used  Substance Use Topics  . Alcohol use: No    Alcohol/week: 0.0 standard drinks  . Drug use: Not Currently    Review of Systems Constitutional: No fever/chills Eyes: No visual changes. ENT: No sore throat. Cardiovascular: Denies chest pain. Respiratory: Denies shortness of breath.  Gastrointestinal: Acute onset nausea, vomiting, and diarrhea.  No abdominal pain or distention. Genitourinary: Negative for dysuria. Musculoskeletal: Negative for neck pain.  Negative for back pain. Integumentary: Negative for rash. Neurological: Negative for headaches, focal weakness or numbness.   ____________________________________________   PHYSICAL EXAM:  VITAL SIGNS: ED Triage Vitals  Enc Vitals Group     BP 01/10/19 0505 (!) 148/85     Pulse Rate 01/10/19 0505 (!) 115     Resp 01/10/19 0505 18     Temp 01/10/19 0505 98.5 F (36.9 C)     Temp Source 01/10/19 0505 Oral     SpO2 01/10/19 0505 96 %     Weight 01/10/19 0506 82.6 kg (182 lb)     Height 01/10/19 0506 1.676 m (5\' 6" )     Head Circumference --      Peak Flow --      Pain Score 01/10/19 0503 0     Pain Loc --      Pain Edu? --      Excl. in [place]? --     Constitutional: Alert and oriented.  Appears uncomfortable but  is not in severe distress, still able to interact and even joke with me. Eyes: Conjunctivae are normal.  Head: Atraumatic. Nose: No congestion/rhinnorhea. Mouth/Throat: Mucous membranes are moist. Neck: No stridor.  No meningeal signs.   Cardiovascular: Normal rate, regular rhythm. Good peripheral circulation. Grossly normal heart sounds. Respiratory: Normal respiratory effort.  No retractions. Gastrointestinal: Soft and nondistended.  No tenderness to palpation even with deep palpation all throughout her abdominal quadrants. Musculoskeletal: No lower extremity tenderness nor edema. No gross deformities of extremities. Neurologic:  Normal speech and language. No gross focal neurologic deficits are appreciated.  Skin:  Skin is warm, dry and intact. Psychiatric: Mood and affect are normal. Speech and behavior are normal.  ____________________________________________   LABS (all labs ordered are listed, but only abnormal results are displayed)  Labs Reviewed  CBC WITH DIFFERENTIAL/PLATELET - Abnormal; Notable for the following components:      Result Value   WBC 12.7 (*)    Neutro Abs 11.5 (*)    Lymphs Abs 0.3 (*)    All other components within normal limits  COMPREHENSIVE METABOLIC PANEL - Abnormal; Notable for the following components:   Potassium 3.4 (*)    Glucose, Bld 138 (*)    Creatinine, Ser 1.04 (*)    AST 42 (*)    GFR calc non Af Amer 52 (*)    All other components within normal limits  URINE CULTURE  SARS CORONAVIRUS 2 (HOSPITAL ORDER, PERFORMED IN [facility])  CULTURE, BLOOD (ROUTINE X 2)  CULTURE, BLOOD (ROUTINE X 2)  LIPASE, BLOOD  MAGNESIUM  URINALYSIS, COMPLETE (UACMP) WITH MICROSCOPIC  LACTIC ACID, PLASMA  LACTIC ACID, PLASMA  PROCALCITONIN  TROPONIN I (HIGH SENSITIVITY)   ____________________________________________  EKG  ED ECG REPORT I, Hinda Kehr, the attending physician, personally viewed and interpreted this ECG.  Date:  01/10/2019 EKG Time: 6:34 AM Rate: 120 Rhythm: Sinus tachycardia QRS Axis: normal Intervals: Prolonged QTC of 537 ms ST/T Wave abnormalities: Nonspecific T wave changes including what appears to be a notched EKG in lead II. Narrative Interpretation: no definitive evidence of acute ischemia; does not meet STEMI criteria.  The T wave changes most notable in lead II are different from the prior EKG on record.   ____________________________________________  RADIOLOGY I, Hinda Kehr, personally viewed and evaluated these images (plain radiographs) as part of  my medical decision making, as well as reviewing the written report by the radiologist.  ED MD interpretation:  CXR pending at time of signout  Official radiology report(s): No results found.  ____________________________________________   PROCEDURES   Procedure(s) performed (including Critical Care):  .Critical Care Performed by: Hinda Kehr, MD Authorized by: Hinda Kehr, MD   Critical care provider statement:    Critical care time (minutes):  30   Critical care time was exclusive of:  Separately billable procedures and treating other patients   Critical care was necessary to treat or prevent imminent or life-threatening deterioration of the following conditions:  Sepsis   Critical care was time spent personally by me on the following activities:  Development of treatment plan with patient or surrogate, discussions with consultants, evaluation of patient's response to treatment, examination of patient, obtaining history from patient or surrogate, ordering and performing treatments and interventions, ordering and review of laboratory studies, ordering and review of radiographic studies, pulse oximetry, re-evaluation of patient's condition and review of old charts     ____________________________________________   [place] / MDM / [place] / ED COURSE  As part of my medical decision making, I  reviewed the following data within the [place] notes reviewed and incorporated, Labs reviewed , EKG interpreted , Old chart reviewed, Patient signed out to Dr. Kerman Passey,  Radiograph pending,  and Notes from prior ED visits   Differential diagnosis includes, but is not limited to, preformed foodborne pathogen, viral or bacterial gastroenteritis, UTI, medication side effect.  COVID-19 is less likely though still possible that she does not meet criteria for emergent rapid testing.  Her vitals are stable though she is little bit tachycardic.  She has a reassuring abdominal exam but no tenderness to palpation and no distention.  I will provide a 500 mL normal saline fluid bolus and check lab work including a urinalysis, CMP, lipase, and CBC.  I will also check an EKG although this would be a very unusual presentation for ACS.  No indication for high-sensitivity troponin based on the testing algorithm and the lack of chest pain.  I will reassess after lab work and after a dose of Zofran 4 mg IV and her fluid bolus.      Clinical Course as of Jan 10 708  Sun Jan 10, 2019  0627 Patient said she was started to feel hot so her temperature was rechecked and she is 1-1.5.  I have added on a lactic acid, pro calcitonin, blood cultures, and now will coronavirus to rapid swab.  Lab work is pending.  Temp(!): 101.5 F (38.6 C) [CF]  0651 WBC(!): 12.7 [CF]  0655 Chest x-ray has been ordered, urine needs to be obtained.   [CF]  [phone] Transferring ED care to Dr. Kerman Passey at 7:00am to follow up on lab results and disposition plan.  Anticipate admission at this point given patient's vital signs and age.   [CF]    Clinical Course User Index [CF] Hinda Kehr, MD     ____________________________________________  FINAL CLINICAL IMPRESSION(S) / ED DIAGNOSES  Final diagnoses:  Fever, unspecified fever cause  Sepsis, due to unspecified organism, unspecified whether acute  organ dysfunction present (HCC)  Nausea vomiting and diarrhea     MEDICATIONS GIVEN DURING THIS VISIT:  Medications  cefTRIAXone (ROCEPHIN) 1 g in sodium chloride 0.9 % 100 mL IVPB (has no administration in time range)  sodium chloride 0.9 % bolus 500 mL (500 mLs Intravenous New  Bag/Given 01/10/19 0618)  ondansetron (ZOFRAN) injection 4 mg (4 mg Intravenous Given 01/10/19 [id number])     ED Discharge Orders    None      *Please note:  Amanda Duke was evaluated in Emergency Department on 01/10/2019 for the symptoms described in the history of present illness. She was evaluated in the context of the global COVID-19 pandemic, which necessitated consideration that the patient might be at risk for infection with the SARS-CoV-2 virus that causes COVID-19. Institutional protocols and algorithms that pertain to the evaluation of patients at risk for COVID-19 are in a state of rapid change based on information released by regulatory bodies including the CDC and federal and state organizations. These policies and algorithms were followed during the patient's care in the ED.  Some ED evaluations and interventions may be delayed as a result of limited staffing during the pandemic.*  Note:  This document was prepared using Dragon voice recognition software and may include unintentional dictation errors.   Hinda Kehr, MD 01/10/19 [phone]

## 2019-01-10 NOTE — ED Provider Notes (Signed)
-----------------------------------------   9:56 AM on 01/10/2019 -----------------------------------------  Patient care assumed from Dr. Karma Greaser.  Patient appears well at this time.  Her lab work shows an elevated lactic acid as well as elevated white blood cell count along with a temperature of 101.5 meeting sepsis criteria.  Patient is receiving IV antibiotics.  Her urinalysis appears largely normal.  Urine culture is pending.  COVID test is negative.  Chest x-ray appears normal.  At this time it is not entirely clear the cause of the patient's fever.  Given acute onset of nausea vomiting diarrhea this morning gastroenteritis or GI pathogen would still remain on the differential.  We will continue IV antibiotics, IV fluids and admit to the hospitalist service.   Harvest Dark, MD 01/10/19 [phone]

## 2019-01-10 NOTE — Progress Notes (Signed)
CODE SEPSIS - PHARMACY COMMUNICATION  **Broad Spectrum Antibiotics should be administered within 1 hour of Sepsis diagnosis**  Time Code Sepsis Called/Page Received: 07:09  Antibiotics Ordered: Ceftriaxone  Time of 1st antibiotic administration: 07:42  Additional action taken by pharmacy:    If necessary, Name of Provider/Nurse Contacted:      Vira Blanco ,PharmD Clinical Pharmacist  01/10/2019  8:00 AM

## 2019-01-10 NOTE — H&P (Signed)
[facility] [place] NAME: Amanda Duke    MR#:  [id number]  DATE OF BIRTH:  20-Nov-1940  DATE OF ADMISSION:  01/10/2019  PRIMARY CARE PHYSICIAN: Lavera Guise, MD   REQUESTING/REFERRING PHYSICIAN: dr Kerman Passey  CHIEF COMPLAINT:   Nausea, vomiting and abdominal pain HISTORY OF PRESENT ILLNESS:  Amanda Duke  is a 78 y.o. female with a known history of bilateral renal cysts, CAD and essential hypertension who presents today to the emergency room due to above issue.  Patient reports that she has been recently treated for urinary tract infection on Macrobid.  Since that time she has not been feeling well.  This morning she woke up with nausea, vomiting and loose stools.  She also has generalized crampy abdominal pain.  She denies dysuria, frequency urgency.  She denies chest pain or shortness of breath.  In the emergency room UA was essentially unremarkable.  Chest x-ray showed no evidence of pulmonary infection.  COVID testing is negative.  She presented to the emergency room with a fever 101.5, tachypnea and tachycardic.  She denies sick contacts or food poisoning.  She was given broad-spectrum antibiotics for sepsis.  PAST MEDICAL HISTORY:   Past Medical History:  Diagnosis Date  . Anxiety   . Coronary artery disease   . GERD (gastroesophageal reflux disease)   . Hyperlipemia   . Hypertension   . Irregular heart beat   . Pre-diabetes   . Prediabetes   . Renal cyst    bilateral    PAST SURGICAL HISTORY:   Past Surgical History:  Procedure Laterality Date  . APPENDECTOMY  1971  . ARTHRODESIS  1980  . BACK SURGERY  1980  . COLONOSCOPY WITH PROPOFOL N/A 12/12/2016   Procedure: COLONOSCOPY WITH PROPOFOL;  Surgeon: Lollie Sails, MD;  Location: [facility] ENDOSCOPY;  Service: Endoscopy;  Laterality: N/A;  . ESOPHAGOGASTRODUODENOSCOPY (EGD) WITH PROPOFOL N/A 12/12/2016   Procedure: ESOPHAGOGASTRODUODENOSCOPY (EGD) WITH PROPOFOL;   Surgeon: Lollie Sails, MD;  Location: [facility] ENDOSCOPY;  Service: Endoscopy;  Laterality: N/A;  . [place]   right  . TONSILLECTOMY  1948  . TUBAL LIGATION  1971  . VAGINAL HYSTERECTOMY  1985   d/t heavy bleeding    SOCIAL HISTORY:   Social History   Tobacco Use  . Smoking status: Former Research scientist (life sciences)  . Smokeless tobacco: Never Used  Substance Use Topics  . Alcohol use: No    Alcohol/week: 0.0 standard drinks    FAMILY HISTORY:   Family History  Problem Relation Age of Onset  . Heart failure Mother   . Stroke Mother   . Breast cancer Mother        dx at age 62 yo  . Diabetes Mother   . Osteoporosis Mother   . Emphysema Father   . Ovarian cancer Sister        dx at age 60 yo  . Asthma Sister     DRUG ALLERGIES:   Allergies  Allergen Reactions  . Amoxicillin Hives    Patient tolerated Ceftriaxone in ED on 01/10/19.   Marland Kitchen Clindamycin Diarrhea  . Gemfibrozil Other (See Comments)    muscle ache  . Levofloxacin Other (See Comments)  . Levofloxacin In D5w Other (See Comments)  . Metoprolol Other (See Comments)  . Simvastatin Other (See Comments)    muscle ache  . Sulfa Antibiotics Other (See Comments)  . Nystatin-Triamcinolone Rash    REVIEW OF SYSTEMS:  Review of Systems  Constitutional: Positive for malaise/fatigue. Negative for chills and fever.  HENT: Negative.  Negative for ear discharge, ear pain, hearing loss, nosebleeds and sore throat.   Eyes: Negative.  Negative for blurred vision and pain.  Respiratory: Negative.  Negative for cough, hemoptysis, shortness of breath and wheezing.   Cardiovascular: Negative.  Negative for chest pain, palpitations and leg swelling.  Gastrointestinal: Positive for abdominal pain, diarrhea, nausea and vomiting. Negative for blood in stool.  Genitourinary: Negative.  Negative for dysuria.  Musculoskeletal: Negative.  Negative for back pain.  Skin: Negative.   Neurological:  Negative for dizziness, tremors, speech change, focal weakness, seizures and headaches.  Endo/Heme/Allergies: Negative.  Does not bruise/bleed easily.  Psychiatric/Behavioral: Negative.  Negative for depression, hallucinations and suicidal ideas.    MEDICATIONS AT HOME:   Prior to Admission medications   Medication Sig Start Date End Date Taking? Authorizing Provider  carvedilol (COREG) 3.125 MG tablet Take 3.125 mg by mouth 2 (two) times daily with a meal.  12/25/18 12/25/19 Yes [provider]  cholecalciferol (VITAMIN D) 1000 units tablet Take 1,000 Units by mouth daily.   Yes [provider]  clonazePAM (KLONOPIN) 0.5 MG tablet TAKE 1 TABLET BY MOUTH AT BEDTIME AS NEEDED INSOMNIA/ANXIETY 09/28/18  Yes Boscia, Heather E, NP  diltiazem (CARDIZEM CD) 120 MG 24 hr capsule Take 120 mg by mouth daily.  03/11/18 03/11/19 Yes [provider]  fluticasone (FLONASE) 50 MCG/ACT nasal spray Place 2 sprays into the nose as needed.  03/20/18  Yes [provider]  Icosapent Ethyl (VASCEPA) 1 g CAPS Take 1 g by mouth 2 (two) times daily.    Yes [provider]  linaclotide (LINZESS) 290 MCG CAPS capsule Take 1 capsule (290 mcg total) by mouth daily. 09/28/18  Yes Boscia, Greer Ee, NP  losartan (COZAAR) 100 MG tablet Take 100 mg by mouth daily.   Yes [provider]  nitrofurantoin, macrocrystal-monohydrate, (MACROBID) 100 MG capsule Take 1 capsule (100 mg total) by mouth 2 (two) times daily. 01/06/19  Yes Boscia, Greer Ee, NP  ondansetron (ZOFRAN) 4 MG tablet Take 1 tablet (4 mg total) by mouth every 8 (eight) hours as needed for nausea or vomiting. 08/21/18  Yes Boscia, Heather E, NP  pantoprazole (PROTONIX) 40 MG tablet Take 40 mg by mouth daily.  03/09/18  Yes [provider]  rosuvastatin (CRESTOR) 20 MG tablet Take 1 tablet (20 mg total) by mouth daily. 12/15/18  Yes Boscia, Greer Ee, NP      VITAL SIGNS:  Blood pressure (!) 111/52, pulse 88,  temperature (!) 101.5 F (38.6 C), temperature source Oral, resp. rate 16, height 5\' 6"  (1.676 m), weight 82.6 kg, SpO2 96 %.  PHYSICAL EXAMINATION:   Physical Exam Constitutional:      General: She is not in acute distress. HENT:     Head: Normocephalic.  Eyes:     General: No scleral icterus. Neck:     Musculoskeletal: Normal range of motion and neck supple.     Vascular: No JVD.     Trachea: No tracheal deviation.  Cardiovascular:     Rate and Rhythm: Normal rate and regular rhythm.     Heart sounds: Normal heart sounds. No murmur. No friction rub. No gallop.   Pulmonary:     Effort: Pulmonary effort is normal. No respiratory distress.     Breath sounds: Normal breath sounds. No wheezing or rales.  Chest:     Chest wall: No tenderness.  Abdominal:     General: Bowel sounds are normal. There is no distension.     Palpations: Abdomen is soft. There is no mass.     Tenderness: There is no abdominal tenderness. There is no guarding or rebound.  Musculoskeletal: Normal range of motion.  Skin:    General: Skin is warm.     Findings: No erythema or rash.  Neurological:     General: No focal deficit present.     Mental Status: She is alert and oriented to person, place, and time. Mental status is at baseline.  Psychiatric:        Judgment: Judgment normal.       LABORATORY PANEL:   CBC Recent Labs  Lab 01/10/19 0617  WBC 12.7*  HGB 13.5  HCT 39.7  PLT 234   ------------------------------------------------------------------------------------------------------------------  Chemistries  Recent Labs  Lab 01/10/19 0617  NA 138  K 3.4*  CL 103  CO2 23  GLUCOSE 138*  BUN 11  CREATININE 1.04*  CALCIUM 9.1  MG 1.7  AST 42*  ALT 21  ALKPHOS 86  BILITOT 0.9   ------------------------------------------------------------------------------------------------------------------  Cardiac Enzymes No results for input(s): TROPONINI in the last 168  hours. ------------------------------------------------------------------------------------------------------------------  RADIOLOGY:  Dg Chest Portable 1 View  Result Date: 01/10/2019 CLINICAL DATA:  78 year old female with history of diarrhea. Leaking urine. EXAM: PORTABLE CHEST 1 VIEW COMPARISON:  Chest x-ray 08/21/2018. FINDINGS: Lung volumes are normal. No consolidative airspace disease. No pleural effusions. No pneumothorax. No suspicious appearing pulmonary nodules or masses. No evidence of pulmonary edema. Heart size is mildly enlarged. The patient is rotated to the right on today's exam, resulting in distortion of the mediastinal contours and reduced diagnostic sensitivity and specificity for mediastinal pathology. Retrocardiac density, similar to prior examinations, likely to reflect a large hiatal hernia. IMPRESSION: 1. No radiographic evidence of acute cardiopulmonary disease. 2. Large hiatal hernia. Electronically Signed   By: Vinnie Langton M.D.   On: 01/10/2019 07:32    EKG:   Orders placed or performed during the hospital encounter of 01/10/19  . ED EKG  . ED EKG  . EKG 12-Lead  . EKG 12-Lead    IMPRESSION AND PLAN:   78 year old female with essential hypertension presents to the emergency room with nausea, vomiting abdominal pain and loose stools.  1.  Sepsis with fever, tachycardia and tachypnea due to gastroenteritis versus colitis with nausea, vomiting and abdominal pain: Continue IV fluids and follow lactic acid level Follow-up on GI panel ordered in the emergency room. Follow-up on CT scan of the abdomen ordered by ER physician. Empiric Zosyn until CT scan results and blood/GI panel culture results. Antiemetics PRN  2.  Mild hypokalemia in the setting of nausea and vomiting: Replete and recheck in a.m.  3.  Essential hypertension: Continue Coreg and losartan  4.  Paroxysmal SVT: Continue diltiazem  5.  CAD: Continue aspirin, statin and ARB. She follows with  Dr Nehemiah Massed   6. Bilateral complex renal cysts: Patient follows with Dr. Jacqlyn Larsen Follow-up on today's CT scan. All the records are reviewed and case discussed with ED provider. Management plans discussed with the patient and she is in agreement  CODE STATUS: full  TOTAL TIME TAKING CARE OF THIS PATIENT: 41 minutes.    Bettey Costa M.D on 01/10/2019 at 10:10 AM  Between 7am to 6pm - Pager - [phone]  After 6pm go to [email or web address] - password [email or web address] [facility]  Office  [phone]  CC: Primary care  physician; Lavera Guise, MD

## 2019-01-10 NOTE — Progress Notes (Signed)
PHARMACY -  BRIEF ANTIBIOTIC NOTE   Pharmacy has received consult(s) for aztreonam and vancomycin from an ED provider.  The patient's profile has been reviewed for ht/wt/allergies/indication/available labs.    One time order(s) placed for cefepime 2g IV x 1; per RN patient tolerated ceftriaxone administration earlier this am. Vancomycin 1g IV followed by vancomycin 500mg  for total loading dose of vancomycin 1500mg .   Further antibiotics/pharmacy consults should be ordered by admitting physician if indicated.                       Thank you, Zaleah Ternes L 01/10/2019  8:43 AM

## 2019-01-11 LAB — URINE CULTURE
Culture: NO GROWTH
Special Requests: NORMAL

## 2019-01-11 LAB — CBC
HCT: 32.9 % — ABNORMAL LOW (ref 36.0–46.0)
Hemoglobin: 10.9 g/dL — ABNORMAL LOW (ref 12.0–15.0)
MCH: 28.8 pg (ref 26.0–34.0)
MCHC: 33.1 g/dL (ref 30.0–36.0)
MCV: 86.8 fL (ref 80.0–100.0)
Platelets: 189 10*3/uL (ref 150–400)
RBC: 3.79 MIL/uL — ABNORMAL LOW (ref 3.87–5.11)
RDW: 15 % (ref 11.5–15.5)
WBC: 8.6 10*3/uL (ref 4.0–10.5)
nRBC: 0 % (ref 0.0–0.2)

## 2019-01-11 LAB — BASIC METABOLIC PANEL
Anion gap: 7 (ref 5–15)
BUN: 12 mg/dL (ref 8–23)
CO2: 22 mmol/L (ref 22–32)
Calcium: 8.2 mg/dL — ABNORMAL LOW (ref 8.9–10.3)
Chloride: 108 mmol/L (ref 98–111)
Creatinine, Ser: 1.04 mg/dL — ABNORMAL HIGH (ref 0.44–1.00)
GFR calc Af Amer: >60 mL/min (ref >60)
GFR calc non Af Amer: 52 mL/min — ABNORMAL LOW (ref >60)
Glucose, Bld: 103 mg/dL — ABNORMAL HIGH (ref 70–99)
Potassium: 3.4 mmol/L — ABNORMAL LOW (ref 3.5–5.1)
Sodium: 137 mmol/L (ref 135–145)

## 2019-01-11 LAB — PROCALCITONIN: Procalcitonin: 3.53 ng/mL

## 2019-01-11 LAB — LACTIC ACID, PLASMA: Lactic Acid, Venous: 1.2 mmol/L (ref 0.5–1.9)

## 2019-01-11 MED ORDER — POTASSIUM CHLORIDE CRYS ER 20 MEQ PO TBCR
40.0000 meq | EXTENDED_RELEASE_TABLET | Freq: Once | ORAL | Status: AC
  Administered 2019-01-11: 40 meq via ORAL
  Filled 2019-01-11: qty 2

## 2019-01-11 NOTE — Evaluation (Signed)
Physical Therapy Evaluation Patient Details Name: Amanda Duke MRN: [id number] DOB: 1940/07/27 Today's Date: 01/11/2019   History of Present Illness  presented to ER secnodary to nausea/vomiting; admitted for management of sepsis, unknown etiology.  Clinical Impression  Upon evaluation, patient alert and oriented; follows commands and demonstrates good effort with all mobility tasks.  Reports feeling "much better" than previous date, endorsing significant improvement in symptoms and overall presentation.  Able to complete sit/stand, basic transfers and gait (250') without assist device, mod indep; fair/steady cadence and gait speed (10' walk time, 6 seconds) without buckling, LOB or safety concern.  Completes dynamic gait activities without gait deviation; voices mobility performance feels baseline for her. Given mod indep with all mobility and overall comfort/confidence with all mobility tasks, patient without need for acute PT intervention.  Will complete order at this time; please re-consult should needs change.    Follow Up Recommendations No PT follow up    Equipment Recommendations       Recommendations for Other Services       Precautions / Restrictions Precautions Precautions: Fall Restrictions Weight Bearing Restrictions: No      Mobility  Bed Mobility               General bed mobility comments: seated edge of bed beginning of session, in recliner end of session  Transfers Overall transfer level: Modified independent Equipment used: None Transfers: Sit to/from Stand Sit to Stand: Modified independent (Device/Increase time)         General transfer comment: good LE strenght/power, limited/no use of UEs support to complete  Ambulation/Gait Ambulation/Gait assistance: Modified independent (Device/Increase time) Gait Distance (Feet): 250 Feet Assistive device: None   Gait velocity: 10' walk time, 6 seconds   General Gait Details: reciprocal stepping  pattern, good step height/length; steady cadence without buckling, LOB or safety concern.  Stairs            Wheelchair Mobility    Modified Rankin (Stroke Patients Only)       Balance Overall balance assessment: Modified Independent                                           Pertinent Vitals/Pain Pain Assessment: No/denies pain    Home Living Family/patient expects to be discharged to:: Private residence Living Arrangements: Children Available Help at Discharge: Available 24 hours/day Type of Home: House Home Access: Stairs to enter Entrance Stairs-Rails: Right Entrance Stairs-Number of Steps: 3 Home Layout: One level Home Equipment: None      Prior Function Level of Independence: Independent               Hand Dominance        Extremity/Trunk Assessment   Upper Extremity Assessment Upper Extremity Assessment: Overall WFL for tasks assessed    Lower Extremity Assessment Lower Extremity Assessment: Overall WFL for tasks assessed(grossly 4+/5 throughout)       Communication   Communication: No difficulties  Cognition Arousal/Alertness: Awake/alert Behavior During Therapy: WFL for tasks assessed/performed Overall Cognitive Status: Within Functional Limits for tasks assessed                                        General Comments      Exercises     Assessment/Plan  PT Assessment Patent does not need any further PT services  PT Problem List         PT Treatment Interventions      PT Goals (Current goals can be found in the Care Plan section)  Acute Rehab PT Goals Patient Stated Goal: to return home PT Goal Formulation: With patient Time For Goal Achievement: 01/25/19 Potential to Achieve Goals: Good    Frequency     Barriers to discharge        Co-evaluation               AM-PAC PT "6 Clicks" Mobility  Outcome Measure Help needed turning from your back to your side while in a flat  bed without using bedrails?: None Help needed moving from lying on your back to sitting on the side of a flat bed without using bedrails?: None Help needed moving to and from a bed to a chair (including a wheelchair)?: None Help needed standing up from a chair using your arms (e.g., wheelchair or bedside chair)?: None Help needed to walk in hospital room?: None Help needed climbing 3-5 steps with a railing? : None 6 Click Score: 24    End of Session Equipment Utilized During Treatment: Gait belt Activity Tolerance: Patient tolerated treatment well Patient left: in chair;with call bell/phone within reach Nurse Communication: Mobility status PT Visit Diagnosis: Difficulty in walking, not elsewhere classified (R26.2);Muscle weakness (generalized) (M62.81)    Time: [id number] PT Time Calculation (min) (ACUTE ONLY): 13 min   Charges:   PT Evaluation $PT Eval Low Complexity: 1 Low          [place]ton Rios H. Owens Shark, PT, DPT, NCS 01/11/19, 11:50 AM [phone]

## 2019-01-11 NOTE — Progress Notes (Signed)
[facility] [place] NAME: Amanda Duke    MR#:  [id number]  DATE OF BIRTH:  Aug 12, 1940  SUBJECTIVE:   States she is feeling a little bit better today.  She has not had any additional episodes of vomiting, but does state that she has been having "dry heaves".  Her abdominal pain has improved.  She denies any fevers or chills.  REVIEW OF SYSTEMS:  Review of Systems  Constitutional: Negative for chills and fever.  HENT: Negative for congestion and sore throat.   Eyes: Negative for blurred vision and double vision.  Respiratory: Negative for cough and shortness of breath.   Cardiovascular: Negative for chest pain and palpitations.  Gastrointestinal: Positive for abdominal pain, nausea and vomiting.  Genitourinary: Negative for dysuria and urgency.  Musculoskeletal: Negative for back pain and neck pain.  Neurological: Negative for dizziness and headaches.  Psychiatric/Behavioral: Negative for depression. The patient is not nervous/anxious.     DRUG ALLERGIES:   Allergies  Allergen Reactions  . Amoxicillin Hives    Patient tolerated Ceftriaxone in ED on 01/10/19.   Marland Kitchen Clindamycin Diarrhea  . Gemfibrozil Other (See Comments)    muscle ache  . Levofloxacin Other (See Comments)  . Levofloxacin In D5w Other (See Comments)  . Metoprolol Other (See Comments)  . Simvastatin Other (See Comments)    muscle ache  . Sulfa Antibiotics Other (See Comments)  . Nystatin-Triamcinolone Rash   VITALS:  Blood pressure 116/70, pulse 65, temperature 98 F (36.7 C), temperature source Oral, resp. rate 19, height 5\' 6"  (1.676 m), weight 82.6 kg, SpO2 99 %. PHYSICAL EXAMINATION:  Physical Exam  GENERAL:   Sitting up on the edge of the bed, in no acute distress. HEENT: Head atraumatic, normocephalic. Pupils equal, round, reactive to light and accommodation. No scleral icterus. Extraocular muscles intact. Oropharynx and nasopharynx clear.  NECK:  Supple,  no jugular venous distention. No thyroid enlargement. LUNGS: Lungs are clear to auscultation bilaterally. No wheezes, crackles, rhonchi. No use of accessory muscles of respiration.  CARDIOVASCULAR: RRR, S1, S2 normal. No murmurs, rubs, or gallops.  ABDOMEN: Soft, nontender, nondistended. Bowel sounds present.  EXTREMITIES: No pedal edema, cyanosis, or clubbing.  NEUROLOGIC: CN 2-12 intact, no focal deficits. 5/5 muscle strength throughout all extremities. Sensation intact throughout. Gait not checked.  PSYCHIATRIC: The patient is alert and oriented x 3.  SKIN: No obvious rash, lesion, or ulcer.  LABORATORY PANEL:  Female CBC Recent Labs  Lab 01/11/19 0517  WBC 8.6  HGB 10.9*  HCT 32.9*  PLT 189   ------------------------------------------------------------------------------------------------------------------ Chemistries  Recent Labs  Lab 01/10/19 0617 01/11/19 0517  NA 138 137  K 3.4* 3.4*  CL 103 108  CO2 23 22  GLUCOSE 138* 103*  BUN 11 12  CREATININE 1.04* 1.04*  CALCIUM 9.1 8.2*  MG 1.7  --   AST 42*  --   ALT 21  --   ALKPHOS 86  --   BILITOT 0.9  --    RADIOLOGY:  No results found. ASSESSMENT AND PLAN:   Sepsis likely due to viral gastroenteritis- meeting sepsis criteria on admission with fever, tachycardia, tachypnea, and lactic acidosis. Lactic acidosis has resolved -CT abdomen/pelvis was unremarkable -GI pathogen panel was negative -If patient has multiple episodes of diarrhea, will check a C. Diff  -Continue IVFs and IV anti-emetics -Continue flagyl and cefepime, can likely stop if cultures come back negative -Follow-up blood cultures  Hypokalemia- due to above -Replete  and recheck  Recent UTI- started treatment with macrobid a couple of days ago -Continue broad spectrum abx as above -Follow-up urine culture  Essential hypertension- BP normal -Continue coreg and losartan  Paroxysmal SVT- in NSR here -Continue diltiazem  CAD-stable, no  active chest pain.  Follows with Dr. Nehemiah Massed as an outpatient -Continue aspirin, statin, losartan  CKD III- creatinine at baseline -Monitor  All the records are reviewed and case discussed with Care Management/Social Worker. Management plans discussed with the patient, family and they are in agreement.  CODE STATUS: Full Code  TOTAL TIME TAKING CARE OF THIS PATIENT: 40 minutes.   More than 50% of the time was spent in counseling/coordination of care: YES  POSSIBLE D/C IN 1-2 DAYS, DEPENDING ON CLINICAL CONDITION.   Berna Spare Earlyn Sylvan M.D on 01/11/2019 at 1:23 PM  Between 7am to 6pm - Pager - [phone]  After 6pm go to [email or web address] - password [email or web address] [facility]  [employer] [facility]  Office  [phone]  CC: Primary care physician; Lavera Guise, MD  Note: This dictation was prepared with Dragon dictation along with smaller phrase technology. Any transcriptional errors that result from this process are unintentional.

## 2019-01-12 LAB — CBC
HCT: 34.1 % — ABNORMAL LOW (ref 36.0–46.0)
Hemoglobin: 11.2 g/dL — ABNORMAL LOW (ref 12.0–15.0)
MCH: 28.9 pg (ref 26.0–34.0)
MCHC: 32.8 g/dL (ref 30.0–36.0)
MCV: 88.1 fL (ref 80.0–100.0)
Platelets: 194 10*3/uL (ref 150–400)
RBC: 3.87 MIL/uL (ref 3.87–5.11)
RDW: 15.1 % (ref 11.5–15.5)
WBC: 5.8 10*3/uL (ref 4.0–10.5)
nRBC: 0 % (ref 0.0–0.2)

## 2019-01-12 LAB — BASIC METABOLIC PANEL
Anion gap: 7 (ref 5–15)
BUN: 10 mg/dL (ref 8–23)
CO2: 23 mmol/L (ref 22–32)
Calcium: 8.4 mg/dL — ABNORMAL LOW (ref 8.9–10.3)
Chloride: 111 mmol/L (ref 98–111)
Creatinine, Ser: 0.88 mg/dL (ref 0.44–1.00)
GFR calc Af Amer: >60 mL/min (ref >60)
GFR calc non Af Amer: >60 mL/min (ref >60)
Glucose, Bld: 95 mg/dL (ref 70–99)
Potassium: 4.1 mmol/L (ref 3.5–5.1)
Sodium: 141 mmol/L (ref 135–145)

## 2019-01-12 LAB — PROCALCITONIN: Procalcitonin: 1.87 ng/mL

## 2019-01-12 NOTE — Discharge Summary (Signed)
[place] [facility] [place] NAME: Amanda Duke    MR#:  [id number]  DATE OF BIRTH:  1940-05-22  DATE OF ADMISSION:  01/10/2019   ADMITTING PHYSICIAN: Bettey Costa, MD  DATE OF DISCHARGE: 01/12/19  PRIMARY CARE PHYSICIAN: Lavera Guise, MD   ADMISSION DIAGNOSIS:  Nausea vomiting and diarrhea [R11.2, R19.7] Fever, unspecified fever cause [R50.9] Sepsis, due to unspecified organism, unspecified whether acute organ dysfunction present ([place]) [A41.9] DISCHARGE DIAGNOSIS:  Active Problems:   Sepsis ([place])  SECONDARY DIAGNOSIS:   Past Medical History:  Diagnosis Date  . Anxiety   . Coronary artery disease   . GERD (gastroesophageal reflux disease)   . Hyperlipemia   . Hypertension   . Irregular heart beat   . Pre-diabetes   . Prediabetes   . Renal cyst    bilateral   HOSPITAL COURSE:   Amanda Duke is a 78 year old female who presented to the ED with, and diarrhea in the setting of recently being treated for UTI on Macrobid.  In the ED, UA was unremarkable.  She was meeting sepsis criteria with fever 101.5, tachypnea, and tachycardia.  She was admitted for further management.  Sepsis likely due to viral gastroenteritis- meeting sepsis criteria on admission with fever, tachycardia, tachypnea, and lactic acidosis.  Sepsis has resolved. 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  Follows with Dr. Nehemiah Massed as an outpatient -Continued aspirin, statin, losartan  CKD III- creatinine at  baseline -Monitor  DISCHARGE CONDITIONS:  Viral gastroenteritis Recent UTI Hypertension Paroxysmal SVT CAD CKD 3 CONSULTS OBTAINED:  None DRUG ALLERGIES:   Allergies  Allergen Reactions  . Amoxicillin Hives    Patient tolerated Ceftriaxone in ED on 01/10/19.   Marland Kitchen Clindamycin Diarrhea  . Gemfibrozil Other (See Comments)    muscle ache  . Levofloxacin Other (See Comments)  . Levofloxacin In D5w Other (See Comments)  . Metoprolol Other (See Comments)  . Simvastatin Other (See Comments)    muscle ache  . Sulfa Antibiotics Other (See Comments)  . Nystatin-Triamcinolone Rash   DISCHARGE MEDICATIONS:   Allergies as of 01/12/2019      Reactions   Amoxicillin Hives   Patient tolerated Ceftriaxone in ED on 01/10/19.    Clindamycin Diarrhea   Gemfibrozil Other (See Comments)   muscle ache   Levofloxacin Other (See Comments)   Levofloxacin In D5w Other (See Comments)   Metoprolol Other (See Comments)   Simvastatin Other (See Comments)   muscle ache   Sulfa Antibiotics Other (See Comments)   Nystatin-triamcinolone Rash      Medication List    STOP taking these medications   nitrofurantoin (macrocrystal-monohydrate) 100 MG capsule Commonly known as: MACROBID     TAKE these medications   carvedilol 3.125 MG tablet Commonly known as: COREG Take 3.125 mg by mouth 2 (two) times daily with a meal.   cholecalciferol 1000 units tablet Commonly known as: VITAMIN D Take 1,000 Units by mouth daily.   clonazePAM 0.5 MG tablet Commonly known as: KLONOPIN TAKE 1 TABLET BY MOUTH AT BEDTIME AS NEEDED INSOMNIA/ANXIETY   diltiazem 120 MG 24 hr  capsule Commonly known as: CARDIZEM CD Take 120 mg by mouth daily.   fluticasone 50 MCG/ACT nasal spray Commonly known as: FLONASE Place 2 sprays into the nose as needed.   linaclotide 290 MCG Caps capsule Commonly known as: Linzess Take 1 capsule (290 mcg total) by mouth daily.   losartan 100 MG tablet Commonly known as:  COZAAR Take 100 mg by mouth daily.   ondansetron 4 MG tablet Commonly known as: Zofran Take 1 tablet (4 mg total) by mouth every 8 (eight) hours as needed for nausea or vomiting.   pantoprazole 40 MG tablet Commonly known as: PROTONIX Take 40 mg by mouth daily.   rosuvastatin 20 MG tablet Commonly known as: CRESTOR Take 1 tablet (20 mg total) by mouth daily.   Vascepa 1 g Caps Generic drug: Icosapent Ethyl Take 1 g by mouth 2 (two) times daily.        DISCHARGE INSTRUCTIONS:  1.  Follow-up with PCP in 5 days DIET:  Cardiac diet DISCHARGE CONDITION:  Stable ACTIVITY:  Activity as tolerated OXYGEN:  Home Oxygen: No.  Oxygen Delivery: room air DISCHARGE LOCATION:  home   If you experience worsening of your admission symptoms, develop shortness of breath, life threatening emergency, suicidal or homicidal thoughts you must seek medical attention immediately by calling 911 or calling your MD immediately  if symptoms less severe.  You Must read complete instructions/literature along with all the possible adverse reactions/side effects for all the Medicines you take and that have been prescribed to you. Take any new Medicines after you have completely understood and accpet all the possible adverse reactions/side effects.   Please note  You were cared for by a hospitalist during your hospital stay. If you have any questions about your discharge medications or the care you received while you were in the hospital after you are discharged, you can call the unit and asked to speak with the hospitalist on call if the hospitalist that took care of you is not available. Once you are discharged, your primary care physician will handle any further medical issues. Please note that NO REFILLS for any discharge medications will be authorized once you are discharged, as it is imperative that you return to your primary care physician (or establish a relationship with a primary care physician if  you do not have one) for your aftercare needs so that they can reassess your need for medications and monitor your lab values.    On the day of Discharge:  VITAL SIGNS:  Blood pressure 129/64, pulse (!) 59, temperature 98 F (36.7 C), temperature source Oral, resp. rate 15, height 5\' 6"  (1.676 m), weight 82.6 kg, SpO2 100 %. PHYSICAL EXAMINATION:  GENERAL:  78 y.o.-year-old patient walking around the room, in no acute distress EYES: Pupils equal, round, reactive to light and accommodation. No scleral icterus. Extraocular muscles intact.  HEENT: Head atraumatic, normocephalic. Oropharynx and nasopharynx clear.  NECK:  Supple, no jugular venous distention. No thyroid enlargement, no tenderness.  LUNGS: Normal breath sounds bilaterally, no wheezing, rales,rhonchi or crepitation. No use of accessory muscles of respiration.  CARDIOVASCULAR: RRR, S1, S2 normal. No murmurs, rubs, or gallops.  ABDOMEN: Soft, non-tender, non-distended. Bowel sounds present. No organomegaly or mass.  EXTREMITIES: No pedal edema, cyanosis, or clubbing.  NEUROLOGIC: Cranial nerves II through XII are intact. Muscle strength 5/5 in all extremities. Sensation intact. Gait not checked.  PSYCHIATRIC: The patient is alert and oriented x 3.  SKIN: No obvious rash, lesion, or ulcer.  DATA REVIEW:   CBC Recent Labs  Lab 01/12/19 0425  WBC 5.8  HGB 11.2*  HCT 34.1*  PLT 194    Chemistries  Recent Labs  Lab 01/10/19 0617  01/12/19 0425  NA 138   < > 141  K 3.4*   < > 4.1  CL 103   < > 111  CO2 23   < > 23  GLUCOSE 138*   < > 95  BUN 11   < > 10  CREATININE 1.04*   < > 0.88  CALCIUM 9.1   < > 8.4*  MG 1.7  --   --   AST 42*  --   --   ALT 21  --   --   ALKPHOS 86  --   --   BILITOT 0.9  --   --    < > = values in this interval not displayed.     Microbiology Results  Results for orders placed or performed during the hospital encounter of 01/10/19  Blood Culture (routine x 2)     Status: None  (Preliminary result)   Collection Time: 01/10/19  6:36 AM   Specimen: BLOOD  Result Value Ref Range Status   Specimen Description BLOOD LEFT ANTECUBITAL  Final   Special Requests   Final    BOTTLES DRAWN AEROBIC AND ANAEROBIC Blood Culture adequate volume   Culture   Final    NO GROWTH 2 DAYS Performed at [facility], [address] [place], [place] [address]    Report Status PENDING  Incomplete  Blood Culture (routine x 2)     Status: None (Preliminary result)   Collection Time: 01/10/19  7:38 AM   Specimen: BLOOD  Result Value Ref Range Status   Specimen Description BLOOD RAC  Final   Special Requests   Final    BOTTLES DRAWN AEROBIC AND ANAEROBIC Blood Culture results may not be optimal due to an excessive volume of blood received in culture bottles   Culture   Final    NO GROWTH 2 DAYS Performed at [facility], [address] [place], [place] [address]    Report Status PENDING  Incomplete  SARS Coronavirus 2 [facility] order, Performed in [place] hospital lab) Nasopharyngeal Nasopharyngeal Swab     Status: None   Collection Time: 01/10/19  7:44 AM   Specimen: Nasopharyngeal Swab  Result Value Ref Range Status   SARS Coronavirus 2 NEGATIVE NEGATIVE Final    Comment: (NOTE) If result is NEGATIVE SARS-CoV-2 target nucleic acids are NOT DETECTED. The SARS-CoV-2 RNA is generally detectable in upper and lower  respiratory specimens during the acute phase of infection. The lowest  concentration of SARS-CoV-2 viral copies this assay can detect is 250  copies / mL. A negative result does not preclude SARS-CoV-2 infection  and should not be used as the sole basis for treatment or other  patient management decisions.  A negative result may occur with  improper specimen collection / handling, submission of specimen other  than nasopharyngeal swab, presence of viral mutation(s) within the  areas targeted by this assay, and inadequate number of viral copies   (<250 copies / mL). A negative result must be combined with clinical  observations, patient history, and epidemiological information. If result is POSITIVE SARS-CoV-2 target nucleic acids are DETECTED. The SARS-CoV-2 RNA is generally detectable in upper and lower  respiratory specimens dur ing the acute phase of infection.  Positive  results are indicative of  active infection with SARS-CoV-2.  Clinical  correlation with patient history and other diagnostic information is  necessary to determine patient infection status.  Positive results do  not rule out bacterial infection or co-infection with other viruses. If result is PRESUMPTIVE POSTIVE SARS-CoV-2 nucleic acids MAY BE PRESENT.   A presumptive positive result was obtained on the submitted specimen  and confirmed on repeat testing.  While 2019 novel coronavirus  (SARS-CoV-2) nucleic acids may be present in the submitted sample  additional confirmatory testing may be necessary for epidemiological  and / or clinical management purposes  to differentiate between  SARS-CoV-2 and other Sarbecovirus currently known to infect humans.  If clinically indicated additional testing with an alternate test  methodology [phone]) is advised. The SARS-CoV-2 RNA is generally  detectable in upper and lower respiratory sp ecimens during the acute  phase of infection. The expected result is Negative. Fact Sheet for Patients:  StrictlyIdeas.no Fact Sheet for Healthcare Providers: BankingDealers.co.za This test is not yet approved or cleared by the [place] FDA and has been authorized for detection and/or diagnosis of SARS-CoV-2 by FDA under an Emergency Use Authorization (EUA).  This EUA will remain in effect (meaning this test can be used) for the duration of the COVID-19 declaration under Section 564(b)(1) of the Act, 21 U.S.C. section 360bbb-3(b)(1), unless the authorization is terminated  or revoked sooner. Performed at [facility], [address], [place], [place] [address]   Urine Culture     Status: None   Collection Time: 01/10/19  8:22 AM   Specimen: Urine, Clean Catch  Result Value Ref Range Status   Specimen Description   Final    URINE, CLEAN CATCH Performed at [facility], [address] [place], [place] [address]    Special Requests   Final    Normal Performed at [facility], [address], [place], [place] [address]    Culture   Final    NO GROWTH Performed at [facility], [place] [address], [place], [place] [address]    Report Status 01/11/2019 FINAL  Final  Gastrointestinal Panel by PCR , Stool     Status: None   Collection Time: 01/10/19  9:21 PM   Specimen: Stool  Result Value Ref Range Status   Campylobacter species NOT DETECTED NOT DETECTED Final   Plesimonas shigelloides NOT DETECTED NOT DETECTED Final   Salmonella species NOT DETECTED NOT DETECTED Final   Yersinia enterocolitica NOT DETECTED NOT DETECTED Final   Vibrio species NOT DETECTED NOT DETECTED Final   Vibrio cholerae NOT DETECTED NOT DETECTED Final   Enteroaggregative E coli (EAEC) NOT DETECTED NOT DETECTED Final   Enteropathogenic E coli (EPEC) NOT DETECTED NOT DETECTED Final   Enterotoxigenic E coli (ETEC) NOT DETECTED NOT DETECTED Final   Shiga like toxin producing E coli (STEC) NOT DETECTED NOT DETECTED Final   Shigella/Enteroinvasive E coli (EIEC) NOT DETECTED NOT DETECTED Final   Cryptosporidium NOT DETECTED NOT DETECTED Final   Cyclospora cayetanensis NOT DETECTED NOT DETECTED Final   Entamoeba histolytica NOT DETECTED NOT DETECTED Final   Giardia lamblia NOT DETECTED NOT DETECTED Final   Adenovirus F40/41 NOT DETECTED NOT DETECTED Final   Astrovirus NOT DETECTED NOT DETECTED Final   Norovirus GI/GII NOT DETECTED NOT DETECTED Final   Rotavirus A NOT DETECTED NOT DETECTED Final   Sapovirus (I, II, IV, and V) NOT DETECTED NOT DETECTED  Final    Comment: Performed at [facility], [address] [place], [place] [address]    RADIOLOGY:  No results found.   Management plans discussed with the patient, family and they are in agreement.  CODE STATUS: Full Code   TOTAL TIME TAKING CARE OF THIS PATIENT: 45 minutes.    Berna Spare Shatoya Roets M.D on 01/12/2019 at 10:37 AM  Between 7am to 6pm - Pager - [phone]  After 6pm go to [email or web address] - password [email or web address] [facility]  [facility]  Office  [phone]  CC: Primary care physician; Lavera Guise, MD   Note: This dictation was prepared with Dragon dictation along with smaller phrase technology. Any transcriptional errors that result from this process are unintentional.

## 2019-01-12 NOTE — Progress Notes (Signed)
Patient IV was removed.Education was wa done , and patient verbally understood.

## 2019-01-12 NOTE — Discharge Instructions (Signed)
It was so nice to meet you during this hospitalization!  You came into the hospital with nausea, vomiting, and diarrhea. You were showing signs of sepsis when you came in, but this resolved. Your blood and urine cultures did not grow any bacteria. I think you probably have a viral infection of your stomach and intestines.  You do not need any antibiotics when you go home. Please make sure you are drinking plenty of water.  Take care, Dr. Brett Albino

## 2019-01-12 NOTE — Plan of Care (Signed)

## 2019-01-13 ENCOUNTER — Ambulatory Visit (INDEPENDENT_AMBULATORY_CARE_PROVIDER_SITE_OTHER): Payer: Medicare Other | Admitting: Adult Health

## 2019-01-13 ENCOUNTER — Other Ambulatory Visit: Payer: Self-pay

## 2019-01-13 ENCOUNTER — Encounter: Payer: Self-pay | Admitting: Adult Health

## 2019-01-13 VITALS — BP 144/76 | HR 75 | Temp 96.9°F | Resp 16 | Ht 65.0 in | Wt 182.0 lb

## 2019-01-13 DIAGNOSIS — I10 Essential (primary) hypertension: Secondary | ICD-10-CM

## 2019-01-13 DIAGNOSIS — K529 Noninfective gastroenteritis and colitis, unspecified: Secondary | ICD-10-CM | POA: Diagnosis not present

## 2019-01-13 DIAGNOSIS — N390 Urinary tract infection, site not specified: Secondary | ICD-10-CM

## 2019-01-13 DIAGNOSIS — F411 Generalized anxiety disorder: Secondary | ICD-10-CM | POA: Diagnosis not present

## 2019-01-13 NOTE — Progress Notes (Signed)
[facility] [place], [place] [address]  Internal MEDICINE  Office Visit Note  Patient Name: Amanda Duke  [id number]  [id number]  Date of Service: 01/13/2019     Chief Complaint  Patient presents with  . [facility] Management [facility] follow up sepsis      HPI Pt is here for recent hospital follow up.  Pt reports she went to the hospital early Sunday morning due to not feeling well, diarrhea and urinary incontinence.  She was admitted due to sepsis, she was being treated for a UTI, but urine was checked at hospital and was clear.  They did labs and found the patient to be septic.  She was given antibiotics and later the theory was she had an intestinal infection. Her potassium was treated when admitted, and she was not discharged with any antibiotics.  She reports she is feeling better at this time.  She is reporting some hip pain that was present before going to hospital, and she believes laying the bed while admitted made it worse.       Current Medication: Outpatient Encounter Medications as of 01/13/2019  Medication Sig  . carvedilol (COREG) 3.125 MG tablet Take 3.125 mg by mouth 2 (two) times daily with a meal.   . cholecalciferol (VITAMIN D) 1000 units tablet Take 1,000 Units by mouth daily.  . clonazePAM (KLONOPIN) 0.5 MG tablet TAKE 1 TABLET BY MOUTH AT BEDTIME AS NEEDED INSOMNIA/ANXIETY  . diltiazem (CARDIZEM CD) 120 MG 24 hr capsule Take 120 mg by mouth daily.   . fluticasone (FLONASE) 50 MCG/ACT nasal spray Place 2 sprays into the nose as needed.   Vanessa Kick Ethyl (VASCEPA) 1 g CAPS Take 1 g by mouth 2 (two) times daily.   Marland Kitchen linaclotide (LINZESS) 290 MCG CAPS capsule Take 1 capsule (290 mcg total) by mouth daily.  Marland Kitchen losartan (COZAAR) 100 MG tablet Take 100 mg by mouth daily.  . ondansetron (ZOFRAN) 4 MG tablet Take 1 tablet (4 mg total) by mouth every 8 (eight) hours as needed for nausea or vomiting.  . pantoprazole  (PROTONIX) 40 MG tablet Take 40 mg by mouth daily.   . rosuvastatin (CRESTOR) 20 MG tablet Take 1 tablet (20 mg total) by mouth daily.   No facility-administered encounter medications on file as of 01/13/2019.     Surgical History: Past Surgical History:  Procedure Laterality Date  . APPENDECTOMY  1971  . ARTHRODESIS  1980  . BACK SURGERY  1980  . COLONOSCOPY WITH PROPOFOL N/A 12/12/2016   Procedure: COLONOSCOPY WITH PROPOFOL;  Surgeon: Lollie Sails, MD;  Location: [facility] ENDOSCOPY;  Service: Endoscopy;  Laterality: N/A;  . ESOPHAGOGASTRODUODENOSCOPY (EGD) WITH PROPOFOL N/A 12/12/2016   Procedure: ESOPHAGOGASTRODUODENOSCOPY (EGD) WITH PROPOFOL;  Surgeon: Lollie Sails, MD;  Location: [facility] ENDOSCOPY;  Service: Endoscopy;  Laterality: N/A;  . [place]   right  . TONSILLECTOMY  1948  . TUBAL LIGATION  1971  . VAGINAL HYSTERECTOMY  1985   d/t heavy bleeding    Medical History: Past Medical History:  Diagnosis Date  . Anxiety   . Coronary artery disease   . GERD (gastroesophageal reflux disease)   . Hyperlipemia   . Hypertension   . Irregular heart beat   . Pre-diabetes   . Prediabetes   . Renal cyst    bilateral    Family History: Family History  Problem Relation Age of Onset  .  Heart failure Mother   . Stroke Mother   . Breast cancer Mother        dx at age 105 yo  . Diabetes Mother   . Osteoporosis Mother   . Emphysema Father   . Ovarian cancer Sister        dx at age 49 yo  . Asthma Sister     Social History   Socioeconomic History  . Marital status: Widowed    Spouse name: Not on file  . Number of children: Not on file  . Years of education: Not on file  . Highest education level: Not on file  Occupational History  . Not on file  Social Needs  . Financial resource strain: Not hard at all  . Food insecurity    Worry: Never true    Inability: Never true  . Transportation needs    Medical: No     Non-medical: No  Tobacco Use  . Smoking status: Former Research scientist (life sciences)  . Smokeless tobacco: Never Used  Substance and Sexual Activity  . Alcohol use: No    Alcohol/week: 0.0 standard drinks  . Drug use: Not Currently  . Sexual activity: Not Currently  Lifestyle  . Physical activity    Days per week: 0 days    Minutes per session: 0 min  . Stress: Not at all  Relationships  . Social Herbalist on phone: Three times a week    Gets together: Three times a week    Attends religious service: Never    Active member of club or organization: No    Attends meetings of clubs or organizations: Never    Relationship status: Widowed  . Intimate partner violence    Fear of current or ex partner: No    Emotionally abused: No    Physically abused: No    Forced sexual activity: No  Other Topics Concern  . Not on file  Social History Narrative  . Not on file      Review of Systems  Constitutional: Negative for chills, fatigue and unexpected weight change.  HENT: Negative for congestion, rhinorrhea, sneezing and sore throat.   Eyes: Negative for photophobia, pain and redness.  Respiratory: Negative for cough, chest tightness and shortness of breath.   Cardiovascular: Negative for chest pain and palpitations.  Gastrointestinal: Negative for abdominal pain, constipation, diarrhea, nausea and vomiting.  Endocrine: Negative.   Genitourinary: Negative for dysuria and frequency.  Musculoskeletal: Negative for arthralgias, back pain, joint swelling and neck pain.  Skin: Negative for rash.  Allergic/Immunologic: Negative.   Neurological: Negative for tremors and numbness.  Hematological: Negative for adenopathy. Does not bruise/bleed easily.  Psychiatric/Behavioral: Negative for behavioral problems and sleep disturbance. The patient is not nervous/anxious.     Vital Signs: BP (!) 144/76   Pulse 75   Temp (!) 96.9 F (36.1 C)   Resp 16   Ht 5\' 5"  (1.651 m)   Wt 182 lb (82.6 kg)    SpO2 96%   BMI 30.29 kg/m    Physical Exam Vitals signs and nursing note reviewed.  Constitutional:      General: She is not in acute distress.    Appearance: She is well-developed. She is not diaphoretic.  HENT:     Head: Normocephalic and atraumatic.     Mouth/Throat:     Pharynx: No oropharyngeal exudate.  Eyes:     Pupils: Pupils are equal, round, and reactive to light.  Neck:  Musculoskeletal: Normal range of motion and neck supple.     Thyroid: No thyromegaly.     Vascular: No JVD.     Trachea: No tracheal deviation.  Cardiovascular:     Rate and Rhythm: Normal rate and regular rhythm.     Heart sounds: Normal heart sounds. No murmur. No friction rub. No gallop.   Pulmonary:     Effort: Pulmonary effort is normal. No respiratory distress.     Breath sounds: Normal breath sounds. No wheezing or rales.  Chest:     Chest wall: No tenderness.  Abdominal:     Palpations: Abdomen is soft.     Tenderness: There is no abdominal tenderness. There is no guarding.  Musculoskeletal: Normal range of motion.  Lymphadenopathy:     Cervical: No cervical adenopathy.  Skin:    General: Skin is warm and dry.  Neurological:     Mental Status: She is alert and oriented to person, place, and time.     Cranial Nerves: No cranial nerve deficit.  Psychiatric:        Behavior: Behavior normal.        Thought Content: Thought content normal.        Judgment: Judgment normal.    Assessment/Plan: 1. Gastroenteritis Likely cause of patients sepsis,   2. Benign essential HTN Slightly elevated today, 144/76, will continue to monitor at future.   3. Urinary tract infection without hematuria, site unspecified Resolved, continue to monitor for symptoms  4. Generalized anxiety disorder Stable, continue present management.   General Counseling: Yanin verbalizes understanding of the findings of todays visit and agrees with plan of treatment. I have discussed any further diagnostic  evaluation that may be needed or ordered today. We also reviewed her medications today. she has been encouraged to call the office with any questions or concerns that should arise related to todays visit.    No orders of the defined types were placed in this encounter.   I have reviewed all medical records from hospital follow up including radiology reports and consults from other physicians. Appropriate follow up diagnostics will be scheduled as needed. Patient/ Family understands the plan of treatment.   Time spent 30 minutes.   Orson Gear AGNP-C Internal Medicine

## 2019-01-15 LAB — CULTURE, BLOOD (ROUTINE X 2)
Culture: NO GROWTH
Culture: NO GROWTH
Special Requests: ADEQUATE

## 2019-01-21 ENCOUNTER — Other Ambulatory Visit: Payer: Self-pay

## 2019-01-21 NOTE — Patient Outreach (Signed)
[place] [facility]) Care Management  01/21/2019  Amanda Duke [birth year] [id number]   Medication Adherence call to Amanda Duke Telephone call to Patient regarding Medication Adherence unable to reach patient. Amanda Duke is showing past due on Rosuvastatin 20 mg under [place].   [place] Management Direct Dial [phone]  Fax [phone] [email or web address]

## 2019-01-22 DIAGNOSIS — E782 Mixed hyperlipidemia: Secondary | ICD-10-CM | POA: Diagnosis not present

## 2019-01-22 DIAGNOSIS — I2510 Atherosclerotic heart disease of native coronary artery without angina pectoris: Secondary | ICD-10-CM | POA: Diagnosis not present

## 2019-01-22 DIAGNOSIS — I10 Essential (primary) hypertension: Secondary | ICD-10-CM | POA: Diagnosis not present

## 2019-01-22 DIAGNOSIS — I471 Supraventricular tachycardia: Secondary | ICD-10-CM | POA: Diagnosis not present

## 2019-02-04 ENCOUNTER — Other Ambulatory Visit: Payer: Self-pay

## 2019-02-04 NOTE — Patient Outreach (Signed)
[place] [facility]) Care Management  02/04/2019  Amanda Duke March 28, 1941 [id number]   Medication Adherence call to Amanda Duke Telephone call to Patient regarding Medication Adherence unable to reach patient. Amanda Duke is showing past due on Rosuvastatin 20 mg under [place].   [place] Management Direct Dial [phone]  Fax [phone] [email or web address]

## 2019-02-15 DIAGNOSIS — M85851 Other specified disorders of bone density and structure, right thigh: Secondary | ICD-10-CM | POA: Diagnosis not present

## 2019-02-15 DIAGNOSIS — M8589 Other specified disorders of bone density and structure, multiple sites: Secondary | ICD-10-CM | POA: Diagnosis not present

## 2019-02-15 DIAGNOSIS — Z780 Asymptomatic menopausal state: Secondary | ICD-10-CM | POA: Diagnosis not present

## 2019-02-15 DIAGNOSIS — M8588 Other specified disorders of bone density and structure, other site: Secondary | ICD-10-CM | POA: Diagnosis not present

## 2019-02-15 DIAGNOSIS — M810 Age-related osteoporosis without current pathological fracture: Secondary | ICD-10-CM | POA: Diagnosis not present

## 2019-02-15 DIAGNOSIS — Z1231 Encounter for screening mammogram for malignant neoplasm of breast: Secondary | ICD-10-CM | POA: Diagnosis not present

## 2019-02-22 ENCOUNTER — Telehealth: Payer: Self-pay

## 2019-02-23 NOTE — Telephone Encounter (Signed)
Ok. Will do

## 2019-02-23 NOTE — Telephone Encounter (Signed)
Pt has slight more bone loss than the last time, Will discuss further when she comes for follow up, she should take calcium (1200) and Vit D 800 Iu or more every day( might need further RX) hb

## 2019-02-24 NOTE — Telephone Encounter (Signed)
Pt advised for BMD shows slight more bone loss than the last time take calcium 1200 and vitamin D 800 IU  Or more every day we can discuss in detailed at follow up might need RX

## 2019-03-10 DIAGNOSIS — H2513 Age-related nuclear cataract, bilateral: Secondary | ICD-10-CM | POA: Diagnosis not present

## 2019-04-01 DIAGNOSIS — R1012 Left upper quadrant pain: Secondary | ICD-10-CM | POA: Diagnosis not present

## 2019-04-01 DIAGNOSIS — R1013 Epigastric pain: Secondary | ICD-10-CM | POA: Diagnosis not present

## 2019-04-01 DIAGNOSIS — Z86010 Personal history of colonic polyps: Secondary | ICD-10-CM | POA: Diagnosis not present

## 2019-04-02 ENCOUNTER — Telehealth: Payer: Self-pay

## 2019-04-02 NOTE — Telephone Encounter (Signed)
CONFIRMED AS A VIRTUAL APPT ON 04-06-19.

## 2019-04-06 ENCOUNTER — Ambulatory Visit (INDEPENDENT_AMBULATORY_CARE_PROVIDER_SITE_OTHER): Payer: Medicare Other | Admitting: Nurse Practitioner

## 2019-04-06 ENCOUNTER — Other Ambulatory Visit: Payer: Self-pay

## 2019-04-06 ENCOUNTER — Encounter: Payer: Self-pay | Admitting: Nurse Practitioner

## 2019-04-06 VITALS — BP 147/78 | HR 81 | Resp 16 | Ht 64.0 in | Wt 181.0 lb

## 2019-04-06 DIAGNOSIS — M8589 Other specified disorders of bone density and structure, multiple sites: Secondary | ICD-10-CM | POA: Diagnosis not present

## 2019-04-06 DIAGNOSIS — I10 Essential (primary) hypertension: Secondary | ICD-10-CM

## 2019-04-06 DIAGNOSIS — E782 Mixed hyperlipidemia: Secondary | ICD-10-CM | POA: Diagnosis not present

## 2019-04-06 DIAGNOSIS — F411 Generalized anxiety disorder: Secondary | ICD-10-CM

## 2019-04-06 MED ORDER — ROSUVASTATIN CALCIUM 20 MG PO TABS: 20.0000 mg | ORAL_TABLET | Freq: Every day | ORAL | 3 refills | Status: DC

## 2019-04-06 MED ORDER — CLONAZEPAM 0.5 MG PO TABS: ORAL_TABLET | ORAL | 3 refills | Status: DC

## 2019-04-06 NOTE — Progress Notes (Signed)
[facility] [place], [place] [address]  Internal MEDICINE  Telephone Visit  Patient Name: Amanda Duke  [id number]  [id number]  Date of Service: 04/06/2019  I connected with the patient at 10:50am by telephone and verified the patients identity using two identifiers.   I discussed the limitations, risks, security and privacy concerns of performing an evaluation and management service by telephone and the availability of in person appointments. I also discussed with the patient that there may be a patient responsible charge related to the service.  The patient expressed understanding and agrees to proceed.    Chief Complaint  Patient presents with  . Telephone Assessment  . Telephone Screen  . Hyperlipidemia  . Hypertension    The patient has been contacted via telephone for follow up visit due to concerns for spread of novel coronavirus. The patient states she has been having some trouble with her left hip and leg. She states that she is able to take tylenol for this and rest which improves the pain.  She had bone dnesity test since her most recent visit. This did show a general decrease in bone mass. She has generalized osteopenia. She does take calcium and vitamin d every day. She Is physically active. She also had a negative mammogram since her last visit.       Current Medication: Outpatient Encounter Medications as of 04/06/2019  Medication Sig  . carvedilol (COREG) 3.125 MG tablet Take 3.125 mg by mouth 2 (two) times daily with a meal.   . cholecalciferol (VITAMIN D) 1000 units tablet Take 1,000 Units by mouth daily.  . clonazePAM (KLONOPIN) 0.5 MG tablet TAKE 1 TABLET BY MOUTH AT BEDTIME AS NEEDED INSOMNIA/ANXIETY  . fluticasone (FLONASE) 50 MCG/ACT nasal spray Place 2 sprays into the nose as needed.   Vanessa Kick Ethyl (VASCEPA) 1 g CAPS Take 1 g by mouth 2 (two) times daily.   Marland Kitchen linaclotide (LINZESS) 290 MCG CAPS capsule Take 1 capsule (290 mcg  total) by mouth daily.  Marland Kitchen losartan (COZAAR) 100 MG tablet Take 100 mg by mouth daily.  . ondansetron (ZOFRAN) 4 MG tablet Take 1 tablet (4 mg total) by mouth every 8 (eight) hours as needed for nausea or vomiting.  . pantoprazole (PROTONIX) 40 MG tablet Take 40 mg by mouth daily.   . rosuvastatin (CRESTOR) 20 MG tablet Take 1 tablet (20 mg total) by mouth daily.  . [DISCONTINUED] clonazePAM (KLONOPIN) 0.5 MG tablet TAKE 1 TABLET BY MOUTH AT BEDTIME AS NEEDED INSOMNIA/ANXIETY  . [DISCONTINUED] rosuvastatin (CRESTOR) 20 MG tablet Take 1 tablet (20 mg total) by mouth daily.  Marland Kitchen diltiazem (CARDIZEM CD) 120 MG 24 hr capsule Take 120 mg by mouth daily.    No facility-administered encounter medications on file as of 04/06/2019.     Surgical History: Past Surgical History:  Procedure Laterality Date  . APPENDECTOMY  1971  . ARTHRODESIS  1980  . BACK SURGERY  1980  . COLONOSCOPY WITH PROPOFOL N/A 12/12/2016   Procedure: COLONOSCOPY WITH PROPOFOL;  Surgeon: Lollie Sails, MD;  Location: [facility] ENDOSCOPY;  Service: Endoscopy;  Laterality: N/A;  . ESOPHAGOGASTRODUODENOSCOPY (EGD) WITH PROPOFOL N/A 12/12/2016   Procedure: ESOPHAGOGASTRODUODENOSCOPY (EGD) WITH PROPOFOL;  Surgeon: Lollie Sails, MD;  Location: [facility] ENDOSCOPY;  Service: Endoscopy;  Laterality: N/A;  . [place]   right  . TONSILLECTOMY  1948  . TUBAL LIGATION  1971  . VAGINAL HYSTERECTOMY  1985   d/t heavy  bleeding    Medical History: Past Medical History:  Diagnosis Date  . Anxiety   . Coronary artery disease   . GERD (gastroesophageal reflux disease)   . Hyperlipemia   . Hypertension   . Irregular heart beat   . Pre-diabetes   . Prediabetes   . Renal cyst    bilateral    Family History: Family History  Problem Relation Age of Onset  . Heart failure Mother   . Stroke Mother   . Breast cancer Mother        dx at age 31 yo  . Diabetes Mother   . Osteoporosis Mother    . Emphysema Father   . Ovarian cancer Sister        dx at age 58 yo  . Asthma Sister     Social History   Socioeconomic History  . Marital status: Widowed    Spouse name: Not on file  . Number of children: Not on file  . Years of education: Not on file  . Highest education level: Not on file  Occupational History  . Not on file  Social Needs  . Financial resource strain: Not hard at all  . Food insecurity    Worry: Never true    Inability: Never true  . Transportation needs    Medical: No    Non-medical: No  Tobacco Use  . Smoking status: Former Research scientist (life sciences)  . Smokeless tobacco: Never Used  Substance and Sexual Activity  . Alcohol use: No    Alcohol/week: 0.0 standard drinks  . Drug use: Not Currently  . Sexual activity: Not Currently  Lifestyle  . Physical activity    Days per week: 0 days    Minutes per session: 0 min  . Stress: Not at all  Relationships  . Social Herbalist on phone: Three times a week    Gets together: Three times a week    Attends religious service: Never    Active member of club or organization: No    Attends meetings of clubs or organizations: Never    Relationship status: Widowed  . Intimate partner violence    Fear of current or ex partner: No    Emotionally abused: No    Physically abused: No    Forced sexual activity: No  Other Topics Concern  . Not on file  Social History Narrative  . Not on file      Review of Systems  Constitutional: Negative for chills, fatigue and unexpected weight change.  HENT: Negative for congestion, postnasal drip, rhinorrhea, sneezing and sore throat.   Respiratory: Negative for cough, chest tightness, shortness of breath and wheezing.   Cardiovascular: Negative for chest pain and palpitations.  Gastrointestinal: Negative for abdominal pain, constipation, diarrhea, nausea and vomiting.  Endocrine: Negative for cold intolerance, heat intolerance, polydipsia and polyuria.  Genitourinary:  Negative for urgency.  Musculoskeletal: Positive for arthralgias and myalgias. Negative for back pain, joint swelling and neck pain.       Left hip and leg tenderness. Worse with exertion.   Skin: Negative for rash.  Allergic/Immunologic: Negative for environmental allergies.  Neurological: Negative for tremors, numbness and headaches.  Hematological: Negative for adenopathy. Does not bruise/bleed easily.  Psychiatric/Behavioral: Negative for behavioral problems (Depression), sleep disturbance and suicidal ideas. The patient is nervous/anxious.     Today's Vitals   04/06/19 1019  BP: (!) 147/78  Pulse: 81  Resp: 16  SpO2: 97%  Weight: 181 lb (82.1 kg)  Height: 5\' 4"  (1.626 m)   Body mass index is 31.07 kg/m.  Observation/Objective:   The patient is alert and oriented. She is pleasant and answers all questions appropriately. Breathing is non-labored. She is in no acute distress at this time.    Assessment/Plan: 1. Benign essential HTN Blood pressure generally stable. Continue bp medication as prescribed   2. Mixed hyperlipidemia Continue rosuvastatin as prescribed. Patient has lab slip to check routine fasting labs. Will adjust medication as indicated.  - rosuvastatin (CRESTOR) 20 MG tablet; Take 1 tablet (20 mg total) by mouth daily.  Dispense: 30 tablet; Refill: 3  3. Osteopenia of multiple sites Reviewed recent bone density test. She does have generalized osteopenia. She will continue calcium and vitamin d every day and she will continue to be physically active.   4. Generalized anxiety disorder May continue clonazepam 0.5mg  at bedtime as needed. New prescription sent to her pharmacy today.  - clonazePAM (KLONOPIN) 0.5 MG tablet; TAKE 1 TABLET BY MOUTH AT BEDTIME AS NEEDED INSOMNIA/ANXIETY  Dispense: 30 tablet; Refill: 3  General Counseling: Lashya verbalizes understanding of the findings of today's phone visit and agrees with plan of treatment. I have discussed any  further diagnostic evaluation that may be needed or ordered today. We also reviewed her medications today. she has been encouraged to call the office with any questions or concerns that should arise related to todays visit.  This patient was seen by [place] with Dr Lavera Guise as a part of collaborative care agreement  Meds ordered this encounter  Medications  . clonazePAM (KLONOPIN) 0.5 MG tablet    Sig: TAKE 1 TABLET BY MOUTH AT BEDTIME AS NEEDED INSOMNIA/ANXIETY    Dispense:  30 tablet    Refill:  3    Order Specific Question:   Supervising Provider    Answer:   Lavera Guise [place]  . rosuvastatin (CRESTOR) 20 MG tablet    Sig: Take 1 tablet (20 mg total) by mouth daily.    Dispense:  30 tablet    Refill:  3    Order Specific Question:   Supervising Provider    Answer:   Lavera Guise [id number]    Time spent: 36 Minutes    Dr Lavera Guise Internal medicine

## 2019-06-22 ENCOUNTER — Encounter: Payer: Self-pay | Admitting: Nurse Practitioner

## 2019-06-22 ENCOUNTER — Ambulatory Visit (INDEPENDENT_AMBULATORY_CARE_PROVIDER_SITE_OTHER): Payer: Medicare Other | Admitting: Nurse Practitioner

## 2019-06-22 VITALS — BP 155/87 | Temp 97.4°F | Ht 65.0 in | Wt 182.0 lb

## 2019-06-22 DIAGNOSIS — M25552 Pain in left hip: Secondary | ICD-10-CM | POA: Diagnosis not present

## 2019-06-22 DIAGNOSIS — I10 Essential (primary) hypertension: Secondary | ICD-10-CM | POA: Diagnosis not present

## 2019-06-22 MED ORDER — METHYLPREDNISOLONE 4 MG PO TBPK: ORAL_TABLET | ORAL | 0 refills | Status: DC

## 2019-06-22 MED ORDER — IBUPROFEN 600 MG PO TABS: 600.0000 mg | ORAL_TABLET | Freq: Two times a day (BID) | ORAL | 0 refills | Status: DC | PRN

## 2019-06-22 NOTE — Progress Notes (Signed)
[facility] [place], [place] [address]  Internal MEDICINE  Telephone Visit  Patient Name: Amanda Duke  [id number]  [id number]  Date of Service: 06/27/2019  I connected with the patient at 5:09pm by telephone and verified the patients identity using two identifiers.   I discussed the limitations, risks, security and privacy concerns of performing an evaluation and management service by telephone and the availability of in person appointments. I also discussed with the patient that there may be a patient responsible charge related to the service.  The patient expressed understanding and agrees to proceed.    Chief Complaint  Patient presents with  . Telephone Screen  . Telephone Assessment  . Hip Pain    right its going on for 1 month     The patient has been contacted via telephone for follow up visit due to concerns for spread of novel coronavirus. The patient states that her hip is really hurting, mostly down the side of her left leg. She states that it has been waxing and waning for past few weeks. Today, this is the worst it has been. She has not fallen. She states that she has nut hurt it at any point. She has taken some tylenol. She states this has helped a little.       Current Medication: Outpatient Encounter Medications as of 06/22/2019  Medication Sig  . carvedilol (COREG) 3.125 MG tablet Take 3.125 mg by mouth 2 (two) times daily with a meal.   . cholecalciferol (VITAMIN D) 1000 units tablet Take 1,000 Units by mouth daily.  . clonazePAM (KLONOPIN) 0.5 MG tablet TAKE 1 TABLET BY MOUTH AT BEDTIME AS NEEDED INSOMNIA/ANXIETY  . fluticasone (FLONASE) 50 MCG/ACT nasal spray Place 2 sprays into the nose as needed.   Vanessa Kick Ethyl (VASCEPA) 1 g CAPS Take 1 g by mouth 2 (two) times daily.   Marland Kitchen linaclotide (LINZESS) 290 MCG CAPS capsule Take 1 capsule (290 mcg total) by mouth daily.  Marland Kitchen losartan (COZAAR) 100 MG tablet Take 100 mg by mouth daily.  .  ondansetron (ZOFRAN) 4 MG tablet Take 1 tablet (4 mg total) by mouth every 8 (eight) hours as needed for nausea or vomiting.  . pantoprazole (PROTONIX) 40 MG tablet Take 40 mg by mouth daily.   . rosuvastatin (CRESTOR) 20 MG tablet Take 1 tablet (20 mg total) by mouth daily.  Marland Kitchen diltiazem (CARDIZEM CD) 120 MG 24 hr capsule Take 120 mg by mouth daily.   Marland Kitchen ibuprofen (ADVIL) 600 MG tablet Take 1 tablet (600 mg total) by mouth 2 (two) times daily as needed.  . methylPREDNISolone (MEDROL) 4 MG TBPK tablet Take by mouth as directed for 6 days   No facility-administered encounter medications on file as of 06/22/2019.    Surgical History: Past Surgical History:  Procedure Laterality Date  . APPENDECTOMY  1971  . ARTHRODESIS  1980  . BACK SURGERY  1980  . COLONOSCOPY WITH PROPOFOL N/A 12/12/2016   Procedure: COLONOSCOPY WITH PROPOFOL;  Surgeon: Lollie Sails, MD;  Location: [facility] ENDOSCOPY;  Service: Endoscopy;  Laterality: N/A;  . ESOPHAGOGASTRODUODENOSCOPY (EGD) WITH PROPOFOL N/A 12/12/2016   Procedure: ESOPHAGOGASTRODUODENOSCOPY (EGD) WITH PROPOFOL;  Surgeon: Lollie Sails, MD;  Location: [facility] ENDOSCOPY;  Service: Endoscopy;  Laterality: N/A;  . [place]   right  . TONSILLECTOMY  1948  . TUBAL LIGATION  1971  . VAGINAL HYSTERECTOMY  1985   d/t heavy bleeding  Medical History: Past Medical History:  Diagnosis Date  . Anxiety   . Coronary artery disease   . GERD (gastroesophageal reflux disease)   . Hyperlipemia   . Hypertension   . Irregular heart beat   . Pre-diabetes   . Prediabetes   . Renal cyst    bilateral    Family History: Family History  Problem Relation Age of Onset  . Heart failure Mother   . Stroke Mother   . Breast cancer Mother        dx at age 22 yo  . Diabetes Mother   . Osteoporosis Mother   . Emphysema Father   . Ovarian cancer Sister        dx at age 52 yo  . Asthma Sister     Social History    Socioeconomic History  . Marital status: Widowed    Spouse name: Not on file  . Number of children: Not on file  . Years of education: Not on file  . Highest education level: Not on file  Occupational History  . Not on file  Tobacco Use  . Smoking status: Former Research scientist (life sciences)  . Smokeless tobacco: Never Used  Substance and Sexual Activity  . Alcohol use: No    Alcohol/week: 0.0 standard drinks  . Drug use: Not Currently  . Sexual activity: Not Currently  Other Topics Concern  . Not on file  Social History Narrative  . Not on file   Social Determinants of Health   Financial Resource Strain: Low Risk   . Difficulty of Paying Living Expenses: Not hard at all  Food Insecurity: No Food Insecurity  . Worried About Charity fundraiser in the Last Year: Never true  . Ran Out of Food in the Last Year: Never true  Transportation Needs: No Transportation Needs  . Lack of Transportation (Medical): No  . Lack of Transportation (Non-Medical): No  Physical Activity: Inactive  . Days of Exercise per Week: 0 days  . Minutes of Exercise per Session: 0 min  Stress: No Stress Concern Present  . Feeling of Stress : Not at all  Social Connections: Moderately Isolated  . Frequency of Communication with Friends and Family: Three times a week  . Frequency of Social Gatherings with Friends and Family: Three times a week  . Attends Religious Services: Never  . Active Member of Clubs or Organizations: No  . Attends Archivist Meetings: Never  . Marital Status: Widowed  Intimate Partner Violence: Not At Risk  . Fear of Current or Ex-Partner: No  . Emotionally Abused: No  . Physically Abused: No  . Sexually Abused: No      Review of Systems  Constitutional: Positive for activity change. Negative for chills, fatigue and unexpected weight change.       The patient is not walking around too much or doing too much physical activity due to pain in her left leg.   HENT: Negative for  congestion, postnasal drip, rhinorrhea, sneezing and sore throat.   Respiratory: Negative for cough, chest tightness, shortness of breath and wheezing.   Cardiovascular: Negative for chest pain and palpitations.  Gastrointestinal: Negative for abdominal pain, constipation, diarrhea, nausea and vomiting.  Musculoskeletal: Positive for arthralgias and gait problem. Negative for back pain, joint swelling and neck pain.       The patient states that she is having moderate left hip and leg pain. Hurts to sit or lay down on the left side. Having a hard time  walking due to pain in the left hip.   Skin: Negative for rash.  Neurological: Negative for dizziness, tremors, numbness and headaches.  Hematological: Negative for adenopathy. Does not bruise/bleed easily.  Psychiatric/Behavioral: Negative for behavioral problems (Depression), dysphoric mood, sleep disturbance and suicidal ideas. The patient is not nervous/anxious.     Today's Vitals   06/22/19 1620  BP: (!) 155/87  Temp: (!) 97.4 F (36.3 C)  Weight: 182 lb (82.6 kg)  Height: 5\' 5"  (1.651 m)   Body mass index is 30.29 kg/m.   Observation/Objective:   The patient is alert and oriented. She is pleasant and answers all questions appropriately. Breathing is non-labored. She is in no acute distress at this time.    Assessment/Plan: 1. Left hip pain New prescription for ibuprofen 600mg  may be taken twice daily s needed for pain/inflammation. Add medrol dose pack. Take as directed for 6 days. Will get x-ray of left hip for further evaluation. Consider referral to orthopedics as indicated, - methylPREDNISolone (MEDROL) 4 MG TBPK tablet; Take by mouth as directed for 6 days  Dispense: 21 tablet; Refill: 0 - ibuprofen (ADVIL) 600 MG tablet; Take 1 tablet (600 mg total) by mouth 2 (two) times daily as needed.  Dispense: 45 tablet; Refill: 0 - DG Hip Unilat W OR W/O Pelvis 2-3 Views Left; Future  2. Benign essential HTN Stable. Continue bp  medication as prescribed   General Counseling: Tac[place] verbalizes understanding of the findings of today's phone visit and agrees with plan of treatment. I have discussed any further diagnostic evaluation that may be needed or ordered today. We also reviewed her medications today. she has been encouraged to call the office with any questions or concerns that should arise related to todays visit.  This patient was seen by Leretha Pol FNP Collaboration with Dr Lavera Guise as a part of collaborative care agreement  Orders Placed This Encounter  Procedures  . DG Hip Unilat W OR W/O Pelvis 2-3 Views Left    Meds ordered this encounter  Medications  . methylPREDNISolone (MEDROL) 4 MG TBPK tablet    Sig: Take by mouth as directed for 6 days    Dispense:  21 tablet    Refill:  0    Order Specific Question:   Supervising Provider    Answer:   Lavera Guise [place]  . ibuprofen (ADVIL) 600 MG tablet    Sig: Take 1 tablet (600 mg total) by mouth 2 (two) times daily as needed.    Dispense:  45 tablet    Refill:  0    Order Specific Question:   Supervising Provider    Answer:   Lavera Guise [id number]    Time spent: 35 Minutes    Dr Lavera Guise Internal medicine

## 2019-06-23 ENCOUNTER — Ambulatory Visit
Admission: RE | Admit: 2019-06-23 | Discharge: 2019-06-23 | Disposition: A | Discharge status: Home / Self Care | Payer: Medicare Other | Attending: Nurse Practitioner | Admitting: Nurse Practitioner

## 2019-06-23 ENCOUNTER — Other Ambulatory Visit: Payer: Self-pay

## 2019-06-23 ENCOUNTER — Ambulatory Visit
Admission: RE | Admit: 2019-06-23 | Discharge: 2019-06-23 | Disposition: A | Discharge status: Home / Self Care | Payer: Medicare Other | Source: Ambulatory Visit | Attending: Nurse Practitioner | Admitting: Nurse Practitioner

## 2019-06-23 DIAGNOSIS — M1612 Unilateral primary osteoarthritis, left hip: Secondary | ICD-10-CM | POA: Diagnosis not present

## 2019-06-23 DIAGNOSIS — M25552 Pain in left hip: Secondary | ICD-10-CM

## 2019-06-24 ENCOUNTER — Telehealth: Payer: Self-pay

## 2019-06-25 NOTE — Telephone Encounter (Signed)
Pt said that her pain is still bad but she is not finished with prednisone. I advised she finish her prednisone and then call us once shes finished to see if we need to do anything further. Do you want to do send a referral out or should we wait until she calls?

## 2019-06-25 NOTE — Telephone Encounter (Signed)
Please let her know that her x-ray shows some mild degenerative arthritis, but there are no acute abnormalities. No evidence of fracture or dislocation.

## 2019-06-25 NOTE — Telephone Encounter (Signed)
Let's go ahead and do referral to orthopedics. She and I had talked about that as well. Thanks.

## 2019-06-25 NOTE — Progress Notes (Signed)
Patient to be notified.  

## 2019-06-25 NOTE — Telephone Encounter (Signed)
Hey Beth. This patient will need a referral to orthopedics.

## 2019-06-27 DIAGNOSIS — M25552 Pain in left hip: Secondary | ICD-10-CM | POA: Insufficient documentation

## 2019-07-01 ENCOUNTER — Telehealth: Payer: Self-pay

## 2019-07-01 NOTE — Telephone Encounter (Signed)
CONFIRMED AND SCREENED FOR 07-05-19 OV.

## 2019-07-05 ENCOUNTER — Ambulatory Visit (INDEPENDENT_AMBULATORY_CARE_PROVIDER_SITE_OTHER): Payer: Medicare Other | Admitting: Nurse Practitioner

## 2019-07-05 ENCOUNTER — Other Ambulatory Visit: Payer: Self-pay

## 2019-07-05 ENCOUNTER — Encounter: Payer: Self-pay | Admitting: Nurse Practitioner

## 2019-07-05 VITALS — BP 132/80 | HR 72 | Temp 96.9°F | Resp 16 | Ht 65.0 in | Wt 186.8 lb

## 2019-07-05 DIAGNOSIS — I10 Essential (primary) hypertension: Secondary | ICD-10-CM

## 2019-07-05 DIAGNOSIS — M25552 Pain in left hip: Secondary | ICD-10-CM | POA: Diagnosis not present

## 2019-07-05 DIAGNOSIS — F411 Generalized anxiety disorder: Secondary | ICD-10-CM | POA: Diagnosis not present

## 2019-07-05 DIAGNOSIS — M1612 Unilateral primary osteoarthritis, left hip: Secondary | ICD-10-CM | POA: Diagnosis not present

## 2019-07-05 DIAGNOSIS — J0140 Acute pansinusitis, unspecified: Secondary | ICD-10-CM | POA: Diagnosis not present

## 2019-07-05 MED ORDER — AZITHROMYCIN 250 MG PO TABS: ORAL_TABLET | ORAL | 0 refills | Status: DC

## 2019-07-05 MED ORDER — CLONAZEPAM 0.5 MG PO TABS: ORAL_TABLET | ORAL | 3 refills | Status: DC

## 2019-07-05 NOTE — Progress Notes (Signed)
[facility] [place], [place] [address]  Internal MEDICINE  Office Visit Note  Patient Name: Amanda Duke  [id number]  [id number]  Date of Service: 07/05/2019  Chief Complaint  Patient presents with  . Hypertension  . Hyperlipidemia  . Gastroesophageal Reflux  . Anxiety  . Sinusitis    drainage    The patient is here for follow up. She did have considerable pain in her left hip when she was last seen. X-ray did show mild degenerative changes in both hips without acute abnormality. She states that taking prednisone taper has helped the pain. She has appointment with orthopedic provider later today.  She does have some sinus issues. She has some nasal congestion, scratchy throat, and mild headache. Symptoms have been present since Thursday. They are gradually improving.  Her blood pressure is slightly elevated today.  She is scheduled to have her colonoscopy on July 14, 2019.       Current Medication: Outpatient Encounter Medications as of 07/05/2019  Medication Sig  . carvedilol (COREG) 3.125 MG tablet Take 3.125 mg by mouth 2 (two) times daily with a meal.   . cholecalciferol (VITAMIN D) 1000 units tablet Take 1,000 Units by mouth daily.  . clonazePAM (KLONOPIN) 0.5 MG tablet TAKE 1 TABLET BY MOUTH AT BEDTIME AS NEEDED INSOMNIA/ANXIETY  . fluticasone (FLONASE) 50 MCG/ACT nasal spray Place 2 sprays into the nose as needed.   Marland Kitchen ibuprofen (ADVIL) 600 MG tablet Take 1 tablet (600 mg total) by mouth 2 (two) times daily as needed.  Vanessa Kick Ethyl (VASCEPA) 1 g CAPS Take 1 g by mouth 2 (two) times daily.   Marland Kitchen linaclotide (LINZESS) 290 MCG CAPS capsule Take 1 capsule (290 mcg total) by mouth daily.  Marland Kitchen losartan (COZAAR) 100 MG tablet Take 100 mg by mouth daily.  . ondansetron (ZOFRAN) 4 MG tablet Take 1 tablet (4 mg total) by mouth every 8 (eight) hours as needed for nausea or vomiting.  . pantoprazole (PROTONIX) 40 MG tablet Take 40 mg by mouth daily.   .  rosuvastatin (CRESTOR) 20 MG tablet Take 1 tablet (20 mg total) by mouth daily.  . [DISCONTINUED] clonazePAM (KLONOPIN) 0.5 MG tablet TAKE 1 TABLET BY MOUTH AT BEDTIME AS NEEDED INSOMNIA/ANXIETY  . azithromycin (ZITHROMAX) 250 MG tablet z-pack - take as directed for 5 days for sinusitis  . diltiazem (CARDIZEM CD) 120 MG 24 hr capsule Take 120 mg by mouth daily.   . [DISCONTINUED] methylPREDNISolone (MEDROL) 4 MG TBPK tablet Take by mouth as directed for 6 days (Patient not taking: Reported on 07/05/2019)   No facility-administered encounter medications on file as of 07/05/2019.    Surgical History: Past Surgical History:  Procedure Laterality Date  . APPENDECTOMY  1971  . ARTHRODESIS  1980  . BACK SURGERY  1980  . COLONOSCOPY WITH PROPOFOL N/A 12/12/2016   Procedure: COLONOSCOPY WITH PROPOFOL;  Surgeon: Lollie Sails, MD;  Location: [facility] ENDOSCOPY;  Service: Endoscopy;  Laterality: N/A;  . ESOPHAGOGASTRODUODENOSCOPY (EGD) WITH PROPOFOL N/A 12/12/2016   Procedure: ESOPHAGOGASTRODUODENOSCOPY (EGD) WITH PROPOFOL;  Surgeon: Lollie Sails, MD;  Location: [facility] ENDOSCOPY;  Service: Endoscopy;  Laterality: N/A;  . [place]   right  . TONSILLECTOMY  1948  . TUBAL LIGATION  1971  . VAGINAL HYSTERECTOMY  1985   d/t heavy bleeding    Medical History: Past Medical History:  Diagnosis Date  . Anxiety   . Coronary artery disease   .  GERD (gastroesophageal reflux disease)   . Hyperlipemia   . Hypertension   . Irregular heart beat   . Pre-diabetes   . Prediabetes   . Renal cyst    bilateral    Family History: Family History  Problem Relation Age of Onset  . Heart failure Mother   . Stroke Mother   . Breast cancer Mother        dx at age 60 yo  . Diabetes Mother   . Osteoporosis Mother   . Emphysema Father   . Ovarian cancer Sister        dx at age 63 yo  . Asthma Sister     Social History   Socioeconomic History  . Marital  status: Widowed    Spouse name: Not on file  . Number of children: Not on file  . Years of education: Not on file  . Highest education level: Not on file  Occupational History  . Not on file  Tobacco Use  . Smoking status: Former Research scientist (life sciences)  . Smokeless tobacco: Never Used  Substance and Sexual Activity  . Alcohol use: No    Alcohol/week: 0.0 standard drinks  . Drug use: Not Currently  . Sexual activity: Not Currently  Other Topics Concern  . Not on file  Social History Narrative  . Not on file   Social Determinants of Health   Financial Resource Strain: Low Risk   . Difficulty of Paying Living Expenses: Not hard at all  Food Insecurity: No Food Insecurity  . Worried About Charity fundraiser in the Last Year: Never true  . Ran Out of Food in the Last Year: Never true  Transportation Needs: No Transportation Needs  . Lack of Transportation (Medical): No  . Lack of Transportation (Non-Medical): No  Physical Activity: Inactive  . Days of Exercise per Week: 0 days  . Minutes of Exercise per Session: 0 min  Stress: No Stress Concern Present  . Feeling of Stress : Not at all  Social Connections: Moderately Isolated  . Frequency of Communication with Friends and Family: Three times a week  . Frequency of Social Gatherings with Friends and Family: Three times a week  . Attends Religious Services: Never  . Active Member of Clubs or Organizations: No  . Attends Archivist Meetings: Never  . Marital Status: Widowed  Intimate Partner Violence: Not At Risk  . Fear of Current or Ex-Partner: No  . Emotionally Abused: No  . Physically Abused: No  . Sexually Abused: No      Review of Systems  Constitutional: Positive for activity change. Negative for chills, fatigue and unexpected weight change.       Left hip pain has improved since she started taking prednisone taper  She is scheduled to have appointment with orthopedics this afternoon.   HENT: Positive for congestion,  postnasal drip, sinus pressure, sinus pain and sore throat. Negative for rhinorrhea and sneezing.   Respiratory: Negative for cough, chest tightness, shortness of breath and wheezing.   Cardiovascular: Negative for chest pain and palpitations.       Blood pressure mildly elevated upon arrival.   Gastrointestinal: Negative for abdominal pain, constipation, diarrhea, nausea and vomiting.  Musculoskeletal: Positive for arthralgias and gait problem. Negative for back pain, joint swelling and neck pain.       Improved pain in left hip. Has appointment with orthopedics later this afternoon.   Skin: Negative for rash.  Neurological: Positive for headaches. Negative for dizziness,  tremors and numbness.  Hematological: Negative for adenopathy. Does not bruise/bleed easily.  Psychiatric/Behavioral: Negative for behavioral problems (Depression), dysphoric mood, sleep disturbance and suicidal ideas. The patient is nervous/anxious.     Today's Vitals   07/05/19 1100  BP: 132/80  Pulse: 72  Resp: 16  Temp: (!) 96.9 F (36.1 C)  SpO2: 98%  Weight: 186 lb 12.8 oz (84.7 kg)  Height: 5\' 5"  (1.651 m)   Body mass index is 31.09 kg/m.  Physical Exam Vitals and nursing note reviewed.  Constitutional:      General: She is not in acute distress.    Appearance: Normal appearance. She is well-developed. She is not diaphoretic.  HENT:     Head: Normocephalic and atraumatic.     Right Ear: Decreased hearing noted.     Left Ear: Tympanic membrane is bulging.     Nose: Congestion present.     Right Sinus: Maxillary sinus tenderness and frontal sinus tenderness present.     Left Sinus: Maxillary sinus tenderness and frontal sinus tenderness present.     Mouth/Throat:     Pharynx: Posterior oropharyngeal erythema present. No oropharyngeal exudate.  Eyes:     Pupils: Pupils are equal, round, and reactive to light.  Neck:     Thyroid: No thyromegaly.     Vascular: No carotid bruit or JVD.     Trachea: No  tracheal deviation.  Cardiovascular:     Rate and Rhythm: Normal rate and regular rhythm.     Heart sounds: Normal heart sounds. No murmur. No friction rub. No gallop.   Pulmonary:     Effort: Pulmonary effort is normal. No respiratory distress.     Breath sounds: Normal breath sounds. No wheezing or rales.  Chest:     Chest wall: No tenderness.  Abdominal:     Palpations: Abdomen is soft.  Musculoskeletal:        General: Normal range of motion.     Cervical back: Normal range of motion and neck supple.  Lymphadenopathy:     Cervical: No cervical adenopathy.  Skin:    General: Skin is warm and dry.  Neurological:     General: No focal deficit present.     Mental Status: She is alert and oriented to person, place, and time.     Cranial Nerves: No cranial nerve deficit.  Psychiatric:        Behavior: Behavior normal.        Thought Content: Thought content normal.        Judgment: Judgment normal.      Assessment/Plan: 1. Benign essential HTN Generally stable. Continue bp medication as prescribed   2. Acute non-recurrent pansinusitis Start z-pack. Take as directed for 5 days. Rest and increase fluids. Take OTC medication as needed and as indicated.  - azithromycin (ZITHROMAX) 250 MG tablet; z-pack - take as directed for 5 days for sinusitis  Dispense: 6 tablet; Refill: 0  3. Left hip pain Improving. Advised she keep scheduled appointment with orthopedics.   4. Generalized anxiety disorder May continue to take clonazepam at bedtime as needed. New prescription sent to her pharmacy today.  - clonazePAM (KLONOPIN) 0.5 MG tablet; TAKE 1 TABLET BY MOUTH AT BEDTIME AS NEEDED INSOMNIA/ANXIETY  Dispense: 30 tablet; Refill: 3  General Counseling: Lissete verbalizes understanding of the findings of todays visit and agrees with plan of treatment. I have discussed any further diagnostic evaluation that may be needed or ordered today. We also reviewed her medications today. she  has been  encouraged to call the office with any questions or concerns that should arise related to todays visit.   This patient was seen by [place] with Dr Lavera Guise as a part of collaborative care agreement  Meds ordered this encounter  Medications  . azithromycin (ZITHROMAX) 250 MG tablet    Sig: z-pack - take as directed for 5 days for sinusitis    Dispense:  6 tablet    Refill:  0    Order Specific Question:   Supervising Provider    Answer:   Lavera Guise [id number]  . clonazePAM (KLONOPIN) 0.5 MG tablet    Sig: TAKE 1 TABLET BY MOUTH AT BEDTIME AS NEEDED INSOMNIA/ANXIETY    Dispense:  30 tablet    Refill:  3    Order Specific Question:   Supervising Provider    Answer:   Lavera Guise [id number]    Total time spent: 30 Minutes   Time spent includes review of chart, medications, test results, and follow up plan with the patient.      Dr Lavera Guise Internal medicine

## 2019-07-08 ENCOUNTER — Other Ambulatory Visit: Payer: Self-pay

## 2019-07-08 DIAGNOSIS — M25552 Pain in left hip: Secondary | ICD-10-CM

## 2019-07-08 MED ORDER — IBUPROFEN 600 MG PO TABS: 600.0000 mg | ORAL_TABLET | Freq: Two times a day (BID) | ORAL | 0 refills | Status: DC | PRN

## 2019-07-12 ENCOUNTER — Other Ambulatory Visit
Admission: RE | Admit: 2019-07-12 | Discharge: 2019-07-12 | Disposition: A | Discharge status: Home / Self Care | Payer: Medicare Other | Source: Ambulatory Visit | Attending: Internal Medicine | Admitting: Internal Medicine

## 2019-07-12 DIAGNOSIS — Z20822 Contact with and (suspected) exposure to covid-19: Secondary | ICD-10-CM | POA: Diagnosis not present

## 2019-07-12 DIAGNOSIS — Z01812 Encounter for preprocedural laboratory examination: Secondary | ICD-10-CM | POA: Insufficient documentation

## 2019-07-13 ENCOUNTER — Encounter: Payer: Self-pay | Admitting: Internal Medicine

## 2019-07-13 LAB — SARS CORONAVIRUS 2 (TAT 6-24 HRS): SARS Coronavirus 2: NEGATIVE

## 2019-07-14 ENCOUNTER — Encounter
Admission: RE | Disposition: A | Discharge status: Home / Self Care | Payer: Self-pay | Source: Home / Self Care | Attending: Internal Medicine

## 2019-07-14 ENCOUNTER — Encounter: Payer: Self-pay | Admitting: Internal Medicine

## 2019-07-14 ENCOUNTER — Ambulatory Visit: Payer: Medicare Other | Admitting: Certified Registered"

## 2019-07-14 ENCOUNTER — Ambulatory Visit
Admission: RE | Admit: 2019-07-14 | Discharge: 2019-07-14 | Disposition: A | Discharge status: Home / Self Care | Payer: Medicare Other | Attending: Internal Medicine | Admitting: Internal Medicine

## 2019-07-14 ENCOUNTER — Other Ambulatory Visit: Payer: Self-pay

## 2019-07-14 DIAGNOSIS — K5730 Diverticulosis of large intestine without perforation or abscess without bleeding: Secondary | ICD-10-CM | POA: Insufficient documentation

## 2019-07-14 DIAGNOSIS — K640 First degree hemorrhoids: Secondary | ICD-10-CM | POA: Diagnosis not present

## 2019-07-14 DIAGNOSIS — R1012 Left upper quadrant pain: Secondary | ICD-10-CM | POA: Insufficient documentation

## 2019-07-14 DIAGNOSIS — R7303 Prediabetes: Secondary | ICD-10-CM | POA: Diagnosis not present

## 2019-07-14 DIAGNOSIS — Z79899 Other long term (current) drug therapy: Secondary | ICD-10-CM | POA: Diagnosis not present

## 2019-07-14 DIAGNOSIS — Z882 Allergy status to sulfonamides status: Secondary | ICD-10-CM | POA: Insufficient documentation

## 2019-07-14 DIAGNOSIS — I10 Essential (primary) hypertension: Secondary | ICD-10-CM | POA: Insufficient documentation

## 2019-07-14 DIAGNOSIS — E785 Hyperlipidemia, unspecified: Secondary | ICD-10-CM | POA: Insufficient documentation

## 2019-07-14 DIAGNOSIS — K219 Gastro-esophageal reflux disease without esophagitis: Secondary | ICD-10-CM | POA: Insufficient documentation

## 2019-07-14 DIAGNOSIS — Z880 Allergy status to penicillin: Secondary | ICD-10-CM | POA: Insufficient documentation

## 2019-07-14 DIAGNOSIS — Z883 Allergy status to other anti-infective agents status: Secondary | ICD-10-CM | POA: Diagnosis not present

## 2019-07-14 DIAGNOSIS — Z86010 Personal history of colonic polyps: Secondary | ICD-10-CM | POA: Diagnosis not present

## 2019-07-14 DIAGNOSIS — Z888 Allergy status to other drugs, medicaments and biological substances status: Secondary | ICD-10-CM | POA: Diagnosis not present

## 2019-07-14 DIAGNOSIS — Z881 Allergy status to other antibiotic agents status: Secondary | ICD-10-CM | POA: Diagnosis not present

## 2019-07-14 DIAGNOSIS — K2970 Gastritis, unspecified, without bleeding: Secondary | ICD-10-CM | POA: Diagnosis not present

## 2019-07-14 DIAGNOSIS — K635 Polyp of colon: Secondary | ICD-10-CM | POA: Diagnosis not present

## 2019-07-14 DIAGNOSIS — Z87891 Personal history of nicotine dependence: Secondary | ICD-10-CM | POA: Diagnosis not present

## 2019-07-14 DIAGNOSIS — F419 Anxiety disorder, unspecified: Secondary | ICD-10-CM | POA: Diagnosis not present

## 2019-07-14 DIAGNOSIS — I2510 Atherosclerotic heart disease of native coronary artery without angina pectoris: Secondary | ICD-10-CM | POA: Diagnosis not present

## 2019-07-14 DIAGNOSIS — K5790 Diverticulosis of intestine, part unspecified, without perforation or abscess without bleeding: Secondary | ICD-10-CM | POA: Diagnosis not present

## 2019-07-14 DIAGNOSIS — D120 Benign neoplasm of cecum: Secondary | ICD-10-CM | POA: Diagnosis not present

## 2019-07-14 DIAGNOSIS — K449 Diaphragmatic hernia without obstruction or gangrene: Secondary | ICD-10-CM | POA: Insufficient documentation

## 2019-07-14 DIAGNOSIS — K3189 Other diseases of stomach and duodenum: Secondary | ICD-10-CM | POA: Insufficient documentation

## 2019-07-14 DIAGNOSIS — Z09 Encounter for follow-up examination after completed treatment for conditions other than malignant neoplasm: Secondary | ICD-10-CM | POA: Diagnosis not present

## 2019-07-14 HISTORY — DX: Other specified congenital malformations of intestine: Q43.8

## 2019-07-14 HISTORY — PX: ESOPHAGOGASTRODUODENOSCOPY (EGD) WITH PROPOFOL: SHX5813

## 2019-07-14 HISTORY — DX: Personal history of colonic polyps: Z86.010

## 2019-07-14 HISTORY — DX: Gastritis, unspecified, without bleeding: K29.70

## 2019-07-14 HISTORY — PX: COLONOSCOPY WITH PROPOFOL: SHX5780

## 2019-07-14 SURGERY — COLONOSCOPY WITH PROPOFOL
Anesthesia: General

## 2019-07-14 MED ORDER — SODIUM CHLORIDE 0.9 % IV SOLN: INTRAVENOUS | Status: DC

## 2019-07-14 MED ORDER — PROPOFOL 10 MG/ML IV BOLUS
INTRAVENOUS | Status: DC | PRN
  Administered 2019-07-14 (×2): 10 mg via INTRAVENOUS
  Administered 2019-07-14: 50 mg via INTRAVENOUS

## 2019-07-14 MED ORDER — GLYCOPYRROLATE 0.2 MG/ML IJ SOLN
INTRAMUSCULAR | Status: DC | PRN
  Administered 2019-07-14: .2 mg via INTRAVENOUS

## 2019-07-14 MED ORDER — PROPOFOL 500 MG/50ML IV EMUL
INTRAVENOUS | Status: DC | PRN
  Administered 2019-07-14: 165 ug/kg/min via INTRAVENOUS

## 2019-07-14 MED ORDER — PHENYLEPHRINE HCL (PRESSORS) 10 MG/ML IV SOLN
INTRAVENOUS | Status: DC | PRN
  Administered 2019-07-14: 100 ug via INTRAVENOUS

## 2019-07-14 MED ORDER — LIDOCAINE HCL (CARDIAC) PF 100 MG/5ML IV SOSY
PREFILLED_SYRINGE | INTRAVENOUS | Status: DC | PRN
  Administered 2019-07-14: 100 mg via INTRATRACHEAL

## 2019-07-14 MED ORDER — SODIUM CHLORIDE 0.9 % IV SOLN: INTRAVENOUS | Status: DC | PRN

## 2019-07-14 NOTE — Anesthesia Preprocedure Evaluation (Signed)
Anesthesia Evaluation  Patient identified by MRN, date of birth, ID band Patient awake    Reviewed: Allergy & Precautions, H&P , NPO status , Patient's Chart, lab work & pertinent test results, reviewed documented beta blocker date and time   History of Anesthesia Complications Negative for: history of anesthetic complications  Airway Mallampati: II   Neck ROM: full    Dental  (+) Poor Dentition, Teeth Intact   Pulmonary neg shortness of breath, Recent URI , Resolved, former smoker,    Pulmonary exam normal        Cardiovascular hypertension, (-) angina+ CAD  (-) Past MI and (-) Cardiac Stents Normal cardiovascular exam+ dysrhythmias (-) Valvular Problems/Murmurs Rhythm:regular Rate:Normal     Neuro/Psych PSYCHIATRIC DISORDERS Anxiety negative neurological ROS     GI/Hepatic Neg liver ROS, GERD  Medicated,  Endo/Other  negative endocrine ROS  Renal/GU Renal disease  negative genitourinary   Musculoskeletal   Abdominal   Peds  Hematology negative hematology ROS (+)   Anesthesia Other Findings Past Medical History: No date: Anxiety No date: Coronary artery disease No date: GERD (gastroesophageal reflux disease) No date: Hyperlipemia No date: Hypertension No date: Irregular heart beat No date: Pre-diabetes No date: Prediabetes No date: Renal cyst     Comment:  bilateral Past Surgical History: 1971: APPENDECTOMY 1980: ARTHRODESIS 1980: BACK SURGERY 1985: LAPAROSCOPIC UNILATERAL SALPINGO OOPHERECTOMY     Comment:  right 1948: TONSILLECTOMY 1971: TUBAL LIGATION 1985: VAGINAL HYSTERECTOMY     Comment:  d/t heavy bleeding BMI    Body Mass Index:  29.54 kg/m     Reproductive/Obstetrics negative OB ROS                             Anesthesia Physical  Anesthesia Plan  ASA: III  Anesthesia Plan: General   Post-op Pain Management:    Induction: Intravenous  PONV Risk Score  and Plan: 3 and Propofol infusion and TIVA  Airway Management Planned: Natural Airway and Nasal Cannula  Additional Equipment:   Intra-op Plan:   Post-operative Plan:   Informed Consent: I have reviewed the patients History and Physical, chart, labs and discussed the procedure including the risks, benefits and alternatives for the proposed anesthesia with the patient or authorized representative who has indicated his/her understanding and acceptance.     Dental Advisory Given  Plan Discussed with: CRNA  Anesthesia Plan Comments:         Anesthesia Quick Evaluation

## 2019-07-14 NOTE — Transfer of Care (Signed)
Immediate Anesthesia Transfer of Care Note  Patient: Amanda Duke  Procedure(s) Performed: COLONOSCOPY WITH PROPOFOL (N/A ) ESOPHAGOGASTRODUODENOSCOPY (EGD) WITH PROPOFOL (N/A )  Patient Location: Endoscopy Unit  Anesthesia Type:General  Level of Consciousness: drowsy, patient cooperative and responds to stimulation  Airway & Oxygen Therapy: Patient Spontanous Breathing and Patient connected to face mask oxygen  Post-op Assessment: Report given to RN and Post -op Vital signs reviewed and stable  Post vital signs: Reviewed and stable  Last Vitals:  Vitals Value Taken Time  BP 102/58 07/14/19 1048  Temp    Pulse 70 07/14/19 1048  Resp 22 07/14/19 1048  SpO2 100 % 07/14/19 1048  Vitals shown include unvalidated device data.  Last Pain:  Vitals:   07/14/19 1048  TempSrc: (P) Temporal  PainSc:          Complications: No apparent anesthesia complications

## 2019-07-14 NOTE — H&P (Signed)
Outpatient short stay form Pre-procedure 07/14/2019 9:35 AM Amanda Duke K. Alice Reichert, M.D.  Primary Physician: Clayborn Bigness, M.D.  Reason for visit: LUQ pain, gastritis, duodenitis, hx of adenomatous colon polyps  History of present illness:  Patient with hx of EGD and colonoscopy in 2018 : Gastritis, duodenitis and adenomatous colon polyps x 2. Patient denies change in bowel habits, rectal bleeding, weight loss or abdominal pain.     No current facility-administered medications for this encounter.  Medications Prior to Admission  Medication Sig Dispense Refill Last Dose  . azithromycin (ZITHROMAX) 250 MG tablet z-pack - take as directed for 5 days for sinusitis 6 tablet 0   . carvedilol (COREG) 3.125 MG tablet Take 3.125 mg by mouth 2 (two) times daily with a meal.      . cholecalciferol (VITAMIN D) 1000 units tablet Take 1,000 Units by mouth daily.     . clonazePAM (KLONOPIN) 0.5 MG tablet TAKE 1 TABLET BY MOUTH AT BEDTIME AS NEEDED INSOMNIA/ANXIETY 30 tablet 3   . diltiazem (CARDIZEM CD) 120 MG 24 hr capsule Take 120 mg by mouth daily.      . fluticasone (FLONASE) 50 MCG/ACT nasal spray Place 2 sprays into the nose as needed.      Marland Kitchen ibuprofen (ADVIL) 600 MG tablet Take 1 tablet (600 mg total) by mouth 2 (two) times daily as needed. 45 tablet 0   . Icosapent Ethyl (VASCEPA) 1 g CAPS Take 1 g by mouth 2 (two) times daily.      Marland Kitchen linaclotide (LINZESS) 290 MCG CAPS capsule Take 1 capsule (290 mcg total) by mouth daily. 30 capsule 3   . losartan (COZAAR) 100 MG tablet Take 100 mg by mouth daily.     . ondansetron (ZOFRAN) 4 MG tablet Take 1 tablet (4 mg total) by mouth every 8 (eight) hours as needed for nausea or vomiting. 30 tablet 0   . pantoprazole (PROTONIX) 40 MG tablet Take 40 mg by mouth daily.      . rosuvastatin (CRESTOR) 20 MG tablet Take 1 tablet (20 mg total) by mouth daily. 30 tablet 3      Allergies  Allergen Reactions  . Amoxicillin Hives    Patient tolerated Ceftriaxone in ED  on 01/10/19.   Marland Kitchen Clindamycin Diarrhea  . Gemfibrozil Other (See Comments)    muscle ache  . Levofloxacin Other (See Comments)  . Levofloxacin In D5w Other (See Comments)  . Metoprolol Other (See Comments)  . Simvastatin Other (See Comments)    muscle ache  . Sulfa Antibiotics Other (See Comments)  . Nystatin-Triamcinolone Rash     Past Medical History:  Diagnosis Date  . Anxiety   . Coronary artery disease   . Gastritis   . GERD (gastroesophageal reflux disease)   . History of colonic polyps   . Hyperlipemia   . Hypertension   . Irregular heart beat   . Pre-diabetes   . Prediabetes   . Redundant colon   . Renal cyst    bilateral    Review of systems:  Otherwise negative.    Physical Exam  Gen: Alert, oriented. Appears stated age.  HEENT: [place]/AT. PERRLA. Lungs: CTA, no wheezes. CV: RR nl S1, S2. Abd: soft, benign, no masses. BS+ Ext: No edema. Pulses 2+    Planned procedures: Proceed with EGD and colonoscopy. The patient understands the nature of the planned procedure, indications, risks, alternatives and potential complications including but not limited to bleeding, infection, perforation, damage to internal organs and possible  oversedation/side effects from anesthesia. The patient agrees and gives consent to proceed.  Please refer to procedure notes for findings, recommendations and patient disposition/instructions.     Lennart Gladish K. Alice Reichert, M.D. Gastroenterology 07/14/2019  9:35 AM

## 2019-07-14 NOTE — Op Note (Signed)
[facility] Gastroenterology Patient Name: Amanda Duke Procedure Date: 07/14/2019 10:04 AM MRN: [id number] Account #: [id number] Date of Birth: [birth year] Admit Type: Outpatient Age: 79 Room: [facility] ENDO ROOM 3 Gender: Female Note Status: Finalized Procedure:             Upper GI endoscopy Indications:           Abdominal pain in the left upper quadrant Providers:             Benay Pike. Alice Reichert MD, MD Referring MD:          Lavera Guise, MD (Referring MD) Medicines:             Propofol per Anesthesia Complications:         No immediate complications. Procedure:             Pre-Anesthesia Assessment:                        - The risks and benefits of the procedure and the                         sedation options and risks were discussed with the                         patient. All questions were answered and informed                         consent was obtained.                        - Patient identification and proposed procedure were                         verified prior to the procedure by the nurse. The                         procedure was verified in the procedure room.                        - ASA Grade Assessment: III - A patient with severe                         systemic disease.                        - After reviewing the risks and benefits, the patient                         was deemed in satisfactory condition to undergo the                         procedure.                        After obtaining informed consent, the endoscope was                         passed under direct vision. Throughout the procedure,  the patient's blood pressure, pulse, and oxygen                         saturations were monitored continuously. The Endoscope                         was introduced through the mouth, and advanced to the                         third part of duodenum. The upper GI endoscopy was                         accomplished  without difficulty. The patient tolerated                         the procedure well. Findings:      The esophagus was normal.      Patchy mildly erythematous mucosa without bleeding was found in the       gastric antrum. Biopsies were taken with a cold forceps for Helicobacter       pylori testing.      A large hiatal hernia was present.      The examined duodenum was normal.      The exam was otherwise without abnormality. Impression:            - Normal esophagus.                        - Erythematous mucosa in the antrum. Biopsied.                        - Large hiatal hernia.                        - Normal examined duodenum.                        - The examination was otherwise normal. Recommendation:        - Await pathology results.                        - Proceed with colonoscopy Procedure Code(s):     --- Professional ---                        [phone], Esophagogastroduodenoscopy, flexible,                         transoral; with biopsy, single or multiple Diagnosis Code(s):     --- Professional ---                        R10.12, Left upper quadrant pain                        K44.9, Diaphragmatic hernia without obstruction or                         gangrene                        K31.89, Other diseases of stomach and duodenum CPT copyright 2019 American  Medical Association. All rights reserved. The codes documented in this report are preliminary and upon coder review may  be revised to meet current compliance requirements. Efrain Sella MD, MD 07/14/2019 10:26:37 AM This report has been signed electronically. Number of Addenda: 0 Note Initiated On: 07/14/2019 10:04 AM Estimated Blood Loss:  Estimated blood loss: none.      [facility]

## 2019-07-14 NOTE — Op Note (Signed)
[facility] Gastroenterology Patient Name: Amanda Duke Procedure Date: 07/14/2019 10:03 AM MRN: [id number] Account #: [id number] Date of Birth: [birth year] Admit Type: Outpatient Age: 79 Room: [facility] ENDO ROOM 3 Gender: Female Note Status: Finalized Procedure:             Colonoscopy Indications:           Surveillance: Personal history of adenomatous polyps                         on last colonoscopy > 3 years ago Providers:             Lorie Apley K. Alice Reichert MD, MD Referring MD:          Lavera Guise, MD (Referring MD) Medicines:             Propofol per Anesthesia Complications:         No immediate complications. Procedure:             Pre-Anesthesia Assessment:                        - The risks and benefits of the procedure and the                         sedation options and risks were discussed with the                         patient. All questions were answered and informed                         consent was obtained.                        - Patient identification and proposed procedure were                         verified prior to the procedure by the nurse. The                         procedure was verified in the procedure room.                        - ASA Grade Assessment: III - A patient with severe                         systemic disease.                        - After reviewing the risks and benefits, the patient                         was deemed in satisfactory condition to undergo the                         procedure.                        After obtaining informed consent, the colonoscope was                         passed under direct  vision. Throughout the procedure,                         the patient's blood pressure, pulse, and oxygen                         saturations were monitored continuously. The                         Colonoscope was introduced through the anus and                         advanced to the the cecum, identified  by appendiceal                         orifice and ileocecal valve. The colonoscopy was                         performed without difficulty. The patient tolerated                         the procedure well. The quality of the bowel                         preparation was good. The ileocecal valve, appendiceal                         orifice, and rectum were photographed. Findings:      The perianal and digital rectal examinations were normal. Pertinent       negatives include normal sphincter tone and no palpable rectal lesions.      Multiple small and large-mouthed diverticula were found in the sigmoid       colon.      A 4 mm polyp was found in the cecum. The polyp was sessile. The polyp       was removed with a jumbo cold forceps. Resection and retrieval were       complete.      A tattoo was seen in the mid sigmoid colon. A post-polypectomy scar was       found at the tattoo site.      A 3 mm polyp was found in the mid sigmoid colon. The polyp was sessile.       The polyp was removed with a jumbo cold forceps. Resection and retrieval       were complete.      Non-bleeding internal hemorrhoids were found during retroflexion. The       hemorrhoids were Grade I (internal hemorrhoids that do not prolapse).      The exam was otherwise without abnormality. Impression:            - Diverticulosis in the sigmoid colon.                        - One 4 mm polyp in the cecum, removed with a jumbo                         cold forceps. Resected and retrieved.                        - A  tattoo was seen in the mid sigmoid colon. A                         post-polypectomy scar was found at the tattoo site.                        - One diminutive polyp in the mid transverse colon,                         removed with a jumbo cold forceps. Resected and                         retrieved.                        - Non-bleeding internal hemorrhoids.                        - The examination was otherwise  normal. Recommendation:        - Await pathology results from EGD, also performed                         today.                        - Patient has a contact number available for                         emergencies. The signs and symptoms of potential                         delayed complications were discussed with the patient.                         Return to normal activities tomorrow. Written                         discharge instructions were provided to the patient.                        - Resume previous diet.                        - Continue present medications.                        - Await pathology results.                        - If polyps are benign or adenomatous without                         dysplasia, I will advise NO further colonoscopy due to                         advanced age and/or severe comorbidity.                        - Return to nurse practitioner in 1 month.                        -  Follow up with Stephens November, GI Nurse                         Practioner, in office to discuss results and monitor                         progress. Procedure Code(s):     --- Professional ---                        [phone], Colonoscopy, flexible; with biopsy, single or                         multiple Diagnosis Code(s):     --- Professional ---                        K57.30, Diverticulosis of large intestine without                         perforation or abscess without bleeding                        K63.5, Polyp of colon                        K64.0, First degree hemorrhoids                        Z86.010, Personal history of colonic polyps CPT copyright 2019 American Medical Association. All rights reserved. The codes documented in this report are preliminary and upon coder review may  be revised to meet current compliance requirements. Efrain Sella MD, MD 07/14/2019 10:52:20 AM This report has been signed electronically. Number of Addenda: 0 Note  Initiated On: 07/14/2019 10:03 AM Scope Withdrawal Time: 0 hours 9 minutes 37 seconds  Total Procedure Duration: 0 hours 16 minutes 42 seconds  Estimated Blood Loss:  Estimated blood loss: none. Estimated blood loss:                         none. Estimated blood loss: none.      [facility]

## 2019-07-14 NOTE — Interval H&P Note (Signed)
History and Physical Interval Note:  07/14/2019 9:36 AM  Amanda Duke  has presented today for surgery, with the diagnosis of LUQ PAIN,PERSONAL HX.OF COLON POLYPS.  The various methods of treatment have been discussed with the patient and family. After consideration of risks, benefits and other options for treatment, the patient has consented to  Procedure(s): COLONOSCOPY WITH PROPOFOL (N/A) ESOPHAGOGASTRODUODENOSCOPY (EGD) WITH PROPOFOL (N/A) as a surgical intervention.  The patient's history has been reviewed, patient examined, no change in status, stable for surgery.  I have reviewed the patient's chart and labs.  Questions were answered to the patient's satisfaction.     [place], [place]

## 2019-07-14 NOTE — Anesthesia Postprocedure Evaluation (Signed)
Anesthesia Post Note  Patient: Amanda Duke  Procedure(s) Performed: COLONOSCOPY WITH PROPOFOL (N/A ) ESOPHAGOGASTRODUODENOSCOPY (EGD) WITH PROPOFOL (N/A )  Patient location during evaluation: Endoscopy Anesthesia Type: General Level of consciousness: awake and alert Pain management: pain level controlled Vital Signs Assessment: post-procedure vital signs reviewed and stable Respiratory status: spontaneous breathing, nonlabored ventilation, respiratory function stable and patient connected to nasal cannula oxygen Cardiovascular status: blood pressure returned to baseline and stable Postop Assessment: no apparent nausea or vomiting Anesthetic complications: no     Last Vitals:  Vitals:   07/14/19 1058 07/14/19 1108  BP: (!) 90/50 112/71  Pulse: 72 72  Resp: (!) 21 (!) 24  Temp:    SpO2: 100% 99%    Last Pain:  Vitals:   07/14/19 1108  TempSrc:   PainSc: 0-No pain                 Martha Clan

## 2019-07-15 ENCOUNTER — Encounter: Payer: Self-pay | Admitting: *Deleted

## 2019-07-15 LAB — SURGICAL PATHOLOGY

## 2019-08-03 ENCOUNTER — Other Ambulatory Visit: Payer: Self-pay

## 2019-08-03 ENCOUNTER — Other Ambulatory Visit
Admission: RE | Admit: 2019-08-03 | Discharge: 2019-08-03 | Disposition: A | Discharge status: Home / Self Care | Payer: Medicare Other | Source: Ambulatory Visit | Attending: Nurse Practitioner | Admitting: Nurse Practitioner

## 2019-08-03 DIAGNOSIS — E756 Lipid storage disorder, unspecified: Secondary | ICD-10-CM | POA: Diagnosis not present

## 2019-08-03 DIAGNOSIS — Z0001 Encounter for general adult medical examination with abnormal findings: Secondary | ICD-10-CM | POA: Insufficient documentation

## 2019-08-03 DIAGNOSIS — Z0000 Encounter for general adult medical examination without abnormal findings: Secondary | ICD-10-CM | POA: Diagnosis not present

## 2019-08-03 DIAGNOSIS — E559 Vitamin D deficiency, unspecified: Secondary | ICD-10-CM | POA: Diagnosis not present

## 2019-08-03 DIAGNOSIS — E0781 Sick-euthyroid syndrome: Secondary | ICD-10-CM | POA: Insufficient documentation

## 2019-08-03 LAB — COMPREHENSIVE METABOLIC PANEL
ALT: 12 U/L (ref 0–44)
AST: 19 U/L (ref 15–41)
Albumin: 4.1 g/dL (ref 3.5–5.0)
Alkaline Phosphatase: 81 U/L (ref 38–126)
Anion gap: 10 (ref 5–15)
BUN: 14 mg/dL (ref 8–23)
CO2: 27 mmol/L (ref 22–32)
Calcium: 9.1 mg/dL (ref 8.9–10.3)
Chloride: 103 mmol/L (ref 98–111)
Creatinine, Ser: 1.11 mg/dL — ABNORMAL HIGH (ref 0.44–1.00)
GFR calc Af Amer: 55 mL/min — ABNORMAL LOW (ref >60)
GFR calc non Af Amer: 48 mL/min — ABNORMAL LOW (ref >60)
Glucose, Bld: 106 mg/dL — ABNORMAL HIGH (ref 70–99)
Potassium: 4.1 mmol/L (ref 3.5–5.1)
Sodium: 140 mmol/L (ref 135–145)
Total Bilirubin: 0.7 mg/dL (ref 0.3–1.2)
Total Protein: 7.4 g/dL (ref 6.5–8.1)

## 2019-08-03 LAB — CBC WITH DIFFERENTIAL/PLATELET
Abs Immature Granulocytes: 0.01 10*3/uL (ref 0.00–0.07)
Basophils Absolute: 0.1 10*3/uL (ref 0.0–0.1)
Basophils Relative: 2 %
Eosinophils Absolute: 0.2 10*3/uL (ref 0.0–0.5)
Eosinophils Relative: 3 %
HCT: 40.2 % (ref 36.0–46.0)
Hemoglobin: 12.9 g/dL (ref 12.0–15.0)
Immature Granulocytes: 0 %
Lymphocytes Relative: 29 %
Lymphs Abs: 2 10*3/uL (ref 0.7–4.0)
MCH: 28.4 pg (ref 26.0–34.0)
MCHC: 32.1 g/dL (ref 30.0–36.0)
MCV: 88.5 fL (ref 80.0–100.0)
Monocytes Absolute: 0.7 10*3/uL (ref 0.1–1.0)
Monocytes Relative: 11 %
Neutro Abs: 3.8 10*3/uL (ref 1.7–7.7)
Neutrophils Relative %: 55 %
Platelets: 299 10*3/uL (ref 150–400)
RBC: 4.54 MIL/uL (ref 3.87–5.11)
RDW: 14.6 % (ref 11.5–15.5)
WBC: 6.8 10*3/uL (ref 4.0–10.5)
nRBC: 0 % (ref 0.0–0.2)

## 2019-08-03 LAB — LIPID PANEL
Cholesterol: 244 mg/dL — ABNORMAL HIGH (ref 0–200)
HDL: 45 mg/dL (ref >40)
LDL Cholesterol: 156 mg/dL — ABNORMAL HIGH (ref 0–99)
Total CHOL/HDL Ratio: 5.4 RATIO
Triglycerides: 213 mg/dL — ABNORMAL HIGH (ref <150)
VLDL: 43 mg/dL — ABNORMAL HIGH (ref 0–40)

## 2019-08-03 LAB — T4, FREE: Free T4: 0.76 ng/dL (ref 0.61–1.12)

## 2019-08-03 LAB — TSH: TSH: 2.355 u[IU]/mL (ref 0.350–4.500)

## 2019-08-03 LAB — VITAMIN D 25 HYDROXY (VIT D DEFICIENCY, FRACTURES): Vit D, 25-Hydroxy: 16.17 ng/mL — ABNORMAL LOW (ref 30–100)

## 2019-08-09 ENCOUNTER — Telehealth: Payer: Self-pay | Admitting: Nurse Practitioner

## 2019-08-10 ENCOUNTER — Other Ambulatory Visit: Payer: Self-pay | Admitting: Nurse Practitioner

## 2019-08-10 DIAGNOSIS — E559 Vitamin D deficiency, unspecified: Secondary | ICD-10-CM

## 2019-08-10 MED ORDER — ERGOCALCIFEROL 1.25 MG (50000 UT) PO CAPS: 50000.0000 [IU] | ORAL_CAPSULE | ORAL | 5 refills | Status: DC

## 2019-08-10 NOTE — Progress Notes (Signed)
Patient treated with drisdol once weekly and notified of results.

## 2019-08-10 NOTE — Progress Notes (Signed)
Vitamin d deficiency noted on labs. Sent prescription for Drisdol weekly for next several months to [facility] in [place]. Mild elevation of cholesterol panel. She should limit fried and fatty foods in her diet and participate in regular, low-impact physical activity. Other labs good.

## 2019-08-10 NOTE — Telephone Encounter (Signed)
Pt was notified. Pt would like labs mailed. I will mail them out today.

## 2019-08-10 NOTE — Telephone Encounter (Signed)
Please let the patient know that there was Vitamin d deficiency noted on labs. Sent prescription for Drisdol weekly for next several months to [facility] in [place]. Mild elevation of cholesterol panel. She should limit fried and fatty foods in her diet and participate in regular, low-impact physical activity. Other labs good.  Thanks.

## 2019-08-26 DIAGNOSIS — I2510 Atherosclerotic heart disease of native coronary artery without angina pectoris: Secondary | ICD-10-CM | POA: Diagnosis not present

## 2019-08-26 DIAGNOSIS — E782 Mixed hyperlipidemia: Secondary | ICD-10-CM | POA: Diagnosis not present

## 2019-08-26 DIAGNOSIS — I10 Essential (primary) hypertension: Secondary | ICD-10-CM | POA: Diagnosis not present

## 2019-08-26 DIAGNOSIS — I471 Supraventricular tachycardia: Secondary | ICD-10-CM | POA: Diagnosis not present

## 2019-09-28 ENCOUNTER — Other Ambulatory Visit: Payer: Self-pay

## 2019-09-28 DIAGNOSIS — E782 Mixed hyperlipidemia: Secondary | ICD-10-CM

## 2019-09-29 ENCOUNTER — Telehealth: Payer: Self-pay

## 2019-09-29 NOTE — Telephone Encounter (Signed)
Spoke with she is no longer taking crestor  20 mg she said as per dr Nehemiah Massed she is taking crestor 40 mg and also advised to call dr Nehemiah Massed for refills

## 2019-11-02 ENCOUNTER — Telehealth: Payer: Self-pay

## 2019-11-02 NOTE — Telephone Encounter (Signed)
No vm on both numbers was unable to confirm 11-04-19 ov.

## 2019-11-04 ENCOUNTER — Ambulatory Visit: Payer: Medicare Other | Admitting: Nurse Practitioner

## 2019-11-05 ENCOUNTER — Telehealth: Payer: Self-pay

## 2019-11-05 NOTE — Telephone Encounter (Signed)
Confirmed and screened for 11-11-19 ov. 

## 2019-11-09 ENCOUNTER — Telehealth: Payer: Self-pay

## 2019-11-09 NOTE — Telephone Encounter (Signed)
CALLED PT ON BOTH NUMBERS, UNABLE TO REACH, NO VM SETUP. -AR

## 2019-11-11 ENCOUNTER — Ambulatory Visit: Payer: Medicare Other | Admitting: Nurse Practitioner

## 2019-11-12 ENCOUNTER — Telehealth: Payer: Self-pay

## 2019-11-12 NOTE — Telephone Encounter (Signed)
Confirmed appointment on 11/15/2019. klh

## 2019-11-15 ENCOUNTER — Ambulatory Visit (INDEPENDENT_AMBULATORY_CARE_PROVIDER_SITE_OTHER): Payer: Medicare Other | Admitting: Nurse Practitioner

## 2019-11-15 ENCOUNTER — Encounter: Payer: Self-pay | Admitting: Nurse Practitioner

## 2019-11-15 ENCOUNTER — Other Ambulatory Visit: Payer: Self-pay

## 2019-11-15 VITALS — BP 152/78 | HR 70 | Temp 97.2°F | Resp 16 | Ht 65.0 in | Wt 184.2 lb

## 2019-11-15 DIAGNOSIS — F411 Generalized anxiety disorder: Secondary | ICD-10-CM | POA: Diagnosis not present

## 2019-11-15 DIAGNOSIS — E782 Mixed hyperlipidemia: Secondary | ICD-10-CM

## 2019-11-15 DIAGNOSIS — K581 Irritable bowel syndrome with constipation: Secondary | ICD-10-CM | POA: Diagnosis not present

## 2019-11-15 DIAGNOSIS — I10 Essential (primary) hypertension: Secondary | ICD-10-CM | POA: Diagnosis not present

## 2019-11-15 NOTE — Progress Notes (Signed)
[facility] [place], [place] [address]  Internal MEDICINE  Office Visit Note  Patient Name: Amanda Duke  [id number]  [id number]  Date of Service: 12/06/2019  Chief Complaint  Patient presents with  . Follow-up  . Hypertension  . Hyperlipidemia  . Quality Metric Gaps    TDAP    The patient is here for routine, follow up visit. The patient states that she is doing well and has no concerns or complaints. Her blood pressure is mildly elevated, however, it is mostly well managed. Since her last visit, she had screening endoscopy and colonoscopy. Her endoscopy showed large iatal hernia and erythema in the gastric antrum. Her colonoscopy was positive for diverticulosis, two small polyps, and non-bleeding internal hemorrhoids. No further screening colonoscopies are recommended.       Current Medication: Outpatient Encounter Medications as of 11/15/2019  Medication Sig Note  . carvedilol (COREG) 3.125 MG tablet Take 3.125 mg by mouth 2 (two) times daily with a meal.    . cholecalciferol (VITAMIN D) 1000 units tablet Take 1,000 Units by mouth daily.   . clonazePAM (KLONOPIN) 0.5 MG tablet TAKE 1 TABLET BY MOUTH AT BEDTIME AS NEEDED INSOMNIA/ANXIETY   . fluticasone (FLONASE) 50 MCG/ACT nasal spray Place 2 sprays into the nose as needed.    Marland Kitchen ibuprofen (ADVIL) 600 MG tablet Take 1 tablet (600 mg total) by mouth 2 (two) times daily as needed.   Vanessa Kick Ethyl (VASCEPA) 1 g CAPS Take 1 g by mouth 2 (two) times daily.    Marland Kitchen linaclotide (LINZESS) 290 MCG CAPS capsule Take 1 capsule (290 mcg total) by mouth daily.   Marland Kitchen losartan (COZAAR) 100 MG tablet Take 100 mg by mouth daily.   . ondansetron (ZOFRAN) 4 MG tablet Take 1 tablet (4 mg total) by mouth every 8 (eight) hours as needed for nausea or vomiting.   . pantoprazole (PROTONIX) 40 MG tablet Take 40 mg by mouth daily.    . rosuvastatin (CRESTOR) 20 MG tablet Take 1 tablet (20 mg total) by mouth daily. (Patient  taking differently: Take 40 mg by mouth daily. )   . [DISCONTINUED] ergocalciferol (DRISDOL) 1.25 MG (50000 UT) capsule Take 1 capsule (50,000 Units total) by mouth once a week.   . diltiazem (CARDIZEM CD) 120 MG 24 hr capsule Take 120 mg by mouth daily.    . [DISCONTINUED] azithromycin (ZITHROMAX) 250 MG tablet z-pack - take as directed for 5 days for sinusitis (Patient not taking: Reported on 11/15/2019)   . [DISCONTINUED] simvastatin (ZOCOR) 10 MG tablet Take 10 mg by mouth daily. (Patient not taking: Reported on 11/15/2019) 11/15/2019: d/c per cardiology and changed to rosuvastat   No facility-administered encounter medications on file as of 11/15/2019.    Surgical History: Past Surgical History:  Procedure Laterality Date  . APPENDECTOMY  1971  . ARTHRODESIS  1980  . BACK SURGERY  1980  . COLONOSCOPY WITH PROPOFOL N/A 12/12/2016   Procedure: COLONOSCOPY WITH PROPOFOL;  Surgeon: Lollie Sails, MD;  Location: [facility] ENDOSCOPY;  Service: Endoscopy;  Laterality: N/A;  . COLONOSCOPY WITH PROPOFOL N/A 07/14/2019   Procedure: COLONOSCOPY WITH PROPOFOL;  Surgeon: Toledo, Benay Pike, MD;  Location: [facility] ENDOSCOPY;  Service: Gastroenterology;  Laterality: N/A;  . ESOPHAGOGASTRODUODENOSCOPY (EGD) WITH PROPOFOL N/A 12/12/2016   Procedure: ESOPHAGOGASTRODUODENOSCOPY (EGD) WITH PROPOFOL;  Surgeon: Lollie Sails, MD;  Location: [facility] ENDOSCOPY;  Service: Endoscopy;  Laterality: N/A;  . ESOPHAGOGASTRODUODENOSCOPY (EGD) WITH PROPOFOL N/A 07/14/2019   Procedure:  ESOPHAGOGASTRODUODENOSCOPY (EGD) WITH PROPOFOL;  Surgeon: Toledo, Benay Pike, MD;  Location: [facility] ENDOSCOPY;  Service: Gastroenterology;  Laterality: N/A;  . [place]   right  . ORIF ANKLE FRACTURE Right   . TONSILLECTOMY  1948  . TUBAL LIGATION  1971  . VAGINAL HYSTERECTOMY  1985   d/t heavy bleeding    Medical History: Past Medical History:  Diagnosis Date  . Anxiety   . Coronary artery disease    . Gastritis   . GERD (gastroesophageal reflux disease)   . History of colonic polyps   . Hyperlipemia   . Hypertension   . Irregular heart beat   . Pre-diabetes   . Prediabetes   . Redundant colon   . Renal cyst    bilateral    Family History: Family History  Problem Relation Age of Onset  . Heart failure Mother   . Stroke Mother   . Breast cancer Mother        dx at age 27 yo  . Diabetes Mother   . Osteoporosis Mother   . Emphysema Father   . Ovarian cancer Sister        dx at age 38 yo  . Asthma Sister     Social History   Socioeconomic History  . Marital status: Widowed    Spouse name: Not on file  . Number of children: Not on file  . Years of education: Not on file  . Highest education level: Not on file  Occupational History  . Not on file  Tobacco Use  . Smoking status: Former Research scientist (life sciences)  . Smokeless tobacco: Never Used  Vaping Use  . Vaping Use: Never used  Substance and Sexual Activity  . Alcohol use: No    Alcohol/week: 0.0 standard drinks  . Drug use: Never  . Sexual activity: Not Currently  Other Topics Concern  . Not on file  Social History Narrative  . Not on file   Social Determinants of Health   Financial Resource Strain: Low Risk   . Difficulty of Paying Living Expenses: Not hard at all  Food Insecurity: No Food Insecurity  . Worried About Charity fundraiser in the Last Year: Never true  . Ran Out of Food in the Last Year: Never true  Transportation Needs: No Transportation Needs  . Lack of Transportation (Medical): No  . Lack of Transportation (Non-Medical): No  Physical Activity: Inactive  . Days of Exercise per Week: 0 days  . Minutes of Exercise per Session: 0 min  Stress: No Stress Concern Present  . Feeling of Stress : Not at all  Social Connections: Socially Isolated  . Frequency of Communication with Friends and Family: Three times a week  . Frequency of Social Gatherings with Friends and Family: Three times a week  .  Attends Religious Services: Never  . Active Member of Clubs or Organizations: No  . Attends Archivist Meetings: Never  . Marital Status: Widowed  Intimate Partner Violence: Not At Risk  . Fear of Current or Ex-Partner: No  . Emotionally Abused: No  . Physically Abused: No  . Sexually Abused: No      Review of Systems  Constitutional: Negative for activity change, chills, fatigue and unexpected weight change.  HENT: Negative for congestion, postnasal drip, rhinorrhea, sinus pressure, sinus pain, sneezing and sore throat.   Respiratory: Negative for cough, chest tightness, shortness of breath and wheezing.   Cardiovascular: Negative for chest pain and palpitations.  Blood pressure mildly elevated upon arrival.   Gastrointestinal: Negative for abdominal pain, constipation, diarrhea, nausea and vomiting.  Endocrine: Negative for cold intolerance, heat intolerance, polydipsia and polyuria.  Musculoskeletal: Negative for arthralgias, back pain, gait problem, joint swelling and neck pain.       Improved pain in left hip. Has appointment with orthopedics later this afternoon.   Skin: Negative for rash.  Allergic/Immunologic: Negative for environmental allergies.  Neurological: Positive for headaches. Negative for dizziness, tremors and numbness.  Hematological: Negative for adenopathy. Does not bruise/bleed easily.  Psychiatric/Behavioral: Negative for behavioral problems (Depression), dysphoric mood, sleep disturbance and suicidal ideas. The patient is nervous/anxious.     Today's Vitals   11/15/19 1146  BP: (!) 152/78  Pulse: 70  Resp: 16  Temp: (!) 97.2 F (36.2 C)  SpO2: 98%  Weight: 184 lb 3.2 oz (83.6 kg)  Height: 5\' 5"  (1.651 m)   Body mass index is 30.65 kg/m.  Physical Exam Vitals and nursing note reviewed.  Constitutional:      General: She is not in acute distress.    Appearance: Normal appearance. She is well-developed. She is not diaphoretic.   HENT:     Head: Normocephalic and atraumatic.     Nose: Nose normal.     Mouth/Throat:     Pharynx: No oropharyngeal exudate.  Eyes:     Conjunctiva/sclera: Conjunctivae normal.     Pupils: Pupils are equal, round, and reactive to light.  Neck:     Thyroid: No thyromegaly.     Vascular: No carotid bruit or JVD.     Trachea: No tracheal deviation.  Cardiovascular:     Rate and Rhythm: Normal rate and regular rhythm.     Heart sounds: Normal heart sounds. No murmur heard.  No friction rub. No gallop.   Pulmonary:     Effort: Pulmonary effort is normal. No respiratory distress.     Breath sounds: Normal breath sounds. No wheezing or rales.  Chest:     Chest wall: No tenderness.  Abdominal:     Palpations: Abdomen is soft.  Musculoskeletal:        General: Normal range of motion.     Cervical back: Normal range of motion and neck supple.  Lymphadenopathy:     Cervical: No cervical adenopathy.  Skin:    General: Skin is warm and dry.  Neurological:     General: No focal deficit present.     Mental Status: She is alert and oriented to person, place, and time.     Cranial Nerves: No cranial nerve deficit.  Psychiatric:        Mood and Affect: Mood normal.        Behavior: Behavior normal.        Thought Content: Thought content normal.        Judgment: Judgment normal.    Assessment/Plan: 1. Benign essential HTN Blood pressure stable. Continue medication as prescribed   2. Mixed hyperlipidemia Continue current dose of rosuvastatin as prescribed   3. Irritable bowel syndrome with constipation Reviewed results of recent endoscopy and screening colonoscopy.   4. Generalized anxiety disorder May continue to take clonazepam as needed and as prescribed. Will provided refills as needed.   General Counseling: Ninette verbalizes understanding of the findings of todays visit and agrees with plan of treatment. I have discussed any further diagnostic evaluation that may be needed  or ordered today. We also reviewed her medications today. she has been encouraged to call the  office with any questions or concerns that should arise related to todays visit.   This patient was seen by Leretha Pol FNP Collaboration with Dr Lavera Guise as a part of collaborative care agreement  Total time spent: 25 Minutes   Time spent includes review of chart, medications, test results, and follow up plan with the patient.      Dr Lavera Guise Internal medicine

## 2019-11-22 ENCOUNTER — Other Ambulatory Visit: Payer: Self-pay

## 2019-11-22 DIAGNOSIS — E559 Vitamin D deficiency, unspecified: Secondary | ICD-10-CM

## 2019-11-22 MED ORDER — ERGOCALCIFEROL 1.25 MG (50000 UT) PO CAPS: 50000.0000 [IU] | ORAL_CAPSULE | ORAL | 2 refills | Status: DC

## 2019-12-06 ENCOUNTER — Encounter: Payer: Self-pay | Admitting: Nurse Practitioner

## 2019-12-29 NOTE — Progress Notes (Deleted)
Pt present for annual exam. Pt stated  

## 2019-12-30 ENCOUNTER — Encounter: Payer: Medicare Other | Admitting: Obstetrics and Gynecology

## 2019-12-30 ENCOUNTER — Telehealth: Payer: Self-pay

## 2019-12-30 NOTE — Telephone Encounter (Signed)
Confirmed and screened for 01-04-20 ov.

## 2019-12-31 ENCOUNTER — Other Ambulatory Visit: Payer: Self-pay

## 2019-12-31 ENCOUNTER — Encounter: Payer: Self-pay | Admitting: Nurse Practitioner

## 2019-12-31 ENCOUNTER — Ambulatory Visit (INDEPENDENT_AMBULATORY_CARE_PROVIDER_SITE_OTHER): Payer: Medicare Other | Admitting: Nurse Practitioner

## 2019-12-31 VITALS — BP 151/89 | HR 71 | Temp 97.5°F | Resp 16 | Ht 65.0 in | Wt 184.0 lb

## 2019-12-31 DIAGNOSIS — M273 Alveolitis of jaws: Secondary | ICD-10-CM

## 2019-12-31 DIAGNOSIS — I10 Essential (primary) hypertension: Secondary | ICD-10-CM | POA: Diagnosis not present

## 2019-12-31 MED ORDER — CHLORHEXIDINE GLUCONATE 0.12 % MT SOLN: 15.0000 mL | Freq: Two times a day (BID) | OROMUCOSAL | 0 refills | Status: DC

## 2019-12-31 MED ORDER — DOXYCYCLINE HYCLATE 100 MG PO TABS: 100.0000 mg | ORAL_TABLET | Freq: Two times a day (BID) | ORAL | 0 refills | Status: DC

## 2019-12-31 NOTE — Progress Notes (Signed)
[facility] [place], [place] [address]  Internal MEDICINE  Office Visit Note  Patient Name: Amanda Duke  [id number]  [id number]  Date of Service: 01/18/2020   Pt is here for a sick visit.  Chief Complaint  Patient presents with  . Acute Visit  . Dental Pain    bottom left   . Sinusitis    drainage     The patient presents for acute visit. She has a great deal of tenderness in the lower left jaw. States that her tooth is hurting her. This is especially bad when she is trying to rest or when she is trying to eat. She does not have a regular dentist. Knows it will take some time to get in to see one. Knows they will likely put her on antibiotics first. Symptoms have been present for several days and getting worse. She denies fever or chills. States that it is hard to eat due to the pain in her jaw.        Current Medication:  Outpatient Encounter Medications as of 12/31/2019  Medication Sig  . cholecalciferol (VITAMIN D) 1000 units tablet Take 1,000 Units by mouth daily.  . ergocalciferol (DRISDOL) 1.25 MG (50000 UT) capsule Take 1 capsule (50,000 Units total) by mouth once a week.  . fluticasone (FLONASE) 50 MCG/ACT nasal spray Place 2 sprays into the nose as needed.   Vanessa Kick Ethyl (VASCEPA) 1 g CAPS Take 1 g by mouth 2 (two) times daily.   Marland Kitchen linaclotide (LINZESS) 290 MCG CAPS capsule Take 1 capsule (290 mcg total) by mouth daily.  Marland Kitchen losartan (COZAAR) 100 MG tablet Take 100 mg by mouth daily.  . ondansetron (ZOFRAN) 4 MG tablet Take 1 tablet (4 mg total) by mouth every 8 (eight) hours as needed for nausea or vomiting.  . pantoprazole (PROTONIX) 40 MG tablet Take 40 mg by mouth daily.   . rosuvastatin (CRESTOR) 20 MG tablet Take 1 tablet (20 mg total) by mouth daily. (Patient taking differently: Take 40 mg by mouth daily. )  . [DISCONTINUED] clonazePAM (KLONOPIN) 0.5 MG tablet TAKE 1 TABLET BY MOUTH AT BEDTIME AS NEEDED INSOMNIA/ANXIETY  .  [DISCONTINUED] ibuprofen (ADVIL) 600 MG tablet Take 1 tablet (600 mg total) by mouth 2 (two) times daily as needed.  . carvedilol (COREG) 3.125 MG tablet Take 3.125 mg by mouth 2 (two) times daily with a meal.   . chlorhexidine (PERIDEX) 0.12 % solution Use as directed 15 mLs in the mouth or throat 2 (two) times daily.  Marland Kitchen diltiazem (CARDIZEM CD) 120 MG 24 hr capsule Take 120 mg by mouth daily.   Marland Kitchen doxycycline (VIBRA-TABS) 100 MG tablet Take 1 tablet (100 mg total) by mouth 2 (two) times daily.   No facility-administered encounter medications on file as of 12/31/2019.      Medical History: Past Medical History:  Diagnosis Date  . Anxiety   . Coronary artery disease   . Gastritis   . GERD (gastroesophageal reflux disease)   . History of colonic polyps   . Hyperlipemia   . Hypertension   . Irregular heart beat   . Pre-diabetes   . Prediabetes   . Redundant colon   . Renal cyst    bilateral     Today's Vitals   12/31/19 1409  BP: (!) 151/89  Pulse: 71  Resp: 16  Temp: (!) 97.5 F (36.4 C)  SpO2: 98%  Weight: 184 lb (83.5 kg)  Height: 5\' 5"  (  1.651 m)   Body mass index is 30.62 kg/m.  Review of Systems  Constitutional: Positive for fatigue. Negative for activity change, chills and unexpected weight change.  HENT: Positive for dental problem and facial swelling. Negative for congestion, rhinorrhea, sneezing and sore throat.        She has abscess tooth in the bottom left aspect of the mouth.   Respiratory: Negative for cough, chest tightness and shortness of breath.   Cardiovascular: Negative for chest pain and palpitations.  Gastrointestinal: Negative for abdominal pain, constipation, diarrhea, nausea and vomiting.  Musculoskeletal: Negative for arthralgias, back pain, joint swelling and neck pain.  Skin: Negative for rash.  Neurological: Positive for headaches. Negative for dizziness, tremors and numbness.  Hematological: Positive for adenopathy. Does not bruise/bleed  easily.  Psychiatric/Behavioral: Negative for behavioral problems (Depression), sleep disturbance and suicidal ideas. The patient is not nervous/anxious.     Physical Exam Vitals and nursing note reviewed.  Constitutional:      General: She is not in acute distress.    Appearance: Normal appearance. She is well-developed. She is not diaphoretic.  HENT:     Head: Normocephalic and atraumatic.     Mouth/Throat:     Dentition: Abnormal dentition. Dental tenderness, gingival swelling and dental caries present.     Pharynx: No oropharyngeal exudate.     Comments: There is redness and swelling of the gums of the lower left aspect of the mouth. There is decay and deterioration of two teeth in bottom left part of the mouth. Very tender.  Eyes:     Pupils: Pupils are equal, round, and reactive to light.  Neck:     Thyroid: No thyromegaly.     Vascular: No JVD.     Trachea: No tracheal deviation.     Comments: Mild left cervical lymphadenopathy.  Cardiovascular:     Rate and Rhythm: Normal rate and regular rhythm.     Heart sounds: Normal heart sounds. No murmur heard.  No friction rub. No gallop.   Pulmonary:     Effort: Pulmonary effort is normal. No respiratory distress.     Breath sounds: Normal breath sounds. No wheezing or rales.  Chest:     Chest wall: No tenderness.  Abdominal:     Palpations: Abdomen is soft.  Musculoskeletal:        General: Normal range of motion.     Cervical back: Normal range of motion and neck supple.  Lymphadenopathy:     Cervical: No cervical adenopathy.  Skin:    General: Skin is warm and dry.  Neurological:     General: No focal deficit present.     Mental Status: She is alert and oriented to person, place, and time.     Cranial Nerves: No cranial nerve deficit.  Psychiatric:        Mood and Affect: Mood normal.        Behavior: Behavior normal.        Thought Content: Thought content normal.        Judgment: Judgment normal.     Assessment/Plan:  1. Infection of tooth socket Start doxycycline 100mg  twice daily for 14 days. Use peridex mouth wash twice daily to help reduce bacteria in the mouth. Recommend she make appointment with dentist as soon as possible.  - doxycycline (VIBRA-TABS) 100 MG tablet; Take 1 tablet (100 mg total) by mouth 2 (two) times daily.  Dispense: 28 tablet; Refill: 0 - chlorhexidine (PERIDEX) 0.12 % solution; Use as directed  15 mLs in the mouth or throat 2 (two) times daily.  Dispense: 120 mL; Refill: 0  2. Benign essential HTN Generally stable. Likely elevated due to pain. Will continue to monitor closely.    General Counseling: Carlye verbalizes understanding of the findings of todays visit and agrees with plan of treatment. I have discussed any further diagnostic evaluation that may be needed or ordered today. We also reviewed her medications today. she has been encouraged to call the office with any questions or concerns that should arise related to todays visit.    Counseling:  This patient was seen by [place] with Dr Lavera Guise as a part of collaborative care agreement  Meds ordered this encounter  Medications  . doxycycline (VIBRA-TABS) 100 MG tablet    Sig: Take 1 tablet (100 mg total) by mouth 2 (two) times daily.    Dispense:  28 tablet    Refill:  0    Order Specific Question:   Supervising Provider    Answer:   Lavera Guise [[id number]]  . chlorhexidine (PERIDEX) 0.12 % solution    Sig: Use as directed 15 mLs in the mouth or throat 2 (two) times daily.    Dispense:  120 mL    Refill:  0    Order Specific Question:   Supervising Provider    Answer:   Lavera Guise [[id number]]    Time spent: 25 Minutes

## 2020-01-04 ENCOUNTER — Other Ambulatory Visit: Payer: Self-pay

## 2020-01-04 ENCOUNTER — Ambulatory Visit (INDEPENDENT_AMBULATORY_CARE_PROVIDER_SITE_OTHER): Payer: Medicare Other | Admitting: Nurse Practitioner

## 2020-01-04 ENCOUNTER — Encounter: Payer: Self-pay | Admitting: Nurse Practitioner

## 2020-01-04 VITALS — BP 148/80 | HR 75 | Temp 97.8°F | Resp 16 | Ht 65.0 in | Wt 185.0 lb

## 2020-01-04 DIAGNOSIS — I10 Essential (primary) hypertension: Secondary | ICD-10-CM

## 2020-01-04 DIAGNOSIS — M25552 Pain in left hip: Secondary | ICD-10-CM

## 2020-01-04 DIAGNOSIS — E782 Mixed hyperlipidemia: Secondary | ICD-10-CM | POA: Diagnosis not present

## 2020-01-04 DIAGNOSIS — F411 Generalized anxiety disorder: Secondary | ICD-10-CM

## 2020-01-04 DIAGNOSIS — I700 Atherosclerosis of aorta: Secondary | ICD-10-CM

## 2020-01-04 DIAGNOSIS — R300 Dysuria: Secondary | ICD-10-CM | POA: Diagnosis not present

## 2020-01-04 DIAGNOSIS — Z0001 Encounter for general adult medical examination with abnormal findings: Secondary | ICD-10-CM | POA: Diagnosis not present

## 2020-01-04 DIAGNOSIS — Z1231 Encounter for screening mammogram for malignant neoplasm of breast: Secondary | ICD-10-CM

## 2020-01-04 MED ORDER — CLONAZEPAM 0.5 MG PO TABS: ORAL_TABLET | ORAL | 3 refills | Status: DC

## 2020-01-04 MED ORDER — IBUPROFEN 600 MG PO TABS: 600.0000 mg | ORAL_TABLET | Freq: Two times a day (BID) | ORAL | 0 refills | Status: DC | PRN

## 2020-01-04 NOTE — Progress Notes (Signed)
[facility] [place], [place] [address]  Internal MEDICINE  Office Visit Note  Patient Name: Amanda Duke  [id number]  [id number]  Date of Service: 01/19/2020   Pt is here for routine health maintenance examination    Chief Complaint  Patient presents with  . Annual Exam  . Hypertension  . Hyperlipidemia  . Anxiety     The patient is here for health maintenance exam. She was seen last Thursday for infected tooth. She was started on doxycycline 100mg  twice daily. She states that she has felt a little nauseated from time to time, but does OK if she takes this with food. Feels like this is gradually improving. Has not yet scheduled to see a dentist. The patient has no new concerns or complaints. She had labs done in 07/2019. She has mild elevation of triglycerides and total cholesterol. She does take rosuvastatin 20mg  daily.  She does go to [facility] due to family history of breast and ovarian cancer. She will need to reschedule missed appointment from last week. She will be due to have screening mammogram in 01/2020.   Current Medication: Outpatient Encounter Medications as of 01/04/2020  Medication Sig  . chlorhexidine (PERIDEX) 0.12 % solution Use as directed 15 mLs in the mouth or throat 2 (two) times daily.  . cholecalciferol (VITAMIN D) 1000 units tablet Take 1,000 Units by mouth daily.  . clonazePAM (KLONOPIN) 0.5 MG tablet TAKE 1 TABLET BY MOUTH AT BEDTIME AS NEEDED INSOMNIA/ANXIETY  . doxycycline (VIBRA-TABS) 100 MG tablet Take 1 tablet (100 mg total) by mouth 2 (two) times daily.  . ergocalciferol (DRISDOL) 1.25 MG (50000 UT) capsule Take 1 capsule (50,000 Units total) by mouth once a week.  . fluticasone (FLONASE) 50 MCG/ACT nasal spray Place 2 sprays into the nose as needed.   Vanessa Kick Ethyl (VASCEPA) 1 g CAPS Take 1 g by mouth 2 (two) times daily.   Marland Kitchen linaclotide (LINZESS) 290 MCG CAPS capsule Take 1 capsule (290 mcg total) by mouth  daily.  Marland Kitchen losartan (COZAAR) 100 MG tablet Take 100 mg by mouth daily.  . ondansetron (ZOFRAN) 4 MG tablet Take 1 tablet (4 mg total) by mouth every 8 (eight) hours as needed for nausea or vomiting.  . pantoprazole (PROTONIX) 40 MG tablet Take 40 mg by mouth daily.   . rosuvastatin (CRESTOR) 20 MG tablet Take 1 tablet (20 mg total) by mouth daily. (Patient taking differently: Take 40 mg by mouth daily. )  . [DISCONTINUED] clonazePAM (KLONOPIN) 0.5 MG tablet TAKE 1 TABLET BY MOUTH AT BEDTIME AS NEEDED INSOMNIA/ANXIETY  . [DISCONTINUED] ibuprofen (ADVIL) 600 MG tablet Take 1 tablet (600 mg total) by mouth 2 (two) times daily as needed.  . carvedilol (COREG) 3.125 MG tablet Take 3.125 mg by mouth 2 (two) times daily with a meal.   . diltiazem (CARDIZEM CD) 120 MG 24 hr capsule Take 120 mg by mouth daily.    No facility-administered encounter medications on file as of 01/04/2020.    Surgical History: Past Surgical History:  Procedure Laterality Date  . APPENDECTOMY  1971  . ARTHRODESIS  1980  . BACK SURGERY  1980  . COLONOSCOPY WITH PROPOFOL N/A 12/12/2016   Procedure: COLONOSCOPY WITH PROPOFOL;  Surgeon: Lollie Sails, MD;  Location: [facility] ENDOSCOPY;  Service: Endoscopy;  Laterality: N/A;  . COLONOSCOPY WITH PROPOFOL N/A 07/14/2019   Procedure: COLONOSCOPY WITH PROPOFOL;  Surgeon: Toledo, Benay Pike, MD;  Location: [facility] ENDOSCOPY;  Service: Gastroenterology;  Laterality: N/A;  . ESOPHAGOGASTRODUODENOSCOPY (EGD) WITH PROPOFOL N/A 12/12/2016   Procedure: ESOPHAGOGASTRODUODENOSCOPY (EGD) WITH PROPOFOL;  Surgeon: Lollie Sails, MD;  Location: [facility] ENDOSCOPY;  Service: Endoscopy;  Laterality: N/A;  . ESOPHAGOGASTRODUODENOSCOPY (EGD) WITH PROPOFOL N/A 07/14/2019   Procedure: ESOPHAGOGASTRODUODENOSCOPY (EGD) WITH PROPOFOL;  Surgeon: Toledo, Benay Pike, MD;  Location: [facility] ENDOSCOPY;  Service: Gastroenterology;  Laterality: N/A;  . [place]   right  . ORIF  ANKLE FRACTURE Right   . TONSILLECTOMY  1948  . TUBAL LIGATION  1971  . VAGINAL HYSTERECTOMY  1985   d/t heavy bleeding    Medical History: Past Medical History:  Diagnosis Date  . Anxiety   . Coronary artery disease   . Gastritis   . GERD (gastroesophageal reflux disease)   . History of colonic polyps   . Hyperlipemia   . Hypertension   . Irregular heart beat   . Pre-diabetes   . Prediabetes   . Redundant colon   . Renal cyst    bilateral    Family History: Family History  Problem Relation Age of Onset  . Heart failure Mother   . Stroke Mother   . Breast cancer Mother        dx at age 22 yo  . Diabetes Mother   . Osteoporosis Mother   . Emphysema Father   . Ovarian cancer Sister        dx at age 34 yo  . Asthma Sister       Review of Systems  Constitutional: Negative for activity change, chills, fatigue and unexpected weight change.  HENT: Positive for dental problem. Negative for congestion, rhinorrhea, sneezing and sore throat.        Continues to take antibiotics for abscessed tooth. Doing better. Still needs to contact dentist.   Respiratory: Negative for cough, chest tightness, shortness of breath and wheezing.   Cardiovascular: Negative for chest pain and palpitations.  Gastrointestinal: Negative for abdominal pain, constipation, diarrhea, nausea and vomiting.  Endocrine: Negative for cold intolerance, heat intolerance, polydipsia and polyuria.  Genitourinary: Negative for dysuria, frequency and urgency.  Musculoskeletal: Negative for arthralgias, back pain, joint swelling and neck pain.  Skin: Negative for rash.  Allergic/Immunologic: Negative for environmental allergies.  Neurological: Negative for dizziness, tremors, numbness and headaches.  Hematological: Negative for adenopathy. Does not bruise/bleed easily.  Psychiatric/Behavioral: Negative for behavioral problems (Depression), sleep disturbance and suicidal ideas. The patient is nervous/anxious.      Today's Vitals   01/04/20 1409  BP: (!) 148/80  Pulse: 75  Resp: 16  Temp: 97.8 F (36.6 C)  SpO2: 97%  Weight: 185 lb (83.9 kg)  Height: 5\' 5"  (1.651 m)   Body mass index is 30.79 kg/m.  Physical Exam Vitals and nursing note reviewed.  Constitutional:      General: She is not in acute distress.    Appearance: Normal appearance. She is well-developed. She is not diaphoretic.  HENT:     Head: Normocephalic and atraumatic.     Nose: Nose normal.     Mouth/Throat:     Pharynx: No oropharyngeal exudate.  Eyes:     Conjunctiva/sclera: Conjunctivae normal.     Pupils: Pupils are equal, round, and reactive to light.  Neck:     Thyroid: No thyromegaly.     Vascular: No carotid bruit or JVD.     Trachea: No tracheal deviation.  Cardiovascular:     Rate and Rhythm: Normal rate and regular rhythm.  Pulses: Normal pulses.     Heart sounds: Normal heart sounds. No murmur heard.  No friction rub. No gallop.   Pulmonary:     Effort: Pulmonary effort is normal. No respiratory distress.     Breath sounds: Normal breath sounds. No wheezing or rales.  Chest:     Chest wall: No tenderness.  Abdominal:     General: Bowel sounds are normal.     Palpations: Abdomen is soft.     Tenderness: There is no abdominal tenderness.  Musculoskeletal:        General: Normal range of motion.     Cervical back: Normal range of motion and neck supple.  Lymphadenopathy:     Cervical: No cervical adenopathy.  Skin:    General: Skin is warm and dry.  Neurological:     General: No focal deficit present.     Mental Status: She is alert and oriented to person, place, and time.     Cranial Nerves: No cranial nerve deficit.  Psychiatric:        Mood and Affect: Mood normal.        Behavior: Behavior normal.        Thought Content: Thought content normal.        Judgment: Judgment normal.    Depression screen [facility] 2/9 01/04/2020 01/04/2020 11/15/2019 06/22/2019 04/06/2019  Decreased Interest 0 0 0  1 0  Down, Depressed, Hopeless 0 0 0 0 0  PHQ - 2 Score 0 0 0 1 0    Functional Status Survey: Is the patient deaf or have difficulty hearing?: Yes Does the patient have difficulty seeing, even when wearing glasses/contacts?: No Does the patient have difficulty concentrating, remembering, or making decisions?: No Does the patient have difficulty walking or climbing stairs?: No Does the patient have difficulty dressing or bathing?: No Does the patient have difficulty doing errands alone such as visiting a doctor's office or shopping?: No  MMSE - [place] Exam 01/04/2020 01/01/2019 12/15/2017  Orientation to time 5 5 5   Orientation to Place 5 5 5   Registration 3 3 3   Attention/ Calculation 5 5 5   Recall 3 3 3   Language- name 2 objects 2 2 2   Language- repeat 1 1 1   Language- follow 3 step command 3 3 3   Language- read & follow direction 1 1 1   Write a sentence 1 1 1   Copy design 1 1 1   Total score 30 30 30     Fall Risk  01/04/2020 01/04/2020 11/15/2019 06/22/2019 04/06/2019  Falls in the past year? 0 0 0 0 0  Number falls in past yr: - - - - -  Injury with Fall? - - - - -  Follow up Falls evaluation completed Falls evaluation completed - - -     LABS: Recent Results (from the past 2160 hour(s))  UA/M w/rflx Culture, Routine     Status: Abnormal   Collection Time: 01/04/20  4:24 PM   Specimen: Urine   Urine  Result Value Ref Range   Specific Gravity, UA 1.013 1.005 - 1.030   pH, UA 6.0 5.0 - 7.5   Color, UA Yellow Yellow   Appearance Ur Clear Clear   Leukocytes,UA 1+ (A) Negative   Protein,UA Negative Negative/Trace   Glucose, UA Negative Negative   Ketones, UA Negative Negative   RBC, UA Negative Negative   Bilirubin, UA Negative Negative   Urobilinogen, Ur 0.2 0.2 - 1.0 mg/dL   Nitrite, UA Negative Negative  Microscopic Examination See below:     Comment: Microscopic was indicated and was performed.   Urinalysis Reflex Comment     Comment: This specimen has  reflexed to a Urine Culture.  Microscopic Examination     Status: Abnormal   Collection Time: 01/04/20  4:24 PM   Urine  Result Value Ref Range   WBC, UA 6-10 (A) 0 - 5 /hpf   RBC None seen 0 - 2 /hpf   Epithelial Cells (non renal) 0-10 0 - 10 /hpf   Casts None seen None seen /lpf   Bacteria, UA None seen None seen/Few  Urine Culture, Reflex     Status: None   Collection Time: 01/04/20  4:24 PM   Urine  Result Value Ref Range   Urine Culture, Routine Final report    Organism ID, Bacteria No growth    Assessment/Plan: 1. Encounter for general adult medical examination with abnormal findings Annual health maintenance exam today.   2. Benign essential HTN Stable. Continue bp medication as prescribed .  3. Mixed hyperlipidemia Mild elevation of triglycerides and total cholesterol. Continue rosuvastatin as prescribed. Limit intake of fried and fatty foods and participate in regular exercise as tolerated.   4. Atherosclerosis of aorta ([place]) Noted on prior abdominal CT. Patient currently on rosuvastatin, blood pressure generally well managed. Will monitor closely.   5. Generalized anxiety disorder May take clonazepam 0.5mg  at bedtime as needed for acute anxiety. New prescription sent to her pharmacy today.  - clonazePAM (KLONOPIN) 0.5 MG tablet; TAKE 1 TABLET BY MOUTH AT BEDTIME AS NEEDED INSOMNIA/ANXIETY  Dispense: 30 tablet; Refill: 3  6. Encounter for screening mammogram for malignant neoplasm of breast - MM DIGITAL SCREENING BILATERAL; Future  7. Dysuria - UA/M w/rflx Culture, Routine  General Counseling: Raelee verbalizes understanding of the findings of todays visit and agrees with plan of treatment. I have discussed any further diagnostic evaluation that may be needed or ordered today. We also reviewed her medications today. she has been encouraged to call the office with any questions or concerns that should arise related to todays visit.    Counseling:  This patient was  seen by Leretha Pol FNP Collaboration with Dr Lavera Guise as a part of collaborative care agreement  Orders Placed This Encounter  Procedures  . Microscopic Examination  . Urine Culture, Reflex  . MM DIGITAL SCREENING BILATERAL  . UA/M w/rflx Culture, Routine    Meds ordered this encounter  Medications  . clonazePAM (KLONOPIN) 0.5 MG tablet    Sig: TAKE 1 TABLET BY MOUTH AT BEDTIME AS NEEDED INSOMNIA/ANXIETY    Dispense:  30 tablet    Refill:  3    Order Specific Question:   Supervising Provider    Answer:   Lavera Guise [[id number]]    Total time spent: 71 Minutes  Time spent includes review of chart, medications, test results, and follow up plan with the patient.     Lavera Guise, MD  Internal Medicine

## 2020-01-06 ENCOUNTER — Encounter: Payer: Self-pay | Admitting: Obstetrics and Gynecology

## 2020-01-07 LAB — UA/M W/RFLX CULTURE, ROUTINE
Bilirubin, UA: NEGATIVE
Glucose, UA: NEGATIVE
Ketones, UA: NEGATIVE
Nitrite, UA: NEGATIVE
Protein,UA: NEGATIVE
RBC, UA: NEGATIVE
Specific Gravity, UA: 1.013 (ref 1.005–1.030)
Urobilinogen, Ur: 0.2 mg/dL (ref 0.2–1.0)
pH, UA: 6 (ref 5.0–7.5)

## 2020-01-07 LAB — URINE CULTURE, REFLEX: Organism ID, Bacteria: NO GROWTH

## 2020-01-07 LAB — MICROSCOPIC EXAMINATION
Bacteria, UA: NONE SEEN
Casts: NONE SEEN /lpf
RBC, Urine: NONE SEEN /hpf (ref 0–2)

## 2020-01-12 DIAGNOSIS — I700 Atherosclerosis of aorta: Secondary | ICD-10-CM | POA: Insufficient documentation

## 2020-01-18 DIAGNOSIS — M273 Alveolitis of jaws: Secondary | ICD-10-CM | POA: Insufficient documentation

## 2020-01-19 DIAGNOSIS — Z1231 Encounter for screening mammogram for malignant neoplasm of breast: Secondary | ICD-10-CM | POA: Insufficient documentation

## 2020-01-19 DIAGNOSIS — Z0001 Encounter for general adult medical examination with abnormal findings: Secondary | ICD-10-CM | POA: Insufficient documentation

## 2020-01-24 ENCOUNTER — Other Ambulatory Visit: Payer: Self-pay

## 2020-01-24 DIAGNOSIS — M25552 Pain in left hip: Secondary | ICD-10-CM

## 2020-01-24 MED ORDER — IBUPROFEN 600 MG PO TABS: 600.0000 mg | ORAL_TABLET | Freq: Two times a day (BID) | ORAL | 0 refills | Status: DC | PRN

## 2020-02-07 DIAGNOSIS — R0602 Shortness of breath: Secondary | ICD-10-CM | POA: Diagnosis not present

## 2020-02-07 DIAGNOSIS — I10 Essential (primary) hypertension: Secondary | ICD-10-CM | POA: Diagnosis not present

## 2020-02-07 DIAGNOSIS — I071 Rheumatic tricuspid insufficiency: Secondary | ICD-10-CM | POA: Diagnosis not present

## 2020-02-07 DIAGNOSIS — R0600 Dyspnea, unspecified: Secondary | ICD-10-CM | POA: Diagnosis not present

## 2020-02-07 DIAGNOSIS — E782 Mixed hyperlipidemia: Secondary | ICD-10-CM | POA: Diagnosis not present

## 2020-02-16 DIAGNOSIS — Z9289 Personal history of other medical treatment: Secondary | ICD-10-CM | POA: Diagnosis not present

## 2020-02-16 DIAGNOSIS — Z1231 Encounter for screening mammogram for malignant neoplasm of breast: Secondary | ICD-10-CM | POA: Diagnosis not present

## 2020-03-02 ENCOUNTER — Other Ambulatory Visit: Payer: Self-pay

## 2020-03-02 ENCOUNTER — Ambulatory Visit (INDEPENDENT_AMBULATORY_CARE_PROVIDER_SITE_OTHER): Payer: Medicare Other | Admitting: Nurse Practitioner

## 2020-03-02 ENCOUNTER — Encounter: Payer: Self-pay | Admitting: Nurse Practitioner

## 2020-03-02 VITALS — BP 166/84 | HR 67 | Temp 97.2°F | Resp 16 | Ht 65.0 in | Wt 185.2 lb

## 2020-03-02 DIAGNOSIS — S76219A Strain of adductor muscle, fascia and tendon of unspecified thigh, initial encounter: Secondary | ICD-10-CM | POA: Diagnosis not present

## 2020-03-02 DIAGNOSIS — I10 Essential (primary) hypertension: Secondary | ICD-10-CM

## 2020-03-02 MED ORDER — TIZANIDINE HCL 2 MG PO TABS: ORAL_TABLET | ORAL | 0 refills | Status: DC

## 2020-03-02 NOTE — Progress Notes (Signed)
[facility] [place], [place] [address]  Internal MEDICINE  Office Visit Note  Patient Name: Amanda Duke  [id number]  [id number]  Date of Service: 03/25/2020   Pt is here for a sick visit.  Chief Complaint  Patient presents with  . Acute Visit  . Generalized Body Aches    bilateral groin pain  . Headache    slight     The patient presents for acute visit. She is having pain in both groin area. This is worse on right side than left. Pain comes and goes. Worse with exertion. Worse with flexing and extending at the hip joint. Denies injury or trauma to the hips or groin area.denies unusual physical activity.  Denies abdominal pain, dysuria, or frequency of urination. She states that this pain has been present for about a week. It is intermittent and has not changed        Current Medication:  Outpatient Encounter Medications as of 03/02/2020  Medication Sig  . chlorhexidine (PERIDEX) 0.12 % solution Use as directed 15 mLs in the mouth or throat 2 (two) times daily.  . cholecalciferol (VITAMIN D) 1000 units tablet Take 1,000 Units by mouth daily.  . clonazePAM (KLONOPIN) 0.5 MG tablet TAKE 1 TABLET BY MOUTH AT BEDTIME AS NEEDED INSOMNIA/ANXIETY  . ergocalciferol (DRISDOL) 1.25 MG (50000 UT) capsule Take 1 capsule (50,000 Units total) by mouth once a week.  . fluticasone (FLONASE) 50 MCG/ACT nasal spray Place 2 sprays into the nose as needed.   Marland Kitchen ibuprofen (ADVIL) 600 MG tablet Take 1 tablet (600 mg total) by mouth 2 (two) times daily as needed.  Vanessa Kick Ethyl (VASCEPA) 1 g CAPS Take 1 g by mouth 2 (two) times daily.   Marland Kitchen linaclotide (LINZESS) 290 MCG CAPS capsule Take 1 capsule (290 mcg total) by mouth daily.  Marland Kitchen losartan (COZAAR) 100 MG tablet Take 100 mg by mouth daily.  . ondansetron (ZOFRAN) 4 MG tablet Take 1 tablet (4 mg total) by mouth every 8 (eight) hours as needed for nausea or vomiting.  . pantoprazole (PROTONIX) 40 MG tablet Take 40  mg by mouth daily.   . rosuvastatin (CRESTOR) 20 MG tablet Take 1 tablet (20 mg total) by mouth daily. (Patient taking differently: Take 40 mg by mouth daily. )  . [DISCONTINUED] doxycycline (VIBRA-TABS) 100 MG tablet Take 1 tablet (100 mg total) by mouth 2 (two) times daily.  . carvedilol (COREG) 3.125 MG tablet Take 3.125 mg by mouth 2 (two) times daily with a meal.   . diltiazem (CARDIZEM CD) 120 MG 24 hr capsule Take 120 mg by mouth daily.   Marland Kitchen tiZANidine (ZANAFLEX) 2 MG tablet Take 1/2 to 1 tablet po QHS prn muscle pain   No facility-administered encounter medications on file as of 03/02/2020.      Medical History: Past Medical History:  Diagnosis Date  . Anxiety   . Coronary artery disease   . Gastritis   . GERD (gastroesophageal reflux disease)   . History of colonic polyps   . Hyperlipemia   . Hypertension   . Irregular heart beat   . Pre-diabetes   . Prediabetes   . Redundant colon   . Renal cyst    bilateral    Today's Vitals   03/02/20 1141  BP: (!) 166/84  Pulse: 67  Resp: 16  Temp: (!) 97.2 F (36.2 C)  SpO2: 97%  Weight: 185 lb 3.2 oz (84 kg)  Height: 5\' 5"  (1.651 m)  Body mass index is 30.82 kg/m.  Review of Systems  Constitutional: Negative for chills, fatigue and unexpected weight change.  HENT: Negative for congestion, postnasal drip, rhinorrhea, sneezing and sore throat.   Respiratory: Negative for cough, chest tightness and shortness of breath.   Cardiovascular: Negative for chest pain and palpitations.       Blood pressure elevated today.   Gastrointestinal: Negative for abdominal pain, constipation, diarrhea, nausea and vomiting.  Musculoskeletal: Negative for arthralgias, back pain, joint swelling and neck pain.       Bilateral groin pain, worse on right side than left.   Skin: Negative for rash.  Allergic/Immunologic: Negative for environmental allergies.  Neurological: Negative for dizziness, tremors, numbness and headaches.   Hematological: Negative for adenopathy. Does not bruise/bleed easily.  Psychiatric/Behavioral: Negative for behavioral problems (Depression), sleep disturbance and suicidal ideas. The patient is not nervous/anxious.     Physical Exam Vitals and nursing note reviewed.  Constitutional:      General: She is not in acute distress.    Appearance: Normal appearance. She is well-developed. She is not diaphoretic.  HENT:     Head: Normocephalic and atraumatic.     Nose: Nose normal.     Mouth/Throat:     Pharynx: No oropharyngeal exudate.  Eyes:     Pupils: Pupils are equal, round, and reactive to light.  Neck:     Thyroid: No thyromegaly.     Vascular: No carotid bruit or JVD.     Trachea: No tracheal deviation.  Cardiovascular:     Rate and Rhythm: Normal rate and regular rhythm.     Heart sounds: Normal heart sounds. No murmur heard.  No friction rub. No gallop.   Pulmonary:     Effort: No respiratory distress.     Breath sounds: No wheezing or rales.  Chest:     Chest wall: No tenderness.  Abdominal:     Palpations: Abdomen is soft.  Musculoskeletal:        General: Normal range of motion.     Cervical back: Normal range of motion and neck supple.     Comments: Bilateral groin tenderness, worse with flexion of the hip. No abnormalities or deformities noted. Hurts with walking. Patient walking with slow gait.   Lymphadenopathy:     Cervical: No cervical adenopathy.  Skin:    General: Skin is warm and dry.  Neurological:     General: No focal deficit present.     Mental Status: She is alert and oriented to person, place, and time.     Cranial Nerves: No cranial nerve deficit.  Psychiatric:        Mood and Affect: Mood normal.        Behavior: Behavior normal.        Thought Content: Thought content normal.        Judgment: Judgment normal.    Assessment/Plan: 1. Groin strain, unspecified laterality, initial encounter Add tizanidine 2mg  tablets. May take 1/2 to 1 tablet  in the evening as needed for muscle pain/tightness. Advised her to take with caution as this may cause her to have drowsiness and dizziness. Apply heat to effected areas as needed to relieve muscle pain and tightness. She may take tylenol and/or ibuprofen as needed and as indicated.  - tiZANidine (ZANAFLEX) 2 MG tablet; Take 1/2 to 1 tablet po QHS prn muscle pain  Dispense: 30 tablet; Refill: 0  2. Benign essential HTN Generally stable. Continue bp medication as prescribed.   General  Counseling: Graelyn verbalizes understanding of the findings of todays visit and agrees with plan of treatment. I have discussed any further diagnostic evaluation that may be needed or ordered today. We also reviewed her medications today. she has been encouraged to call the office with any questions or concerns that should arise related to todays visit.    Counseling:   This patient was seen by [place] with Dr Lavera Guise as a part of collaborative care agreement  Meds ordered this encounter  Medications  . tiZANidine (ZANAFLEX) 2 MG tablet    Sig: Take 1/2 to 1 tablet po QHS prn muscle pain    Dispense:  30 tablet    Refill:  0    Order Specific Question:   Supervising Provider    Answer:   Lavera Guise [[id number]]    Time spent: 25 Minutes

## 2020-03-11 IMAGING — CT CT ABDOMEN AND PELVIS WITH CONTRAST
2 of 5 series · 16 of 46 positions shown, 18 images · IV contrast (APPLIED)
Comparison: 09/27/2016

CLINICAL DATA: Abnormal LFTs.  Abdominal pain

EXAM:
CT ABDOMEN AND PELVIS WITH CONTRAST
TECHNIQUE: Multidetector CT imaging of the abdomen and pelvis was performed
using the standard protocol following bolus administration of
intravenous contrast.
CONTRAST:  100mL OMNIPAQUE IOHEXOL 300 MG/ML  SOLN

[Series 2: routine abd/pel with · axial · 0.86mm/px · z∈[-821,-411]mm · 13 of 94 slices shown, 15 images]
[im 6/94  soft-tissue]
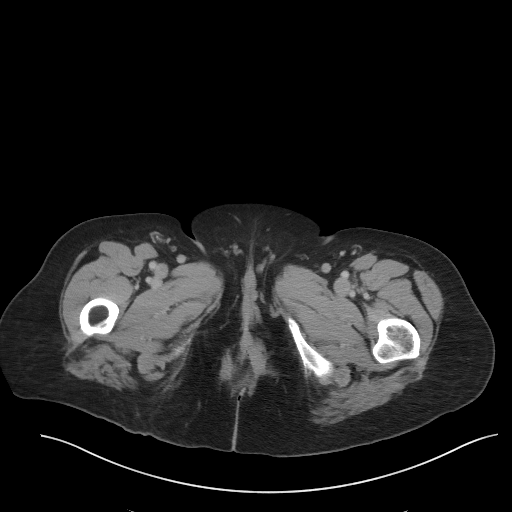
[im 6/94  bone]
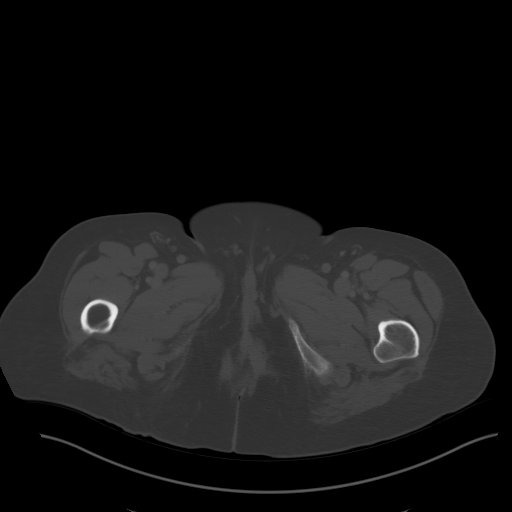
[im 11/94  soft-tissue]
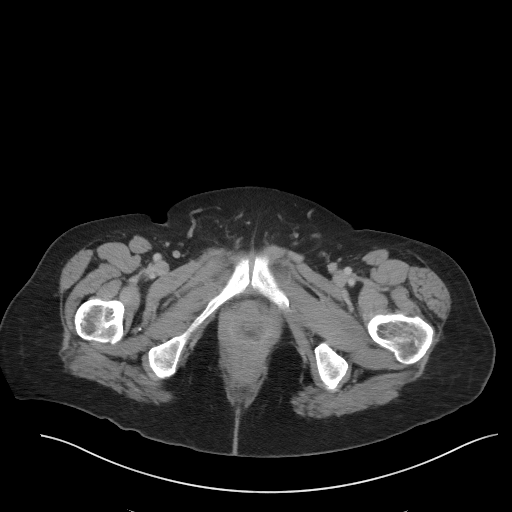
[im 21/94  soft-tissue]
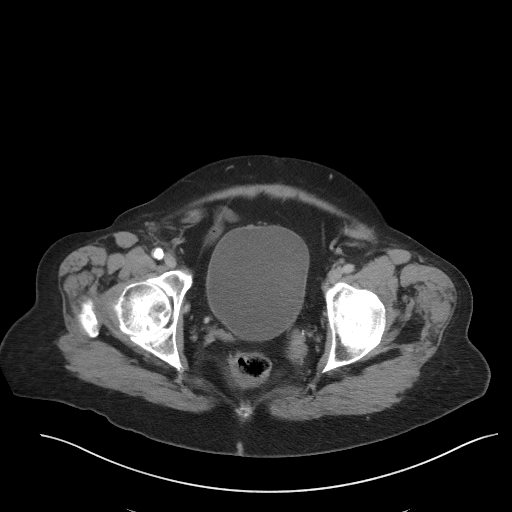
[im 26/94  soft-tissue]
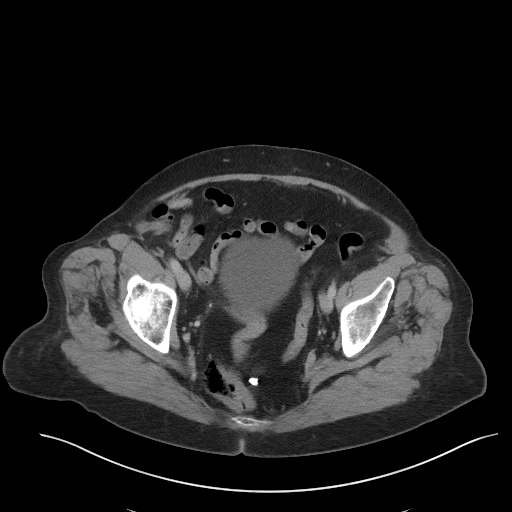
[im 32/94  soft-tissue]
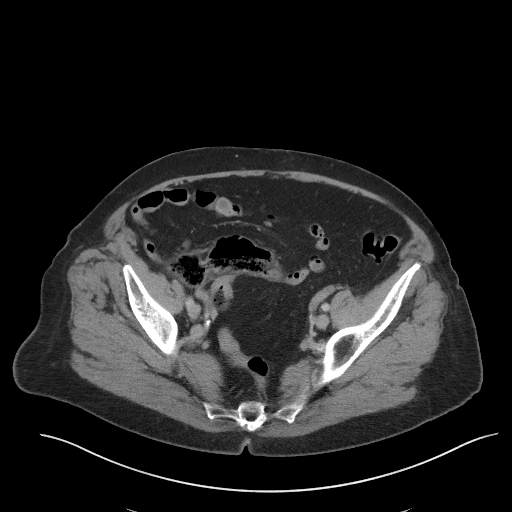
[im 42/94  soft-tissue]
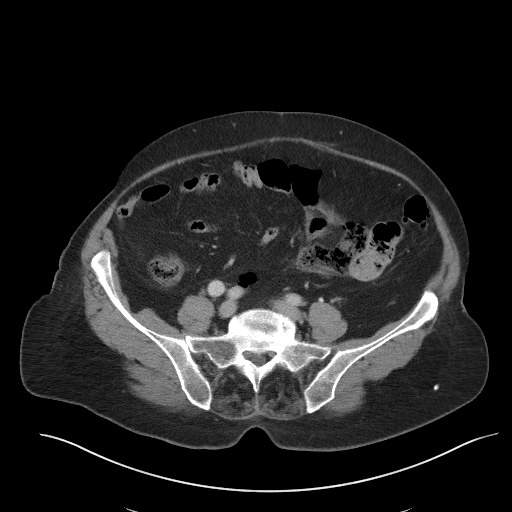
[im 47/94  soft-tissue]
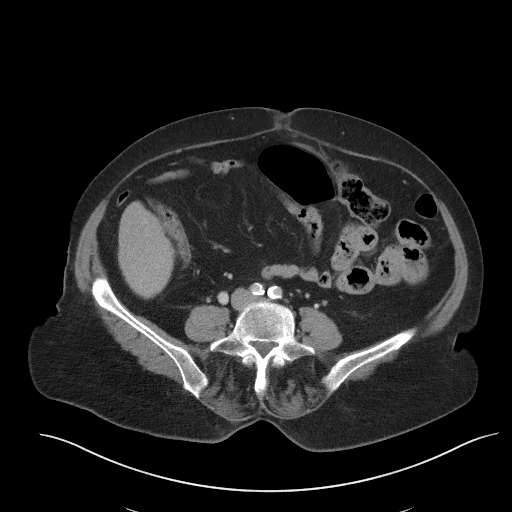
[im 52/94  soft-tissue]
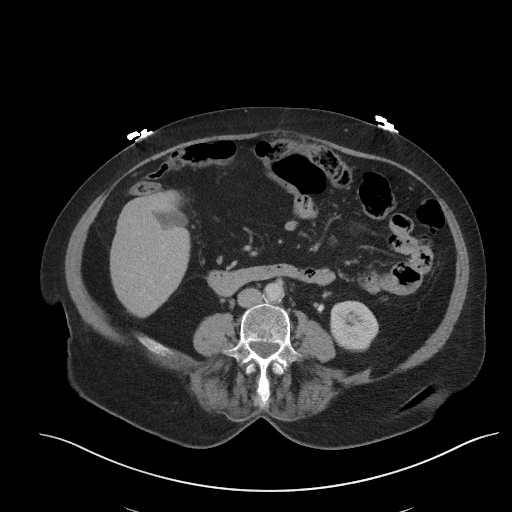
[im 63/94  soft-tissue]
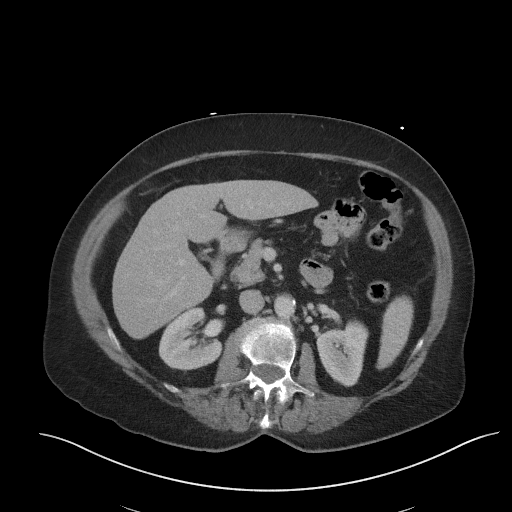
[im 63/94  bone]
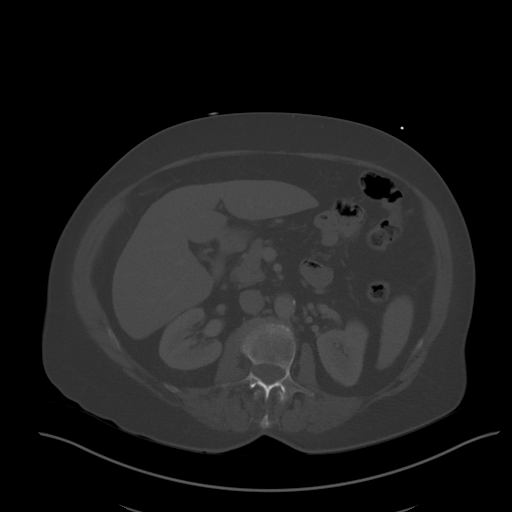
[im 68/94  soft-tissue]
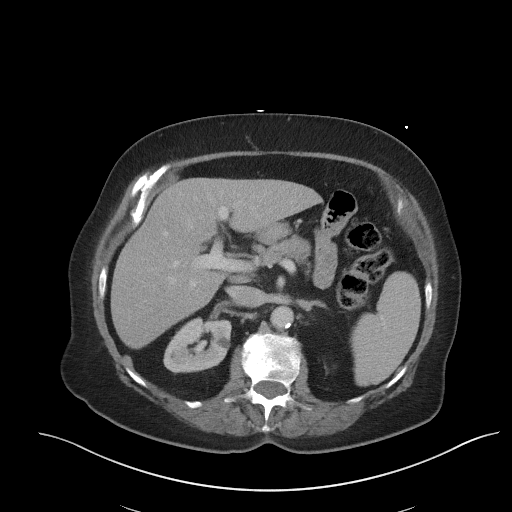
[im 73/94  soft-tissue]
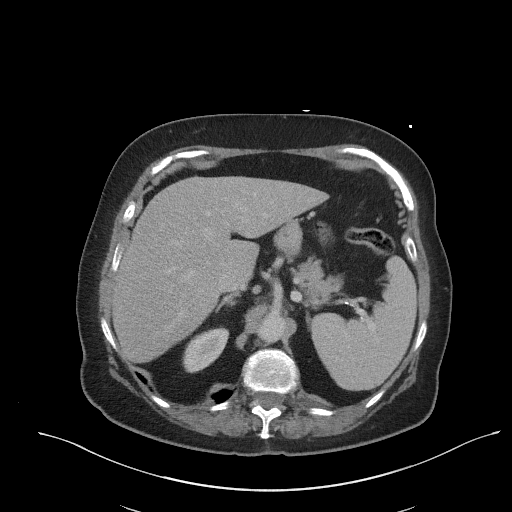
[im 83/94  soft-tissue]
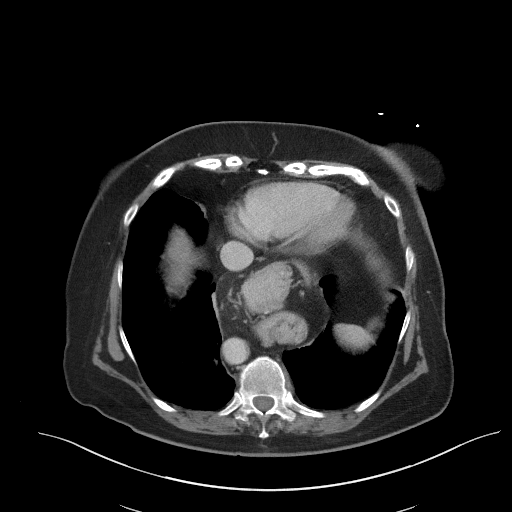
[im 88/94  soft-tissue]
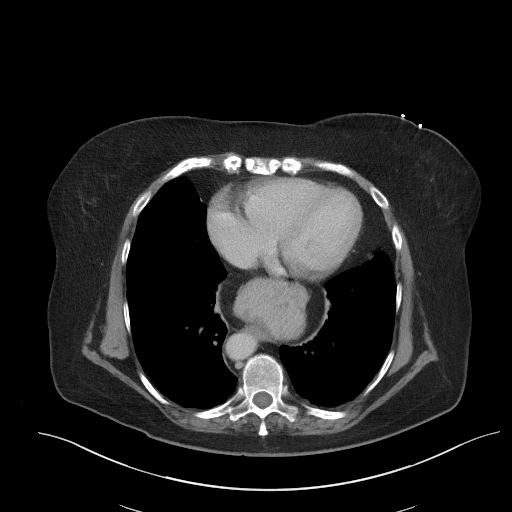

[Series 5: coronal st · coronal · 0.75mm/px · 3 of 100 slices shown]
[im 34/100  soft-tissue]
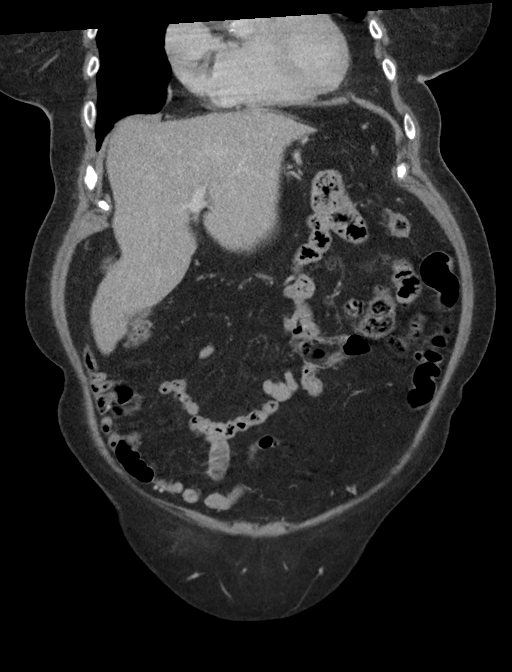
[im 45/100  soft-tissue]
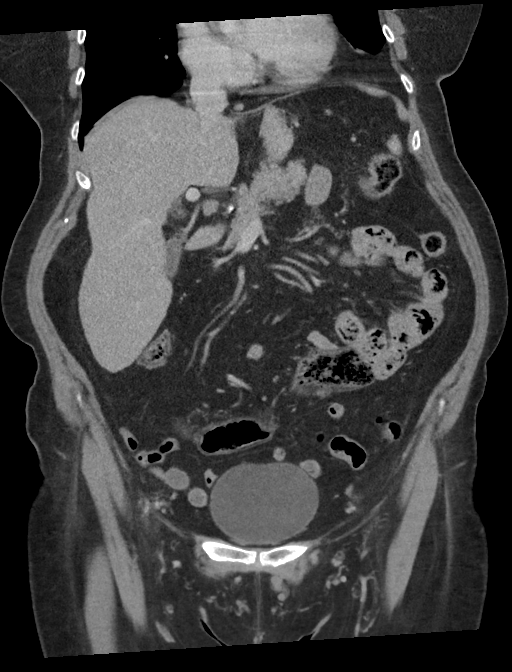
[im 56/100  soft-tissue]
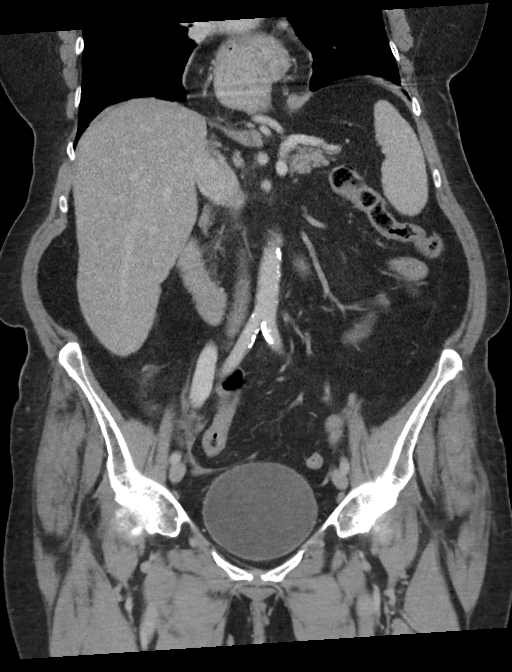

[16 of 46 positions shown; findings below may reference images not displayed]

FINDINGS: Lower chest: Moderate-sized hiatal hernia.  No acute abnormality.

Hepatobiliary: Diffuse low-density throughout the liver compatible
with fatty infiltration. No focal abnormality. Gallbladder
unremarkable.

Pancreas: No focal abnormality or ductal dilatation.

Spleen: No focal abnormality.  Normal size.

Adrenals/Urinary Tract: No renal or adrenal mass. No hydronephrosis.
Urinary bladder unremarkable.

Stomach/Bowel: There is mild wall thickening noted within the
ascending colon. The ascending colon and cecum cross the midline
into the left lower quadrant. Some areas of wall thickening appeared
to be fat density suggesting old/burned-out inflammation, likely
related to old inflammatory bowel disease. Appearance is similar to
prior study from 9016. Doubt acute process. No evidence of bowel
obstruction.

Vascular/Lymphatic: Aortic atherosclerosis. No enlarged abdominal or
pelvic lymph nodes.

Reproductive: Prior hysterectomy.  No adnexal masses.

Other: No free fluid or free air.

Musculoskeletal: No acute bony abnormality.
IMPRESSION: Moderate-sized hiatal hernia.

Mild fatty infiltration of the liver.

Fat density wall thickening within the cecum and ascending colon,
likely related to old inflammation/inflammatory bowel disease. No
definite acute process.

Aortic atherosclerosis.

## 2020-03-15 DIAGNOSIS — H2513 Age-related nuclear cataract, bilateral: Secondary | ICD-10-CM | POA: Diagnosis not present

## 2020-03-25 DIAGNOSIS — S76219A Strain of adductor muscle, fascia and tendon of unspecified thigh, initial encounter: Secondary | ICD-10-CM | POA: Insufficient documentation

## 2020-03-27 ENCOUNTER — Other Ambulatory Visit: Payer: Self-pay

## 2020-03-27 DIAGNOSIS — S76219A Strain of adductor muscle, fascia and tendon of unspecified thigh, initial encounter: Secondary | ICD-10-CM

## 2020-03-27 MED ORDER — TIZANIDINE HCL 2 MG PO TABS: ORAL_TABLET | ORAL | 0 refills | Status: DC

## 2020-03-30 DIAGNOSIS — I071 Rheumatic tricuspid insufficiency: Secondary | ICD-10-CM | POA: Diagnosis not present

## 2020-03-30 DIAGNOSIS — R0600 Dyspnea, unspecified: Secondary | ICD-10-CM | POA: Diagnosis not present

## 2020-03-30 DIAGNOSIS — I2510 Atherosclerotic heart disease of native coronary artery without angina pectoris: Secondary | ICD-10-CM | POA: Diagnosis not present

## 2020-03-30 DIAGNOSIS — R0602 Shortness of breath: Secondary | ICD-10-CM | POA: Diagnosis not present

## 2020-04-07 DIAGNOSIS — I2510 Atherosclerotic heart disease of native coronary artery without angina pectoris: Secondary | ICD-10-CM | POA: Diagnosis not present

## 2020-04-07 DIAGNOSIS — E782 Mixed hyperlipidemia: Secondary | ICD-10-CM | POA: Diagnosis not present

## 2020-04-07 DIAGNOSIS — I10 Essential (primary) hypertension: Secondary | ICD-10-CM | POA: Diagnosis not present

## 2020-04-07 DIAGNOSIS — I471 Supraventricular tachycardia: Secondary | ICD-10-CM | POA: Diagnosis not present

## 2020-04-12 ENCOUNTER — Encounter: Payer: Self-pay | Admitting: Obstetrics and Gynecology

## 2020-04-12 ENCOUNTER — Other Ambulatory Visit: Payer: Self-pay

## 2020-04-12 ENCOUNTER — Ambulatory Visit (INDEPENDENT_AMBULATORY_CARE_PROVIDER_SITE_OTHER): Payer: Medicare Other | Admitting: Obstetrics and Gynecology

## 2020-04-12 VITALS — BP 162/88 | HR 80 | Ht 65.0 in | Wt 181.6 lb

## 2020-04-12 DIAGNOSIS — R32 Unspecified urinary incontinence: Secondary | ICD-10-CM

## 2020-04-12 DIAGNOSIS — Z8041 Family history of malignant neoplasm of ovary: Secondary | ICD-10-CM

## 2020-04-12 DIAGNOSIS — Z01419 Encounter for gynecological examination (general) (routine) without abnormal findings: Secondary | ICD-10-CM | POA: Diagnosis not present

## 2020-04-12 DIAGNOSIS — Z803 Family history of malignant neoplasm of breast: Secondary | ICD-10-CM

## 2020-04-12 DIAGNOSIS — Z87440 Personal history of urinary (tract) infections: Secondary | ICD-10-CM

## 2020-04-12 DIAGNOSIS — M25551 Pain in right hip: Secondary | ICD-10-CM

## 2020-04-12 DIAGNOSIS — R399 Unspecified symptoms and signs involving the genitourinary system: Secondary | ICD-10-CM

## 2020-04-12 DIAGNOSIS — N819 Female genital prolapse, unspecified: Secondary | ICD-10-CM

## 2020-04-12 DIAGNOSIS — M8589 Other specified disorders of bone density and structure, multiple sites: Secondary | ICD-10-CM

## 2020-04-12 DIAGNOSIS — N993 Prolapse of vaginal vault after hysterectomy: Secondary | ICD-10-CM

## 2020-04-12 LAB — POCT URINALYSIS DIPSTICK
Blood, UA: NEGATIVE
Glucose, UA: NEGATIVE
Ketones, UA: NEGATIVE
Nitrite, UA: POSITIVE
Protein, UA: POSITIVE — AB
Spec Grav, UA: 1.025 (ref 1.010–1.025)
Urobilinogen, UA: 0.2 E.U./dL
pH, UA: 6 (ref 5.0–8.0)

## 2020-04-12 NOTE — Patient Instructions (Signed)
Health Maintenance After Age 79 After age 79, you are at a higher risk for certain long-term diseases and infections as well as injuries from falls. Falls are a major cause of broken bones and head injuries in people who are older than age 79. Getting regular preventive care can help to keep you healthy and well. Preventive care includes getting regular testing and making lifestyle changes as recommended by your health care provider. Talk with your health care provider about:  Which screenings and tests you should have. A screening is a test that checks for a disease when you have no symptoms.  A diet and exercise plan that is right for you. What should I know about screenings and tests to prevent falls? Screening and testing are the best ways to find a health problem early. Early diagnosis and treatment give you the best chance of managing medical conditions that are common after age 79. Certain conditions and lifestyle choices may make you more likely to have a fall. Your health care provider may recommend:  Regular vision checks. Poor vision and conditions such as cataracts can make you more likely to have a fall. If you wear glasses, make sure to get your prescription updated if your vision changes.  Medicine review. Work with your health care provider to regularly review all of the medicines you are taking, including over-the-counter medicines. Ask your health care provider about any side effects that may make you more likely to have a fall. Tell your health care provider if any medicines that you take make you feel dizzy or sleepy.  Osteoporosis screening. Osteoporosis is a condition that causes the bones to get weaker. This can make the bones weak and cause them to break more easily.  Blood pressure screening. Blood pressure changes and medicines to control blood pressure can make you feel dizzy.  Strength and balance checks. Your health care provider may recommend certain tests to check your  strength and balance while standing, walking, or changing positions.  Foot health exam. Foot pain and numbness, as well as not wearing proper footwear, can make you more likely to have a fall.  Depression screening. You may be more likely to have a fall if you have a fear of falling, feel emotionally low, or feel unable to do activities that you used to do.  Alcohol use screening. Using too much alcohol can affect your balance and may make you more likely to have a fall. What actions can I take to lower my risk of falls? General instructions  Talk with your health care provider about your risks for falling. Tell your health care provider if: ? You fall. Be sure to tell your health care provider about all falls, even ones that seem minor. ? You feel dizzy, sleepy, or off-balance.  Take over-the-counter and prescription medicines only as told by your health care provider. These include any supplements.  Eat a healthy diet and maintain a healthy weight. A healthy diet includes low-fat dairy products, low-fat (lean) meats, and fiber from whole grains, beans, and lots of fruits and vegetables. Home safety  Remove any tripping hazards, such as rugs, cords, and clutter.  Install safety equipment such as grab bars in bathrooms and safety rails on stairs.  Keep rooms and walkways well-lit. Activity   Follow a regular exercise program to stay fit. This will help you maintain your balance. Ask your health care provider what types of exercise are appropriate for you.  If you need a cane or   walker, use it as recommended by your health care provider.  Wear supportive shoes that have nonskid soles. Lifestyle  Do not drink alcohol if your health care provider tells you not to drink.  If you drink alcohol, limit how much you have: ? 0-1 drink a day for women. ? 0-2 drinks a day for men.  Be aware of how much alcohol is in your drink. In the [place] one drink equals one typical bottle of beer (12  oz), one-half glass of wine (5 oz), or one shot of hard liquor (1 oz).  Do not use any products that contain nicotine or tobacco, such as cigarettes and e-cigarettes. If you need help quitting, ask your health care provider. Summary  Having a healthy lifestyle and getting preventive care can help to protect your health and wellness after age 50.  Screening and testing are the best way to find a health problem early and help you avoid having a fall. Early diagnosis and treatment give you the best chance for managing medical conditions that are more common for people who are older than age 16.  Falls are a major cause of broken bones and head injuries in people who are older than age 22. Take precautions to prevent a fall at home.  Work with your health care provider to learn what changes you can make to improve your health and wellness and to prevent falls. This information is not intended to replace advice given to you by your health care provider. Make sure you discuss any questions you have with your health care provider. Document Revised: 08/06/2018 Document Reviewed: 02/26/2017 Elsevier Patient Education  2020 [place] Breast self-awareness means being familiar with how your breasts look and feel. It involves checking your breasts regularly and reporting any changes to your health care provider. Practicing breast self-awareness is important. Sometimes changes may not be harmful (are benign), but sometimes a change in your breasts can be a sign of a serious medical problem. It is important to learn how to do this procedure correctly so that you can catch problems early, when treatment is more likely to be successful. All women should practice breast self-awareness, including women who have had breast implants. What you need:  A mirror.  A well-lit room. How to do a breast self-exam A breast self-exam is one way to learn what is normal for your breasts and  whether your breasts are changing. To do a breast self-exam: Look for changes  1. Remove all the clothing above your waist. 2. Stand in front of a mirror in a room with good lighting. 3. Put your hands on your hips. 4. Push your hands firmly downward. 5. Compare your breasts in the mirror. Look for differences between them (asymmetry), such as: ? Differences in shape. ? Differences in size. ? Puckers, dips, and bumps in one breast and not the other. 6. Look at each breast for changes in the skin, such as: ? Redness. ? Scaly areas. 7. Look for changes in your nipples, such as: ? Discharge. ? Bleeding. ? Dimpling. ? Redness. ? A change in position. Feel for changes Carefully feel your breasts for lumps and changes. It is best to do this while lying on your back on the floor, and again while sitting or standing in the tub or shower with soapy water on your skin. Feel each breast in the following way: 1. Place the arm on the side of the breast you are examining  above your head. 2. Feel your breast with the other hand. 3. Start in the nipple area and make -inch (2 cm) overlapping circles to feel your breast. Use the pads of your three middle fingers to do this. Apply light pressure, then medium pressure, then firm pressure. The light pressure will allow you to feel the tissue closest to the skin. The medium pressure will allow you to feel the tissue that is a little deeper. The firm pressure will allow you to feel the tissue close to the ribs. 4. Continue the overlapping circles, moving downward over the breast until you feel your ribs below your breast. 5. Move one finger-width toward the center of the body. Continue to use the -inch (2 cm) overlapping circles to feel your breast as you move slowly up toward your collarbone. 6. Continue the up-and-down exam using all three pressures until you reach your armpit.  Write down what you find Writing down what you find can help you remember  what to discuss with your health care provider. Write down:  What is normal for each breast.  Any changes that you find in each breast, including: ? The kind of changes you find. ? Any pain or tenderness. ? Size and location of any lumps.  Where you are in your menstrual cycle, if you are still menstruating. General tips and recommendations  Examine your breasts every month.  If you are breastfeeding, the best time to examine your breasts is after a feeding or after using a breast pump.  If you menstruate, the best time to examine your breasts is 5-7 days after your period. Breasts are generally lumpier during menstrual periods, and it may be more difficult to notice changes.  With time and practice, you will become more familiar with the variations in your breasts and more comfortable with the exam. Contact a health care provider if you:  See a change in the shape or size of your breasts or nipples.  See a change in the skin of your breast or nipples, such as a reddened or scaly area.  Have unusual discharge from your nipples.  Find a lump or thick area that was not there before.  Have pain in your breasts.  Have any concerns related to your breast health. Summary  Breast self-awareness includes looking for physical changes in your breasts, as well as feeling for any changes within your breasts.  Breast self-awareness should be performed in front of a mirror in a well-lit room.  You should examine your breasts every month. If you menstruate, the best time to examine your breasts is 5-7 days after your menstrual period.  Let your health care provider know of any changes you notice in your breasts, including changes in size, changes on the skin, pain or tenderness, or unusual fluid from your nipples. This information is not intended to replace advice given to you by your health care provider. Make sure you discuss any questions you have with your health care  provider. Document Revised: 12/02/2017 Document Reviewed: 12/02/2017 Elsevier Patient Education  [place].

## 2020-04-12 NOTE — Progress Notes (Signed)
ANNUAL PREVENTATIVE CARE GYNECOLOGY  ENCOUNTER NOTE  Subjective:       Amanda Duke is a 79 y.o. [phone] female here for a routine annual gynecologic exam.  The patient is not sexually active. The patient is not taking hormone replacement therapy. Patient denies post-menopausal vaginal bleeding. The patient wears seatbelts: yes. The patient participates in regular exercise: no (due to hip pain).   Patient undergoes yearly surveillance for ovarian cancer due to family history (in sister in her 2's). She also has a family history of breast cancer in her mother.  Has tested negative for BRCA.  Has left ovary and part of right ovary remaining after remote hysterectomy.    Current complaints: 1.  Reports right hip pain. Pain also goes into the groin area. Denies any excessive physical activity.  Thinks it may arthritis. Ibuprofen helps (takes ~ every 3 days or so). Also noting lower back and pelvic pain.    Gynecologic History No LMP recorded. Patient has had a hysterectomy. Contraception: status post hysterectomy Last Pap: Not needed. Patient beyond recommended age of screening.  Last mammogram: 02/16/2020. Results were: normal (reviewed in Care Everywhere) Last Colonoscopy:07/14/2019. Reports benign polyps removed. No further follow up needed.  Last Dexa Scan: 02/15/2019. Osteopenia present (T score -2.0 in lumbar spine and  femoral neck)   Obstetric History OB History  Gravida Para Term Preterm AB Living  $Remov'8 7 7   1 7  'juVvsS$ SAB IAB Ectopic Multiple Live Births  1       7    # Outcome Date GA Lbr Len/2nd Weight Sex Delivery Anes PTL Lv  8 Term 1971   6 lb 14 oz (3.118 kg)  Vag-Spont   LIV  7 Term 1969   6 lb 13 oz (3.09 kg) M Vag-Spont   LIV  6 Term 1965   6 lb 11 oz (3.033 kg) F Vag-Spont   LIV  5 Term 1964   6 lb 15 oz (3.147 kg) M Vag-Spont   LIV  4 Term 1963   6 lb 11 oz (3.033 kg) F Vag-Spont   LIV     Complications: Hemorrhage  3 Term 1962   5 lb 11 oz (2.58 kg) F Vag-Spont    LIV  2 SAB 1961        FD  1 Term 1960   6 lb 14 oz (3.118 kg) M Vag-Spont   LIV    Past Medical History:  Diagnosis Date  . Anxiety   . Coronary artery disease   . Gastritis   . GERD (gastroesophageal reflux disease)   . History of colonic polyps   . Hyperlipemia   . Hypertension   . Irregular heart beat   . Pre-diabetes   . Prediabetes   . Redundant colon   . Renal cyst    bilateral    Family History  Problem Relation Age of Onset  . Heart failure Mother   . Stroke Mother   . Breast cancer Mother        dx at age 95 yo  . Diabetes Mother   . Osteoporosis Mother   . Emphysema Father   . Ovarian cancer Sister        dx at age 64 yo  . Asthma Sister     Past Surgical History:  Procedure Laterality Date  . APPENDECTOMY  1971  . ARTHRODESIS  1980  . BACK SURGERY  1980  . COLONOSCOPY WITH PROPOFOL N/A 12/12/2016  Procedure: COLONOSCOPY WITH PROPOFOL;  Surgeon: Lollie Sails, MD;  Location: [facility] ENDOSCOPY;  Service: Endoscopy;  Laterality: N/A;  . COLONOSCOPY WITH PROPOFOL N/A 07/14/2019   Procedure: COLONOSCOPY WITH PROPOFOL;  Surgeon: Toledo, Benay Pike, MD;  Location: [facility] ENDOSCOPY;  Service: Gastroenterology;  Laterality: N/A;  . ESOPHAGOGASTRODUODENOSCOPY (EGD) WITH PROPOFOL N/A 12/12/2016   Procedure: ESOPHAGOGASTRODUODENOSCOPY (EGD) WITH PROPOFOL;  Surgeon: Lollie Sails, MD;  Location: [facility] ENDOSCOPY;  Service: Endoscopy;  Laterality: N/A;  . ESOPHAGOGASTRODUODENOSCOPY (EGD) WITH PROPOFOL N/A 07/14/2019   Procedure: ESOPHAGOGASTRODUODENOSCOPY (EGD) WITH PROPOFOL;  Surgeon: Toledo, Benay Pike, MD;  Location: [facility] ENDOSCOPY;  Service: Gastroenterology;  Laterality: N/A;  . [place]   right  . ORIF ANKLE FRACTURE Right   . TONSILLECTOMY  1948  . TUBAL LIGATION  1971  . VAGINAL HYSTERECTOMY  1985   d/t heavy bleeding    Social History   Socioeconomic History  . Marital status: Widowed    Spouse name: Not on  file  . Number of children: Not on file  . Years of education: Not on file  . Highest education level: Not on file  Occupational History  . Not on file  Tobacco Use  . Smoking status: Former Research scientist (life sciences)  . Smokeless tobacco: Never Used  Vaping Use  . Vaping Use: Never used  Substance and Sexual Activity  . Alcohol use: No    Alcohol/week: 0.0 standard drinks  . Drug use: Never  . Sexual activity: Not Currently  Other Topics Concern  . Not on file  Social History Narrative  . Not on file   Social Determinants of Health   Financial Resource Strain: Not on file  Food Insecurity: Not on file  Transportation Needs: Not on file  Physical Activity: Not on file  Stress: Not on file  Social Connections: Not on file  Intimate Partner Violence: Not on file    Current Outpatient Medications on File Prior to Visit  Medication Sig Dispense Refill  . carvedilol (COREG) 3.125 MG tablet Take 3.125 mg by mouth 2 (two) times daily with a meal.     . chlorhexidine (PERIDEX) 0.12 % solution Use as directed 15 mLs in the mouth or throat 2 (two) times daily. 120 mL 0  . cholecalciferol (VITAMIN D) 1000 units tablet Take 1,000 Units by mouth daily.    . clonazePAM (KLONOPIN) 0.5 MG tablet TAKE 1 TABLET BY MOUTH AT BEDTIME AS NEEDED INSOMNIA/ANXIETY 30 tablet 3  . diltiazem (CARDIZEM CD) 120 MG 24 hr capsule Take 120 mg by mouth daily.     . ergocalciferol (DRISDOL) 1.25 MG (50000 UT) capsule Take 1 capsule (50,000 Units total) by mouth once a week. 4 capsule 2  . fluticasone (FLONASE) 50 MCG/ACT nasal spray Place 2 sprays into the nose as needed.     Marland Kitchen ibuprofen (ADVIL) 600 MG tablet Take 1 tablet (600 mg total) by mouth 2 (two) times daily as needed. 45 tablet 0  . icosapent Ethyl (VASCEPA) 1 g capsule Take 1 g by mouth 2 (two) times daily.     Marland Kitchen linaclotide (LINZESS) 290 MCG CAPS capsule Take 1 capsule (290 mcg total) by mouth daily. 30 capsule 3  . losartan (COZAAR) 100 MG tablet Take 100 mg by  mouth daily.    . pantoprazole (PROTONIX) 40 MG tablet Take 40 mg by mouth daily.     . rosuvastatin (CRESTOR) 20 MG tablet Take 1 tablet (20 mg total) by mouth daily. (Patient taking differently:  Take 40 mg by mouth daily.) 30 tablet 3  . tiZANidine (ZANAFLEX) 2 MG tablet Take 1/2 to 1 tablet po QHS prn muscle pain 30 tablet 0   No current facility-administered medications on file prior to visit.    Allergies  Allergen Reactions  . Amoxicillin Hives    Patient tolerated Ceftriaxone in ED on 01/10/19.   Marland Kitchen Clindamycin Diarrhea  . Gemfibrozil Other (See Comments)    muscle ache  . Levofloxacin Other (See Comments)  . Levofloxacin In D5w Other (See Comments)  . Metoprolol Other (See Comments)  . Simvastatin Other (See Comments)    muscle ache  . Sulfa Antibiotics Other (See Comments)  . Nystatin-Triamcinolone Rash      Review of Systems ROS Review of Systems - General ROS: negative for - chills, fatigue, fever, hot flashes, night sweats, weight gain or weight loss Psychological ROS: negative for - anxiety, decreased libido, depression, mood swings, physical abuse or sexual abuse Ophthalmic ROS: negative for - blurry vision, eye pain or loss of vision ENT ROS: negative for - headaches, hearing change, visual changes or vocal changes Allergy and Immunology ROS: negative for - hives, itchy/watery eyes or seasonal allergies Hematological and Lymphatic ROS: negative for - bleeding problems, bruising, swollen lymph nodes or weight loss Endocrine ROS: negative for - galactorrhea, hair pattern changes, hot flashes, malaise/lethargy, mood swings, palpitations, polydipsia/polyuria, skin changes, temperature intolerance or unexpected weight changes Breast ROS: negative for - new or changing breast lumps or nipple discharge Respiratory ROS: negative for - cough or shortness of breath Cardiovascular ROS: negative for - chest pain, irregular heartbeat, palpitations or shortness of  breath Gastrointestinal ROS: no abdominal pain, change in bowel habits, or black or bloody stools Genito-Urinary ROS: no dysuria, trouble voiding, or hematuria.   Musculoskeletal ROS: positive - joint pain or joint stiffness (right leg) Neurological ROS: negative for - bowel and bladder control changes Dermatological ROS: negative for rash and skin lesion changes   Objective:   BP (!) 162/88   Pulse 80   Ht $R'5\' 5"'YF$  (1.651 m)   Wt 181 lb 9.6 oz (82.4 kg)   BMI 30.22 kg/m  CONSTITUTIONAL: Well-developed, well-nourished female in no acute distress.  PSYCHIATRIC: Normal mood and affect. Normal behavior. Normal judgment and thought content. [place]: Alert and oriented to person, place, and time. Normal muscle tone coordination. No cranial nerve deficit noted. HENT:  Normocephalic, atraumatic, External right and left ear normal. Oropharynx is clear and moist EYES: Conjunctivae and EOM are normal. Pupils are equal, round, and reactive to light. No scleral icterus.  NECK: Normal range of motion, supple, no masses.  Normal thyroid.  SKIN: Skin is warm and dry. No rash noted. Not diaphoretic. No erythema. No pallor. CARDIOVASCULAR: Normal heart rate noted, regular rhythm, no murmur. RESPIRATORY: Clear to auscultation bilaterally. Effort and breath sounds normal, no problems with respiration noted. BREASTS: Symmetric in size. No masses, skin changes, nipple drainage, or lymphadenopathy. ABDOMEN: Soft, normal bowel sounds, no distention noted.  No tenderness, rebound or guarding.  BLADDER: Normal PELVIC:  Bladder no bladder distension noted  Urethra: normal appearing urethra with no masses, tenderness or lesions  Vulva: normal appearing vulva with no masses, tenderness or lesions  Vagina: atrophic and Pelvic Floor Exam vaginal prolapse Grade 2-3 with cystocele present  Cervix: surgically absent  Uterus: surgically absent, vaginal cuff well healed  Adnexa: normal adnexa in size, nontender and no  masses   RV: External Exam NormaI, No Rectal Masses and Normal Sphincter  tone  MUSCULOSKELETAL: Normal range of motion. No tenderness.  No cyanosis, clubbing, or edema.  2+ distal pulses. LYMPHATIC: No Axillary, Supraclavicular, or Inguinal Adenopathy.   Labs: Lab Results  Component Value Date   WBC 6.8 08/03/2019   HGB 12.9 08/03/2019   HCT 40.2 08/03/2019   MCV 88.5 08/03/2019   PLT 299 08/03/2019    Lab Results  Component Value Date   CREATININE 1.11 (H) 08/03/2019   BUN 14 08/03/2019   NA 140 08/03/2019   K 4.1 08/03/2019   CL 103 08/03/2019   CO2 27 08/03/2019    Lab Results  Component Value Date   ALT 12 08/03/2019   AST 19 08/03/2019   ALKPHOS 81 08/03/2019   BILITOT 0.7 08/03/2019    Lab Results  Component Value Date   CHOL 244 (H) 08/03/2019   HDL 45 08/03/2019   LDLCALC 156 (H) 08/03/2019   TRIG 213 (H) 08/03/2019   CHOLHDL 5.4 08/03/2019    Lab Results  Component Value Date   TSH 2.355 08/03/2019     Results for orders placed or performed in visit on 04/12/20  POCT urinalysis dipstick  Result Value Ref Range   Color, UA yellow    Clarity, UA clear    Glucose, UA Negative Negative   Bilirubin, UA small 1+    Ketones, UA neg    Spec Grav, UA 1.025 1.010 - 1.025   Blood, UA neg    pH, UA 6.0 5.0 - 8.0   Protein, UA Positive (A) Negative   Urobilinogen, UA 0.2 0.2 or 1.0 E.U./dL   Nitrite, UA pos    Leukocytes, UA Moderate (2+) (A) Negative   Appearance yellow;clear    Odor      Assessment:   1. Visit for gynecologic examination   2. UTI symptoms   3. History of recurrent UTIs   4. Family history of ovarian cancer   5. Family history of breast cancer   6. Age-related osteoporosis without current pathological fracture   7. Vaginal vault prolapse after hysterectomy   8. 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 Visit for gynecologic examination Pap: Not needed. Patient is s/p  hysterectomy and beyond recommended age for screening.  Mammogram: Up to date  Stool Guaiac Testing:  Not Indicated. Patient is up to date on  colonoscopy.  Labs: No labs ordered,  up to date. Anormal lipids noted in Care Everywhere. Patient has seen PCP since this time.  Routine preventative health maintenance measures emphasized: Exercise/Diet/Weight control, Tobacco Warnings, Alcohol/Substance use risks, Stress Management and Safe Sex  COVID vaccination: completed series and has received booster.  Flu vaccine: up to date.    2. UTI symptoms with h/o recurrent UTIs - UA performed today. Small amount or protein, bilirubin. 2+ leukocytes. Culture ordered.   3. Family history of breast and ovarian cancer - Continue yearly surveillance for family history of breast and ovarian cancer. No concerns on today's exam. Mammogram up to date. Normal pelvic exam.   3. Osteopenia of multiple sites - Continue to encourage Vitamin D and calcium intake for Osteoporosis prevention.  Vaginal vault prolapse after hysterectomy  4. 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 Patient notes this is still manageable at this time, mild. Wears urinary pads Declines intervention.   5. Right hip pain - Patient denies any recent trauma or increased  physical activity. Uses NSAIDs occasionally (~ q 3 days). Also advised that she can use Tylenol arthritis. If persistent or worse, advised to f/u with PCP  Hypertension, osteoporosis, and dyslipidemia managed by PCP.   A total of 25 minutes were spent face-to-face with the patient during this encounter and over half of that time involved counseling and coordination of care.   Rubie Maid, MD  [facility]

## 2020-04-12 NOTE — Progress Notes (Signed)
Pt present for well woman exam. Pt c/o lower back pain, pelvic pain and hip pain. UA completed and urine culture collected and sent to lab.

## 2020-04-13 ENCOUNTER — Encounter: Payer: Self-pay | Admitting: Obstetrics and Gynecology

## 2020-04-17 LAB — URINE CULTURE

## 2020-04-17 MED ORDER — CEFIXIME 400 MG PO CAPS: 400.0000 mg | ORAL_CAPSULE | Freq: Every day | ORAL | 0 refills | Status: DC

## 2020-04-17 NOTE — Addendum Note (Signed)
Addended by: Augusto Gamble on: 04/17/2020 01:54 PM   Modules accepted: Orders

## 2020-04-19 ENCOUNTER — Other Ambulatory Visit: Payer: Self-pay | Admitting: Obstetrics and Gynecology

## 2020-04-19 ENCOUNTER — Telehealth: Payer: Self-pay

## 2020-04-19 DIAGNOSIS — I440 Atrioventricular block, first degree: Secondary | ICD-10-CM | POA: Diagnosis not present

## 2020-04-19 DIAGNOSIS — R079 Chest pain, unspecified: Secondary | ICD-10-CM | POA: Diagnosis not present

## 2020-04-19 DIAGNOSIS — K449 Diaphragmatic hernia without obstruction or gangrene: Secondary | ICD-10-CM | POA: Diagnosis not present

## 2020-04-19 DIAGNOSIS — R002 Palpitations: Secondary | ICD-10-CM | POA: Diagnosis not present

## 2020-04-19 DIAGNOSIS — I499 Cardiac arrhythmia, unspecified: Secondary | ICD-10-CM | POA: Diagnosis not present

## 2020-04-19 NOTE — Telephone Encounter (Signed)
I guess we can call in a prescription for macrobid instead, and prescribe it for 10 instead of 7 days.

## 2020-04-19 NOTE — Telephone Encounter (Signed)
Please advise. Thanks Letasha Kershaw 

## 2020-04-19 NOTE — Telephone Encounter (Signed)
Pt called in and stated that she had the wrong medicine filled. The pt is requesting  the cipro laxative sent to the [facility] on [address]. Please advise

## 2020-04-19 NOTE — Telephone Encounter (Signed)
[place] out the [place] and was informed of the information given by the pharmacy. Pt was advised to contact her new pharmacy and ask them to [facility] sent by [facility]. Pt voiced that she understood.

## 2020-04-19 NOTE — Telephone Encounter (Signed)
Patient called in stating that Macrobid was called in however the patient cant take that Rx and that they would need another antibiotic called in for this patient.  Could you please advise?

## 2020-04-19 NOTE — Telephone Encounter (Signed)
Spoke to pt she has moved to another state and no longer live in [place]. Pt was advised [facility] [place] and send rx to her new pharmacy.

## 2020-04-19 NOTE — Telephone Encounter (Signed)
Spoke to pt concerning her call to the office about her not being able to take [place]. Pt stated that the last time she had an UTI Macrobid did not help clear up the infection. AC will send in something else for her to take.

## 2020-04-19 NOTE — Telephone Encounter (Signed)
She has allergies to several other other antibiotics, or the culture is resistant to the others I would give her.  She can come in for an injection of Rocephin.

## 2020-04-20 ENCOUNTER — Other Ambulatory Visit: Payer: Self-pay

## 2020-04-20 DIAGNOSIS — M25552 Pain in left hip: Secondary | ICD-10-CM

## 2020-04-20 MED ORDER — CIPROFLOXACIN HCL 250 MG PO TABS: 250.0000 mg | ORAL_TABLET | Freq: Two times a day (BID) | ORAL | 0 refills | Status: DC

## 2020-04-20 MED ORDER — IBUPROFEN 600 MG PO TABS: 600.0000 mg | ORAL_TABLET | Freq: Two times a day (BID) | ORAL | 0 refills | Status: DC | PRN

## 2020-04-20 NOTE — Telephone Encounter (Signed)
Can prescribe Cipro 250 mg BID x 5 days

## 2020-04-20 NOTE — Addendum Note (Signed)
Addended by: Edwyna Shell on: 04/20/2020 10:52 AM   Modules accepted: Orders

## 2020-04-20 NOTE — Telephone Encounter (Signed)
Please advise. Thanks Shanele Nissan 

## 2020-05-04 ENCOUNTER — Telehealth: Payer: Self-pay

## 2020-05-04 NOTE — Telephone Encounter (Signed)
Left messgae with spouse to call and rs appt for 05-05-20. Amanda Duke

## 2020-05-05 ENCOUNTER — Ambulatory Visit: Payer: Medicare Other | Admitting: Nurse Practitioner

## 2020-05-16 ENCOUNTER — Other Ambulatory Visit: Payer: Self-pay

## 2020-05-18 DIAGNOSIS — I4891 Unspecified atrial fibrillation: Secondary | ICD-10-CM | POA: Diagnosis not present

## 2020-05-18 DIAGNOSIS — R002 Palpitations: Secondary | ICD-10-CM | POA: Diagnosis not present

## 2020-06-01 ENCOUNTER — Encounter: Payer: Medicare Other | Admitting: Obstetrics and Gynecology

## 2020-07-24 DIAGNOSIS — N3090 Cystitis, unspecified without hematuria: Secondary | ICD-10-CM | POA: Diagnosis not present

## 2020-07-24 DIAGNOSIS — Z87440 Personal history of urinary (tract) infections: Secondary | ICD-10-CM | POA: Diagnosis not present

## 2020-07-24 DIAGNOSIS — R300 Dysuria: Secondary | ICD-10-CM | POA: Diagnosis not present

## 2020-07-24 DIAGNOSIS — E785 Hyperlipidemia, unspecified: Secondary | ICD-10-CM | POA: Diagnosis not present

## 2020-07-24 DIAGNOSIS — R319 Hematuria, unspecified: Secondary | ICD-10-CM | POA: Diagnosis not present

## 2020-08-07 DIAGNOSIS — E782 Mixed hyperlipidemia: Secondary | ICD-10-CM | POA: Diagnosis not present

## 2020-08-07 DIAGNOSIS — K5900 Constipation, unspecified: Secondary | ICD-10-CM | POA: Diagnosis not present

## 2020-08-07 DIAGNOSIS — N390 Urinary tract infection, site not specified: Secondary | ICD-10-CM | POA: Diagnosis not present

## 2020-08-07 DIAGNOSIS — I10 Essential (primary) hypertension: Secondary | ICD-10-CM | POA: Diagnosis not present

## 2020-08-07 DIAGNOSIS — K449 Diaphragmatic hernia without obstruction or gangrene: Secondary | ICD-10-CM | POA: Diagnosis not present

## 2020-09-08 ENCOUNTER — Encounter: Payer: Self-pay | Admitting: Physician Assistant

## 2020-09-08 ENCOUNTER — Ambulatory Visit: Payer: Medicare Other | Admitting: Physician Assistant

## 2020-09-08 DIAGNOSIS — Z1152 Encounter for screening for COVID-19: Secondary | ICD-10-CM

## 2020-09-08 DIAGNOSIS — F411 Generalized anxiety disorder: Secondary | ICD-10-CM

## 2020-09-08 LAB — POC SOFIA 2 FLU + SARS ANTIGEN FIA: SARS Coronavirus 2 Ag: POSITIVE — AB

## 2020-09-08 MED ORDER — AZITHROMYCIN 250 MG PO TABS
ORAL_TABLET | ORAL | 0 refills | Status: DC
Start: 2020-09-08 — End: 2020-09-28

## 2020-09-08 MED ORDER — CLONAZEPAM 0.5 MG PO TABS: ORAL_TABLET | ORAL | 0 refills | Status: DC

## 2020-09-08 NOTE — Progress Notes (Signed)
[facility] [place], [place] [address]  Internal MEDICINE  Telephone Visit  Patient Name: Amanda Duke  [id number]  [id number]  Date of Service: 09/08/2020  I connected with the patient at 11:16 by telephone and verified the patients identity using two identifiers.   I discussed the limitations, risks, security and privacy concerns of performing an evaluation and management service by telephone and the availability of in person appointments. I also discussed with the patient that there may be a patient responsible charge related to the service.  The patient expressed understanding and agrees to proceed.    Chief Complaint  Patient presents with  . Acute Visit  . Sinusitis  . Telephone Screen    Phone call, pt is driving home please call at 11:15 AM  . Telephone Assessment    [phone], [phone]    HPI Pt is here for virtual sick visit. She was tested for covid at our office and found to be positive -Wednesday started with sneezing, congestion and thought it was sinusitis since she normally gets it this time of year. Minor cough. Denies SOB, wheezing, headaches, fever or body chills. Still has smell and taste -was around granddaughter Sat, but no one was sick then, but they have since tested positive yesterday. She got the first vaccine and had bad reaction with tachycardia so did not get the second one.  -moved back from [place] recently and needs to set up follow up visit. Requesting refill on klonopin today and discussed that this will be a single refill until she comes in for in person visit to address anxiety.  Current Medication: Outpatient Encounter Medications as of 09/08/2020  Medication Sig  . azithromycin (ZITHROMAX) 250 MG tablet Take one tab a day for 10 days for uri  . carvedilol (COREG) 3.125 MG tablet Take 3.125 mg by mouth 2 (two) times daily with a meal.   . chlorhexidine (PERIDEX) 0.12 % solution Use as directed 15 mLs in the  mouth or throat 2 (two) times daily.  . cholecalciferol (VITAMIN D) 1000 units tablet Take 1,000 Units by mouth daily.  . clonazePAM (KLONOPIN) 0.5 MG tablet TAKE 1 TABLET BY MOUTH AT BEDTIME AS NEEDED INSOMNIA/ANXIETY  . diltiazem (CARDIZEM CD) 120 MG 24 hr capsule Take 120 mg by mouth daily.   . ergocalciferol (DRISDOL) 1.25 MG (50000 UT) capsule Take 1 capsule (50,000 Units total) by mouth once a week.  . fluticasone (FLONASE) 50 MCG/ACT nasal spray Place 2 sprays into the nose as needed.   Marland Kitchen ibuprofen (ADVIL) 600 MG tablet Take 1 tablet (600 mg total) by mouth 2 (two) times daily as needed.  Marland Kitchen icosapent Ethyl (VASCEPA) 1 g capsule Take 1 g by mouth 2 (two) times daily.   Marland Kitchen linaclotide (LINZESS) 290 MCG CAPS capsule Take 1 capsule (290 mcg total) by mouth daily.  Marland Kitchen losartan (COZAAR) 100 MG tablet Take 100 mg by mouth daily.  . nitrofurantoin, macrocrystal-monohydrate, (MACROBID) 100 MG capsule Take 1 capsule (100 mg total) by mouth 2 (two) times daily.  . pantoprazole (PROTONIX) 40 MG tablet Take 40 mg by mouth daily.   . rosuvastatin (CRESTOR) 20 MG tablet Take 1 tablet (20 mg total) by mouth daily. (Patient taking differently: Take 40 mg by mouth daily.)  . tiZANidine (ZANAFLEX) 2 MG tablet Take 1/2 to 1 tablet po QHS prn muscle pain  . [DISCONTINUED] ciprofloxacin (CIPRO) 250 MG tablet Take 1 tablet (250 mg total) by mouth 2 (two) times daily.  . [  DISCONTINUED] clonazePAM (KLONOPIN) 0.5 MG tablet TAKE 1 TABLET BY MOUTH AT BEDTIME AS NEEDED INSOMNIA/ANXIETY   No facility-administered encounter medications on file as of 09/08/2020.    Surgical History: Past Surgical History:  Procedure Laterality Date  . APPENDECTOMY  1971  . ARTHRODESIS  1980  . BACK SURGERY  1980  . COLONOSCOPY WITH PROPOFOL N/A 12/12/2016   Procedure: COLONOSCOPY WITH PROPOFOL;  Surgeon: Lollie Sails, MD;  Location: [facility] ENDOSCOPY;  Service: Endoscopy;  Laterality: N/A;  . COLONOSCOPY WITH PROPOFOL N/A  07/14/2019   Procedure: COLONOSCOPY WITH PROPOFOL;  Surgeon: Toledo, Benay Pike, MD;  Location: [facility] ENDOSCOPY;  Service: Gastroenterology;  Laterality: N/A;  . ESOPHAGOGASTRODUODENOSCOPY (EGD) WITH PROPOFOL N/A 12/12/2016   Procedure: ESOPHAGOGASTRODUODENOSCOPY (EGD) WITH PROPOFOL;  Surgeon: Lollie Sails, MD;  Location: [facility] ENDOSCOPY;  Service: Endoscopy;  Laterality: N/A;  . ESOPHAGOGASTRODUODENOSCOPY (EGD) WITH PROPOFOL N/A 07/14/2019   Procedure: ESOPHAGOGASTRODUODENOSCOPY (EGD) WITH PROPOFOL;  Surgeon: Toledo, Benay Pike, MD;  Location: [facility] ENDOSCOPY;  Service: Gastroenterology;  Laterality: N/A;  . [place]   right  . ORIF ANKLE FRACTURE Right   . TONSILLECTOMY  1948  . TUBAL LIGATION  1971  . VAGINAL HYSTERECTOMY  1985   d/t heavy bleeding    Medical History: Past Medical History:  Diagnosis Date  . Anxiety   . Coronary artery disease   . Gastritis   . GERD (gastroesophageal reflux disease)   . History of colonic polyps   . Hyperlipemia   . Hypertension   . Irregular heart beat   . Pre-diabetes   . Prediabetes   . Redundant colon   . Renal cyst    bilateral    Family History: Family History  Problem Relation Age of Onset  . Heart failure Mother   . Stroke Mother   . Breast cancer Mother        dx at age 72 yo  . Diabetes Mother   . Osteoporosis Mother   . Emphysema Father   . Ovarian cancer Sister        dx at age 65 yo  . Asthma Sister     Social History   Socioeconomic History  . Marital status: Widowed    Spouse name: Not on file  . Number of children: Not on file  . Years of education: Not on file  . Highest education level: Not on file  Occupational History  . Not on file  Tobacco Use  . Smoking status: Former Research scientist (life sciences)  . Smokeless tobacco: Never Used  Vaping Use  . Vaping Use: Never used  Substance and Sexual Activity  . Alcohol use: No    Alcohol/week: 0.0 standard drinks  . Drug use: Never  .  Sexual activity: Not Currently  Other Topics Concern  . Not on file  Social History Narrative  . Not on file   Social Determinants of Health   Financial Resource Strain: Not on file  Food Insecurity: Not on file  Transportation Needs: Not on file  Physical Activity: Not on file  Stress: Not on file  Social Connections: Not on file  Intimate Partner Violence: Not on file      Review of Systems  Constitutional: Positive for fatigue. Negative for chills and fever.  HENT: Positive for congestion, postnasal drip, sinus pressure, sinus pain, sneezing and sore throat. Negative for mouth sores.   Respiratory: Positive for cough. Negative for shortness of breath and wheezing.   Cardiovascular: Negative for chest pain.  Genitourinary: Negative for flank pain.  Musculoskeletal: Negative for myalgias.  Neurological: Negative for headaches.  Psychiatric/Behavioral: Negative.     Vital Signs: Resp 16   Ht 5\' 5"  (1.651 m)   Wt 182 lb (82.6 kg)   BMI 30.29 kg/m    Observation/Objective:  Pt is able to carry out conversation  Assessment/Plan: 1. Encounter for screening for COVID-19 - POC SOFIA 2 FLU + SARS ANTIGEN FIA Postive for covid. Will isolate for 10 days since symptom onset, and educated to rest and stay well hydrated. Drink tea with honey for sore throat. Advised to use Mucinex and Flonase for congestion and postnasal drip and patient will also start on azithromycin.  Patient will go to ED if acutely worsening or develops SOB.  - azithromycin (ZITHROMAX) 250 MG tablet; Take one tab a day for 10 days for uri  Dispense: 10 tablet; Refill: 0  2. Generalized anxiety disorder Will send single refill of Klonopin to be taken as needed, patient educated that she must have a follow-up visit for future refills - clonazePAM (KLONOPIN) 0.5 MG tablet; TAKE 1 TABLET BY MOUTH AT BEDTIME AS NEEDED INSOMNIA/ANXIETY  Dispense: 30 tablet; Refill: 0   General Counseling: [place] verbalizes  understanding of the findings of today's phone visit and agrees with plan of treatment. I have discussed any further diagnostic evaluation that may be needed or ordered today. We also reviewed her medications today. she has been encouraged to call the office with any questions or concerns that should arise related to todays visit.    Orders Placed This Encounter  Procedures  . POC SOFIA 2 FLU + SARS ANTIGEN FIA    Meds ordered this encounter  Medications  . azithromycin (ZITHROMAX) 250 MG tablet    Sig: Take one tab a day for 10 days for uri    Dispense:  10 tablet    Refill:  0  . clonazePAM (KLONOPIN) 0.5 MG tablet    Sig: TAKE 1 TABLET BY MOUTH AT BEDTIME AS NEEDED INSOMNIA/ANXIETY    Dispense:  30 tablet    Refill:  0    Needs appt for future refills    Time spent:30 Minutes    Dr Lavera Guise Internal medicine

## 2020-09-23 ENCOUNTER — Other Ambulatory Visit: Payer: Self-pay | Admitting: Nurse Practitioner

## 2020-09-23 DIAGNOSIS — S76219A Strain of adductor muscle, fascia and tendon of unspecified thigh, initial encounter: Secondary | ICD-10-CM

## 2020-09-26 ENCOUNTER — Ambulatory Visit: Payer: Medicare Other | Admitting: Internal Medicine

## 2020-09-28 ENCOUNTER — Ambulatory Visit (INDEPENDENT_AMBULATORY_CARE_PROVIDER_SITE_OTHER): Payer: Medicare Other | Admitting: Obstetrics and Gynecology

## 2020-09-28 ENCOUNTER — Other Ambulatory Visit: Payer: Self-pay

## 2020-09-28 ENCOUNTER — Encounter: Payer: Self-pay | Admitting: Obstetrics and Gynecology

## 2020-09-28 ENCOUNTER — Other Ambulatory Visit: Payer: Self-pay | Admitting: Obstetrics and Gynecology

## 2020-09-28 VITALS — BP 148/83 | HR 75 | Ht 65.0 in | Wt 180.0 lb

## 2020-09-28 DIAGNOSIS — N816 Rectocele: Secondary | ICD-10-CM | POA: Diagnosis not present

## 2020-09-28 DIAGNOSIS — Z01419 Encounter for gynecological examination (general) (routine) without abnormal findings: Secondary | ICD-10-CM

## 2020-09-28 DIAGNOSIS — N8110 Cystocele, unspecified: Secondary | ICD-10-CM | POA: Diagnosis not present

## 2020-09-28 DIAGNOSIS — R399 Unspecified symptoms and signs involving the genitourinary system: Secondary | ICD-10-CM | POA: Diagnosis not present

## 2020-09-28 DIAGNOSIS — N393 Stress incontinence (female) (male): Secondary | ICD-10-CM

## 2020-09-28 LAB — POCT URINALYSIS DIPSTICK
Bilirubin, UA: NEGATIVE
Glucose, UA: NEGATIVE
Ketones, UA: NEGATIVE
Nitrite, UA: NEGATIVE
Protein, UA: NEGATIVE
Spec Grav, UA: <=1.005 — AB (ref 1.010–1.025)
Urobilinogen, UA: 0.2 E.U./dL
pH, UA: 7 (ref 5.0–8.0)

## 2020-09-28 MED ORDER — FOSFOMYCIN TROMETHAMINE 3 G PO PACK: 3.0000 g | PACK | Freq: Once | ORAL | 1 refills | Status: DC

## 2020-09-28 NOTE — Progress Notes (Signed)
Pt present for annual exam. Pt stated having uti symptoms and would like to discuss surgery or pessary for her prolapse bladder. UA completed and documented.

## 2020-09-30 NOTE — Progress Notes (Signed)
GYNECOLOGY PROGRESS NOTE  Subjective:    Patient ID: Amanda Duke, female    DOB: 10-30-1940, 80 y.o.   MRN: [id number]  HPI  Patient is a 80 y.o. [id number] female who presents for:   1. Amanda Duke complains of urinary frequency and burning.  Symptoms have been ongoing for the past week.  Of note, she has a h/o UTI in March.  She also has a h/o urosepsis ~ 2 years ago.    2. Additionally she complains of urinary incontinence that has worsened over the past 6-12 months.  She has associated nocturia and stress incontinence. Was initially thinking about trying a pessary but notes that she would now desire to think about surgery.    The following portions of the patient's history were reviewed and updated as appropriate: allergies, current medications, past family history, past medical history, past social history, past surgical history and problem list.  Review of Systems Pertinent items noted in HPI and remainder of comprehensive ROS otherwise negative.   Objective:   Blood pressure (!) 148/83, pulse 75, height 5\' 5"  (1.651 m), weight 180 lb (81.6 kg). General appearance: alert and no distress Abdomen: soft, non-tender; bowel sounds normal; no masses,  no organomegaly Pelvic: external genitalia normal, rectovaginal septum normal.  Vagina without discharge, mildly atrophic, cystocele present (Grade 2) and rectocele (Grade 1-2), no leakage of urine on valsalva. Uterus and cervix surgically absent. Adnexae non-palpable, nontender bilaterally.  Extremities: extremities normal, atraumatic, no cyanosis or edema Neurologic: Grossly normal    Labs:  Results for orders placed or performed in visit on 09/28/20  POCT urinalysis dipstick  Result Value Ref Range   Color, UA yellow    Clarity, UA cloudy    Glucose, UA Negative Negative   Bilirubin, UA neg    Ketones, UA neg    Spec Grav, UA <=1.005 (A) 1.010 - 1.025   Blood, UA 1+    pH, UA 7.0 5.0 - 8.0   Protein, UA Negative Negative    Urobilinogen, UA 0.2 0.2 or 1.0 E.U./dL   Nitrite, UA neg    Leukocytes, UA Moderate (2+) (A) Negative   Appearance yellow;cloudy    Odor      Assessment:   1. UTI symptoms   2. Cystocele with rectocele   3. 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 UA with leukocytes and blood, no nitites. This coupled with patient's symptoms suspicious for UTI.  Patient with several drug allergies, and h/o E. Coli infection with drug resistance.  Notes with her last UTI she had to be treated with Monurol. Will order. Advised on wiping with moist wipes.  Also, recurrent UTI's may be secondary to menopause (vaginal atrophy). Discussed use of Premarin cream, given samples. To apply nightly for 14 nights, then decrease to twice weekly.   2. Cystocele with rectocele - patient reports that she would like to consider surgery.  Discussed surgical repair with anterior/posterior repair and pubovaginal sling placement. Reviewed post-operative care and expectations.  Patient thins she would like to thinks about this but will likely consider.  3. Stress incontinence - advised that this could be due to her prolapse. .She desires to discuss surgical management. Discussed that the symptoms may be fixed with the prolapse alone, or may benefit from pessary.    RTC in 2 weeks to reassess UTI symptoms and discuss surgical intervention.    A total of 15 minutes were spent face-to-face with the patient during this encounter  and over half of that time dealt with counseling and coordination of care.    Rubie Maid, MD [facility]

## 2020-10-01 ENCOUNTER — Encounter: Payer: Self-pay | Admitting: Obstetrics and Gynecology

## 2020-10-01 NOTE — Patient Instructions (Signed)
Urinary Tract Infection, Adult A urinary tract infection (UTI) is an infection of any part of the urinary tract. The urinary tract includes:  The kidneys.  The ureters.  The bladder.  The urethra. These organs make, store, and get rid of pee (urine) in the body. What are the causes? This infection is caused by germs (bacteria) in your genital area. These germs grow and cause swelling (inflammation) of your urinary tract. What increases the risk? The following factors may make you more likely to develop this condition:  Using a small, thin tube (catheter) to drain pee.  Not being able to control when you pee or poop (incontinence).  Being female. If you are female, these things can increase the risk: ? Using these methods to prevent pregnancy:  A medicine that kills sperm (spermicide).  A device that blocks sperm (diaphragm). ? Having low levels of a female hormone (estrogen). ? Being pregnant. You are more likely to develop this condition if:  You have genes that add to your risk.  You are sexually active.  You take antibiotic medicines.  You have trouble peeing because of: ? A prostate that is bigger than normal, if you are female. ? A blockage in the part of your body that drains pee from the bladder. ? A kidney stone. ? A nerve condition that affects your bladder. ? Not getting enough to drink. ? Not peeing often enough.  You have other conditions, such as: ? Diabetes. ? A weak disease-fighting system (immune system). ? Sickle cell disease. ? Gout. ? Injury of the spine. What are the signs or symptoms? Symptoms of this condition include:  Needing to pee right away.  Peeing small amounts often.  Pain or burning when peeing.  Blood in the pee.  Pee that smells bad or not like normal.  Trouble peeing.  Pee that is cloudy.  Fluid coming from the vagina, if you are female.  Pain in the belly or lower back. Other symptoms include:  Vomiting.  Not  feeling hungry.  Feeling mixed up (confused). This may be the first symptom in older adults.  Being tired and grouchy (irritable).  A fever.  Watery poop (diarrhea). How is this treated?  Taking antibiotic medicine.  Taking other medicines.  Drinking enough water. In some cases, you may need to see a specialist. Follow these instructions at home: Medicines  Take over-the-counter and prescription medicines only as told by your doctor.  If you were prescribed an antibiotic medicine, take it as told by your doctor. Do not stop taking it even if you start to feel better. General instructions  Make sure you: ? Pee until your bladder is empty. ? Do not hold pee for a long time. ? Empty your bladder after sex. ? Wipe from front to back after peeing or pooping if you are a female. Use each tissue one time when you wipe.  Drink enough fluid to keep your pee pale yellow.  Keep all follow-up visits.   Contact a doctor if:  You do not get better after 1-2 days.  Your symptoms go away and then come back. Get help right away if:  You have very bad back pain.  You have very bad pain in your lower belly.  You have a fever.  You have chills.  You feeling like you will vomit or you vomit. Summary  A urinary tract infection (UTI) is an infection of any part of the urinary tract.  This condition is caused by   germs in your genital area.  There are many risk factors for a UTI.  Treatment includes antibiotic medicines.  Drink enough fluid to keep your pee pale yellow. This information is not intended to replace advice given to you by your health care provider. Make sure you discuss any questions you have with your health care provider. Document Revised: 11/26/2019 Document Reviewed: 11/26/2019 Elsevier Patient Education  [place].    ???? ????? ???????? ? ?????? ??????????? ? ????????? ??????????? ?????? Anterior and Posterior Colporrhaphy and Sling Procedure,  Care After ????????? ?????????? ???????? ???????????? ?? ????? ?? ????? ????? ?????????. ??? ??????? ???? ????? ????? ???????????? ??? ????? ????????? ???????????. ?????????? ? ?????? ???????? ?????, ???? ? ??? ???????? ?????-???? ?????????? ??? ???????. ??? ????? ??????? ????? ?????? ?????????? ????? ???? ????????? ???????? ?????????? ????????:  ???? ? ??????? ?????????????? ?????????????.  ????? ?? ????? ? ????????? ?? ?????????. ? ??????? ????? ??????? ??? ??????????? ???????????? ????????????? ?????????.  ????????? (?????????? ????????????). ? ???????? ???????? ???????? ?????? ?????????: ????????????? ?????????  ?????????? ?????????????? ? ??????????? ????????????? ????????? ?????? ??? ??????? ????? ??????? ??????.  ???? ??? ????????? ??????????, ?????????? ??? ??? ??????? ????? ??????? ??????. ?? ??????????? ????????? ??????????, ???? ???? ?? ??????? ??????????? ???? ?????.  ???????? ? ?????? ???????? ?????, ????? ?? ?? ????? ?????? ???????????? ??? ?????????: ? ???????? ???????? ?????????? ??? ?????? ? ????????. ? ????? ?? ?? ??????? ?????. ????????, ??? ??????????? ??????? ??? ???? ??? ???????????? ??? ??????? ??????:  ????? ?????????? ????????, ????? ???? ???? ?????????? ??????-??????? ?????.  ?????????? ????????????? ????????, ??????????? ??? ??????? ??? ??????????? ?????????.  ???????????? ???????? ? ??????? ??????????? ?????????, ????????, ???????, ?????????????? ????????, ?????? ?????? ? ?????.  ?????????? ??????????? ????????? ? ??????? ??????????? ????? ? ???????????? ???????, ????????, ??????? ??? ??????? ?????????. ???? ?? ????????  ?????????? ???? ??????? ??????? ?????? ???? ?? ??????? ????????? ????????. ?????????? ??: ? ?????????? ?????????? ???????? ??? ?????, ???????????? ?? ?????????. ? ????????? ???? ??? ????????? ? ?????????? ??????? ?? ?????????. ? ???????? ???? ??? ??????????? ? ??????? ?????????.  ?? ?????????? ?????, ?? ???????? ? ?? ????????? ?  ?????????????? ?????, ???? ??? ??????? ???? ?? ????????. ?? ?????? ????????? ???.  ????????? ??????? ????? ?????????? ? ?????? ?????? (???????????) ? ??????? ? ???????. ????? ??????? ??????????? ????????? ? ?????????????? ??????????? ????????????.  ???????? ? ?????? ???????? ?????, ????? ?? ????????? ??????? ????? ??? ?????? ? ????? ? ?????? ?????? ?????. ????????????  ?????????, ??? ??????? ????? ??????? ??????.  ????????? ???????????????? ??????? ??? ????????. ?????? 1-2 ???? ????????????, ????? ????????. ??? ????? ??? ????????? ????????? ? ???????. ???? ?? ?????????? ????????, ??? ??????? ??????????, ????????? ? ??????.  ?????????? ?? ??????? ???????? ? ????????????, ????????? ??????? ?????? (??????????).  He ?????????? ???????? ??????? 5?????? (2,3??) ??? ??????? ????????????? ????????????? ????, ?? ??? ???, ???? ??? ??????? ???? ?? ??????, ??? ??? ?????????.  ?? ??????? ????????? ? ??????? ????????.  ????????????? ? ????? ???????????? ????? ??? ??????? ????? ??????? ??????. ???????? ? ?????? ???????? ?????, ????? ???? ???????? ??????????? ??? ???.   ????? ????????  ??? ????? ???? ???????? ?? ?????????? ?????????? ??? ???????? ??? (?????????? ??????). ?????????? ?? ? ???????????? ? ?????????? ?????? ???????? ?????.  ?????? ?????????????? ????? ??? ??????? ????? ??????? ??????. ??? ????? ????????????? ??????????? ???????? ????? ? ???????????? ?????????? ????? ???.  ???? ??? ?????? ??????? ???????, ???????? ??????????? ?????? ???????? ????? ?? ??????????? ????? ????????.  ?? ??????? ????????????, ?? ??????????? ????????? ? ?? ??????????? ??????, ???? ??? ??????? ???? ?? ????????.  ????????? ?? ??? ?????? ???????????? ??????????. ??? ?????. ?????????? ? ???????? ?????, ????:  ???? ?? ???????? ??? ?????? ????????????? ??????????.  ? ??? ???????? ????? ??????????? ? ??????? ?????????.  ? ??? ?????? ?????????????? ??? ??????? ??????, ??? ?? ?? ?????? ????????? ?????????? ???????  ??????.  ??? ?????????????? ?????????? ???????? ??????.  ?? ??????? ????????? ?????????? ??????? ?????????? ???????? ??? ?????.  ?? ??????? ????????? ?????????? ???? ??? ????????? ? ?????????? ???????.  ? ??? ????????? ???? ??? ??????????? ? ??????? ?????????. ?????????? ?????????? ?? ???????, ????:  ? ??? ????????? ??? ?????.  ? ??? ????????? ???????? ??????????? ????????????.  ?? ?? ?????? ????????, ??? ?? ?????? ???????? ?????????? ???????? ????.  ? ??? ????????? ???? ? ?????, ?????? ??? ????.  ??? ?????? ??????. ??? ???????? ????? ????????? ?? ????????? ?????????, ????????? ???????? ?????????? ??????. ?? ?????, ???? ???????? ??????? ????. ?????????? ?????????? ?? ??????????? ???????. ????????? ? ??????? ?????? ?????????? ?????? (? ??? ?? ??????  911). ?? ???????? ?? ????, ????? ?????????????? ????????? ?? ????????. ??????  ????? ????????? ????? ????????? ????, ?????????? ????????????, ????? ? ????????? ?? ?????????.  ????????? ??????? ????? ?????????? ? ?????? ?????? (???????????) ? ??????? ? ???????. ????? ??????? ??????????? ????????? ? ?????????????? ??????????? ????????????.  ?????????? ??????????? ?????? ???????? ????? ? ????????? ????? ??????????? ???????????? ????? ?????????. ??? ?????????? ?? ????? ???????? ??????, ??????????????? ????? ??????. ??????????? ???????? ????? ???????????? ??? ??????? ? ????? ??????? ??????. Document Revised: 10/29/2019 Document Reviewed: 10/29/2019 Elsevier Patient Education  2021 [employer].

## 2020-10-04 ENCOUNTER — Telehealth: Payer: Self-pay | Admitting: Internal Medicine

## 2020-10-04 NOTE — Chronic Care Management (AMB) (Signed)
  Chronic Care Management   Note  10/04/2020 Name: Amanda Duke MRN: [id number] DOB: 10/28/1940  Amanda Duke is a 80 y.o. year old female who is a primary care patient of Lavera Guise, MD. I reached out to Sheran Spine by phone today in response to a referral sent by Amanda Duke's PCP, Lavera Guise, MD.   Ms. Farnam was given information about Chronic Care Management services today including:  1. CCM service includes personalized support from designated clinical staff supervised by her physician, including individualized plan of care and coordination with other care providers 2. 24/7 contact phone numbers for assistance for urgent and routine care needs. 3. Service will only be billed when office clinical staff spend 20 minutes or more in a month to coordinate care. 4. Only one practitioner may furnish and bill the service in a calendar month. 5. The patient may stop CCM services at any time (effective at the end of the month) by phone call to the office staff.   Patient agreed to services and verbal consent obtained.   Follow up plan:   Tatjana Secretary/administrator

## 2020-10-06 ENCOUNTER — Other Ambulatory Visit: Payer: Self-pay

## 2020-10-06 ENCOUNTER — Ambulatory Visit (INDEPENDENT_AMBULATORY_CARE_PROVIDER_SITE_OTHER): Payer: Medicare Other | Admitting: Physician Assistant

## 2020-10-06 ENCOUNTER — Encounter: Payer: Self-pay | Admitting: Physician Assistant

## 2020-10-06 VITALS — BP 132/84 | HR 78 | Temp 97.5°F | Resp 16 | Ht 64.5 in | Wt 180.0 lb

## 2020-10-06 DIAGNOSIS — F411 Generalized anxiety disorder: Secondary | ICD-10-CM | POA: Diagnosis not present

## 2020-10-06 DIAGNOSIS — M150 Primary generalized (osteo)arthritis: Secondary | ICD-10-CM | POA: Diagnosis not present

## 2020-10-06 DIAGNOSIS — I10 Essential (primary) hypertension: Secondary | ICD-10-CM

## 2020-10-06 DIAGNOSIS — I700 Atherosclerosis of aorta: Secondary | ICD-10-CM

## 2020-10-06 DIAGNOSIS — M25551 Pain in right hip: Secondary | ICD-10-CM

## 2020-10-06 DIAGNOSIS — M8589 Other specified disorders of bone density and structure, multiple sites: Secondary | ICD-10-CM

## 2020-10-06 DIAGNOSIS — R300 Dysuria: Secondary | ICD-10-CM | POA: Diagnosis not present

## 2020-10-06 DIAGNOSIS — M25552 Pain in left hip: Secondary | ICD-10-CM

## 2020-10-06 DIAGNOSIS — E782 Mixed hyperlipidemia: Secondary | ICD-10-CM

## 2020-10-06 DIAGNOSIS — G8929 Other chronic pain: Secondary | ICD-10-CM

## 2020-10-06 DIAGNOSIS — I471 Supraventricular tachycardia: Secondary | ICD-10-CM

## 2020-10-06 DIAGNOSIS — Z0189 Encounter for other specified special examinations: Secondary | ICD-10-CM

## 2020-10-06 DIAGNOSIS — Z23 Encounter for immunization: Secondary | ICD-10-CM

## 2020-10-06 LAB — POCT URINALYSIS DIPSTICK
Bilirubin, UA: NEGATIVE
Blood, UA: NEGATIVE
Glucose, UA: NEGATIVE
Leukocytes, UA: NEGATIVE
Nitrite, UA: NEGATIVE
Protein, UA: POSITIVE — AB
Spec Grav, UA: <=1.005 — AB (ref 1.010–1.025)
Urobilinogen, UA: 0.2 E.U./dL
pH, UA: 7.5 (ref 5.0–8.0)

## 2020-10-06 MED ORDER — CLONAZEPAM 0.5 MG PO TABS: ORAL_TABLET | ORAL | 1 refills | Status: DC

## 2020-10-06 MED ORDER — ZOSTER VAC RECOMB ADJUVANTED 50 MCG/0.5ML IM SUSR: 0.5000 mL | Freq: Once | INTRAMUSCULAR | 1 refills | Status: AC

## 2020-10-06 MED ORDER — DICLOFENAC SODIUM 1 % EX GEL: 2.0000 g | Freq: Four times a day (QID) | CUTANEOUS | 2 refills | Status: DC

## 2020-10-06 NOTE — Progress Notes (Signed)
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 -Took fosfomycin dose last week from OBGYN for UTI, wants to recheck urine today. Does have another dose if needed. -[place] health nurse came out yesterday but forgot to bring paper today. -Hip pain has been bothering her more. Had scans done a few years ago showing OA in right hip. Did go to ortho 1 time and was given meloxicam but didn't help much. Taking tylenol now, but still painful, especially with walking. Will try some topical Voltaren gel as well--sent script but may be cheaper OTC. Really does not want pain medications, but would be interested in ortho evaluation to see if surgery vs PT would be indicated. -has bad GERD, so never started on fosamax for more than a week. BMD done in Oct 2020 showed osteopenia, can repeat this October if desired. Pt will start calcium and vit D supplements, but if osteoporosis may need to consider prolia infusions due to GERD intolerance to oral bisphosphonates. -Interested in shingles vaccine -takes klonopin only as needed -Due for routine fasting labs and CPE at next visit, Will have labs done prior to visit.  Current Medication: Outpatient Encounter Medications as of 10/06/2020  Medication Sig   diclofenac Sodium (VOLTAREN) 1 % GEL Apply 2 g topically 4 (four) times daily.   [DISCONTINUED] Zoster Vaccine Adjuvanted [facility]) injection Inject 0.5 mLs into the muscle once.   carvedilol (COREG) 3.125 MG tablet Take 3.125 mg by mouth 2  (two) times daily with a meal.    chlorhexidine (PERIDEX) 0.12 % solution Use as directed 15 mLs in the mouth or throat 2 (two) times daily.   cholecalciferol (VITAMIN D) 1000 units tablet Take 1,000 Units by mouth daily.   [START ON 10/09/2020] clonazePAM (KLONOPIN) 0.5 MG tablet TAKE 1 TABLET BY MOUTH AT BEDTIME AS NEEDED INSOMNIA/ANXIETY   diltiazem (CARDIZEM CD) 120 MG 24 hr capsule Take 120 mg by mouth daily.    ergocalciferol (DRISDOL) 1.25 MG (50000 UT) capsule Take 1 capsule (50,000 Units total) by mouth once a week.   ibuprofen (ADVIL) 600 MG tablet Take 1 tablet (600 mg total) by mouth 2 (two) times daily as needed.   icosapent Ethyl (VASCEPA) 1 g capsule Take 1 g by mouth 2 (two) times daily.    linaclotide (LINZESS) 290 MCG CAPS capsule Take 1 capsule (290 mcg total) by mouth daily.   losartan (COZAAR) 100 MG tablet Take 100 mg by mouth daily.   pantoprazole (PROTONIX) 40 MG tablet Take 40 mg by mouth daily.    rosuvastatin (CRESTOR) 20 MG tablet Take 1 tablet (20 mg total) by mouth daily. (Patient taking differently: Take 40 mg by mouth daily.)   Zoster Vaccine Adjuvanted [facility]) injection Inject 0.5 mLs into the muscle once for 1 dose.   [DISCONTINUED] clonazePAM (KLONOPIN) 0.5 MG tablet TAKE 1 TABLET BY MOUTH AT BEDTIME AS NEEDED INSOMNIA/ANXIETY   No facility-administered encounter medications on file as of 10/06/2020.    Surgical History:  Past Surgical History:  Procedure Laterality Date   [place]   COLONOSCOPY WITH PROPOFOL N/A 12/12/2016   Procedure: COLONOSCOPY WITH PROPOFOL;  Surgeon: Lollie Sails, MD;  Location: [facility] ENDOSCOPY;  Service: Endoscopy;  Laterality: N/A;   COLONOSCOPY WITH PROPOFOL N/A 07/14/2019   Procedure: COLONOSCOPY WITH PROPOFOL;  Surgeon: Toledo, Benay Pike, MD;  Location: [facility] ENDOSCOPY;  Service: Gastroenterology;  Laterality: N/A;   ESOPHAGOGASTRODUODENOSCOPY (EGD) WITH PROPOFOL N/A  12/12/2016   Procedure: ESOPHAGOGASTRODUODENOSCOPY (EGD) WITH PROPOFOL;  Surgeon: Lollie Sails, MD;  Location: [facility] ENDOSCOPY;  Service: Endoscopy;  Laterality: N/A;   ESOPHAGOGASTRODUODENOSCOPY (EGD) WITH PROPOFOL N/A 07/14/2019   Procedure: ESOPHAGOGASTRODUODENOSCOPY (EGD) WITH PROPOFOL;  Surgeon: Toledo, Benay Pike, MD;  Location: [facility] ENDOSCOPY;  Service: Gastroenterology;  Laterality: N/A;   LAPAROSCOPIC UNILATERAL SALPINGO OOPHERECTOMY  1985   right   ORIF ANKLE FRACTURE Right    TONSILLECTOMY  1948   TUBAL LIGATION  1971   VAGINAL HYSTERECTOMY  1985   d/t heavy bleeding    Medical History: Past Medical History:  Diagnosis Date   Anxiety    Coronary artery disease    Gastritis    GERD (gastroesophageal reflux disease)    History of colonic polyps    Hyperlipemia    Hypertension    Irregular heart beat    Pre-diabetes    Prediabetes    Redundant colon    Renal cyst    bilateral    Family History: Family History  Problem Relation Age of Onset   Heart failure Mother    Stroke Mother    Breast cancer Mother        dx at age 53 yo   Diabetes Mother    Osteoporosis Mother    Emphysema Father    Ovarian cancer Sister        dx at age 45 yo   Asthma Sister    Diabetes Daughter    Heart disease Daughter    COPD Daughter     Social History   Socioeconomic History   Marital status: Widowed    Spouse name: Not on file   Number of children: Not on file   Years of education: Not on file   Highest education level: Not on file  Occupational History   Not on file  Tobacco Use   Smoking status: Former    Pack years: 0.00   Smokeless tobacco: Never  Vaping Use   Vaping Use: Never used  Substance and Sexual Activity   Alcohol use: No    Alcohol/week: 0.0 standard drinks   Drug use: Never   Sexual activity: Not Currently  Other Topics Concern   Not on file  Social History Narrative   Not on file   Social Determinants of Health   Financial Resource  Strain: Not on file  Food Insecurity: Not on file  Transportation Needs: Not on file  Physical Activity: Not on file  Stress: Not on file  Social Connections: Not on file  Intimate Partner Violence: Not on file      Review of Systems  Constitutional:  Negative for chills, fatigue and unexpected weight change.  HENT:  Negative for congestion, postnasal drip, rhinorrhea, sneezing and sore throat.   Eyes:  Negative for redness.  Respiratory:  Negative for cough, chest tightness and shortness of breath.   Cardiovascular:  Negative for chest pain and palpitations.  Gastrointestinal:  Negative for abdominal pain, constipation,  diarrhea, nausea and vomiting.  Genitourinary:  Negative for difficulty urinating, dysuria, frequency, pelvic pain and urgency.       Incontinence of bladder--chronic, looking into bladder surgery w/ OBGYN  Musculoskeletal:  Positive for arthralgias and back pain. Negative for joint swelling and neck pain.       Chronic hip pain worsening  Skin:  Negative for rash.  Neurological: Negative.  Negative for tremors and numbness.  Hematological:  Negative for adenopathy. Does not bruise/bleed easily.  Psychiatric/Behavioral:  Negative for behavioral problems (Depression), sleep disturbance and suicidal ideas. The patient is not nervous/anxious.    Vital Signs: BP 132/84   Pulse 78   Temp (!) 97.5 F (36.4 C)   Resp 16   Ht 5' 4.5" (1.638 m)   Wt 180 lb (81.6 kg)   SpO2 98%   BMI 30.42 kg/m    Physical Exam Vitals and nursing note reviewed.  Constitutional:      General: She is not in acute distress.    Appearance: She is well-developed. She is obese. She is not diaphoretic.  HENT:     Head: Normocephalic and atraumatic.     Mouth/Throat:     Pharynx: No oropharyngeal exudate.  Eyes:     Pupils: Pupils are equal, round, and reactive to light.  Neck:     Thyroid: No thyromegaly.     Vascular: No JVD.     Trachea: No tracheal deviation.  Cardiovascular:      Rate and Rhythm: Normal rate and regular rhythm.     Heart sounds: Normal heart sounds. No murmur heard.   No friction rub. No gallop.  Pulmonary:     Effort: Pulmonary effort is normal. No respiratory distress.     Breath sounds: No wheezing or rales.  Chest:     Chest wall: No tenderness.  Abdominal:     General: Bowel sounds are normal.     Palpations: Abdomen is soft.  Musculoskeletal:        General: Normal range of motion.     Cervical back: Normal range of motion and neck supple.     Comments: Increased hip pain with walking  Lymphadenopathy:     Cervical: No cervical adenopathy.  Skin:    General: Skin is warm and dry.  Neurological:     Mental Status: She is alert and oriented to person, place, and time.     Cranial Nerves: No cranial nerve deficit.  Psychiatric:        Behavior: Behavior normal.        Thought Content: Thought content normal.        Judgment: Judgment normal.       Assessment/Plan: 1. Chronic hip pain, bilateral May continue to take Tylenol as needed may also try topical Voltaren gel to see if this helps relieving any pain.  Meloxicam did not help patient and she was worried about GI concerns of long-term use.  Interested in referral to orthopedics for reevaluation to consider possible surgery versus PT indications.  Right hip is worse than left. - diclofenac Sodium (VOLTAREN) 1 % GEL; Apply 2 g topically 4 (four) times daily.  Dispense: 100 g; Refill: 2 - AMB referral to orthopedics  2. Primary generalized (osteo)arthritis May continue to take Tylenol as needed may also try topical Voltaren gel to see if this helps relieving any pain.  Meloxicam did not help patient and she was worried about GI concerns of long-term use.  Interested in referral to orthopedics for  reevaluation to consider possible surgery versus PT indications. - diclofenac Sodium (VOLTAREN) 1 % GEL; Apply 2 g topically 4 (four) times daily.  Dispense: 100 g; Refill: 2 - AMB  referral to orthopedics  3. Osteopenia of multiple sites Supplement calcium and vitamin D.  May consider repeating a bone density scan in October at which time may need to consider Prolia infusions due to intolerance to oral bisphosphonates.  4. Generalized anxiety disorder May continue Klonopin as needed - clonazePAM (KLONOPIN) 0.5 MG tablet; TAKE 1 TABLET BY MOUTH AT BEDTIME AS NEEDED INSOMNIA/ANXIETY  Dispense: 30 tablet; Refill: 1  5. Paroxysmal supraventricular tachycardia ([place]) Followed by cardiology, continues on diltiazem  6. Benign essential HTN Stable, continue current therapy  7. Atherosclerosis of aorta (HCC) Continue Crestor, vascepa, and 81 mg aspirin  8. Mixed hyperlipidemia Continue Crestor, Vascepa, and 81 mg aspirin  9. Encounter for herpes zoster vaccination - Zoster Vaccine Adjuvanted [facility]) injection; Inject 0.5 mLs into the muscle once for 1 dose.  Dispense: 0.5 mL; Refill: 1  10. Dysuria - POCT Urinalysis Dipstick  11. Encounter for lab test Lab slip given to have routine fasting labs done at the hospital prior to next visit  General Counseling: Leonora verbalizes understanding of the findings of todays visit and agrees with plan of treatment. I have discussed any further diagnostic evaluation that may be needed or ordered today. We also reviewed her medications today. she has been encouraged to call the office with any questions or concerns that should arise related to todays visit.    Orders Placed This Encounter  Procedures   AMB referral to orthopedics   POCT Urinalysis Dipstick    Meds ordered this encounter  Medications   diclofenac Sodium (VOLTAREN) 1 % GEL    Sig: Apply 2 g topically 4 (four) times daily.    Dispense:  100 g    Refill:  2   Zoster Vaccine Adjuvanted [facility]) injection    Sig: Inject 0.5 mLs into the muscle once for 1 dose.    Dispense:  0.5 mL    Refill:  1   clonazePAM (KLONOPIN) 0.5 MG tablet    Sig: TAKE 1  TABLET BY MOUTH AT BEDTIME AS NEEDED INSOMNIA/ANXIETY    Dispense:  30 tablet    Refill:  1    This patient was seen by Drema Dallas, PA-C in collaboration with Dr. Clayborn Bigness as a part of collaborative care agreement.   Total time spent:40 Minutes Time spent includes review of chart, medications, test results, and follow up plan with the patient.      Dr Lavera Guise Internal medicine

## 2020-10-12 NOTE — Progress Notes (Signed)
Pt present to discuss surgery. Pt stated that she is unable to control/hold her bladder.

## 2020-10-13 ENCOUNTER — Ambulatory Visit (INDEPENDENT_AMBULATORY_CARE_PROVIDER_SITE_OTHER): Payer: Medicare Other | Admitting: Obstetrics and Gynecology

## 2020-10-13 ENCOUNTER — Other Ambulatory Visit: Payer: Self-pay

## 2020-10-13 ENCOUNTER — Encounter: Payer: Self-pay | Admitting: Obstetrics and Gynecology

## 2020-10-13 VITALS — BP 157/89 | HR 68 | Ht 64.5 in | Wt 181.9 lb

## 2020-10-13 DIAGNOSIS — Z90710 Acquired absence of both cervix and uterus: Secondary | ICD-10-CM | POA: Diagnosis not present

## 2020-10-13 DIAGNOSIS — N8110 Cystocele, unspecified: Secondary | ICD-10-CM

## 2020-10-13 DIAGNOSIS — N393 Stress incontinence (female) (male): Secondary | ICD-10-CM

## 2020-10-13 DIAGNOSIS — N816 Rectocele: Secondary | ICD-10-CM | POA: Diagnosis not present

## 2020-10-13 NOTE — Progress Notes (Signed)
    GYNECOLOGY PROGRESS NOTE  Subjective:    Patient ID: Amanda Duke, female    DOB: 1940-05-17, 80 y.o.   MRN: [id number]  HPI  Patient is a 80 y.o. [id number] female who presents for follow up of urinary incontinence (mainly stress), cystocele with rectocele, and recent UTI. Notes after taking antibiotic her UTI symptoms have resolved.  Desires to discuss management options for her stress incontinence.  Notes having MMK procedure performed with her hysterectomy in 1980s.   The following portions of the patient's history were reviewed and updated as appropriate: allergies, current medications, past family history, past medical history, past social history, past surgical history, and problem list.  Review of Systems Pertinent items noted in HPI and remainder of comprehensive ROS otherwise negative.   Objective:   Blood pressure (!) 157/89, pulse 68, height 5' 4.5" (1.638 m), weight 181 lb 14.4 oz (82.5 kg). General appearance: alert and no distress Remainder of exam deferred today.    Assessment:   1. Cystocele with rectocele   2. 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  Patient notes she had been leaning towards surgery.    Patient desires surgical management with anterior (possible posterior) repair and urethral sling (TOT).  The risks of surgery were discussed in detail with the patient including but not limited to: bleeding which may require transfusion or reoperation; infection which may require prolonged hospitalization or re-hospitalization and antibiotic therapy; injury to bowel, bladder, ureters and major vessels or other surrounding organs; formation of adhesions; need for additional procedures including laparotomy or subsequent procedures secondary to abnormal pathology; thromboembolic phenomenon; incisional problems and other postoperative or anesthesia complications.  Patient was told  that the likelihood that her condition and symptoms will be treated effectively with this surgical management was very high; the postoperative expectations were also discussed in detail. The patient also understands the alternative treatment options which were discussed in full. All questions were answered.  She was told that she will be contacted by our surgical scheduler regarding the time and date of her surgery; routine preoperative instructions will be given to her by the preoperative nursing team.   She is aware of need for preoperative COVID testing and subsequent quarantine from time of test to time of surgery; she will be given further preoperative instructions at that [place] screening visit.  Printed patient education handouts about the procedure were given to the patient to review at home.  Surgery tentatively scheduled for July 11th or 18th (first available).    A total of 20 minutes were spent face-to-face with the patient during this encounter and over half of that time dealt with counseling and coordination of care.  Rubie Maid, MD [facility]

## 2020-10-13 NOTE — Patient Instructions (Signed)
    COVID 19 Instructions for Scheduled Procedure (Inductions/C-sections and GYN surgeries)   Thank you for choosing [facility] for your services.  You have been scheduled for a procedure called __ANTERIOR/POSTERIOR REPAIR WITH URETHRAL SLING_____.    Your procedure is scheduled on _____JULY 11, 2022 (TENTATIVELY)_______.  You are required to have COVID-19 testing performed 2-3 days prior to your scheduled procedure date.  Testing is performed between 9 AM and 2 PM Monday through Friday.  Please present for testing on _____JULY 8, 2022__________ during this hour. Testing is performed in the [place] (this is next to the [facility]).    Upon your scheduled procedure date, you will need to arrive at the [place] entrance. (There is a statue at the front of this entrance.)   Please arrive on time if you are scheduled for an induction of labor.   If you are scheduled for a Cesarean delivery or for Gyn Surgery, arrive 2 hours prior to your procedure time.   If you are an Obstetric patient and your arrival time falls between 11 PM and 6 AM call L&D [phone]) when you arrive.  A staff member will meet you at the [place] entrance.  At this time, patients are allowed 1 support person to accompany them. Face masks are required for you and your support person. Your support person is now allowed to be there with you during the entire time of your admission.   Please contact the office if you have any questions regarding this information.  The [facility] office number is [phone].     Thank you,    Your [facility] Providers

## 2020-10-24 DIAGNOSIS — M1612 Unilateral primary osteoarthritis, left hip: Secondary | ICD-10-CM | POA: Diagnosis not present

## 2020-10-24 DIAGNOSIS — M7061 Trochanteric bursitis, right hip: Secondary | ICD-10-CM | POA: Diagnosis not present

## 2020-10-27 ENCOUNTER — Telehealth: Payer: Self-pay

## 2020-10-27 NOTE — Telephone Encounter (Signed)
Referral faxed to [facility] at [phone]. Amanda Duke

## 2020-11-01 ENCOUNTER — Other Ambulatory Visit: Payer: Self-pay

## 2020-11-01 ENCOUNTER — Encounter
Admission: RE | Admit: 2020-11-01 | Discharge: 2020-11-01 | Disposition: A | Discharge status: Home / Self Care | Payer: Medicare Other | Source: Ambulatory Visit | Attending: Obstetrics and Gynecology | Admitting: Obstetrics and Gynecology

## 2020-11-01 HISTORY — DX: Pneumonia, unspecified organism: J18.9

## 2020-11-01 NOTE — Patient Instructions (Signed)
Your procedure is scheduled on: November 06, 2020 Monday  Report to the Registration Desk on the 1st floor of the [facility]. To find out your arrival time, please call [phone] between 1PM - 3PM on: Friday November 03, 2020  REMEMBER: Instructions that are not followed completely may result in serious medical risk, up to and including death; or upon the discretion of your surgeon and anesthesiologist your surgery may need to be rescheduled.  DO NOT EAT OR DRINK after midnight the night before surgery.  No gum chewing, lozengers or hard candies.  TAKE THESE MEDICATIONS THE MORNING OF SURGERY WITH A SIP OF WATER: DILTIAZEM CARVEDILOL PANTOPRAZOLE (take one the night before and one on the morning of surgery - helps to prevent nausea after surgery.)  STOP ASPIRIN, OR VESCEPA.   One week prior to surgery: Stop Anti-inflammatories (NSAIDS) such as Advil, Aleve, Ibuprofen, Motrin, Naproxen, Naprosyn and Aspirin based products such as Excedrin, Goodys Powder, BC Powder AND VOLTAREN. Stop ANY OVER THE COUNTER supplements until after surgery. You may however, continue to take Tylenol if needed for pain up until the day of surgery.  No Alcohol for 24 hours before or after surgery.  No Smoking including e-cigarettes for 24 hours prior to surgery.  No chewable tobacco products for at least 6 hours prior to surgery.  No nicotine patches on the day of surgery.  Do not use any "recreational" drugs for at least a week prior to your surgery.  Please be advised that the combination of cocaine and anesthesia may have negative outcomes, up to and including death. If you test positive for cocaine, your surgery will be cancelled.  On the morning of surgery brush your teeth with toothpaste and water, you may rinse your mouth with mouthwash if you wish. Do not swallow any toothpaste or mouthwash.  Do not wear jewelry, make-up, hairpins, clips or nail polish.  Do not wear lotions, powders, or perfumes OR  DEODORANT   Do not shave body from the neck down 48 hours prior to surgery just in case you cut yourself which could leave a site for infection.  Also, freshly shaved skin may become irritated if using the CHG soap.  Contact lenses, hearing aids and dentures may not be worn into surgery.  Do not bring valuables to the hospital. [facility] is not responsible for any missing/lost belongings or valuables.   Use CHG Soap as directed on instruction sheet.  Notify your doctor if there is any change in your medical condition (cold, fever, infection).  Wear comfortable clothing (specific to your surgery type) to the hospital.  After surgery, you can help prevent lung complications by doing breathing exercises.  Take deep breaths and cough every 1-2 hours. Your doctor may order a device called an Incentive Spirometer to help you take deep breaths. When coughing or sneezing, hold a pillow firmly against your incision with both hands. This is called "splinting." Doing this helps protect your incision. It also decreases belly discomfort.  If you are being admitted to the hospital overnight, [place].   If you are being discharged the day of surgery, you will not be allowed to drive home. You will need a responsible adult (18 years or older) to drive you home and stay with you that night.   If you are taking public transportation, you will need to have a responsible adult (18 years or older) with you. Please confirm with your physician that it is acceptable  to use public transportation.   Please call the [place] Dept. at [phone] if you have any questions about these instructions.  Surgery Visitation Policy:  Patients undergoing a surgery or procedure may have one family member or support person with them as long as that person is not COVID-19 positive or experiencing its symptoms.  That person may remain in the waiting area during the  procedure.  Inpatient Visitation:    Visiting hours are 7 a.m. to 8 p.m. Inpatients will be allowed two visitors daily. The visitors may change each day during the patient's stay. No visitors under the age of 73. Any visitor under the age of 45 must be accompanied by an adult. The visitor must pass COVID-19 screenings, use hand sanitizer when entering and exiting the patient's room and wear a mask at all times, including in the patient's room. Patients must also wear a mask when staff or their visitor are in the room. Masking is required regardless of vaccination status.

## 2020-11-03 ENCOUNTER — Encounter
Admission: RE | Admit: 2020-11-03 | Discharge: 2020-11-03 | Disposition: A | Discharge status: Home / Self Care | Payer: Medicare Other | Source: Ambulatory Visit | Attending: Obstetrics and Gynecology | Admitting: Obstetrics and Gynecology

## 2020-11-03 ENCOUNTER — Other Ambulatory Visit: Payer: Self-pay

## 2020-11-03 ENCOUNTER — Encounter: Payer: Self-pay | Admitting: Urgent Care

## 2020-11-03 DIAGNOSIS — Z01818 Encounter for other preprocedural examination: Secondary | ICD-10-CM | POA: Diagnosis not present

## 2020-11-03 DIAGNOSIS — I10 Essential (primary) hypertension: Secondary | ICD-10-CM | POA: Diagnosis not present

## 2020-11-03 DIAGNOSIS — Z01810 Encounter for preprocedural cardiovascular examination: Secondary | ICD-10-CM | POA: Diagnosis not present

## 2020-11-03 LAB — COMPREHENSIVE METABOLIC PANEL
ALT: 11 U/L (ref 0–44)
AST: 19 U/L (ref 15–41)
Albumin: 4 g/dL (ref 3.5–5.0)
Alkaline Phosphatase: 72 U/L (ref 38–126)
Anion gap: 7 (ref 5–15)
BUN: 15 mg/dL (ref 8–23)
CO2: 26 mmol/L (ref 22–32)
Calcium: 9.1 mg/dL (ref 8.9–10.3)
Chloride: 104 mmol/L (ref 98–111)
Creatinine, Ser: 1.03 mg/dL — ABNORMAL HIGH (ref 0.44–1.00)
GFR, Estimated: 55 mL/min — ABNORMAL LOW (ref >60)
Glucose, Bld: 103 mg/dL — ABNORMAL HIGH (ref 70–99)
Potassium: 3.8 mmol/L (ref 3.5–5.1)
Sodium: 137 mmol/L (ref 135–145)
Total Bilirubin: 0.9 mg/dL (ref 0.3–1.2)
Total Protein: 7.5 g/dL (ref 6.5–8.1)

## 2020-11-03 LAB — URINALYSIS, ROUTINE W REFLEX MICROSCOPIC
Bilirubin Urine: NEGATIVE
Glucose, UA: NEGATIVE mg/dL
Hgb urine dipstick: NEGATIVE
Ketones, ur: NEGATIVE mg/dL
Nitrite: POSITIVE — AB
Protein, ur: NEGATIVE mg/dL
Specific Gravity, Urine: 1.005 (ref 1.005–1.030)
pH: 8 (ref 5.0–8.0)

## 2020-11-03 LAB — CBC
HCT: 42.8 % (ref 36.0–46.0)
Hemoglobin: 14.2 g/dL (ref 12.0–15.0)
MCH: 29 pg (ref 26.0–34.0)
MCHC: 33.2 g/dL (ref 30.0–36.0)
MCV: 87.3 fL (ref 80.0–100.0)
Platelets: 280 10*3/uL (ref 150–400)
RBC: 4.9 MIL/uL (ref 3.87–5.11)
RDW: 14.7 % (ref 11.5–15.5)
WBC: 8.3 10*3/uL (ref 4.0–10.5)
nRBC: 0 % (ref 0.0–0.2)

## 2020-11-03 NOTE — Progress Notes (Signed)
  [facility] Perioperative Services: Pre-Admission/Anesthesia Testing  Abnormal Lab Notification and Treatment Plan of Care   Date: 11/03/20  Name: Amanda Duke MRN:   [id number]  Re: Abnormal labs noted during PAT appointment   Provider(s) Notified: Rubie Maid, MD Notification mode: Routed and/or faxed via [place] LAB VALUE(S): Lab Results  Component Value Date   COLORURINE YELLOW (A) 11/03/2020   APPEARANCEUR HAZY (A) 11/03/2020   LABSPEC 1.005 11/03/2020   PHURINE 8.0 11/03/2020   GLUCOSEU NEGATIVE 11/03/2020   HGBUR NEGATIVE 11/03/2020   BILIRUBINUR NEGATIVE 11/03/2020   KETONESUR NEGATIVE 11/03/2020   PROTEINUR NEGATIVE 11/03/2020   UROBILINOGEN 0.2 10/06/2020   NITRITE POSITIVE (A) 11/03/2020   LEUKOCYTESUR LARGE (A) 11/03/2020   EPIU 0-5 11/03/2020   WBCU 21-50 11/03/2020   RBCU 0-5 11/03/2020   BACTERIA FEW (A) 11/03/2020   Notes:   Patient scheduled for ANTERIOR (CYSTOCELE) AND POSTERIOR REPAIR (RECTOCELE) on 11/06/2020.    UA performed in PAT concerning for infection.  No leukocytosis noted on CBC; WBC 8300 Estimated Creatinine Clearance: 45.8 mL/min (A) (by C-G formula based on SCr of 1.03 mg/dL (H)). Urine C&S added to assess for pathogenically significant growth.  Will forward UA, and subsequent C&S results, to attending surgeon for review and Tx as deemed appropriate.   This is a Community education officer; no formal response is required.  Honor Loh, MSN, APRN, FNP-C, CEN [facility]  Peri-operative Services Nurse Practitioner Phone: [phone] Fax: [phone] 11/03/20 1:07 PM

## 2020-11-04 ENCOUNTER — Telehealth: Payer: Self-pay | Admitting: Obstetrics and Gynecology

## 2020-11-04 MED ORDER — CEPHALEXIN 250 MG PO CAPS: 250.0000 mg | ORAL_CAPSULE | Freq: Every day | ORAL | 0 refills | Status: DC

## 2020-11-04 NOTE — Telephone Encounter (Signed)
Contacted patient regarding preoperative labs showing UTI.  Patient with a history of recurrent UTI's.  Discussed need for cancellation of upcoming surgery (anterior repair with TOT sling placement) due to risk of worsening infection.  Patient advised to take refill of Fosamax, and will also prescribe Keflex for suppression therapy (notes GI intolerance with Macrobid). Will reschedule surgery in 1-2 weeks. Notified OR of case cancellation.    Rubie Maid, MD [facility]

## 2020-11-05 LAB — URINE CULTURE: Culture: >=100000 — AB

## 2020-11-06 ENCOUNTER — Ambulatory Visit
Admission: RE | Admit: 2020-11-06 | Payer: Medicare Other | Source: Home / Self Care | Admitting: Obstetrics and Gynecology

## 2020-11-06 SURGERY — ANTERIOR (CYSTOCELE) AND POSTERIOR REPAIR (RECTOCELE)
Anesthesia: General

## 2020-11-08 ENCOUNTER — Telehealth: Payer: Self-pay | Admitting: Pharmacist

## 2020-11-08 LAB — TYPE AND SCREEN
ABO/RH(D): O POS
Antibody Screen: NEGATIVE

## 2020-11-08 NOTE — Progress Notes (Addendum)
Chronic Care Management Pharmacy Assistant   Name: Amanda Duke  MRN: [id number] DOB: 1940-10-10  Amanda Duke is an 80 y.o. year old female who presents for his initial CCM visit with the clinical pharmacist.  Reason for Encounter: Chart Prep   Conditions to be addressed/monitored: HTN, CAD, Anxiety, Insomnia, Vitamin D deficiency, HLD, Osteoporosis, GERD.  Primary concerns for visit include: HTN  Recent office visits:  10/06/20 Mylinda Latina, PA-C. For chronic pain. STARTED Diclofenanc Sodium 2 g topical 4 times daily.  09/08/20 McDonough, Si Gaul, PA-C. For COVID screening. STARTED Azithromycin 250 mg daily for 10 days.  Recent consult visits:  10/13/20 Winnifred Friar, MD. For stress incontinence. No medication changes.  09/28/20 Winnifred Friar, MD. For UTI symptoms. STOPPED Azithromycin, Fluticasone, Nitrofurantoin, Tizanidine.  08/07/20 Fam Med Dorice Lamas. No information given.  Hospital visits:  07/24/20 [facility].No information given.  Medication History: Rosuvastatin 40 mg 90 DS 08/14/20 Losartan 100 mg 90 DS 11/05/20  Medications: Outpatient Encounter Medications as of 11/08/2020  Medication Sig   acetaminophen (TYLENOL) 500 MG tablet Take 1,000 mg by mouth every 6 (six) hours as needed for moderate pain.   aspirin EC 81 MG tablet Take 81 mg by mouth 3 (three) times a week. Swallow whole.   carvedilol (COREG) 3.125 MG tablet Take 3.125 mg by mouth 2 (two) times daily with a meal.    cephALEXin (KEFLEX) 250 MG capsule Take 1 capsule (250 mg total) by mouth daily.   chlorhexidine (PERIDEX) 0.12 % solution Use as directed 15 mLs in the mouth or throat 2 (two) times daily. (Patient not taking: No sig reported)   cholecalciferol (VITAMIN D) 1000 units tablet Take 1,000 Units by mouth daily.   clonazePAM (KLONOPIN) 0.5 MG tablet TAKE 1 TABLET BY MOUTH AT BEDTIME AS NEEDED INSOMNIA/ANXIETY (Patient taking differently: Take 0.5 mg  by mouth at bedtime as needed. TAKE 1 TABLET BY MOUTH AT BEDTIME AS NEEDED INSOMNIA/ANXIETY)   diclofenac Sodium (VOLTAREN) 1 % GEL Apply 2 g topically 4 (four) times daily. (Patient taking differently: Apply 2 g topically 4 (four) times daily as needed (pain).)   diltiazem (CARDIZEM CD) 120 MG 24 hr capsule Take 120 mg by mouth daily.    ergocalciferol (DRISDOL) 1.25 MG (50000 UT) capsule Take 1 capsule (50,000 Units total) by mouth once a week. (Patient not taking: No sig reported)   ibuprofen (ADVIL) 600 MG tablet Take 1 tablet (600 mg total) by mouth 2 (two) times daily as needed. (Patient taking differently: Take 600 mg by mouth 2 (two) times daily as needed for moderate pain.)   icosapent Ethyl (VASCEPA) 1 g capsule Take 2 g by mouth 2 (two) times daily.   linaclotide (LINZESS) 290 MCG CAPS capsule Take 1 capsule (290 mcg total) by mouth daily.   losartan (COZAAR) 100 MG tablet Take 100 mg by mouth daily.   methylPREDNISolone (MEDROL DOSEPAK) 4 MG TBPK tablet Take 4-24 mg by mouth See admin instructions. TAKE 6 TABLETS ON DAY 1 AS DIRECTED ON PACKAGE AND DECREASE BY 1 TAB EACH DAY FOR A TOTAL OF 6 DAYS (Patient not taking: Reported on 11/01/2020)   pantoprazole (PROTONIX) 40 MG tablet Take 40 mg by mouth in the morning.   rosuvastatin (CRESTOR) 20 MG tablet Take 1 tablet (20 mg total) by mouth daily. (Patient not taking: No sig reported)   rosuvastatin (CRESTOR) 40 MG tablet Take 40 mg by mouth at bedtime.   No facility-administered  encounter medications on file as of 11/08/2020.    Have you seen any other providers since your last visit? Patient stated no.  Any changes in your medications or health? Patient stated no.  Any side effects from any medications? Patient stated recurrent UTI but is not sure if it will be solved with her bladder surgery.  Do you have an symptoms or problems not managed by your medications? Patient stated no.  Any concerns about your health right now? Patient  stated no.  Has your provider asked that you check blood pressure, blood sugar, or follow special diet at home? Patient stated no.  Do you get any type of exercise on a regular basis? Patient stated no but she does try to stay busy.  Can you think of a goal you would like to reach for your health? Patient stated she would like for her hip pain to improve.  Do you have any problems getting your medications? Patient stated no.  Is there anything that you would like to discuss during the appointment? Patient stated no.  Please bring medications and supplements to appointment , patient reminded of her face to face appointment on 11/17/20 at 11 am.   [place], [place] Pharmacist Assistant [phone]

## 2020-11-13 NOTE — Progress Notes (Signed)
Chronic Care Management Pharmacy Note  11/17/2020 Name:  Amanda Duke MRN:  [id number] DOB:  1940/08/06  Summary: Initial visit with PharmD.  Med list updated, patient not monitoring BP at home.  Has had a few elevated in office recently.  Updated rosuvastatin dose to 53m in chart.  LDL elevated, patient due for physical and has appt in September.  Recommendations/Changes made from today's visit: Repeat lipid panel Will need DEXA 01/2021  Plan: FU with PharmD 4 months on phone   Subjective: Amanda GEARYis an 80y.o. year old female who is a primary patient of KHumphrey Rolls FTimoteo Gaul MD.  The CCM team was consulted for assistance with disease management and care coordination needs.    Engaged with patient face to face for initial visit in response to provider referral for pharmacy case management and/or care coordination services.   Consent to Services:  The patient was given the following information about Chronic Care Management services today, agreed to services, and gave verbal consent: 1. CCM service includes personalized support from designated clinical staff supervised by the primary care provider, including individualized plan of care and coordination with other care providers 2. 24/7 contact phone numbers for assistance for urgent and routine care needs. 3. Service will only be billed when office clinical staff spend 20 minutes or more in a month to coordinate care. 4. Only one practitioner may furnish and bill the service in a calendar month. 5.The patient may stop CCM services at any time (effective at the end of the month) by phone call to the office staff. 6. The patient will be responsible for cost sharing (co-pay) of up to 20% of the service fee (after annual deductible is met). Patient agreed to services and consent obtained.  Patient Care Team: KLavera Guise MD as PCP - General (Internal Medicine) DEdythe Clarity [facility] (Pharmacist)  Recent office  visits:  10/06/20 MMylinda Latina PA-C. For chronic pain. STARTED Diclofenanc Sodium 2 g topical 4 times daily. 09/08/20 McDonough, LSi Gaul PA-C. For COVID screening. STARTED Azithromycin 250 mg daily for 10 days.   Recent consult visits:  10/13/20 OWinnifred Friar MD. For stress incontinence. No medication changes. 09/28/20 OWinnifred Friar MD. For UTI symptoms. STOPPED Azithromycin, Fluticasone, Nitrofurantoin, Tizanidine. 08/07/20 Fam Med SDorice Lamas No information given.   [facility]   Medication History: Rosuvastatin 40 mg 90 DS 08/14/20 Losartan 100 mg 90 DS 11/05/20   Objective:  Lab Results  Component Value Date   CREATININE 1.03 (H) 11/03/2020   BUN 15 11/03/2020   GFRNONAA 55 (L) 11/03/2020   GFRAA 55 (L) 08/03/2019   NA 137 11/03/2020   K 3.8 11/03/2020   CALCIUM 9.1 11/03/2020   CO2 26 11/03/2020   GLUCOSE 103 (H) 11/03/2020    Lab Results  Component Value Date/Time   HGBA1C 5.7 (H) 02/23/2016 10:46 AM    Last diabetic Eye exam: No results found for: HMDIABEYEEXA  Last diabetic Foot exam: No results found for: HMDIABFOOTEX   Lab Results  Component Value Date   CHOL 244 (H) 08/03/2019   HDL 45 08/03/2019   LDLCALC 156 (H) 08/03/2019   TRIG 213 (H) 08/03/2019   CHOLHDL 5.4 08/03/2019    Hepatic Function Latest Ref Rng & Units 11/03/2020 08/03/2019 01/10/2019  Total Protein 6.5 - 8.1 g/dL 7.5 7.4 7.4  Albumin 3.5 - 5.0 g/dL 4.0 4.1 4.1  AST 15 - 41  U/L 19 19 42(H)  ALT 0 - 44 U/L _0 Alk Phosphatase 38 - 126 U/L 72 81 86  Total Bilirubin 0.3 - 1.2 mg/dL 0.9 0.7 0.9    Lab Results  Component Value Date/Time   TSH 2.355 08/03/2019 10:49 AM   TSH 1.760 05/01/2018 10:27 AM   FREET4 0.76 08/03/2019 10:49 AM   FREET4 0.86 05/01/2018 10:27 AM    CBC Latest Ref Rng & Units 11/03/2020 08/03/2019 01/12/2019  WBC 4.0 - 10.5 K/uL 8.3 6.8 5.8  Hemoglobin 12.0 - 15.0 g/dL 14.2 12.9  11.2(L)  Hematocrit 36.0 - 46.0 % 42.8 40.2 34.1(L)  Platelets 150 - 400 K/uL 280 299 194    Lab Results  Component Value Date/Time   VD25OH 16.17 (L) 08/03/2019 10:49 AM   VD25OH 14.3 (L) 05/01/2018 10:27 AM    Clinical ASCVD: Yes  The 10-year ASCVD risk score Mikey Bussing DC Jr., et al., 2013) is: 41.4%   Values used to calculate the score:     Age: 80 years     Sex: Female     Is Non-Hispanic African American: No     Diabetic: No     Tobacco smoker: No     Systolic Blood Pressure: 983 mmHg     Is BP treated: Yes     HDL Cholesterol: 45 mg/dL     Total Cholesterol: 244 mg/dL    Depression screen [facility] 2/9 10/06/2020 01/04/2020 01/04/2020  Decreased Interest 0 0 0  Down, Depressed, Hopeless 0 0 0  PHQ - 2 Score 0 0 0     Social History   Tobacco Use  Smoking Status Former  Smokeless Tobacco Never   BP Readings from Last 3 Encounters:  10/13/20 (!) 157/89  10/06/20 132/84  09/28/20 (!) 148/83   Pulse Readings from Last 3 Encounters:  10/13/20 68  10/06/20 78  09/28/20 75   Wt Readings from Last 3 Encounters:  11/01/20 180 lb (81.6 kg)  10/13/20 181 lb 14.4 oz (82.5 kg)  10/06/20 180 lb (81.6 kg)   BMI Readings from Last 3 Encounters:  11/01/20 30.90 kg/m  10/13/20 30.74 kg/m  10/06/20 30.42 kg/m    Assessment/Interventions: Review of patient past medical history, allergies, medications, health status, including review of consultants reports, laboratory and other test data, was performed as part of comprehensive evaluation and provision of chronic care management services.   SDOH:  (Social Determinants of Health) assessments and interventions performed: No  Financial Resource Strain: Low Risk    Difficulty of Paying Living Expenses: Not very hard    SDOH Screenings   Alcohol Screen: Low Risk    Last Alcohol Screening Score (AUDIT): 0  Depression (PHQ2-9): Low Risk    PHQ-2 Score: 0  Financial Resource Strain: Low Risk    Difficulty of Paying Living  Expenses: Not very hard  Food Insecurity: Not on file  Housing: Not on file  Physical Activity: Not on file  Social Connections: Not on file  Stress: Not on file  Tobacco Use: Medium Risk   Smoking Tobacco Use: Former   Smokeless Tobacco Use: Never  Transportation Needs: Not on file    CCM Care Plan  Allergies  Allergen Reactions   Amoxicillin Hives    Patient tolerated Ceftriaxone in ED on 01/10/19.    Clindamycin Diarrhea   Gemfibrozil Other (See Comments)    muscle ache   Levofloxacin Other (See Comments)    Muscle ache for several months   Metoprolol Other (  See Comments)    "Lowers blood pressure extremely low"   Simvastatin Other (See Comments)    muscle ache   Macrobid [Nitrofurantoin Macrocrystal] Diarrhea   Niacin And Related Rash   Nystatin-Triamcinolone Rash   Sulfa Antibiotics Rash    Family history of medication allergy    Medications Reviewed Today     Reviewed by Edythe Clarity, [facility] (Pharmacist) on 11/17/20 at 1144  Med List Status: <None>   Medication Order Taking? Sig Documenting Provider Last Dose Status Informant  acetaminophen (TYLENOL) 500 MG tablet [id number] Yes Take 1,000 mg by mouth every 6 (six) hours as needed for moderate pain. [provider] Taking Active Self  aspirin EC 81 MG tablet [id number] Yes Take 81 mg by mouth 3 (three) times a week. Swallow whole. [provider] Taking Active Self  carvedilol (COREG) 3.125 MG tablet [id number]  Take 3.125 mg by mouth 2 (two) times daily with a meal.  [provider]  Expired 11/01/20 2359 Self  cephALEXin (KEFLEX) 250 MG capsule [id number] Yes Take 1 capsule (250 mg total) by mouth daily. Rubie Maid, MD Taking Active   chlorhexidine (PERIDEX) 0.12 % solution [id number] Yes Use as directed 15 mLs in the mouth or throat 2 (two) times daily. Ronnell Freshwater, NP Taking Active Self  cholecalciferol (VITAMIN D) 1000 units tablet [id number] Yes Take 1,000 Units by mouth daily.  [provider] Taking Active Self  clonazePAM (KLONOPIN) 0.5 MG tablet [id number] Yes TAKE 1 TABLET BY MOUTH AT BEDTIME AS NEEDED INSOMNIA/ANXIETY  Patient taking differently: Take 0.5 mg by mouth at bedtime as needed. TAKE 1 TABLET BY MOUTH AT BEDTIME AS NEEDED INSOMNIA/ANXIETY   McDonough, Lauren K, PA-C Taking Active   diclofenac Sodium (VOLTAREN) 1 % GEL [id number] Yes Apply 2 g topically 4 (four) times daily.  Patient taking differently: Apply 2 g topically 4 (four) times daily as needed (pain).   McDonough, Si Gaul, PA-C Taking Active   diltiazem (CARDIZEM CD) 120 MG 24 hr capsule [id number]  Take 120 mg by mouth daily.  [provider]  Expired 10/27/20 2359 Self  ergocalciferol (DRISDOL) 1.25 MG (50000 UT) capsule [id number] Yes Take 1 capsule (50,000 Units total) by mouth once a week. Ronnell Freshwater, NP Taking Active Self  ibuprofen (ADVIL) 600 MG tablet [id number] Yes Take 1 tablet (600 mg total) by mouth 2 (two) times daily as needed.  Patient taking differently: Take 600 mg by mouth 2 (two) times daily as needed for moderate pain.   Ronnell Freshwater, NP Taking Active   icosapent Ethyl (VASCEPA) 1 g capsule [id number] Yes Take 2 g by mouth 2 (two) times daily. [provider] Taking Active Self  linaclotide Rolan Lipa) 290 MCG CAPS capsule [id number] Yes Take 1 capsule (290 mcg total) by mouth daily. Ronnell Freshwater, NP Taking Active Self  losartan (COZAAR) 100 MG tablet [id number] Yes Take 100 mg by mouth daily. [provider] Taking Active Self  methylPREDNISolone (MEDROL DOSEPAK) 4 MG TBPK tablet [id number] Yes Take 4-24 mg by mouth See admin instructions. TAKE 6 TABLETS ON DAY 1 AS DIRECTED ON PACKAGE AND DECREASE BY 1 TAB EACH DAY FOR A TOTAL OF 6 DAYS [provider] Taking Active   pantoprazole (PROTONIX) 40 MG tablet [id number] Yes Take 40 mg by mouth in the morning. [provider] Taking Active Self  rosuvastatin (CRESTOR) 20 MG  tablet [id number] Yes Take 1 tablet (20 mg total) by mouth daily. Ronnell Freshwater, NP Taking  Active Self  rosuvastatin (CRESTOR) 40 MG tablet [id number] Yes Take 40 mg by mouth at bedtime. [provider] Taking Active Self            Patient Active Problem List   Diagnosis Date Noted   Groin strain 03/25/2020   Encounter for general adult medical examination with abnormal findings 01/19/2020   Encounter for screening mammogram for malignant neoplasm of breast 01/19/2020   Infection of tooth socket 01/18/2020   Atherosclerosis of aorta ([place]) 01/12/2020   Left hip pain 06/27/2019   Osteopenia of multiple sites 04/06/2019   Sepsis ([place]) 01/10/2019   Nausea 08/21/2018   Cough 04/16/2018   Screening for osteoporosis 12/24/2017   Generalized anxiety disorder 12/24/2017   Need for shingles vaccine 12/24/2017   Urinary tract infection without hematuria 09/12/2017   Acute sinusitis, unspecified 04/24/2017   Noninfective gastroenteritis and colitis, unspecified 04/24/2017   Solitary pulmonary nodule 04/24/2017   Acute pharyngitis, unspecified 04/24/2017   Chronic kidney disease, 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/2017   Palpitations 04/24/2017   Primary generalized (osteo)arthritis 04/24/2017   Vitamin D deficiency 04/24/2017   Impaired fasting glucose 04/24/2017   Dysuria 04/24/2017   Insomnia, unspecified 04/24/2017   Age-related osteoporosis without current pathological fracture 04/24/2017   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   Paroxysmal supraventricular tachycardia ([place]) 05/23/2015   Family history of breast cancer in first degree relative 02/02/2015   Family history of ovarian cancer 02/02/2015   Irritable bowel syndrome with constipation 02/02/2015   Anxiety 02/02/2015   Osteoporosis 02/02/2015   Benign essential HTN 12/16/2014   Mixed hyperlipidemia 05/19/2014   Acid reflux 05/19/2014   Arteriosclerosis of coronary artery 05/19/2014   Chest pain 05/19/2014    Gastroesophageal reflux disease 05/19/2014   Coronary artery disease 05/19/2014   Cystocele, midline 07/01/2013   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   Urge incontinence 17/00/1749   Renal colic 44/96/7591   Symptoms involving urinary system 11/20/2012   Incomplete bladder emptying 11/20/2012   Frank hematuria 11/20/2012   Bladder infection, chronic 11/20/2012    Immunization History  Administered Date(s) Administered   Fluad Quad(high Dose 65+) 01/12/2019   Influenza, High Dose Seasonal PF 03/07/2017, 12/30/2017   Influenza, Seasonal, Injecte, Preservative Fre 03/01/2005   Influenza-Unspecified 12/30/2017   PFIZER(Purple Top)SARS-COV-2 Vaccination 08/25/2019   Pneumococcal Conjugate-13 03/07/2017   Pneumococcal Polysaccharide-23 12/27/2004, 07/22/2018    Conditions to be addressed/monitored:  HTN, CAD, GERD, IBS-C, Osteoporosis, HLD  Care Plan : General Pharmacy (Adult)  Updates made by Edythe Clarity, RPH since 11/17/2020 12:00 AM     Problem: HTN, CAD, GERD, IBS-C, Osteoporosis, HLD   Priority: High  Onset Date: 11/17/2020     Long-Range Goal: Patient-Specific Goal   Start Date: 11/17/2020  Expected End Date: 05/20/2021  This Visit's Progress: On track  Priority: High  Note:   Current Barriers:  Unable to achieve control of cholesterol   Pharmacist Clinical Goal(s):  Patient will achieve control of LDL as evidenced by lipid panel adhere to prescribed medication regimen as evidenced by fill dates contact provider office for questions/concerns as evidenced notation of same in electronic health record through collaboration with PharmD and provider.   Interventions: 1:1 collaboration with Lavera Guise, MD regarding development and update of comprehensive plan of care as evidenced by provider attestation and co-signature Inter-disciplinary care team collaboration (see longitudinal plan of care) Comprehensive  medication review performed; medication list updated in electronic medical record  Hypertension (BP goal <140/90) -Controlled -Current treatment: Losartan 166m Carvedilol 3.1236mBID Diltiazed CD 12091maily -Medications previously tried: none noted  -Current home readings: patient not monitoring at home  -Denies hypotensive/hypertensive symptoms -Educated on BP goals and benefits of medications for prevention of heart attack, stroke and kidney damage; Daily salt intake goal < 2300 mg; Exercise goal of 150 minutes per week; Importance of home blood pressure monitoring; Symptoms of hypotension and importance of maintaining adequate hydration; -Counseled to monitor BP at home periodically, document, and provide log at future appointments -Recommended to continue current medication  Hyperlipidemia: (LDL goal < 100) -Uncontrolled -Current treatment: Rosuvastatin 67m9mily Vascepa 1g daily -Medications previously tried: simvastatin (myalgia)  -Educated on Cholesterol goals;  Benefits of statin for ASCVD risk reduction; Importance of limiting foods high in cholesterol; Exercise goal of 150 minutes per week; -Most recent LDL from 07/2019 - elevated -She has since has dose of Crestor increased to 67mg23mas not had lipid panel since dose increase -Recommended to continue current medication Recommended recheck lipids at next upcoming physical  Osteopenia (Goal Maintian bone density) -Controlled -Last DEXA Scan: 02/15/2019    T-Score lumbar spine: -2.2  -Patient is not a candidate for pharmacologic treatment -Current treatment  Vitamin D 1000 IU daily -Medications previously tried: none noted  -Recommend [phone] units of vitamin D daily. Recommend 1200 mg of calcium daily from dietary and supplemental sources. Recommend weight-bearing and muscle strengthening exercises for building and maintaining bone density. -Recommend repeat DEXA 01/2021 -Recommended to continue current  medication  IBS-C (Goal: Minimize symptoms) -Controlled -Current treatment  Linzess 290 mcg daily -Medications previously tried: none noted -She reports bowels are regular with no issues with medication or symptoms currently  -Recommended to continue current medication Assessed patient finances. Current copay is free!  GERD (Goal: Minimize symptoms) -Controlled -Current treatment  Pantoprazole 67mg 19my -Medications previously tried: none noted -Patient also has hiatal hernia - takes medication almost on a daily basis -Denies any recent symptoms  -Recommended to continue current medication  Patient Goals/Self-Care Activities Patient will:  - take medications as prescribed check blood pressure periodically, document, and provide at future appointments target a minimum of 150 minutes of moderate intensity exercise weekly  Follow Up Plan: The care management team will reach out to the patient again over the next 120 days.         Medication Assistance: None required.  Patient affirms current coverage meets needs.  Compliance/Adherence/Medication fill history: Care Gaps: Zoster Vaccines  Star-Rating Drugs: Rosuvastatin 40 mg 90 DS 08/14/20 Losartan 100 mg 90 DS 11/05/20  Patient's preferred pharmacy is:  [id number] [place] - [id number] [phone] box? No - takes out of bag Pt endorses 100% compliance  We discussed: Benefits of medication synchronization, packaging and delivery as well as enhanced pharmacist oversight with [employer]. Patient decided to: Continue current medication management strategy  Care Plan and Follow Up Patient Decision:  Patient agrees to Care Plan and Follow-up.  Plan: The care management team will reach out to the patient again over the next 120 days.  ChristiBeverly MilchD Clinical Pharmacist [phone]

## 2020-11-17 ENCOUNTER — Other Ambulatory Visit: Payer: Self-pay

## 2020-11-17 ENCOUNTER — Telehealth: Payer: Self-pay | Admitting: Obstetrics and Gynecology

## 2020-11-17 ENCOUNTER — Encounter: Payer: Self-pay | Admitting: Obstetrics and Gynecology

## 2020-11-17 ENCOUNTER — Ambulatory Visit: Payer: Medicare Other | Admitting: Pharmacist

## 2020-11-17 DIAGNOSIS — M8589 Other specified disorders of bone density and structure, multiple sites: Secondary | ICD-10-CM

## 2020-11-17 DIAGNOSIS — I10 Essential (primary) hypertension: Secondary | ICD-10-CM

## 2020-11-17 DIAGNOSIS — E782 Mixed hyperlipidemia: Secondary | ICD-10-CM

## 2020-11-17 NOTE — Telephone Encounter (Signed)
No answer. Will try later

## 2020-11-17 NOTE — Patient Instructions (Addendum)
Visit Information   Goals Addressed             This Visit's Progress    Track and Manage My Blood Pressure-Hypertension       Timeframe:  Long-Range Goal Priority:  High Start Date: 11/17/20                            Expected End Date: 05/20/21                      Follow Up Date 02/26/21    - check blood pressure 3 times per week - choose a place to take my blood pressure (home, clinic or office, retail store) - write blood pressure results in a log or diary    Why is this important?   You won't feel high blood pressure, but it can still hurt your blood vessels.  High blood pressure can cause heart or kidney problems. It can also cause a stroke.  Making lifestyle changes like losing a little weight or eating less salt will help.  Checking your blood pressure at home and at different times of the day can help to control blood pressure.  If the doctor prescribes medicine remember to take it the way the doctor ordered.  Call the office if you cannot afford the medicine or if there are questions about it.     Notes:        Patient Care Plan: General Pharmacy (Adult)     Problem Identified: HTN, CAD, GERD, IBS-C, Osteoporosis, HLD   Priority: High  Onset Date: 11/17/2020     Long-Range Goal: Patient-Specific Goal   Start Date: 11/17/2020  Expected End Date: 05/20/2021  This Visit's Progress: On track  Priority: High  Note:   Current Barriers:  Unable to achieve control of cholesterol   Pharmacist Clinical Goal(s):  Patient will achieve control of LDL as evidenced by lipid panel adhere to prescribed medication regimen as evidenced by fill dates contact provider office for questions/concerns as evidenced notation of same in electronic health record through collaboration with PharmD and provider.   Interventions: 1:1 collaboration with Lavera Guise, MD regarding development and update of comprehensive plan of care as evidenced by provider attestation and  co-signature Inter-disciplinary care team collaboration (see longitudinal plan of care) Comprehensive medication review performed; medication list updated in electronic medical record  Hypertension (BP goal <140/90) -Controlled -Current treatment: Losartan '100mg'$  Carvedilol 3.'125mg'$  BID Diltiazed CD '120mg'$  daily -Medications previously tried: none noted  -Current home readings: patient not monitoring at home  -Denies hypotensive/hypertensive symptoms -Educated on BP goals and benefits of medications for prevention of heart attack, stroke and kidney damage; Daily salt intake goal < 2300 mg; Exercise goal of 150 minutes per week; Importance of home blood pressure monitoring; Symptoms of hypotension and importance of maintaining adequate hydration; -Counseled to monitor BP at home periodically, document, and provide log at future appointments -Recommended to continue current medication  Hyperlipidemia: (LDL goal < 100) -Uncontrolled -Current treatment: Rosuvastatin '40mg'$  daily Vascepa 1g daily -Medications previously tried: simvastatin (myalgia)  -Educated on Cholesterol goals;  Benefits of statin for ASCVD risk reduction; Importance of limiting foods high in cholesterol; Exercise goal of 150 minutes per week; -Most recent LDL from 07/2019 - elevated -She has since has dose of Crestor increased to '40mg'$  - has not had lipid panel since dose increase -Recommended to continue current medication Recommended recheck lipids at next upcoming  physical  Osteopenia (Goal [place] bone density) -Controlled -Last DEXA Scan: 02/15/2019    T-Score lumbar spine: -2.2  -Patient is not a candidate for pharmacologic treatment -Current treatment  Vitamin D 1000 IU daily -Medications previously tried: none noted  -Recommend [phone] units of vitamin D daily. Recommend 1200 mg of calcium daily from dietary and supplemental sources. Recommend weight-bearing and muscle strengthening exercises for  building and maintaining bone density. -Recommend repeat DEXA 01/2021 -Recommended to continue current medication  IBS-C (Goal: Minimize symptoms) -Controlled -Current treatment  Linzess 290 mcg daily -Medications previously tried: none noted -She reports bowels are regular with no issues with medication or symptoms currently  -Recommended to continue current medication Assessed patient finances. Current copay is free!  GERD (Goal: Minimize symptoms) -Controlled -Current treatment  Pantoprazole '40mg'$  daily -Medications previously tried: none noted -Patient also has hiatal hernia - takes medication almost on a daily basis -Denies any recent symptoms  -Recommended to continue current medication  Patient Goals/Self-Care Activities Patient will:  - take medications as prescribed check blood pressure periodically, document, and provide at future appointments target a minimum of 150 minutes of moderate intensity exercise weekly  Follow Up Plan: The care management team will reach out to the patient again over the next 120 days.        Ms. Egle was given information about Chronic Care Management services today including:  CCM service includes personalized support from designated clinical staff supervised by her physician, including individualized plan of care and coordination with other care providers 24/7 contact phone numbers for assistance for urgent and routine care needs. Standard insurance, coinsurance, copays and deductibles apply for chronic care management only during months in which we provide at least 20 minutes of these services. Most insurances cover these services at 100%, however patients may be responsible for any copay, coinsurance and/or deductible if applicable. This service may help you avoid the need for more expensive face-to-face services. Only one practitioner may furnish and bill the service in a calendar month. The patient may stop CCM services at any time  (effective at the end of the month) by phone call to the office staff.  Patient agreed to services and verbal consent obtained.   The patient verbalized understanding of instructions, educational materials, and care plan provided today and agreed to receive a mailed copy of patient instructions, educational materials, and care plan.  Telephone follow up appointment with pharmacy team member scheduled for: 4 months  Edythe Clarity, [place]

## 2020-11-17 NOTE — Telephone Encounter (Signed)
Patient called in and stated she wanted to cancel her surgery for Monday with DR. [place].  Patient states she just doesn't feel up to it.

## 2020-11-20 ENCOUNTER — Ambulatory Visit
Admission: RE | Admit: 2020-11-20 | Payer: Medicare Other | Source: Home / Self Care | Admitting: Obstetrics and Gynecology

## 2020-11-20 ENCOUNTER — Encounter: Admission: RE | Payer: Self-pay | Source: Home / Self Care

## 2020-11-20 SURGERY — ANTERIOR (CYSTOCELE) AND POSTERIOR REPAIR (RECTOCELE)
Anesthesia: General

## 2020-11-23 NOTE — Telephone Encounter (Signed)
No answer

## 2020-11-24 ENCOUNTER — Telehealth: Payer: Self-pay | Admitting: Pharmacist

## 2020-11-24 NOTE — Progress Notes (Addendum)
    Chronic Care Management Pharmacy Assistant   Name: Amanda Duke  MRN: [id number] DOB: 06/05/1940  Reason for Encounter: CCM Care Plan/Adherence Review  Medications: Outpatient Encounter Medications as of 11/24/2020  Medication Sig   acetaminophen (TYLENOL) 500 MG tablet Take 1,000 mg by mouth every 6 (six) hours as needed for moderate pain.   aspirin EC 81 MG tablet Take 81 mg by mouth 3 (three) times a week. Swallow whole.   carvedilol (COREG) 3.125 MG tablet Take 3.125 mg by mouth 2 (two) times daily with a meal.    cephALEXin (KEFLEX) 250 MG capsule Take 1 capsule (250 mg total) by mouth daily.   chlorhexidine (PERIDEX) 0.12 % solution Use as directed 15 mLs in the mouth or throat 2 (two) times daily.   cholecalciferol (VITAMIN D) 1000 units tablet Take 1,000 Units by mouth daily.   clonazePAM (KLONOPIN) 0.5 MG tablet TAKE 1 TABLET BY MOUTH AT BEDTIME AS NEEDED INSOMNIA/ANXIETY (Patient taking differently: Take 0.5 mg by mouth at bedtime as needed. TAKE 1 TABLET BY MOUTH AT BEDTIME AS NEEDED INSOMNIA/ANXIETY)   diclofenac Sodium (VOLTAREN) 1 % GEL Apply 2 g topically 4 (four) times daily. (Patient taking differently: Apply 2 g topically 4 (four) times daily as needed (pain).)   diltiazem (CARDIZEM CD) 120 MG 24 hr capsule Take 120 mg by mouth daily.    ergocalciferol (DRISDOL) 1.25 MG (50000 UT) capsule Take 1 capsule (50,000 Units total) by mouth once a week.   ibuprofen (ADVIL) 600 MG tablet Take 1 tablet (600 mg total) by mouth 2 (two) times daily as needed. (Patient taking differently: Take 600 mg by mouth 2 (two) times daily as needed for moderate pain.)   icosapent Ethyl (VASCEPA) 1 g capsule Take 2 g by mouth 2 (two) times daily.   linaclotide (LINZESS) 290 MCG CAPS capsule Take 1 capsule (290 mcg total) by mouth daily.   losartan (COZAAR) 100 MG tablet Take 100 mg by mouth daily.   methylPREDNISolone (MEDROL DOSEPAK) 4 MG TBPK tablet Take 4-24 mg by mouth See admin  instructions. TAKE 6 TABLETS ON DAY 1 AS DIRECTED ON PACKAGE AND DECREASE BY 1 TAB EACH DAY FOR A TOTAL OF 6 DAYS   pantoprazole (PROTONIX) 40 MG tablet Take 40 mg by mouth in the morning.   rosuvastatin (CRESTOR) 20 MG tablet Take 1 tablet (20 mg total) by mouth daily.   rosuvastatin (CRESTOR) 40 MG tablet Take 40 mg by mouth at bedtime.   No facility-administered encounter medications on file as of 11/24/2020.   Reviewed the patients chart for any medical/health and/or medication changes there not any changes at this time. Printed and mailed the patients CCM care plan after reviewing and ensuring it was signed and completed.   Follow-Up:Pharmacist Review  Charlann Lange, [place] Pharmacist Assistant [phone]

## 2020-11-26 DIAGNOSIS — E782 Mixed hyperlipidemia: Secondary | ICD-10-CM | POA: Diagnosis not present

## 2020-11-26 DIAGNOSIS — I10 Essential (primary) hypertension: Secondary | ICD-10-CM | POA: Diagnosis not present

## 2020-12-04 DIAGNOSIS — M1611 Unilateral primary osteoarthritis, right hip: Secondary | ICD-10-CM | POA: Diagnosis not present

## 2020-12-05 DIAGNOSIS — K5909 Other constipation: Secondary | ICD-10-CM | POA: Diagnosis not present

## 2020-12-05 DIAGNOSIS — R1013 Epigastric pain: Secondary | ICD-10-CM | POA: Diagnosis not present

## 2020-12-11 DIAGNOSIS — Z01818 Encounter for other preprocedural examination: Secondary | ICD-10-CM | POA: Diagnosis not present

## 2020-12-11 DIAGNOSIS — I10 Essential (primary) hypertension: Secondary | ICD-10-CM | POA: Diagnosis not present

## 2020-12-11 DIAGNOSIS — E782 Mixed hyperlipidemia: Secondary | ICD-10-CM | POA: Diagnosis not present

## 2020-12-11 DIAGNOSIS — I2510 Atherosclerotic heart disease of native coronary artery without angina pectoris: Secondary | ICD-10-CM | POA: Diagnosis not present

## 2020-12-11 DIAGNOSIS — I471 Supraventricular tachycardia: Secondary | ICD-10-CM | POA: Diagnosis not present

## 2020-12-28 DIAGNOSIS — Q619 Cystic kidney disease, unspecified: Secondary | ICD-10-CM | POA: Diagnosis not present

## 2020-12-28 DIAGNOSIS — M810 Age-related osteoporosis without current pathological fracture: Secondary | ICD-10-CM | POA: Diagnosis not present

## 2020-12-28 DIAGNOSIS — Z7982 Long term (current) use of aspirin: Secondary | ICD-10-CM | POA: Diagnosis not present

## 2020-12-28 DIAGNOSIS — M1611 Unilateral primary osteoarthritis, right hip: Secondary | ICD-10-CM | POA: Diagnosis not present

## 2020-12-28 DIAGNOSIS — K449 Diaphragmatic hernia without obstruction or gangrene: Secondary | ICD-10-CM | POA: Diagnosis not present

## 2020-12-28 DIAGNOSIS — I10 Essential (primary) hypertension: Secondary | ICD-10-CM | POA: Diagnosis not present

## 2020-12-28 DIAGNOSIS — Z880 Allergy status to penicillin: Secondary | ICD-10-CM | POA: Diagnosis not present

## 2020-12-28 DIAGNOSIS — K589 Irritable bowel syndrome without diarrhea: Secondary | ICD-10-CM | POA: Diagnosis not present

## 2020-12-28 DIAGNOSIS — I471 Supraventricular tachycardia: Secondary | ICD-10-CM | POA: Diagnosis not present

## 2020-12-28 DIAGNOSIS — Z96651 Presence of right artificial knee joint: Secondary | ICD-10-CM | POA: Diagnosis not present

## 2020-12-28 DIAGNOSIS — G4700 Insomnia, unspecified: Secondary | ICD-10-CM | POA: Diagnosis not present

## 2020-12-28 DIAGNOSIS — Z882 Allergy status to sulfonamides status: Secondary | ICD-10-CM | POA: Diagnosis not present

## 2020-12-28 DIAGNOSIS — R911 Solitary pulmonary nodule: Secondary | ICD-10-CM | POA: Diagnosis not present

## 2020-12-28 DIAGNOSIS — Z888 Allergy status to other drugs, medicaments and biological substances status: Secondary | ICD-10-CM | POA: Diagnosis not present

## 2020-12-28 DIAGNOSIS — K219 Gastro-esophageal reflux disease without esophagitis: Secondary | ICD-10-CM | POA: Diagnosis not present

## 2020-12-28 DIAGNOSIS — I2510 Atherosclerotic heart disease of native coronary artery without angina pectoris: Secondary | ICD-10-CM | POA: Diagnosis not present

## 2020-12-28 DIAGNOSIS — E559 Vitamin D deficiency, unspecified: Secondary | ICD-10-CM | POA: Diagnosis not present

## 2020-12-28 DIAGNOSIS — Z881 Allergy status to other antibiotic agents status: Secondary | ICD-10-CM | POA: Diagnosis not present

## 2020-12-28 DIAGNOSIS — E785 Hyperlipidemia, unspecified: Secondary | ICD-10-CM | POA: Diagnosis not present

## 2020-12-29 DIAGNOSIS — K219 Gastro-esophageal reflux disease without esophagitis: Secondary | ICD-10-CM | POA: Diagnosis not present

## 2020-12-29 DIAGNOSIS — E559 Vitamin D deficiency, unspecified: Secondary | ICD-10-CM | POA: Diagnosis not present

## 2020-12-29 DIAGNOSIS — Z882 Allergy status to sulfonamides status: Secondary | ICD-10-CM | POA: Diagnosis not present

## 2020-12-29 DIAGNOSIS — I471 Supraventricular tachycardia: Secondary | ICD-10-CM | POA: Diagnosis not present

## 2020-12-29 DIAGNOSIS — K449 Diaphragmatic hernia without obstruction or gangrene: Secondary | ICD-10-CM | POA: Diagnosis not present

## 2020-12-29 DIAGNOSIS — Q619 Cystic kidney disease, unspecified: Secondary | ICD-10-CM | POA: Diagnosis not present

## 2020-12-29 DIAGNOSIS — G4700 Insomnia, unspecified: Secondary | ICD-10-CM | POA: Diagnosis not present

## 2020-12-29 DIAGNOSIS — Z7982 Long term (current) use of aspirin: Secondary | ICD-10-CM | POA: Diagnosis not present

## 2020-12-29 DIAGNOSIS — I2510 Atherosclerotic heart disease of native coronary artery without angina pectoris: Secondary | ICD-10-CM | POA: Diagnosis not present

## 2020-12-29 DIAGNOSIS — Z888 Allergy status to other drugs, medicaments and biological substances status: Secondary | ICD-10-CM | POA: Diagnosis not present

## 2020-12-29 DIAGNOSIS — Z9889 Other specified postprocedural states: Secondary | ICD-10-CM | POA: Insufficient documentation

## 2020-12-29 DIAGNOSIS — M1611 Unilateral primary osteoarthritis, right hip: Secondary | ICD-10-CM | POA: Diagnosis not present

## 2020-12-29 DIAGNOSIS — Z881 Allergy status to other antibiotic agents status: Secondary | ICD-10-CM | POA: Diagnosis not present

## 2020-12-29 DIAGNOSIS — Z96651 Presence of right artificial knee joint: Secondary | ICD-10-CM | POA: Diagnosis not present

## 2020-12-29 DIAGNOSIS — M810 Age-related osteoporosis without current pathological fracture: Secondary | ICD-10-CM | POA: Diagnosis not present

## 2020-12-29 DIAGNOSIS — Z880 Allergy status to penicillin: Secondary | ICD-10-CM | POA: Diagnosis not present

## 2020-12-29 DIAGNOSIS — K589 Irritable bowel syndrome without diarrhea: Secondary | ICD-10-CM | POA: Diagnosis not present

## 2020-12-29 DIAGNOSIS — I10 Essential (primary) hypertension: Secondary | ICD-10-CM | POA: Diagnosis not present

## 2020-12-29 DIAGNOSIS — R911 Solitary pulmonary nodule: Secondary | ICD-10-CM | POA: Diagnosis not present

## 2021-01-04 DIAGNOSIS — M1611 Unilateral primary osteoarthritis, right hip: Secondary | ICD-10-CM | POA: Diagnosis not present

## 2021-01-09 DIAGNOSIS — Z96649 Presence of unspecified artificial hip joint: Secondary | ICD-10-CM | POA: Insufficient documentation

## 2021-01-09 DIAGNOSIS — M1611 Unilateral primary osteoarthritis, right hip: Secondary | ICD-10-CM | POA: Diagnosis not present

## 2021-01-11 ENCOUNTER — Encounter: Payer: Self-pay | Admitting: Physician Assistant

## 2021-01-11 ENCOUNTER — Other Ambulatory Visit: Payer: Self-pay

## 2021-01-11 ENCOUNTER — Telehealth: Payer: Self-pay

## 2021-01-11 ENCOUNTER — Ambulatory Visit (INDEPENDENT_AMBULATORY_CARE_PROVIDER_SITE_OTHER): Payer: Medicare Other | Admitting: Physician Assistant

## 2021-01-11 DIAGNOSIS — I700 Atherosclerosis of aorta: Secondary | ICD-10-CM

## 2021-01-11 DIAGNOSIS — I10 Essential (primary) hypertension: Secondary | ICD-10-CM | POA: Diagnosis not present

## 2021-01-11 DIAGNOSIS — E782 Mixed hyperlipidemia: Secondary | ICD-10-CM | POA: Diagnosis not present

## 2021-01-11 DIAGNOSIS — R300 Dysuria: Secondary | ICD-10-CM

## 2021-01-11 DIAGNOSIS — I471 Supraventricular tachycardia, unspecified: Secondary | ICD-10-CM

## 2021-01-11 DIAGNOSIS — Z0001 Encounter for general adult medical examination with abnormal findings: Secondary | ICD-10-CM | POA: Diagnosis not present

## 2021-01-11 DIAGNOSIS — Z96641 Presence of right artificial hip joint: Secondary | ICD-10-CM

## 2021-01-11 NOTE — Telephone Encounter (Signed)
Attempted to contact patient to schedule today's visit f/u. No answer on either phone or voicemail set up-Toni

## 2021-01-11 NOTE — Progress Notes (Signed)
[facility] [place], [place] [address]  Internal MEDICINE  Office Visit Note  Patient Name: Amanda Duke  [id number]  [id number]  Date of Service: 01/14/2021  Chief Complaint  Patient presents with   Medicare Wellness   Hypertension   Hyperlipidemia     HPI Pt is here for routine health maintenance examination -Had her R hip replacement 2 weeks ago and is feeling much better. She is walking with walker and doing PT. Already had staples out and was told incision looked good. -BP meds not taken today, will take once she gets home -Blood work not done yet due to surgery and limitations with driving. -Doubled ASA since surgery and is supposed to do this for a few weeks -had breast exam with OBGYN during visit for possible surgery for bladder lift and possible rectocele. She initially declined surgery but may be reconsidering. May decide to try pessary first. -BP at home normally well controlled  Current Medication: Outpatient Encounter Medications as of 01/11/2021  Medication Sig   acetaminophen (TYLENOL) 500 MG tablet Take 1,000 mg by mouth every 6 (six) hours as needed for moderate pain.   aspirin EC 81 MG tablet Take 81 mg by mouth 2 (two) times daily. Swallow whole.   chlorhexidine (PERIDEX) 0.12 % solution Use as directed 15 mLs in the mouth or throat 2 (two) times daily.   cholecalciferol (VITAMIN D) 1000 units tablet Take 1,000 Units by mouth daily.   clonazePAM (KLONOPIN) 0.5 MG tablet TAKE 1 TABLET BY MOUTH AT BEDTIME AS NEEDED INSOMNIA/ANXIETY (Patient taking differently: Take 0.5 mg by mouth at bedtime as needed. TAKE 1 TABLET BY MOUTH AT BEDTIME AS NEEDED INSOMNIA/ANXIETY)   diclofenac Sodium (VOLTAREN) 1 % GEL Apply 2 g topically 4 (four) times daily. (Patient taking differently: Apply 2 g topically 4 (four) times daily as needed (pain).)   ergocalciferol (DRISDOL) 1.25 MG (50000 UT) capsule Take 1 capsule (50,000 Units total) by mouth once a  week.   ibuprofen (ADVIL) 600 MG tablet Take 1 tablet (600 mg total) by mouth 2 (two) times daily as needed. (Patient taking differently: Take 600 mg by mouth 2 (two) times daily as needed for moderate pain.)   icosapent Ethyl (VASCEPA) 1 g capsule Take 2 g by mouth 2 (two) times daily.   linaclotide (LINZESS) 290 MCG CAPS capsule Take 1 capsule (290 mcg total) by mouth daily.   losartan (COZAAR) 100 MG tablet Take 100 mg by mouth daily.   methylPREDNISolone (MEDROL DOSEPAK) 4 MG TBPK tablet Take 4-24 mg by mouth See admin instructions. TAKE 6 TABLETS ON DAY 1 AS DIRECTED ON PACKAGE AND DECREASE BY 1 TAB EACH DAY FOR A TOTAL OF 6 DAYS   pantoprazole (PROTONIX) 40 MG tablet Take 40 mg by mouth in the morning.   rosuvastatin (CRESTOR) 20 MG tablet Take 1 tablet (20 mg total) by mouth daily.   rosuvastatin (CRESTOR) 40 MG tablet Take 40 mg by mouth at bedtime.   [DISCONTINUED] cephALEXin (KEFLEX) 250 MG capsule Take 1 capsule (250 mg total) by mouth daily.   carvedilol (COREG) 3.125 MG tablet Take 3.125 mg by mouth 2 (two) times daily with a meal.    diltiazem (CARDIZEM CD) 120 MG 24 hr capsule Take 120 mg by mouth daily.    No facility-administered encounter medications on file as of 01/11/2021.    Surgical History: Past Surgical History:  Procedure Laterality Date   [place]  BACK SURGERY  1980   COLONOSCOPY WITH PROPOFOL N/A 12/12/2016   Procedure: COLONOSCOPY WITH PROPOFOL;  Surgeon: Lollie Sails, MD;  Location: [facility] ENDOSCOPY;  Service: Endoscopy;  Laterality: N/A;   COLONOSCOPY WITH PROPOFOL N/A 07/14/2019   Procedure: COLONOSCOPY WITH PROPOFOL;  Surgeon: Toledo, Benay Pike, MD;  Location: [facility] ENDOSCOPY;  Service: Gastroenterology;  Laterality: N/A;   ESOPHAGOGASTRODUODENOSCOPY (EGD) WITH PROPOFOL N/A 12/12/2016   Procedure: ESOPHAGOGASTRODUODENOSCOPY (EGD) WITH PROPOFOL;  Surgeon: Lollie Sails, MD;  Location: [facility] ENDOSCOPY;  Service:  Endoscopy;  Laterality: N/A;   ESOPHAGOGASTRODUODENOSCOPY (EGD) WITH PROPOFOL N/A 07/14/2019   Procedure: ESOPHAGOGASTRODUODENOSCOPY (EGD) WITH PROPOFOL;  Surgeon: Toledo, Benay Pike, MD;  Location: [facility] ENDOSCOPY;  Service: Gastroenterology;  Laterality: N/A;   LAPAROSCOPIC UNILATERAL SALPINGO OOPHERECTOMY  1985   right   ORIF ANKLE FRACTURE Right    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    TONSILLECTOMY  1948   TUBAL LIGATION  1971   VAGINAL HYSTERECTOMY  1985   d/t heavy bleeding    Medical History: Past Medical History:  Diagnosis Date   Anxiety    Coronary artery disease    Gastritis    GERD (gastroesophageal reflux disease)    History of colonic polyps    Hyperlipemia    Hypertension    Irregular heart beat    Pneumonia    Pre-diabetes    Prediabetes    Redundant colon    Renal cyst    bilateral    Family History: Family History  Problem Relation Age of Onset   Heart failure Mother    Stroke Mother    Breast cancer Mother        dx at age 25 yo   Diabetes Mother    Osteoporosis Mother    Emphysema Father    Ovarian cancer Sister        dx at age 30 yo   Asthma Sister    Diabetes Daughter    Heart disease Daughter    COPD Daughter       Review of Systems  Constitutional:  Negative for chills, fatigue and unexpected weight change.  HENT:  Negative for congestion, postnasal drip, rhinorrhea, sneezing and sore throat.   Eyes:  Negative for redness.  Respiratory:  Negative for cough, chest tightness and shortness of breath.   Cardiovascular:  Negative for chest pain and palpitations.  Gastrointestinal:  Negative for abdominal pain, constipation, diarrhea, nausea and vomiting.  Genitourinary:  Negative for dysuria and frequency.  Musculoskeletal:  Negative for arthralgias, back pain, joint swelling and neck pain.       Recovering from R hip replacement  Skin:  Negative for rash.  Neurological: Negative.  Negative for tremors and numbness.   Hematological:  Negative for adenopathy. Does not bruise/bleed easily.  Psychiatric/Behavioral:  Negative for behavioral problems (Depression), sleep disturbance and suicidal ideas. The patient is not nervous/anxious.     Vital Signs: BP (!) 162/80   Pulse 66   Temp 97.8 F (36.6 C)   Resp 16   Ht 5' 4.5" (1.638 m)   Wt 181 lb (82.1 kg)   SpO2 97%   BMI 30.59 kg/m    Physical Exam Vitals and nursing note reviewed.  Constitutional:      General: She is not in acute distress.    Appearance: She is well-developed. She is obese. She is not diaphoretic.  HENT:     Head: Normocephalic and atraumatic.     Right Ear: External ear  normal.     Left Ear: External ear normal.     Nose: Nose normal.     Mouth/Throat:     Pharynx: No oropharyngeal exudate.  Eyes:     General: No scleral icterus.       Right eye: No discharge.        Left eye: No discharge.     Conjunctiva/sclera: Conjunctivae normal.     Pupils: Pupils are equal, round, and reactive to light.  Neck:     Thyroid: No thyromegaly.     Vascular: No JVD.     Trachea: No tracheal deviation.  Cardiovascular:     Rate and Rhythm: Normal rate and regular rhythm.     Heart sounds: Normal heart sounds. No murmur heard.   No friction rub. No gallop.  Pulmonary:     Effort: Pulmonary effort is normal. No respiratory distress.     Breath sounds: Normal breath sounds. No stridor. No wheezing or rales.  Chest:     Chest wall: No tenderness.  Abdominal:     General: Bowel sounds are normal. There is no distension.     Palpations: Abdomen is soft. There is no mass.     Tenderness: There is no abdominal tenderness. There is no guarding or rebound.  Musculoskeletal:        General: No tenderness or deformity. Normal range of motion.     Cervical back: Normal range of motion and neck supple.  Lymphadenopathy:     Cervical: No cervical adenopathy.  Skin:    General: Skin is warm and dry.     Coloration: Skin is not pale.      Findings: No erythema or rash.     Comments: Well healing incision along right hip  Neurological:     Mental Status: She is alert.     Cranial Nerves: No cranial nerve deficit.     Motor: No abnormal muscle tone.     Coordination: Coordination normal.     Gait: Gait abnormal.     Deep Tendon Reflexes: Reflexes are normal and symmetric.     Comments: Walking with walker due to Total hip replacement  Psychiatric:        Behavior: Behavior normal.        Thought Content: Thought content normal.        Judgment: Judgment normal.     LABS: Recent Results (from the past 2160 hour(s))  Comprehensive metabolic panel     Status: Abnormal   Collection Time: 11/03/20 10:50 AM  Result Value Ref Range   Sodium 137 135 - 145 mmol/L   Potassium 3.8 3.5 - 5.1 mmol/L   Chloride 104 98 - 111 mmol/L   CO2 26 22 - 32 mmol/L   Glucose, Bld 103 (H) 70 - 99 mg/dL    Comment: Glucose reference range applies only to samples taken after fasting for at least 8 hours.   BUN 15 8 - 23 mg/dL   Creatinine, Ser 1.03 (H) 0.44 - 1.00 mg/dL   Calcium 9.1 8.9 - 10.3 mg/dL   Total Protein 7.5 6.5 - 8.1 g/dL   Albumin 4.0 3.5 - 5.0 g/dL   AST 19 15 - 41 U/L   ALT 11 0 - 44 U/L   Alkaline Phosphatase 72 38 - 126 U/L   Total Bilirubin 0.9 0.3 - 1.2 mg/dL   GFR, Estimated 55 (L) >60 mL/min    Comment: (NOTE) Calculated using the CKD-EPI Creatinine Equation (2021)  Anion gap 7 5 - 15    Comment: Performed at [facility], [address] [place], [place] [address]  CBC     Status: None   Collection Time: 11/03/20 10:50 AM  Result Value Ref Range   WBC 8.3 4.0 - 10.5 K/uL   RBC 4.90 3.87 - 5.11 MIL/uL   Hemoglobin 14.2 12.0 - 15.0 g/dL   HCT 42.8 36.0 - 46.0 %   MCV 87.3 80.0 - 100.0 fL   MCH 29.0 26.0 - 34.0 pg   MCHC 33.2 30.0 - 36.0 g/dL   RDW 14.7 11.5 - 15.5 %   Platelets 280 150 - 400 K/uL   nRBC 0.0 0.0 - 0.2 %    Comment: Performed at [facility], [address]  [place], [place] [address]  Type and screen     Status: None   Collection Time: 11/03/20 10:50 AM  Result Value Ref Range   ABO/RH(D) O POS    Antibody Screen NEG    Sample Expiration 11/17/2020,2359    Extend sample reason      NO TRANSFUSIONS OR PREGNANCY IN THE PAST 3 MONTHS Performed at [facility], [address] [place], [place] [address]   Urinalysis, Routine w reflex microscopic     Status: Abnormal   Collection Time: 11/03/20 10:50 AM  Result Value Ref Range   Color, Urine YELLOW (A) YELLOW   APPearance HAZY (A) CLEAR   Specific Gravity, Urine 1.005 1.005 - 1.030   pH 8.0 5.0 - 8.0   Glucose, UA NEGATIVE NEGATIVE mg/dL   Hgb urine dipstick NEGATIVE NEGATIVE   Bilirubin Urine NEGATIVE NEGATIVE   Ketones, ur NEGATIVE NEGATIVE mg/dL   Protein, ur NEGATIVE NEGATIVE mg/dL   Nitrite POSITIVE (A) NEGATIVE   Leukocytes,Ua LARGE (A) NEGATIVE   RBC / HPF 0-5 0 - 5 RBC/hpf   WBC, UA 21-50 0 - 5 WBC/hpf   Bacteria, UA FEW (A) NONE SEEN   Squamous Epithelial / LPF 0-5 0 - 5   WBC Clumps PRESENT     Comment: Performed at [facility], [address] [place], [place] [address]  Urine Culture     Status: Abnormal   Collection Time: 11/03/20  1:28 PM   Specimen: Urine, Random  Result Value Ref Range   Specimen Description      URINE, RANDOM Performed at [facility], [address] [place],  [address]    Special Requests      NONE Performed at [facility], [address], [place], [place] [address]    Culture >=100,000 COLONIES/mL CITROBACTER FREUNDII (A)    Report Status 11/05/2020 FINAL    Organism ID, Bacteria CITROBACTER FREUNDII (A)       Susceptibility   Citrobacter freundii - MIC*    CEFAZOLIN >=64 RESISTANT Resistant     CEFEPIME <=0.12 SENSITIVE Sensitive     CEFTRIAXONE 0.5 SENSITIVE Sensitive     CIPROFLOXACIN 0.5 SENSITIVE Sensitive     GENTAMICIN >=16 RESISTANT Resistant     IMIPENEM <=0.25 SENSITIVE Sensitive      NITROFURANTOIN <=16 SENSITIVE Sensitive     TRIMETH/SULFA >=320 RESISTANT Resistant     PIP/TAZO <=4 SENSITIVE Sensitive     * >=100,000 COLONIES/mL CITROBACTER FREUNDII  UA/M w/rflx Culture, Routine     Status: Abnormal (Preliminary result)   Collection Time: 01/11/21  4:08 PM   Specimen: Urine   Urine  Result Value Ref Range   Specific Gravity, UA 1.013  1.005 - 1.030   pH, UA 7.0 5.0 - 7.5   Color, UA Yellow Yellow   Appearance Ur Cloudy (A) Clear   Leukocytes,UA Negative Negative   Protein,UA Negative Negative/Trace   Glucose, UA Negative Negative   Ketones, UA Negative Negative   RBC, UA Negative Negative   Bilirubin, UA Negative Negative   Urobilinogen, Ur 0.2 0.2 - 1.0 mg/dL   Nitrite, UA Negative Negative   Microscopic Examination Comment     Comment: Microscopic follows if indicated.   Microscopic Examination See below:     Comment: Microscopic was indicated and was performed.   Urinalysis Reflex Comment     Comment: This specimen has reflexed to a Urine Culture.  Microscopic Examination     Status: Abnormal   Collection Time: 01/11/21  4:08 PM   Urine  Result Value Ref Range   WBC, UA 0-5 0 - 5 /hpf   RBC 0-2 0 - 2 /hpf   Epithelial Cells (non renal) 0-10 0 - 10 /hpf   Casts None seen None seen /lpf   Bacteria, UA Many (A) None seen/Few  Urine Culture, Reflex     Status: None (Preliminary result)   Collection Time: 01/11/21  4:08 PM   Urine  Result Value Ref Range   Urine Culture, Routine WILL FOLLOW         Assessment/Plan: 1. Encounter for general adult medical examination with abnormal findings CPE performed, routine labs will be done  2. Benign essential HTN Elevated in office due to patient not taking medication yet today.  Will take medication now and monitor closely.  Normally well controlled  3. Paroxysmal supraventricular tachycardia ([place]) Followed by cardiology  4. S/P hip replacement, right Recovering well from hip replacement 2 weeks  ago, followed by orthopedics  5. Atherosclerosis of aorta (HCC) Continue statin  6. Mixed hyperlipidemia Continue statin and will update labs  7. Dysuria - UA/M w/rflx Culture, Routine   General Counseling: Elverna verbalizes understanding of the findings of todays visit and agrees with plan of treatment. I have discussed any further diagnostic evaluation that may be needed or ordered today. We also reviewed her medications today. she has been encouraged to call the office with any questions or concerns that should arise related to todays visit.    Counseling: Hypertension Counseling:   The following hypertensive lifestyle modification were recommended and discussed:  1. Limiting alcohol intake to less than 1 oz/day of ethanol:(24 oz of beer or 8 oz of wine or 2 oz of 100-proof whiskey). 2. Take baby ASA 81 mg daily. 3. Importance of regular aerobic exercise and losing weight. 4. Reduce dietary saturated fat and cholesterol intake for overall cardiovascular health. 5. Maintaining adequate dietary potassium, calcium, and magnesium intake. 6. Regular monitoring of the blood pressure. 7. Reduce sodium intake to less than 100 mmol/day (less than 2.3 gm of sodium or less than 6 gm of sodium choride)     Orders Placed This Encounter  Procedures   Microscopic Examination   Urine Culture, Reflex   UA/M w/rflx Culture, Routine    No orders of the defined types were placed in this encounter.   This patient was seen by Drema Dallas, PA-C in collaboration with Dr. Clayborn Bigness as a part of collaborative care agreement.  Total time spent:35 Minutes  Time spent includes review of chart, medications, test results, and follow up plan with the patient.     Lavera Guise, MD  Internal Medicine

## 2021-01-12 ENCOUNTER — Telehealth: Payer: Self-pay | Admitting: Pharmacist

## 2021-01-12 NOTE — Progress Notes (Addendum)
Chronic Care Management Pharmacy Assistant   Name: Amanda Duke  MRN: [id number] DOB: [birth year]  Reason for Encounter: Disease [place].    Conditions to be addressed/monitored: HTN, CAD, GERD, IBS-C, Osteoporosis, HLD  Recent office visits:  01/11/21 Mylinda Latina, PA-C. For medicare Weakness. No medication changes.  Recent consult visits:  12/12/19 Cardiology Flossie Dibble, MD For coronary artery disease.  12/05/20 Darol Destine, Malena Catholic, MD  For follow-up. No medication changes.   Hospital visits:  None since   Medications: Outpatient Encounter Medications as of 01/12/2021  Medication Sig   acetaminophen (TYLENOL) 500 MG tablet Take 1,000 mg by mouth every 6 (six) hours as needed for moderate pain.   aspirin EC 81 MG tablet Take 81 mg by mouth 2 (two) times daily. Swallow whole.   carvedilol (COREG) 3.125 MG tablet Take 3.125 mg by mouth 2 (two) times daily with a meal.    chlorhexidine (PERIDEX) 0.12 % solution Use as directed 15 mLs in the mouth or throat 2 (two) times daily.   cholecalciferol (VITAMIN D) 1000 units tablet Take 1,000 Units by mouth daily.   clonazePAM (KLONOPIN) 0.5 MG tablet TAKE 1 TABLET BY MOUTH AT BEDTIME AS NEEDED INSOMNIA/ANXIETY (Patient taking differently: Take 0.5 mg by mouth at bedtime as needed. TAKE 1 TABLET BY MOUTH AT BEDTIME AS NEEDED INSOMNIA/ANXIETY)   diclofenac Sodium (VOLTAREN) 1 % GEL Apply 2 g topically 4 (four) times daily. (Patient taking differently: Apply 2 g topically 4 (four) times daily as needed (pain).)   diltiazem (CARDIZEM CD) 120 MG 24 hr capsule Take 120 mg by mouth daily.    ergocalciferol (DRISDOL) 1.25 MG (50000 UT) capsule Take 1 capsule (50,000 Units total) by mouth once a week.   ibuprofen (ADVIL) 600 MG tablet Take 1 tablet (600 mg total) by mouth 2 (two) times daily as needed. (Patient taking differently: Take 600 mg by mouth 2 (two) times daily as needed for moderate pain.)   icosapent Ethyl  (VASCEPA) 1 g capsule Take 2 g by mouth 2 (two) times daily.   linaclotide (LINZESS) 290 MCG CAPS capsule Take 1 capsule (290 mcg total) by mouth daily.   losartan (COZAAR) 100 MG tablet Take 100 mg by mouth daily.   methylPREDNISolone (MEDROL DOSEPAK) 4 MG TBPK tablet Take 4-24 mg by mouth See admin instructions. TAKE 6 TABLETS ON DAY 1 AS DIRECTED ON PACKAGE AND DECREASE BY 1 TAB EACH DAY FOR A TOTAL OF 6 DAYS   pantoprazole (PROTONIX) 40 MG tablet Take 40 mg by mouth in the morning.   rosuvastatin (CRESTOR) 20 MG tablet Take 1 tablet (20 mg total) by mouth daily.   rosuvastatin (CRESTOR) 40 MG tablet Take 40 mg by mouth at bedtime.   No facility-administered encounter medications on file as of 01/12/2021.   01/12/2021 Name: Amanda Duke MRN: [id number] DOB: 19-Oct-1940 Amanda Duke is a 80 y.o. year old female who is a primary care patient of Lavera Guise, MD.  Comprehensive medication review performed; Spoke to patient regarding cholesterol  Lipid Panel    Component Value Date/Time   CHOL 244 (H) 08/03/2019 1049   CHOL 202 (H) 05/15/2014 0122   TRIG 213 (H) 08/03/2019 1049   TRIG 238 (H) 05/15/2014 0122   HDL 45 08/03/2019 1049   HDL 36 (L) 05/15/2014 0122   LDLCALC 156 (H) 08/03/2019 1049   LDLCALC 118 (H) 05/15/2014 0122    10-year ASCVD risk score: The ASCVD Risk  score (Arnett DK, et al., 2019) failed to calculate for the following reasons:   The 2019 ASCVD risk score is only valid for ages 95 to 34  Current antihyperlipidemic regimen:  Rosuvastatin '40mg'$  daily Vascepa 1g daily  Previous antihyperlipidemic medications tried: Simvastatin   ASCVD risk enhancing conditions: age >43  What recent interventions/DTPs have been made by any provider to improve Cholesterol control since last CPP Visit: None.   Any recent hospitalizations or ED visits since last visit with CPP? Patient stated no.  What diet changes have been made to improve Cholesterol?  Patient stated not  much as changed from her diet. She was reminded of what a well rounded diet is.  What exercise is being done to improve Cholesterol?  Patient stated she is active but does not do any regular exercises daily.   Adherence Review: Does the patient have >5 day gap between last estimated fill dates? Per misc rpts, no.    Care Gaps:Not on the list.   Star Rating Drugs: Rosuvastatin 40 mg 11/14/20 90 DS, Losartan 100 mg 11/10/20 90 DS.  Patient stated she did not have any questions or concerns about her medications at this time.   Follow-Up:Pharmacist Review  Charlann Lange, [place] Pharmacist Assistant [phone]

## 2021-01-15 DIAGNOSIS — M1611 Unilateral primary osteoarthritis, right hip: Secondary | ICD-10-CM | POA: Diagnosis not present

## 2021-01-19 LAB — UA/M W/RFLX CULTURE, ROUTINE
Bilirubin, UA: NEGATIVE
Glucose, UA: NEGATIVE
Ketones, UA: NEGATIVE
Leukocytes,UA: NEGATIVE
Nitrite, UA: NEGATIVE
Protein,UA: NEGATIVE
RBC, UA: NEGATIVE
Specific Gravity, UA: 1.013 (ref 1.005–1.030)
Urobilinogen, Ur: 0.2 mg/dL (ref 0.2–1.0)
pH, UA: 7 (ref 5.0–7.5)

## 2021-01-19 LAB — URINE CULTURE, REFLEX

## 2021-01-19 LAB — MICROSCOPIC EXAMINATION: Casts: NONE SEEN /lpf

## 2021-01-23 ENCOUNTER — Other Ambulatory Visit: Payer: Self-pay | Admitting: Physician Assistant

## 2021-01-23 ENCOUNTER — Telehealth: Payer: Self-pay

## 2021-01-23 DIAGNOSIS — N3000 Acute cystitis without hematuria: Secondary | ICD-10-CM

## 2021-01-23 MED ORDER — CEPHALEXIN 500 MG PO CAPS
500.0000 mg | ORAL_CAPSULE | Freq: Two times a day (BID) | ORAL | 0 refills | Status: DC
Start: 2021-01-23 — End: 2021-03-05

## 2021-01-23 NOTE — Telephone Encounter (Signed)
Spoke to pt and informed her that she has a UTI.  Per lauren: Please let pt know she has a uti and unfortunately the bacteria responds best to ABX that she has allergies listed for. We can try keflex since she has tolerated it before, but it may or may not adequately treat. Macrobid does treat it but it says she has diarrhea with that med... check to see if this is still the case and whether she would like me to just send the keflex and f/u with her OBGYN who had previously been treating her or if she wants to retry macrobid despite past reaction  Pt stated that she will try the Keflex and will schedule an appt with her OB/GYN

## 2021-01-24 DIAGNOSIS — M1611 Unilateral primary osteoarthritis, right hip: Secondary | ICD-10-CM | POA: Diagnosis not present

## 2021-01-26 DIAGNOSIS — I10 Essential (primary) hypertension: Secondary | ICD-10-CM | POA: Diagnosis not present

## 2021-01-26 DIAGNOSIS — E782 Mixed hyperlipidemia: Secondary | ICD-10-CM | POA: Diagnosis not present

## 2021-01-29 ENCOUNTER — Other Ambulatory Visit
Admission: RE | Admit: 2021-01-29 | Discharge: 2021-01-29 | Disposition: A | Discharge status: Home / Self Care | Payer: Medicare Other | Attending: Physician Assistant | Admitting: Physician Assistant

## 2021-01-29 DIAGNOSIS — E782 Mixed hyperlipidemia: Secondary | ICD-10-CM | POA: Diagnosis not present

## 2021-01-29 DIAGNOSIS — R6889 Other general symptoms and signs: Secondary | ICD-10-CM | POA: Insufficient documentation

## 2021-01-29 DIAGNOSIS — E038 Other specified hypothyroidism: Secondary | ICD-10-CM | POA: Insufficient documentation

## 2021-01-29 DIAGNOSIS — R5383 Other fatigue: Secondary | ICD-10-CM | POA: Insufficient documentation

## 2021-01-29 LAB — COMPREHENSIVE METABOLIC PANEL
ALT: 10 U/L (ref 0–44)
AST: 20 U/L (ref 15–41)
Albumin: 4 g/dL (ref 3.5–5.0)
Alkaline Phosphatase: 89 U/L (ref 38–126)
Anion gap: 9 (ref 5–15)
BUN: 12 mg/dL (ref 8–23)
CO2: 26 mmol/L (ref 22–32)
Calcium: 9.4 mg/dL (ref 8.9–10.3)
Chloride: 103 mmol/L (ref 98–111)
Creatinine, Ser: 1.11 mg/dL — ABNORMAL HIGH (ref 0.44–1.00)
GFR, Estimated: 50 mL/min — ABNORMAL LOW (ref >60)
Glucose, Bld: 116 mg/dL — ABNORMAL HIGH (ref 70–99)
Potassium: 3.9 mmol/L (ref 3.5–5.1)
Sodium: 138 mmol/L (ref 135–145)
Total Bilirubin: 0.9 mg/dL (ref 0.3–1.2)
Total Protein: 8 g/dL (ref 6.5–8.1)

## 2021-01-29 LAB — LIPID PANEL
Cholesterol: 221 mg/dL — ABNORMAL HIGH (ref 0–200)
HDL: 48 mg/dL (ref >40)
LDL Cholesterol: 142 mg/dL — ABNORMAL HIGH (ref 0–99)
Total CHOL/HDL Ratio: 4.6 RATIO
Triglycerides: 153 mg/dL — ABNORMAL HIGH (ref <150)
VLDL: 31 mg/dL (ref 0–40)

## 2021-01-29 LAB — CBC WITH DIFFERENTIAL/PLATELET
Abs Immature Granulocytes: 0.03 10*3/uL (ref 0.00–0.07)
Basophils Absolute: 0.1 10*3/uL (ref 0.0–0.1)
Basophils Relative: 2 %
Eosinophils Absolute: 0.3 10*3/uL (ref 0.0–0.5)
Eosinophils Relative: 4 %
HCT: 39.3 % (ref 36.0–46.0)
Hemoglobin: 12.6 g/dL (ref 12.0–15.0)
Immature Granulocytes: 0 %
Lymphocytes Relative: 34 %
Lymphs Abs: 2.4 10*3/uL (ref 0.7–4.0)
MCH: 28.1 pg (ref 26.0–34.0)
MCHC: 32.1 g/dL (ref 30.0–36.0)
MCV: 87.5 fL (ref 80.0–100.0)
Monocytes Absolute: 0.8 10*3/uL (ref 0.1–1.0)
Monocytes Relative: 12 %
Neutro Abs: 3.4 10*3/uL (ref 1.7–7.7)
Neutrophils Relative %: 48 %
Platelets: 346 10*3/uL (ref 150–400)
RBC: 4.49 MIL/uL (ref 3.87–5.11)
RDW: 14.7 % (ref 11.5–15.5)
WBC: 7 10*3/uL (ref 4.0–10.5)
nRBC: 0 % (ref 0.0–0.2)

## 2021-01-29 LAB — TSH: TSH: 2.158 u[IU]/mL (ref 0.350–4.500)

## 2021-01-29 LAB — T4, FREE: Free T4: 0.91 ng/dL (ref 0.61–1.12)

## 2021-01-29 LAB — VITAMIN D 25 HYDROXY (VIT D DEFICIENCY, FRACTURES): Vit D, 25-Hydroxy: 31.1 ng/mL (ref 30–100)

## 2021-01-31 ENCOUNTER — Other Ambulatory Visit: Payer: Self-pay | Admitting: Physician Assistant

## 2021-01-31 DIAGNOSIS — Z09 Encounter for follow-up examination after completed treatment for conditions other than malignant neoplasm: Secondary | ICD-10-CM

## 2021-02-01 ENCOUNTER — Other Ambulatory Visit: Payer: Self-pay

## 2021-02-01 ENCOUNTER — Ambulatory Visit
Admission: RE | Admit: 2021-02-01 | Discharge: 2021-02-01 | Disposition: A | Discharge status: Home / Self Care | Payer: Medicare Other | Source: Ambulatory Visit | Attending: Physician Assistant | Admitting: Physician Assistant

## 2021-02-01 DIAGNOSIS — K4020 Bilateral inguinal hernia, without obstruction or gangrene, not specified as recurrent: Secondary | ICD-10-CM | POA: Diagnosis not present

## 2021-02-01 DIAGNOSIS — M25551 Pain in right hip: Secondary | ICD-10-CM | POA: Diagnosis not present

## 2021-02-01 DIAGNOSIS — Z09 Encounter for follow-up examination after completed treatment for conditions other than malignant neoplasm: Secondary | ICD-10-CM | POA: Insufficient documentation

## 2021-02-01 DIAGNOSIS — S32311A Displaced avulsion fracture of right ilium, initial encounter for closed fracture: Secondary | ICD-10-CM | POA: Diagnosis not present

## 2021-02-01 DIAGNOSIS — R102 Pelvic and perineal pain: Secondary | ICD-10-CM | POA: Insufficient documentation

## 2021-02-01 DIAGNOSIS — G8918 Other acute postprocedural pain: Secondary | ICD-10-CM | POA: Insufficient documentation

## 2021-02-01 DIAGNOSIS — K429 Umbilical hernia without obstruction or gangrene: Secondary | ICD-10-CM | POA: Diagnosis not present

## 2021-02-01 DIAGNOSIS — M1612 Unilateral primary osteoarthritis, left hip: Secondary | ICD-10-CM | POA: Diagnosis not present

## 2021-02-07 ENCOUNTER — Telehealth: Payer: Self-pay

## 2021-02-07 NOTE — Telephone Encounter (Signed)
Pt advised for labs

## 2021-02-09 ENCOUNTER — Other Ambulatory Visit: Payer: Self-pay | Admitting: Physician Assistant

## 2021-02-09 DIAGNOSIS — F411 Generalized anxiety disorder: Secondary | ICD-10-CM

## 2021-02-13 DIAGNOSIS — M1611 Unilateral primary osteoarthritis, right hip: Secondary | ICD-10-CM | POA: Diagnosis not present

## 2021-02-15 DIAGNOSIS — M1611 Unilateral primary osteoarthritis, right hip: Secondary | ICD-10-CM | POA: Diagnosis not present

## 2021-02-20 DIAGNOSIS — Z1231 Encounter for screening mammogram for malignant neoplasm of breast: Secondary | ICD-10-CM | POA: Diagnosis not present

## 2021-02-22 DIAGNOSIS — M1611 Unilateral primary osteoarthritis, right hip: Secondary | ICD-10-CM | POA: Diagnosis not present

## 2021-02-26 DIAGNOSIS — M1611 Unilateral primary osteoarthritis, right hip: Secondary | ICD-10-CM | POA: Diagnosis not present

## 2021-03-05 ENCOUNTER — Other Ambulatory Visit: Payer: Self-pay

## 2021-03-05 ENCOUNTER — Encounter: Payer: Self-pay | Admitting: Internal Medicine

## 2021-03-05 ENCOUNTER — Telehealth (INDEPENDENT_AMBULATORY_CARE_PROVIDER_SITE_OTHER): Payer: Medicare Other | Admitting: Internal Medicine

## 2021-03-05 VITALS — Resp 16 | Ht 64.5 in | Wt 178.0 lb

## 2021-03-05 DIAGNOSIS — J3089 Other allergic rhinitis: Secondary | ICD-10-CM | POA: Diagnosis not present

## 2021-03-05 DIAGNOSIS — J0140 Acute pansinusitis, unspecified: Secondary | ICD-10-CM | POA: Diagnosis not present

## 2021-03-05 NOTE — Progress Notes (Signed)
[facility] [place], [place] [address]  Internal MEDICINE  Telephone Visit  Patient Name: Amanda Duke  [id number]  [id number]  Date of Service: 03/05/2021  I connected with the patient at 1100am by telephone and verified the patients identity using two identifiers.   I discussed the limitations, risks, security and privacy concerns of performing an evaluation and management service by telephone and the availability of in person appointments. I also discussed with the patient that there may be a patient responsible charge related to the service.  The patient expressed understanding and agrees to proceed.    Chief Complaint  Patient presents with   Telephone Assessment    [phone] pt can only do phone call   Sinusitis    Sinus drainage, head pressure, cough with drainage, head stuffy x 3 days.  No covid test done    HPI  Patient is connected through virtual visit with complaints of sinus drainage, pressure in her head postnasal drip and cough, this has been going on for about 3 days now patient is taking over-the-counter medications however she feels like is not helping her.  She has not had any COVID testing no one else is was sick with COVID infection  Current Medication: Outpatient Encounter Medications as of 03/05/2021  Medication Sig   acetaminophen (TYLENOL) 500 MG tablet Take 1,000 mg by mouth every 6 (six) hours as needed for moderate pain.   aspirin EC 81 MG tablet Take 81 mg by mouth 2 (two) times daily. Swallow whole.   chlorhexidine (PERIDEX) 0.12 % solution Use as directed 15 mLs in the mouth or throat 2 (two) times daily.   cholecalciferol (VITAMIN D) 1000 units tablet Take 1,000 Units by mouth daily.   clonazePAM (KLONOPIN) 0.5 MG tablet TAKE 1 TABLET BY MOUTH AT BEDTIME AS NEEDED INSOMNIA/ANXIETY   diclofenac Sodium (VOLTAREN) 1 % GEL Apply 2 g topically 4 (four) times daily. (Patient taking differently: Apply 2 g topically 4 (four)  times daily as needed (pain).)   ergocalciferol (DRISDOL) 1.25 MG (50000 UT) capsule Take 1 capsule (50,000 Units total) by mouth once a week.   ibuprofen (ADVIL) 600 MG tablet Take 1 tablet (600 mg total) by mouth 2 (two) times daily as needed. (Patient taking differently: Take 600 mg by mouth 2 (two) times daily as needed for moderate pain.)   icosapent Ethyl (VASCEPA) 1 g capsule Take 2 g by mouth 2 (two) times daily.   linaclotide (LINZESS) 290 MCG CAPS capsule Take 1 capsule (290 mcg total) by mouth daily.   losartan (COZAAR) 100 MG tablet Take 100 mg by mouth daily.   pantoprazole (PROTONIX) 40 MG tablet Take 40 mg by mouth in the morning.   rosuvastatin (CRESTOR) 40 MG tablet Take 40 mg by mouth at bedtime.   [DISCONTINUED] cephALEXin (KEFLEX) 500 MG capsule Take 1 capsule (500 mg total) by mouth 2 (two) times daily.   [DISCONTINUED] methylPREDNISolone (MEDROL DOSEPAK) 4 MG TBPK tablet Take 4-24 mg by mouth See admin instructions. TAKE 6 TABLETS ON DAY 1 AS DIRECTED ON PACKAGE AND DECREASE BY 1 TAB EACH DAY FOR A TOTAL OF 6 DAYS   [DISCONTINUED] rosuvastatin (CRESTOR) 20 MG tablet Take 1 tablet (20 mg total) by mouth daily.   carvedilol (COREG) 3.125 MG tablet Take 3.125 mg by mouth 2 (two) times daily with a meal.    diltiazem (CARDIZEM CD) 120 MG 24 hr capsule Take 120 mg by mouth daily.    No facility-administered  encounter medications on file as of 03/05/2021.    Surgical History: Past Surgical History:  Procedure Laterality Date   [place]   COLONOSCOPY WITH PROPOFOL N/A 12/12/2016   Procedure: COLONOSCOPY WITH PROPOFOL;  Surgeon: Lollie Sails, MD;  Location: [facility] ENDOSCOPY;  Service: Endoscopy;  Laterality: N/A;   COLONOSCOPY WITH PROPOFOL N/A 07/14/2019   Procedure: COLONOSCOPY WITH PROPOFOL;  Surgeon: Toledo, Benay Pike, MD;  Location: [facility] ENDOSCOPY;  Service: Gastroenterology;  Laterality: N/A;    ESOPHAGOGASTRODUODENOSCOPY (EGD) WITH PROPOFOL N/A 12/12/2016   Procedure: ESOPHAGOGASTRODUODENOSCOPY (EGD) WITH PROPOFOL;  Surgeon: Lollie Sails, MD;  Location: [facility] ENDOSCOPY;  Service: Endoscopy;  Laterality: N/A;   ESOPHAGOGASTRODUODENOSCOPY (EGD) WITH PROPOFOL N/A 07/14/2019   Procedure: ESOPHAGOGASTRODUODENOSCOPY (EGD) WITH PROPOFOL;  Surgeon: Toledo, Benay Pike, MD;  Location: [facility] ENDOSCOPY;  Service: Gastroenterology;  Laterality: N/A;   LAPAROSCOPIC UNILATERAL SALPINGO OOPHERECTOMY  1985   right   ORIF ANKLE FRACTURE Right    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    TONSILLECTOMY  1948   TUBAL LIGATION  1971   VAGINAL HYSTERECTOMY  1985   d/t heavy bleeding    Medical History: Past Medical History:  Diagnosis Date   Anxiety    Coronary artery disease    Gastritis    GERD (gastroesophageal reflux disease)    History of colonic polyps    Hyperlipemia    Hypertension    Irregular heart beat    Pneumonia    Pre-diabetes    Prediabetes    Redundant colon    Renal cyst    bilateral    Family History: Family History  Problem Relation Age of Onset   Heart failure Mother    Stroke Mother    Breast cancer Mother        dx at age 82 yo   Diabetes Mother    Osteoporosis Mother    Emphysema Father    Ovarian cancer Sister        dx at age 51 yo   Asthma Sister    Diabetes Daughter    Heart disease Daughter    COPD Daughter     Social History   Socioeconomic History   Marital status: Widowed    Spouse name: Not on file   Number of children: Not on file   Years of education: Not on file   Highest education level: Not on file  Occupational History   Not on file  Tobacco Use   Smoking status: Former   Smokeless tobacco: Never  Vaping Use   Vaping Use: Never used  Substance and Sexual Activity   Alcohol use: No    Alcohol/week: 0.0 standard drinks   Drug use: Never   Sexual activity: Not Currently  Other Topics Concern   Not on file   Social History Narrative   Not on file   Social Determinants of Health   Financial Resource Strain: Low Risk    Difficulty of Paying Living Expenses: Not very hard  Food Insecurity: Not on file  Transportation Needs: Not on file  Physical Activity: Not on file  Stress: Not on file  Social Connections: Not on file  Intimate Partner Violence: Not on file      Review of Systems  Constitutional:  Negative for fatigue and fever.  HENT:  Positive for sinus pressure. Negative for congestion, mouth sores and postnasal drip.   Respiratory:  Positive for cough.  Cardiovascular:  Negative for chest pain.  Genitourinary:  Negative for flank pain.  Psychiatric/Behavioral: Negative.     Vital Signs: Resp 16   Ht 5' 4.5" (1.638 m)   Wt 178 lb (80.7 kg)   BMI 30.08 kg/m    Observation/Objective: No apparent distress able to talk without taking breaks during conversation    Assessment/Plan: 1. Acute non-recurrent pansinusitis Patient has viral symptoms instructed to monitor for next 24 to 36 hours and give the office a call back if has worsening symptoms or develops fever or chills..  Patient is instructed to have a COVID testing done if her symptoms get worse  2. Non-seasonal allergic rhinitis due to other allergic trigger Patient is already on over-the-counter antihistamine she will continue to take as needed  General Counseling: Verda verbalizes understanding of the findings of today's phone visit and agrees with plan of treatment. I have discussed any further diagnostic evaluation that may be needed or ordered today. We also reviewed her medications today. she has been encouraged to call the office with any questions or concerns that should arise related to todays visit.     Time spent:12 Minutes  Dr Lavera Guise Internal medicine

## 2021-03-06 ENCOUNTER — Telehealth: Payer: Self-pay

## 2021-03-06 ENCOUNTER — Other Ambulatory Visit: Payer: Self-pay

## 2021-03-06 MED ORDER — AZITHROMYCIN 250 MG PO TABS: ORAL_TABLET | ORAL | 0 refills | Status: DC

## 2021-03-06 NOTE — Telephone Encounter (Signed)
Pt called that she is not feeling better still congested ,fever and chills no body aches as per dr Humphrey Rolls send zpak for 5 days

## 2021-03-20 DIAGNOSIS — Z96641 Presence of right artificial hip joint: Secondary | ICD-10-CM | POA: Diagnosis not present

## 2021-03-20 NOTE — Progress Notes (Deleted)
Chronic Care Management Pharmacy Note  03/20/2021 Name:  Amanda Duke MRN:  [id number] DOB:  22-Feb-1941  Summary:  Recommendations/Changes made from today's visit:  Plan:    Subjective: Amanda Duke is an 80 y.o. year old female who is a primary patient of Humphrey Rolls Timoteo Gaul, MD.  The CCM team was consulted for assistance with disease management and care coordination needs.    Engaged with patient via telephone for follow up visit in response to provider referral for pharmacy case management and/or care coordination services.   Consent to Services:  The patient was given the following information about Chronic Care Management services today, agreed to services, and gave verbal consent: 1. CCM service includes personalized support from designated clinical staff supervised by the primary care provider, including individualized plan of care and coordination with other care providers 2. 24/7 contact phone numbers for assistance for urgent and routine care needs. 3. Service will only be billed when office clinical staff spend 20 minutes or more in a month to coordinate care. 4. Only one practitioner may furnish and bill the service in a calendar month. 5.The patient may stop CCM services at any time (effective at the end of the month) by phone call to the office staff. 6. The patient will be responsible for cost sharing (co-pay) of up to 20% of the service fee (after annual deductible is met). Patient agreed to services and consent obtained.  Patient Care Team: Lavera Guise, MD as PCP - General (Internal Medicine) Alena Bills, [facility] as Pharmacist (Pharmacist)  Recent office visits:  03/05/21 Clayborn Bigness, PCP: Patient prescribed z-pak for sinus congestion 01/11/21 McDonough, Si Gaul, PA-C. For medicare Weakness. No medication changes.   Recent consult visits:   02/20/21: Radiology, for mammogram  Hospital visits:  None in last 6 months   Medication History: Rosuvastatin 40 mg  90 DS 11/14/20 Losartan 100 mg 90 DS 11/10/20   Objective:  Lab Results  Component Value Date   CREATININE 1.11 (H) 01/29/2021   BUN 12 01/29/2021   GFRNONAA 50 (L) 01/29/2021   GFRAA 55 (L) 08/03/2019   NA 138 01/29/2021   K 3.9 01/29/2021   CALCIUM 9.4 01/29/2021   CO2 26 01/29/2021   GLUCOSE 116 (H) 01/29/2021    Lab Results  Component Value Date/Time   HGBA1C 5.7 (H) 02/23/2016 10:46 AM    Last diabetic Eye exam: No results found for: HMDIABEYEEXA  Last diabetic Foot exam: No results found for: HMDIABFOOTEX   Lab Results  Component Value Date   CHOL 221 (H) 01/29/2021   HDL 48 01/29/2021   LDLCALC 142 (H) 01/29/2021   TRIG 153 (H) 01/29/2021   CHOLHDL 4.6 01/29/2021    Hepatic Function Latest Ref Rng & Units 01/29/2021 11/03/2020 08/03/2019  Total Protein 6.5 - 8.1 g/dL 8.0 7.5 7.4  Albumin 3.5 - 5.0 g/dL 4.0 4.0 4.1  AST 15 - 41 U/L _0 ALT 0 - 44 U/L _1 Alk Phosphatase 38 - 126 U/L 89 72 81  Total Bilirubin 0.3 - 1.2 mg/dL 0.9 0.9 0.7    Lab Results  Component Value Date/Time   TSH 2.158 01/29/2021 10:15 AM   TSH 2.355 08/03/2019 10:49 AM   FREET4 0.91 01/29/2021 10:15 AM   FREET4 0.76 08/03/2019 10:49 AM    CBC Latest Ref Rng & Units 01/29/2021 11/03/2020 08/03/2019  WBC 4.0 - 10.5 K/uL 7.0 8.3 6.8  Hemoglobin 12.0 - 15.0 g/dL 12.6 14.2  12.9  Hematocrit 36.0 - 46.0 % 39.3 42.8 40.2  Platelets 150 - 400 K/uL 346 280 299    Lab Results  Component Value Date/Time   VD25OH 31.10 01/29/2021 10:15 AM   VD25OH 16.17 (L) 08/03/2019 10:49 AM    Clinical ASCVD: Yes  The ASCVD Risk score (Arnett DK, et al., 2019) failed to calculate for the following reasons:   The 2019 ASCVD risk score is only valid for ages 46 to 76    Depression screen PHQ 2/9 01/11/2021 10/06/2020 01/04/2020  Decreased Interest 0 0 0  Down, Depressed, Hopeless 0 0 0  PHQ - 2 Score 0 0 0     Social History   Tobacco Use  Smoking Status Former  Smokeless Tobacco Never   BP  Readings from Last 3 Encounters:  01/11/21 (!) 162/80  10/13/20 (!) 157/89  10/06/20 132/84   Pulse Readings from Last 3 Encounters:  01/11/21 66  10/13/20 68  10/06/20 78   Wt Readings from Last 3 Encounters:  03/05/21 178 lb (80.7 kg)  01/11/21 181 lb (82.1 kg)  11/01/20 180 lb (81.6 kg)   BMI Readings from Last 3 Encounters:  03/05/21 30.08 kg/m  01/11/21 30.59 kg/m  11/01/20 30.90 kg/m    Assessment/Interventions: Review of patient past medical history, allergies, medications, health status, including review of consultants reports, laboratory and other test data, was performed as part of comprehensive evaluation and provision of chronic care management services.   SDOH:  (Social Determinants of Health) assessments and interventions performed: Yes  Financial Resource Strain: Low Risk    Difficulty of Paying Living Expenses: Not very hard    SDOH Screenings   Alcohol Screen: Low Risk    Last Alcohol Screening Score (AUDIT): 0  Depression (PHQ2-9): Low Risk    PHQ-2 Score: 0  Financial Resource Strain: Low Risk    Difficulty of Paying Living Expenses: Not very hard  Food Insecurity: Not on file  Housing: Not on file  Physical Activity: Not on file  Social Connections: Not on file  Stress: Not on file  Tobacco Use: Medium Risk   Smoking Tobacco Use: Former   Smokeless Tobacco Use: Never   Passive Exposure: Not on file  Transportation Needs: Not on file    CCM Care Plan  Allergies  Allergen Reactions   Amoxicillin Hives    Patient tolerated Ceftriaxone in ED on 01/10/19.    Clindamycin Diarrhea   Gemfibrozil Other (See Comments)    muscle ache   Levofloxacin Other (See Comments)    Muscle ache for several months   Metoprolol Other (See Comments)    "Lowers blood pressure extremely low"   Simvastatin Other (See Comments)    muscle ache   Macrobid [Nitrofurantoin Macrocrystal] Diarrhea   Niacin And Related Rash   Nystatin-Triamcinolone Rash   Sulfa  Antibiotics Rash    Family history of medication allergy    Medications Reviewed Today     Reviewed by Edd Arbour, [place] (Certified Medical Assistant) on 03/05/21 at 62  Med List Status: <None>   Medication Order Taking? Sig Documenting Provider Last Dose Status Informant  acetaminophen (TYLENOL) 500 MG tablet [id number]  Take 1,000 mg by mouth every 6 (six) hours as needed for moderate pain. [provider]  Active Self  aspirin EC 81 MG tablet [id number]  Take 81 mg by mouth 2 (two) times daily. Swallow whole. [provider]  Active Self  carvedilol (COREG) 3.125 MG tablet [id number]  Take 3.125 mg  by mouth 2 (two) times daily with a meal.  [provider]  Expired 11/01/20 2359 Self  cephALEXin (KEFLEX) 500 MG capsule [id number]  Take 1 capsule (500 mg total) by mouth 2 (two) times daily. McDonough, Si Gaul, PA-C  Active   chlorhexidine (PERIDEX) 0.12 % solution [id number]  Use as directed 15 mLs in the mouth or throat 2 (two) times daily. Ronnell Freshwater, NP  Active Self  cholecalciferol (VITAMIN D) 1000 units tablet [id number]  Take 1,000 Units by mouth daily. [provider]  Active Self  clonazePAM (KLONOPIN) 0.5 MG tablet [id number]  TAKE 1 TABLET BY MOUTH AT BEDTIME AS NEEDED INSOMNIA/ANXIETY McDonough, Lauren K, PA-C  Active   diclofenac Sodium (VOLTAREN) 1 % GEL [id number]  Apply 2 g topically 4 (four) times daily.  Patient taking differently: Apply 2 g topically 4 (four) times daily as needed (pain).   McDonough, Si Gaul, PA-C  Active   diltiazem (CARDIZEM CD) 120 MG 24 hr capsule [id number]  Take 120 mg by mouth daily.  [provider]  Expired 10/27/20 2359 Self  ergocalciferol (DRISDOL) 1.25 MG (50000 UT) capsule [id number]  Take 1 capsule (50,000 Units total) by mouth once a week. Ronnell Freshwater, NP  Active Self  ibuprofen (ADVIL) 600 MG tablet [id number]  Take 1 tablet (600 mg total) by mouth 2 (two) times daily as needed.  Patient  taking differently: Take 600 mg by mouth 2 (two) times daily as needed for moderate pain.   Ronnell Freshwater, NP  Active   icosapent Ethyl (VASCEPA) 1 g capsule [id number]  Take 2 g by mouth 2 (two) times daily. [provider]  Active Self  linaclotide Rolan Lipa) 290 MCG CAPS capsule [id number]  Take 1 capsule (290 mcg total) by mouth daily. Ronnell Freshwater, NP  Active Self  losartan (COZAAR) 100 MG tablet [id number]  Take 100 mg by mouth daily. [provider]  Active Self  methylPREDNISolone (MEDROL DOSEPAK) 4 MG TBPK tablet [id number]  Take 4-24 mg by mouth See admin instructions. TAKE 6 TABLETS ON DAY 1 AS DIRECTED ON PACKAGE AND DECREASE BY 1 TAB EACH DAY FOR A TOTAL OF 6 DAYS [provider]  Active   pantoprazole (PROTONIX) 40 MG tablet [id number]  Take 40 mg by mouth in the morning. [provider]  Active Self  rosuvastatin (CRESTOR) 20 MG tablet [id number]  Take 1 tablet (20 mg total) by mouth daily. Ronnell Freshwater, NP  Active Self  rosuvastatin (CRESTOR) 40 MG tablet [id number]  Take 40 mg by mouth at bedtime. [provider]  Active Self            Patient Active Problem List   Diagnosis Date Noted   Groin strain 03/25/2020   Encounter for general adult medical examination with abnormal findings 01/19/2020   Encounter for screening mammogram for malignant neoplasm of breast 01/19/2020   Infection of tooth socket 01/18/2020   Atherosclerosis of aorta ([place]) 01/12/2020   Left hip pain 06/27/2019   Osteopenia of multiple sites 04/06/2019   Sepsis ([place]) 01/10/2019   Nausea 08/21/2018   Cough 04/16/2018   Screening for osteoporosis 12/24/2017   Generalized anxiety disorder 12/24/2017   Need for shingles vaccine 12/24/2017   Urinary tract infection without hematuria 09/12/2017   Acute sinusitis, unspecified 04/24/2017   Noninfective gastroenteritis and colitis, unspecified 04/24/2017   Solitary pulmonary nodule 04/24/2017   Acute  pharyngitis, unspecified 04/24/2017   Chronic kidney disease, 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/2017  Palpitations 04/24/2017   Primary generalized (osteo)arthritis 04/24/2017   Vitamin D deficiency 04/24/2017   Impaired fasting glucose 04/24/2017   Dysuria 04/24/2017   Insomnia, unspecified 04/24/2017   Age-related osteoporosis without current pathological fracture 04/24/2017   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   Paroxysmal supraventricular tachycardia ([place]) 05/23/2015   Family history of breast cancer in first degree relative 02/02/2015   Family history of ovarian cancer 02/02/2015   Irritable bowel syndrome with constipation 02/02/2015   Anxiety 02/02/2015   Osteoporosis 02/02/2015   Benign essential HTN 12/16/2014   Mixed hyperlipidemia 05/19/2014   Acid reflux 05/19/2014   Arteriosclerosis of coronary artery 05/19/2014   Chest pain 05/19/2014   Gastroesophageal reflux disease 05/19/2014   Coronary artery disease 05/19/2014   Cystocele, midline 07/01/2013   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   Urge incontinence 63/84/5364   Renal colic 68/06/2120   Symptoms involving urinary system 11/20/2012   Incomplete bladder emptying 11/20/2012   Frank hematuria 11/20/2012   Bladder infection, chronic 11/20/2012    Immunization History  Administered Date(s) Administered   Fluad Quad(high Dose 65+) 01/12/2019   Influenza, High Dose Seasonal PF 03/07/2017, 12/30/2017   Influenza, Seasonal, Injecte, Preservative Fre 03/01/2005   Influenza-Unspecified 12/30/2017, 02/16/2020   PFIZER(Purple Top)SARS-COV-2 Vaccination 08/25/2019   Pneumococcal Conjugate-13 03/07/2017   Pneumococcal Polysaccharide-23 12/27/2004, 07/22/2018    Conditions to be addressed/monitored:  HTN, CAD, GERD, IBS-C, Osteoporosis, HLD  There are no care plans that you recently modified to display for this patient.    Medication Assistance: None required.  Patient  affirms current coverage meets needs.  Compliance/Adherence/Medication fill history: Care Gaps: Zoster Vaccines Flu vaccine Tetanus Vaccine  Star-Rating Drugs: Rosuvastatin 40 mg 90 DS 11/14/20 Losartan 100 mg 90 DS 11/10/20  Patient's preferred pharmacy is:  [id number] [place], [place] [address] [id number]: [phone]: [phone] Uses pill box? No - takes out of bag Pt endorses 100% compliance  We discussed: Benefits of medication synchronization, packaging and delivery as well as enhanced pharmacist oversight with [employer]. Patient decided to: Continue current medication management strategy  Care Plan and Follow Up Patient Decision:  Patient agrees to Care Plan and Follow-up.  Plan: The care management team will reach out to the patient again over the next 120 days.  Current Barriers:  {pharmacybarriers:24917}  Pharmacist Clinical Goal(s):  Patient will {PHARMACYGOALCHOICES:24921} through collaboration with PharmD and provider.   Interventions: 1:1 collaboration with KLavera Guise MD regarding development and update of comprehensive plan of care as evidenced by provider attestation and co-signature Inter-disciplinary care team collaboration (see longitudinal plan of care) Comprehensive medication review performed; medication list updated in electronic medical record  Hypertension (BP goal <140/90) -Uncontrolled -Current treatment: Carvedilol 3.1214mtwice daily Diltiazem CD 12097mnce daily Losartan 100m59mily -Medications previously tried: None noted  -Current home readings: *** -Current dietary habits: *** -Current exercise habits: *** -{ACTIONS;DENIES/REPORTS:21021675::"Denies"} hypotensive/hypertensive symptoms -Educated on {CCM BP Counseling:25124} -Counseled to monitor BP at home ***, document, and provide log at future appointments -{CCMPHARMDINTERVENTION:25122}  Hyperlipidemia: (LDL goal <  100) -Uncontrolled -Current treatment: Rosuvastatin 40mg76mly Vascepa 2g twice daily -Medications previously tried: Simvastatin  -Current dietary patterns: *** -Current exercise habits: *** -Educated on {CCM HLD Counseling:25126} -{CCMPHARMDINTERVENTION:25122}  Osteoporosis /Vit D Deficiency (Goal Reduce fracture risk) -Not ideally controlled -Last DEXA Scan: 01/2019   T-Score femoral neck: -2.0  10-year probability of major osteoporotic fracture:  28%  10-year probability of hip fracture: 9% -Patient is a candidate for pharmacologic treatment due to T-Score -1.0 to -2.5 and 10-year risk of major osteoporotic fracture > 20% -Current treatment  Vitamin D 1000 units daily -Medications previously tried: None noted  -{Osteoporosis Counseling:23892} -{CCMPHARMDINTERVENTION:25122}  Patient Goals/Self-Care Activities Patient will:  - {pharmacypatientgoals:24919}  Follow Up Plan: {CM FOLLOW UP YWVX:42767}

## 2021-03-21 ENCOUNTER — Telehealth: Payer: Self-pay

## 2021-03-27 NOTE — Progress Notes (Signed)
Chronic Care Management Pharmacy Note  03/28/2021 Name:  Amanda Duke MRN:  [id number] DOB:  06-Jul-1940  Summary: Patient reports blood pressures ranging in 140s/90s at home, which she takes once weekly Patient wants a new DEXA Scan done on bones, will consult with PCP about providing a referral before next office visit  Recommendations/Changes made from today's visit: Continue current medication therapy Begin taking Calcium $RemoveBefore'600mg'xWDmKudRexzZc$  twice daily with meals Take blood pressure twice weekly and document  Plan: F/U 08/22/20 with pharmacist   Subjective: Amanda Duke is an 80 y.o. year old female who is a primary patient of Humphrey Rolls, Timoteo Gaul, MD.  The CCM team was consulted for assistance with disease management and care coordination needs.    Engaged with patient via telephone for follow up visit in response to provider referral for pharmacy case management and/or care coordination services.   Consent to Services:  The patient was given the following information about Chronic Care Management services today, agreed to services, and gave verbal consent: 1. CCM service includes personalized support from designated clinical staff supervised by the primary care provider, including individualized plan of care and coordination with other care providers 2. 24/7 contact phone numbers for assistance for urgent and routine care needs. 3. Service will only be billed when office clinical staff spend 20 minutes or more in a month to coordinate care. 4. Only one practitioner may furnish and bill the service in a calendar month. 5.The patient may stop CCM services at any time (effective at the end of the month) by phone call to the office staff. 6. The patient will be responsible for cost sharing (co-pay) of up to 20% of the service fee (after annual deductible is met). Patient agreed to services and consent obtained.  Patient Care Team: Lavera Guise, MD as PCP - General (Internal Medicine) Alena Bills, [facility] as Pharmacist (Pharmacist)  Recent office visits:  03/05/21 Clayborn Bigness, PCP: Patient prescribed z-pak for sinus congestion 01/11/21 McDonough, Si Gaul, PA-C. For medicare Weakness. No medication changes.   Recent consult visits:   02/20/21: Radiology, for mammogram  Hospital visits:  None in last 6 months   Medication History: Rosuvastatin 40 mg 90 DS 11/14/20 Losartan 100 mg 90 DS 11/10/20 (patient states picked up last week)   Objective:  Lab Results  Component Value Date   CREATININE 1.11 (H) 01/29/2021   BUN 12 01/29/2021   GFRNONAA 50 (L) 01/29/2021   GFRAA 55 (L) 08/03/2019   NA 138 01/29/2021   K 3.9 01/29/2021   CALCIUM 9.4 01/29/2021   CO2 26 01/29/2021   GLUCOSE 116 (H) 01/29/2021    Lab Results  Component Value Date/Time   HGBA1C 5.7 (H) 02/23/2016 10:46 AM    Last diabetic Eye exam: No results found for: HMDIABEYEEXA  Last diabetic Foot exam: No results found for: HMDIABFOOTEX   Lab Results  Component Value Date   CHOL 221 (H) 01/29/2021   HDL 48 01/29/2021   LDLCALC 142 (H) 01/29/2021   TRIG 153 (H) 01/29/2021   CHOLHDL 4.6 01/29/2021    Hepatic Function Latest Ref Rng & Units 01/29/2021 11/03/2020 08/03/2019  Total Protein 6.5 - 8.1 g/dL 8.0 7.5 7.4  Albumin 3.5 - 5.0 g/dL 4.0 4.0 4.1  AST 15 - 41 U/L $Remo'20 19 19  'GhcoE$ ALT 0 - 44 U/L $Remo'10 11 12  'SNHFE$ Alk Phosphatase 38 - 126 U/L 89 72 81  Total Bilirubin 0.3 - 1.2 mg/dL 0.9 0.9 0.7  Lab Results  Component Value Date/Time   TSH 2.158 01/29/2021 10:15 AM   TSH 2.355 08/03/2019 10:49 AM   FREET4 0.91 01/29/2021 10:15 AM   FREET4 0.76 08/03/2019 10:49 AM    CBC Latest Ref Rng & Units 01/29/2021 11/03/2020 08/03/2019  WBC 4.0 - 10.5 K/uL 7.0 8.3 6.8  Hemoglobin 12.0 - 15.0 g/dL 12.6 14.2 12.9  Hematocrit 36.0 - 46.0 % 39.3 42.8 40.2  Platelets 150 - 400 K/uL 346 280 299    Lab Results  Component Value Date/Time   VD25OH 31.10 01/29/2021 10:15 AM   VD25OH 16.17 (L) 08/03/2019 10:49 AM     Clinical ASCVD: Yes  The ASCVD Risk score (Arnett DK, et al., 2019) failed to calculate for the following reasons:   The 2019 ASCVD risk score is only valid for ages 36 to 63    Depression screen PHQ 2/9 01/11/2021 10/06/2020 01/04/2020  Decreased Interest 0 0 0  Down, Depressed, Hopeless 0 0 0  PHQ - 2 Score 0 0 0     Social History   Tobacco Use  Smoking Status Former  Smokeless Tobacco Never   BP Readings from Last 3 Encounters:  01/11/21 (!) 162/80  10/13/20 (!) 157/89  10/06/20 132/84   Pulse Readings from Last 3 Encounters:  01/11/21 66  10/13/20 68  10/06/20 78   Wt Readings from Last 3 Encounters:  03/05/21 178 lb (80.7 kg)  01/11/21 181 lb (82.1 kg)  11/01/20 180 lb (81.6 kg)   BMI Readings from Last 3 Encounters:  03/05/21 30.08 kg/m  01/11/21 30.59 kg/m  11/01/20 30.90 kg/m    Assessment/Interventions: Review of patient past medical history, allergies, medications, health status, including review of consultants reports, laboratory and other test data, was performed as part of comprehensive evaluation and provision of chronic care management services.   SDOH:  (Social Determinants of Health) assessments and interventions performed: Yes  Financial Resource Strain: Low Risk    Difficulty of Paying Living Expenses: Not very hard    SDOH Screenings   Alcohol Screen: Low Risk    Last Alcohol Screening Score (AUDIT): 0  Depression (PHQ2-9): Low Risk    PHQ-2 Score: 0  Financial Resource Strain: Low Risk    Difficulty of Paying Living Expenses: Not very hard  Food Insecurity: Not on file  Housing: Not on file  Physical Activity: Not on file  Social Connections: Not on file  Stress: Not on file  Tobacco Use: Medium Risk   Smoking Tobacco Use: Former   Smokeless Tobacco Use: Never   Passive Exposure: Not on file  Transportation Needs: Not on file    CCM Care Plan  Allergies  Allergen Reactions   Amoxicillin Hives    Patient tolerated  Ceftriaxone in ED on 01/10/19.    Clindamycin Diarrhea   Gemfibrozil Other (See Comments)    muscle ache   Levofloxacin Other (See Comments)    Muscle ache for several months   Metoprolol Other (See Comments)    "Lowers blood pressure extremely low"   Simvastatin Other (See Comments)    muscle ache   Macrobid [Nitrofurantoin Macrocrystal] Diarrhea   Niacin And Related Rash   Nystatin-Triamcinolone Rash   Sulfa Antibiotics Rash    Family history of medication allergy    Medications Reviewed Today     Reviewed by Alena Bills, [facility] (Pharmacist) on 03/28/21 at 1054  Med List Status: <None>   Medication Order Taking? Sig Documenting Provider Last Dose Status Informant  acetaminophen (TYLENOL)  500 MG tablet [id number] No Take 1,000 mg by mouth every 6 (six) hours as needed for moderate pain. [provider] Taking Active Self  aspirin EC 81 MG tablet [id number] No Take 81 mg by mouth 2 (two) times daily. Swallow whole. [provider] Taking Active Self  azithromycin (ZITHROMAX) 250 MG tablet [id number]  Use as  directed for 5 days Lavera Guise, MD  Active   carvedilol (COREG) 3.125 MG tablet [id number] No Take 3.125 mg by mouth 2 (two) times daily with a meal.  [provider] Taking Expired 11/01/20 2359 Self  chlorhexidine (PERIDEX) 0.12 % solution [id number] No Use as directed 15 mLs in the mouth or throat 2 (two) times daily. Ronnell Freshwater, NP Taking Active Self  cholecalciferol (VITAMIN D) 1000 units tablet [id number] No Take 1,000 Units by mouth daily. [provider] Taking Active Self  clonazePAM (KLONOPIN) 0.5 MG tablet [id number] No TAKE 1 TABLET BY MOUTH AT BEDTIME AS NEEDED INSOMNIA/ANXIETY McDonough, Lauren K, PA-C Taking Active   diclofenac Sodium (VOLTAREN) 1 % GEL [id number] No Apply 2 g topically 4 (four) times daily.  Patient taking differently: Apply 2 g topically 4 (four) times daily as needed (pain).   McDonough, Si Gaul, PA-C  Taking Active   diltiazem (CARDIZEM CD) 120 MG 24 hr capsule [id number] No Take 120 mg by mouth daily.  [provider] Taking Expired 10/27/20 2359 Self  ergocalciferol (DRISDOL) 1.25 MG (50000 UT) capsule [id number] No Take 1 capsule (50,000 Units total) by mouth once a week. Ronnell Freshwater, NP Taking Active Self  ibuprofen (ADVIL) 600 MG tablet [id number] No Take 1 tablet (600 mg total) by mouth 2 (two) times daily as needed.  Patient taking differently: Take 600 mg by mouth 2 (two) times daily as needed for moderate pain.   Ronnell Freshwater, NP Taking Active   icosapent Ethyl (VASCEPA) 1 g capsule [id number] No Take 2 g by mouth 2 (two) times daily. [provider] Taking Active Self  linaclotide Rolan Lipa) 290 MCG CAPS capsule [id number] No Take 1 capsule (290 mcg total) by mouth daily. Ronnell Freshwater, NP Taking Active Self  losartan (COZAAR) 100 MG tablet [id number] No Take 100 mg by mouth daily. [provider] Taking Active Self  pantoprazole (PROTONIX) 40 MG tablet [id number] No Take 40 mg by mouth in the morning. [provider] Taking Active Self  rosuvastatin (CRESTOR) 40 MG tablet [id number] No Take 40 mg by mouth at bedtime. [provider] Taking Active Self            Patient Active Problem List   Diagnosis Date Noted   Groin strain 03/25/2020   Encounter for general adult medical examination with abnormal findings 01/19/2020   Encounter for screening mammogram for malignant neoplasm of breast 01/19/2020   Infection of tooth socket 01/18/2020   Atherosclerosis of aorta ([place]) 01/12/2020   Left hip pain 06/27/2019   Osteopenia of multiple sites 04/06/2019   Sepsis ([place]) 01/10/2019   Nausea 08/21/2018   Cough 04/16/2018   Screening for osteoporosis 12/24/2017   Generalized anxiety disorder 12/24/2017   Need for shingles vaccine 12/24/2017   Urinary tract infection without hematuria 09/12/2017   Acute sinusitis, unspecified  04/24/2017   Noninfective gastroenteritis and colitis, unspecified 04/24/2017   Solitary pulmonary nodule 04/24/2017   Acute pharyngitis, unspecified 04/24/2017   Chronic kidney disease, 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/2017   Palpitations 04/24/2017   Primary generalized (osteo)arthritis 04/24/2017   Vitamin D deficiency  04/24/2017   Impaired fasting glucose 04/24/2017   Dysuria 04/24/2017   Insomnia, unspecified 04/24/2017   Age-related osteoporosis without current pathological fracture 04/24/2017   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   Paroxysmal supraventricular tachycardia ([place]) 05/23/2015   Family history of breast cancer in first degree relative 02/02/2015   Family history of ovarian cancer 02/02/2015   Irritable bowel syndrome with constipation 02/02/2015   Anxiety 02/02/2015   Osteoporosis 02/02/2015   Benign essential HTN 12/16/2014   Mixed hyperlipidemia 05/19/2014   Acid reflux 05/19/2014   Arteriosclerosis of coronary artery 05/19/2014   Chest pain 05/19/2014   Gastroesophageal reflux disease 05/19/2014   Coronary artery disease 05/19/2014   Cystocele, midline 07/01/2013   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   Urge incontinence 95/74/7340   Renal colic 37/12/6436   Symptoms involving urinary system 11/20/2012   Incomplete bladder emptying 11/20/2012   Frank hematuria 11/20/2012   Bladder infection, chronic 11/20/2012    Immunization History  Administered Date(s) Administered   Fluad Quad(high Dose 65+) 01/12/2019   Influenza, High Dose Seasonal PF 03/07/2017, 12/30/2017   Influenza, Seasonal, Injecte, Preservative Fre 03/01/2005   Influenza-Unspecified 12/30/2017, 02/16/2020   PFIZER(Purple Top)SARS-COV-2 Vaccination 08/25/2019   Pneumococcal Conjugate-13 03/07/2017   Pneumococcal Polysaccharide-23 12/27/2004, 07/22/2018    Conditions to be addressed/monitored:  HTN, CAD, GERD, IBS-C, Osteoporosis, HLD  Care  Plan : [place]  Updates made by Alena Bills, [place] since 03/28/2021 12:00 AM     Problem: HTN, CAD, GERD, IBS-C, Osteoporosis, HLD   Priority: High  Onset Date: 11/17/2020     Long-Range Goal: Patient-Specific Goal   Start Date: 11/17/2020  Expected End Date: 03/27/2022  This Visit's Progress: On track  Recent Progress: On track  Priority: High  Note:   Current Barriers:  None identified at this time  Pharmacist Clinical Goal(s):  Patient will verbalize ability to afford treatment regimen through collaboration with PharmD and provider.   Interventions: 1:1 collaboration with Lavera Guise, MD regarding development and update of comprehensive plan of care as evidenced by provider attestation and co-signature Inter-disciplinary care team collaboration (see longitudinal plan of care) Comprehensive medication review performed; medication list updated in electronic medical record  Hypertension (BP goal <140/90) -Uncontrolled -Current treatment: Carvedilol 3.125mg  twice daily Diltiazem CD 120mg  once daily Losartan 100mg  daily -Medications previously tried: None noted  -Current home readings: 140s/90s last week -Current dietary habits: watches her salt and carbs -Current exercise habits: stays active by keeping her house clean, cooking, and working in yard -Denies hypotensive/hypertensive symptoms -Educated on BP goals and benefits of medications for prevention of heart attack, stroke and kidney damage; Daily salt intake goal < 2300 mg; Exercise goal of 150 minutes per week; Importance of home blood pressure monitoring; -Counseled to monitor BP at home twice weekly, document, and provide log at future appointments -Counseled on diet and exercise extensively Recommended to continue current medication  Hyperlipidemia: (LDL goal < 100) -Uncontrolled -Current treatment: Rosuvastatin 40mg  daily Vascepa 2g twice daily -Medications previously tried: Simvastatin   -Current dietary patterns: see above -Current exercise habits: see above -Educated on Cholesterol goals;  Importance of limiting foods high in cholesterol; Exercise goal of 150 minutes per week; -Counseled on diet and exercise extensively Recommended to continue current medication -May consider addition of zetia in future if next lipid panel still elevated  Osteoporosis /Vit D Deficiency (Goal Reduce fracture risk) -Not ideally controlled -Last DEXA Scan: 01/2019  T-Score femoral neck: -2.0  10-year probability of major osteoporotic fracture: 28%  10-year probability of hip fracture: 9% -Patient is a candidate for pharmacologic treatment due to T-Score -1.0 to -2.5 and 10-year risk of major osteoporotic fracture > 20% -Current treatment  Vitamin D 1000 units daily -Medications previously tried: None noted  -Recommend [phone] units of vitamin D daily. Recommend 1200 mg of calcium daily from dietary and supplemental sources. Recommend weight-bearing and muscle strengthening exercises for building and maintaining bone density. -Counseled on diet and exercise extensively -Recommended patient continue vitamin D supplement and start taking calcium $RemoveBefore'600mg'[id number]$  twice daily. -Will consult with PCP prior to next appt to get referral for next Dexa Scan  Patient Goals/Self-Care Activities Patient will:  - take medications as prescribed as evidenced by patient report and record review check blood pressure twice weekly, document, and provide at future appointments target a minimum of 150 minutes of moderate intensity exercise weekly  Follow Up Plan: Face to Face appointment with care management team member scheduled for: 08/22/20  Alena Bills Clinical Pharmacist [phone]       Medication Assistance: None required.  Patient affirms current coverage meets needs.  Compliance/Adherence/Medication fill history: Care Gaps: Zoster Vaccines Flu vaccine Tetanus Vaccine  Star-Rating  Drugs: Rosuvastatin 40 mg 90 DS 11/14/20 Losartan 100 mg 90 DS 11/10/20 (patient states picked up last week)  Patient's preferred pharmacy is:  [id number] - [place], [place] - [address] [place] [address] Phone: [phone] Fax: [phone]  Uses pill box? No - takes out of bag Pt endorses 100% compliance  We discussed: Benefits of medication synchronization, packaging and delivery as well as enhanced pharmacist oversight with [employer]. Patient decided to: Continue current medication management strategy  Care Plan and Follow Up Patient Decision:  Patient agrees to Care Plan and Follow-up.  Plan: The care management team will reach out to the patient again over the next 120 days.   Alena Bills Clinical Pharmacist [phone]

## 2021-03-28 ENCOUNTER — Ambulatory Visit: Payer: Medicare Other | Admitting: Student-PharmD

## 2021-03-28 ENCOUNTER — Other Ambulatory Visit: Payer: Self-pay

## 2021-03-28 DIAGNOSIS — E782 Mixed hyperlipidemia: Secondary | ICD-10-CM

## 2021-03-28 DIAGNOSIS — I10 Essential (primary) hypertension: Secondary | ICD-10-CM

## 2021-03-28 DIAGNOSIS — M8589 Other specified disorders of bone density and structure, multiple sites: Secondary | ICD-10-CM

## 2021-03-28 NOTE — Patient Instructions (Addendum)
Visit Information   Goals Addressed             This Visit's Progress    Prevent Falls and Broken Bones-Osteoporosis   On track    Timeframe:  Long-Range Goal Priority:  High Start Date:  03/28/21                           Expected End Date:   03/28/2022                    Follow Up Date 08/22/21    - always use handrails on the stairs - always wear shoes or slippers with non-slip sole - get at least 10 minutes of activity every day - keep cell phone with me always - use a cane or walker    Why is this important?   When you fall, there are 3 things that control if a bone breaks or not.  These are the fall itself, how hard and the direction that you fall and how fragile your bones are.  Preventing falls is very important for you because of fragile bones.     Notes:      Track and Manage My Blood Pressure-Hypertension   On track    Timeframe:  Long-Range Goal Priority:  High Start Date: 11/17/20                            Expected End Date: 03/28/22                     Follow Up Date 08/22/21   - check blood pressure 3 times per week - choose a place to take my blood pressure (home, clinic or office, retail store) - write blood pressure results in a log or diary    Why is this important?   You won't feel high blood pressure, but it can still hurt your blood vessels.  High blood pressure can cause heart or kidney problems. It can also cause a stroke.  Making lifestyle changes like losing a little weight or eating less salt will help.  Checking your blood pressure at home and at different times of the day can help to control blood pressure.  If the doctor prescribes medicine remember to take it the way the doctor ordered.  Call the office if you cannot afford the medicine or if there are questions about it.     Notes:        Patient Care Plan: CCM Pharmacy Care Plan     Problem Identified: HTN, CAD, GERD, IBS-C, Osteoporosis, HLD   Priority: High  Onset Date:  11/17/2020     Long-Range Goal: Patient-Specific Goal   Start Date: 11/17/2020  Expected End Date: 03/27/2022  This Visit's Progress: On track  Recent Progress: On track  Priority: High  Note:   Current Barriers:  None identified at this time  Pharmacist Clinical Goal(s):  Patient will verbalize ability to afford treatment regimen through collaboration with PharmD and provider.   Interventions: 1:1 collaboration with Lavera Guise, MD regarding development and update of comprehensive plan of care as evidenced by provider attestation and co-signature Inter-disciplinary care team collaboration (see longitudinal plan of care) Comprehensive medication review performed; medication list updated in electronic medical record  Hypertension (BP goal <140/90) -Uncontrolled -Current treatment: Carvedilol 3.125mg  twice daily Diltiazem CD 120mg  once daily Losartan 100mg  daily -Medications previously  tried: None noted  -Current home readings: 140s/90s last week -Current dietary habits: watches her salt and carbs -Current exercise habits: stays active by keeping her house clean, cooking, and working in yard -Denies hypotensive/hypertensive symptoms -Educated on BP goals and benefits of medications for prevention of heart attack, stroke and kidney damage; Daily salt intake goal < 2300 mg; Exercise goal of 150 minutes per week; Importance of home blood pressure monitoring; -Counseled to monitor BP at home twice weekly, document, and provide log at future appointments -Counseled on diet and exercise extensively Recommended to continue current medication  Hyperlipidemia: (LDL goal < 100) -Uncontrolled -Current treatment: Rosuvastatin 40mg  daily Vascepa 2g twice daily -Medications previously tried: Simvastatin  -Current dietary patterns: see above -Current exercise habits: see above -Educated on Cholesterol goals;  Importance of limiting foods high in cholesterol; Exercise goal of 150  minutes per week; -Counseled on diet and exercise extensively Recommended to continue current medication -May consider addition of zetia in future if next lipid panel still elevated  Osteoporosis /Vit D Deficiency (Goal Reduce fracture risk) -Not ideally controlled -Last DEXA Scan: 01/2019   T-Score femoral neck: -2.0  10-year probability of major osteoporotic fracture: 28%  10-year probability of hip fracture: 9% -Patient is a candidate for pharmacologic treatment due to T-Score -1.0 to -2.5 and 10-year risk of major osteoporotic fracture > 20% -Current treatment  Vitamin D 1000 units daily -Medications previously tried: None noted  -Recommend [phone] units of vitamin D daily. Recommend 1200 mg of calcium daily from dietary and supplemental sources. Recommend weight-bearing and muscle strengthening exercises for building and maintaining bone density. -Counseled on diet and exercise extensively -Recommended patient continue vitamin D supplement and start taking calcium 600mg  twice daily. -Will consult with PCP prior to next appt to get referral for next Dexa Scan  Patient Goals/Self-Care Activities Patient will:  - take medications as prescribed as evidenced by patient report and record review check blood pressure twice weekly, document, and provide at future appointments target a minimum of 150 minutes of moderate intensity exercise weekly  Follow Up Plan: Face to Face appointment with care management team member scheduled for: 08/22/20  [place] Pharmacist [phone]      Patient verbalizes understanding of instructions provided today and agrees to view in [place].  Face to Face appointment with pharmacist scheduled for: 08/22/20  Alena Bills, [facility]  Clinical Pharmacist [phone]

## 2021-04-12 ENCOUNTER — Ambulatory Visit: Payer: Self-pay | Admitting: Student-PharmD

## 2021-04-12 DIAGNOSIS — E782 Mixed hyperlipidemia: Secondary | ICD-10-CM

## 2021-04-12 DIAGNOSIS — I10 Essential (primary) hypertension: Secondary | ICD-10-CM

## 2021-04-12 NOTE — Progress Notes (Signed)
Hypertension (HTN) Review Call   Amanda Duke, Amanda Duke [id number] 54 years, Female  DOB: 10-30-1940  M: [phone]  Hypertension Review  Completed by Charlann Lange on 04/11/2021  Chart Review Is the patient enrolled in RPM with BP Monitor?: No BP #1 reading (last): 162/80 on: 01/10/2021 BP #2 reading: 160/92 on: 12/10/2020 BP #3 reading: 141/81 on: 12/04/2020 Any of the last 3 BP > 140/90 mmHg?: No What recent interventions have been made by any provider to improve the patient's conditions in the last 3 months?: None since last 03/28/21 Pharm D appointment.  Any recent hospitalizations or ED visits since last visit with CPP?: No  Adherence Review Adherence rates for STAR metric medications:  Rosuvastatin 40 mg 90 DS 11/14/20, 08/14/20 Losartan 100 mg 90 DS 11/10/20, 11/05/20 Adherence rates for medications indicated for disease state being reviewed:  Losartan 100 mg 90 DS 11/10/20, 11/05/20 Does the patient have >5 day gap between last estimated fill dates for any of the above medications?: Yes Reasons for medication gaps: Rosuvastatin 40 mg 90 DS 11/14/20, 08/14/20- Patient stated she has this medication at home and is taking this medication.   Disease State Questions Able to connect with the Patient?: Yes Is the patient monitoring his/her BP?: Yes How often are you checking your BP?: occasionally Home BP Reading #1 (most recent): 140/80 Is the patient having any low BP Readings <90/60?: No Is the patient having any BP readings above >180/100?: No Is the patient's average BP>140/90?: No What is your blood pressure goal?: 120/80 Educate patient to inform proper points on checking BP at home:: Make sure using the right size cuff, the length of the cuff's bladder should be at least equal to 75% of the circumference of the upper arm., Do not drink caffeine or smoke a cigarette at least 30 min. prior to checking., Sit with feet flat on the floor, arm at heart level., When taking resting  blood pressure: sit quietly for 5 minutes, not within 30 min. of exercising, no talking. What diet changes have you made to improve your Blood Pressure Control?: limiting / monitoring salt intake What exercise are you doing to improve your Blood Pressure Control?: other Details: Patient stated she is busy doing things but no formal exercise. Misc. Response/Information:: Patient stated she had hip surgery three months ago and tries to stay busy. She stated she is always doing something at home, at church and going to stores.   Pharmacist Review Adherence gaps identified?: No Drug Therapy Problems identified?: No Assessment: Controlled  11 minutes spent in review, coordination, and documentation.  Reviewed by: Alena Bills, PharmD Clinical Pharmacist [phone]

## 2021-04-27 DIAGNOSIS — E782 Mixed hyperlipidemia: Secondary | ICD-10-CM | POA: Diagnosis not present

## 2021-04-27 DIAGNOSIS — I10 Essential (primary) hypertension: Secondary | ICD-10-CM | POA: Diagnosis not present

## 2021-05-17 ENCOUNTER — Encounter: Payer: Self-pay | Admitting: Physician Assistant

## 2021-05-17 ENCOUNTER — Other Ambulatory Visit: Payer: Self-pay

## 2021-05-17 ENCOUNTER — Ambulatory Visit (INDEPENDENT_AMBULATORY_CARE_PROVIDER_SITE_OTHER): Payer: Medicare Other | Admitting: Physician Assistant

## 2021-05-17 VITALS — BP 142/84 | HR 72 | Temp 98.4°F | Resp 16 | Ht 64.5 in | Wt 175.6 lb

## 2021-05-17 DIAGNOSIS — N1831 Chronic kidney disease, stage 3a: Secondary | ICD-10-CM | POA: Diagnosis not present

## 2021-05-17 DIAGNOSIS — F411 Generalized anxiety disorder: Secondary | ICD-10-CM

## 2021-05-17 DIAGNOSIS — N390 Urinary tract infection, site not specified: Secondary | ICD-10-CM | POA: Diagnosis not present

## 2021-05-17 DIAGNOSIS — R300 Dysuria: Secondary | ICD-10-CM | POA: Diagnosis not present

## 2021-05-17 DIAGNOSIS — M8589 Other specified disorders of bone density and structure, multiple sites: Secondary | ICD-10-CM | POA: Diagnosis not present

## 2021-05-17 DIAGNOSIS — I10 Essential (primary) hypertension: Secondary | ICD-10-CM | POA: Diagnosis not present

## 2021-05-17 DIAGNOSIS — E782 Mixed hyperlipidemia: Secondary | ICD-10-CM

## 2021-05-17 DIAGNOSIS — E2839 Other primary ovarian failure: Secondary | ICD-10-CM | POA: Diagnosis not present

## 2021-05-17 LAB — POCT URINALYSIS DIPSTICK
Bilirubin, UA: NEGATIVE
Blood, UA: NEGATIVE
Glucose, UA: NEGATIVE
Nitrite, UA: NEGATIVE
Protein, UA: NEGATIVE
Spec Grav, UA: 1.015 (ref 1.010–1.025)
Urobilinogen, UA: 0.2 E.U./dL
pH, UA: 6.5 (ref 5.0–8.0)

## 2021-05-17 MED ORDER — CEPHALEXIN 500 MG PO CAPS: 500.0000 mg | ORAL_CAPSULE | Freq: Two times a day (BID) | ORAL | 0 refills | Status: DC

## 2021-05-17 MED ORDER — CLONAZEPAM 0.5 MG PO TABS: ORAL_TABLET | ORAL | 1 refills | Status: DC

## 2021-05-17 NOTE — Progress Notes (Signed)
[facility] [place], [place] [address]  Internal MEDICINE  Office Visit Note  Patient Name: Amanda Duke  [id number]  [id number]  Date of Service: 05/23/2021  Chief Complaint  Patient presents with   Follow-up   Hyperlipidemia   Hypertension   Gastroesophageal Reflux   Urinary Tract Infection    Some pelvic pain and discomfort when urinating - started 2 days ago    HPI Pt is here with routine follow up -Will get in touch with gyn regarding pelvic pain and recurrent UTIs. Had previously offered her a pessary vs surgery but she declined at the time and has decided it is time to try something -some urgency and and lower abdominal pain currently, some incontinence at night and wearing depends.  -Hip surgery is doing well and therapy went well. Kizzie Furnish have avulsion and was told it would heal on its own. -some tension headaches likely from postures as it comes from neck and shoulders -Also due for updated bone density scan -discussed labs still show some abnormal kidney function, and elevated cholesterol though is improved from last check. Continues to take crestor  Current Medication: Outpatient Encounter Medications as of 05/17/2021  Medication Sig   acetaminophen (TYLENOL) 500 MG tablet Take 1,000 mg by mouth every 6 (six) hours as needed for moderate pain.   aspirin EC 81 MG tablet Take 81 mg by mouth 2 (two) times daily. Swallow whole.   cephALEXin (KEFLEX) 500 MG capsule Take 1 capsule (500 mg total) by mouth 2 (two) times daily.   chlorhexidine (PERIDEX) 0.12 % solution Use as directed 15 mLs in the mouth or throat 2 (two) times daily.   cholecalciferol (VITAMIN D) 1000 units tablet Take 1,000 Units by mouth daily.   diclofenac Sodium (VOLTAREN) 1 % GEL Apply 2 g topically 4 (four) times daily. (Patient taking differently: Apply 2 g topically 4 (four) times daily as needed (pain).)   ergocalciferol (DRISDOL) 1.25 MG (50000 UT) capsule Take 1 capsule  (50,000 Units total) by mouth once a week.   ibuprofen (ADVIL) 600 MG tablet Take 1 tablet (600 mg total) by mouth 2 (two) times daily as needed. (Patient taking differently: Take 600 mg by mouth 2 (two) times daily as needed for moderate pain.)   icosapent Ethyl (VASCEPA) 1 g capsule Take 2 g by mouth 2 (two) times daily.   linaclotide (LINZESS) 290 MCG CAPS capsule Take 1 capsule (290 mcg total) by mouth daily.   losartan (COZAAR) 100 MG tablet Take 100 mg by mouth daily.   pantoprazole (PROTONIX) 40 MG tablet Take 40 mg by mouth in the morning.   rosuvastatin (CRESTOR) 40 MG tablet Take 40 mg by mouth at bedtime.   [DISCONTINUED] clonazePAM (KLONOPIN) 0.5 MG tablet TAKE 1 TABLET BY MOUTH AT BEDTIME AS NEEDED INSOMNIA/ANXIETY   carvedilol (COREG) 3.125 MG tablet Take 3.125 mg by mouth 2 (two) times daily with a meal.    clonazePAM (KLONOPIN) 0.5 MG tablet Take 1 tablet by mouth as needed   diltiazem (CARDIZEM CD) 120 MG 24 hr capsule Take 120 mg by mouth daily.    [DISCONTINUED] azithromycin (ZITHROMAX) 250 MG tablet Use as  directed for 5 days (Patient not taking: Reported on 05/17/2021)   No facility-administered encounter medications on file as of 05/17/2021.    Surgical History: Past Surgical History:  Procedure Laterality Date   [facility] [facility]   COLONOSCOPY WITH PROPOFOL  N/A 12/12/2016   Procedure: COLONOSCOPY WITH PROPOFOL;  Surgeon: Lollie Sails, MD;  Location: [facility] ENDOSCOPY;  Service: Endoscopy;  Laterality: N/A;   COLONOSCOPY WITH PROPOFOL N/A 07/14/2019   Procedure: COLONOSCOPY WITH PROPOFOL;  Surgeon: Toledo, Benay Pike, MD;  Location: [facility] ENDOSCOPY;  Service: Gastroenterology;  Laterality: N/A;   ESOPHAGOGASTRODUODENOSCOPY (EGD) WITH PROPOFOL N/A 12/12/2016   Procedure: ESOPHAGOGASTRODUODENOSCOPY (EGD) WITH PROPOFOL;  Surgeon: Lollie Sails, MD;  Location[facility] ENDOSCOPY;  Service: Endoscopy;  Laterality: N/A;    ESOPHAGOGASTRODUODENOSCOPY (EGD) WITH PROPOFOL N/A 07/14/2019   Procedure: ESOPHAGOGASTRODUODENOSCOPY (EGD) WITH PROPOFOL;  Surgeon: Toledo, Benay Pike, MD;  Location: [facility] ENDOSCOPY;  Service: Gastroenterology;  Laterality: N/A;   LAPAROSCOPIC UNILATERAL SALPINGO OOPHERECTOMY  1985   right   ORIF ANKLE FRACTURE Right    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    TONSILLECTOMY  1948   TUBAL LIGATION  1971   VAGINAL HYSTERECTOMY  1985   d/t heavy bleeding    Medical History: Past Medical History:  Diagnosis Date   Anxiety    Coronary artery disease    Gastritis    GERD (gastroesophageal reflux disease)    History of colonic polyps    Hyperlipemia    Hypertension    Irregular heart beat    Pneumonia    Pre-diabetes    Prediabetes    Redundant colon    Renal cyst    bilateral    Family History: Family History  Problem Relation Age of Onset   Heart failure Mother    Stroke Mother    Breast cancer Mother        dx at age 69 yo   Diabetes Mother    Osteoporosis Mother    Emphysema Father    Ovarian cancer Sister        dx at age 14 yo   Asthma Sister    Diabetes Daughter    Heart disease Daughter    COPD Daughter     Social History   Socioeconomic History   Marital status: Widowed    Spouse name: Not on file   Number of children: Not on file   Years of education: Not on file   Highest education level: Not on file  Occupational History   Not on file  Tobacco Use   Smoking status: Former   Smokeless tobacco: Never  Vaping Use   Vaping Use: Never used  Substance and Sexual Activity   Alcohol use: No    Alcohol/week: 0.0 standard drinks   Drug use: Never   Sexual activity: Not Currently  Other Topics Concern   Not on file  Social History Narrative   Not on file   Social Determinants of Health   Financial Resource Strain: Low Risk    Difficulty of Paying Living Expenses: Not very hard  Food Insecurity: Not on file  Transportation Needs:  Not on file  Physical Activity: Not on file  Stress: Not on file  Social Connections: Not on file  Intimate Partner Violence: Not on file      Review of Systems  Constitutional:  Negative for chills, fatigue and unexpected weight change.  HENT:  Negative for congestion, postnasal drip, rhinorrhea, sneezing and sore throat.   Eyes:  Negative for redness.  Respiratory:  Negative for cough, chest tightness and shortness of breath.   Cardiovascular:  Negative for chest pain and palpitations.  Gastrointestinal:  Negative for abdominal pain, constipation, diarrhea, nausea and vomiting.  Genitourinary:  Positive for  dysuria, frequency and pelvic pain.  Musculoskeletal:  Positive for arthralgias. Negative for back pain, joint swelling and neck pain.  Skin:  Negative for rash.  Neurological: Negative.  Negative for tremors and numbness.  Hematological:  Negative for adenopathy. Does not bruise/bleed easily.  Psychiatric/Behavioral:  Negative for behavioral problems (Depression), sleep disturbance and suicidal ideas. The patient is not nervous/anxious.    Vital Signs: BP (!) 142/84    Pulse 72    Temp 98.4 F (36.9 C)    Resp 16    Ht 5' 4.5" (1.638 m)    Wt 175 lb 9.6 oz (79.7 kg)    SpO2 98%    BMI 29.68 kg/m    Physical Exam Vitals and nursing note reviewed.  Constitutional:      General: She is not in acute distress.    Appearance: She is well-developed. She is not diaphoretic.  HENT:     Head: Normocephalic and atraumatic.     Mouth/Throat:     Pharynx: No oropharyngeal exudate.  Eyes:     Pupils: Pupils are equal, round, and reactive to light.  Neck:     Thyroid: No thyromegaly.     Vascular: No JVD.     Trachea: No tracheal deviation.  Cardiovascular:     Rate and Rhythm: Normal rate and regular rhythm.     Heart sounds: Normal heart sounds. No murmur heard.   No friction rub. No gallop.  Pulmonary:     Effort: Pulmonary effort is normal. No respiratory distress.      Breath sounds: No wheezing or rales.  Chest:     Chest wall: No tenderness.  Abdominal:     General: Bowel sounds are normal.     Palpations: Abdomen is soft.     Tenderness: There is no right CVA tenderness or left CVA tenderness.  Musculoskeletal:        General: Normal range of motion.     Cervical back: Normal range of motion and neck supple.  Lymphadenopathy:     Cervical: No cervical adenopathy.  Skin:    General: Skin is warm and dry.  Neurological:     Mental Status: She is alert and oriented to person, place, and time.     Cranial Nerves: No cranial nerve deficit.  Psychiatric:        Behavior: Behavior normal.        Thought Content: Thought content normal.        Judgment: Judgment normal.       Assessment/Plan: 1. Benign essential HTN Stable, continue current medications  2. Generalized anxiety disorder May continue klonopin as needed - clonazePAM (KLONOPIN) 0.5 MG tablet; Take 1 tablet by mouth as needed  Dispense: 30 tablet; Refill: 1  3. Osteopenia of multiple sites - DG Bone Density; Future  4. Primary ovarian failure - DG Bone Density; Future  5. Mixed hyperlipidemia Continue crestor  6. Stage 3a chronic kidney disease (HCC) Continue to monitor  7. Urinary tract infection without hematuria, site unspecified - CULTURE, URINE COMPREHENSIVE  8. Dysuria Will start keflex based on symptoms and adjust as indicated based on C/S. Will follow up with OBGYN regarding pessary vs surgery - POCT Urinalysis Dipstick - cephALEXin (KEFLEX) 500 MG capsule; Take 1 capsule (500 mg total) by mouth 2 (two) times daily.  Dispense: 20 capsule; Refill: 0   General Counseling: Nozomi verbalizes understanding of the findings of todays visit and agrees with plan of treatment. I have discussed any further diagnostic  evaluation that may be needed or ordered today. We also reviewed her medications today. she has been encouraged to call the office with any questions or  concerns that should arise related to todays visit.    Orders Placed This Encounter  Procedures   CULTURE, URINE COMPREHENSIVE   DG Bone Density   POCT Urinalysis Dipstick    Meds ordered this encounter  Medications   clonazePAM (KLONOPIN) 0.5 MG tablet    Sig: Take 1 tablet by mouth as needed    Dispense:  30 tablet    Refill:  1    Not to exceed 4 additional fills before 04/04/2021   cephALEXin (KEFLEX) 500 MG capsule    Sig: Take 1 capsule (500 mg total) by mouth 2 (two) times daily.    Dispense:  20 capsule    Refill:  0    This patient was seen by Drema Dallas, PA-C in collaboration with Dr. Clayborn Bigness as a part of collaborative care agreement.   Total time spent:30 Minutes Time spent includes review of chart, medications, test results, and follow up plan with the patient.      Dr Lavera Guise Internal medicine

## 2021-05-21 LAB — CULTURE, URINE COMPREHENSIVE

## 2021-05-22 ENCOUNTER — Telehealth: Payer: Self-pay

## 2021-05-22 NOTE — Telephone Encounter (Signed)
-----   Message from Mylinda Latina, PA-C sent at 05/22/2021  3:00 PM EST ----- Please let her know that her urine culture just showed mixed urogenital flora

## 2021-05-22 NOTE — Telephone Encounter (Signed)
Spoke to pt and informed her that her Urine culture showed mixed urogenital flora

## 2021-05-29 DIAGNOSIS — M8589 Other specified disorders of bone density and structure, multiple sites: Secondary | ICD-10-CM | POA: Diagnosis not present

## 2021-05-29 DIAGNOSIS — Z780 Asymptomatic menopausal state: Secondary | ICD-10-CM | POA: Diagnosis not present

## 2021-05-31 DIAGNOSIS — H2513 Age-related nuclear cataract, bilateral: Secondary | ICD-10-CM | POA: Diagnosis not present

## 2021-06-12 ENCOUNTER — Telehealth: Payer: Self-pay | Admitting: Student-PharmD

## 2021-06-12 NOTE — Progress Notes (Addendum)
General Review Call   Amanda Duke, Gudiel [id number] 56 years, Female  DOB: Jan 21, 1941  M: [phone]  General Review [facility]) Completed by Charlann Lange on 06/12/2021  Chart Review What recent interventions/DTPs have been made by any provider to improve the patient's conditions in the last 3 months?:   Office Visit: 05/17/21 Mylinda Latina, PA-C. For Benign essential HTN. STARTED Cephalexin 500 mg 2 times daily. STOPPED Azithromycin.  Any recent hospitalizations or ED visits since last visit with CPP?: No  Adherence Review Adherence rates for STAR metric medications: Rosuvastatin 40 mg 05/14/21 90 DS Losartan 100 mg 05/14/21 90 DS  Adherence rates for medications indicated for disease state being reviewed: Rosuvastatin 40 mg 05/14/21 90 DS Losartan 100 mg 05/14/21 90 DS  Does the patient have >5 day gap between last estimated fill dates for any of the above medications?: No  Disease State Questions Able to connect with the Patient?: Yes  Did patient have any problems with their health recently?: No  Did patient have any problems with their pharmacy?: No  Does patient have any issues or side effects with their medications?: No  Additional  information to pass to Patient's CPP?: No  Anything we can do to help take better care of Patient?: No  Engagement Notes Charlann Lange on 06/12/2021 10:07 AM HC Chart Review: 5 min 06/12/21 HC Assessment call time spent: 5 min 06/12/21 CP Review: 10 min 06/12/21  Charlann Lange, [place]  [phone]  Pharmacist Review Adherence gaps identified?: No Drug Therapy Problems identified?: No Assessment: Controlled  Alena Bills Clinical Pharmacist

## 2021-06-26 DIAGNOSIS — E782 Mixed hyperlipidemia: Secondary | ICD-10-CM | POA: Diagnosis not present

## 2021-06-26 DIAGNOSIS — I10 Essential (primary) hypertension: Secondary | ICD-10-CM | POA: Diagnosis not present

## 2021-07-30 DIAGNOSIS — M7062 Trochanteric bursitis, left hip: Secondary | ICD-10-CM | POA: Diagnosis not present

## 2021-08-16 ENCOUNTER — Encounter: Payer: Self-pay | Admitting: Physician Assistant

## 2021-08-16 ENCOUNTER — Ambulatory Visit (INDEPENDENT_AMBULATORY_CARE_PROVIDER_SITE_OTHER): Payer: Medicaid Other | Admitting: Physician Assistant

## 2021-08-16 DIAGNOSIS — I10 Essential (primary) hypertension: Secondary | ICD-10-CM

## 2021-08-16 DIAGNOSIS — F411 Generalized anxiety disorder: Secondary | ICD-10-CM

## 2021-08-16 DIAGNOSIS — M8589 Other specified disorders of bone density and structure, multiple sites: Secondary | ICD-10-CM

## 2021-08-16 DIAGNOSIS — E782 Mixed hyperlipidemia: Secondary | ICD-10-CM | POA: Diagnosis not present

## 2021-08-16 DIAGNOSIS — N3941 Urge incontinence: Secondary | ICD-10-CM | POA: Diagnosis not present

## 2021-08-16 MED ORDER — SOLIFENACIN SUCCINATE 5 MG PO TABS
5.0000 mg | ORAL_TABLET | Freq: Every day | ORAL | 2 refills | Status: DC
Start: 2021-08-16 — End: 2021-11-05

## 2021-08-16 MED ORDER — CLONAZEPAM 0.5 MG PO TABS: ORAL_TABLET | ORAL | 1 refills | Status: DC

## 2021-08-16 NOTE — Progress Notes (Signed)
[facility] ?[address] ?[place], [place] [address] ? ?Internal MEDICINE  ?Office Visit Note ? ?Patient Name: Amanda Duke ? [id number]  ?[id number] ? ?Date of Service: 08/16/2021 ? ?Chief Complaint  ?Patient presents with  ? Follow-up  ? Hyperlipidemia  ? Hypertension  ? Gastroesophageal Reflux  ? Over Active Bladder  ?  Having issues with bladder control  ? Quality Metric Gaps  ?  Shingles Vaccine  ? ? ?HPI ?Pt is here for routine follow up ?-Went back to ortho for hip pain and had bursitis and had an injection done. She previously had hip replacement, but they said that looks good. Doing therapy exercises at home. ?-Continues to have bladder concerns, but no UTI symptoms. Wears depends at night. Had been considering pessary vs surgical intervention with GYN, but is trying to avoid this. Is interested in discussing medication to help symptoms for now and then if not improving may reconsider the GYN intervention options ?-Has appt with cardiology in May for follow up ?-hernia bothers her occasionally and continues to take omeprazole ?-reviewed bone density showing low bone mass, but not significant change from prior. Supplementing vitamin D and discussed calcium as well ? ?Current Medication: ?Outpatient Encounter Medications as of 08/16/2021  ?Medication Sig  ? acetaminophen (TYLENOL) 500 MG tablet Take 1,000 mg by mouth every 6 (six) hours as needed for moderate pain.  ? aspirin EC 81 MG tablet Take 81 mg by mouth 2 (two) times daily. Swallow whole.  ? chlorhexidine (PERIDEX) 0.12 % solution Use as directed 15 mLs in the mouth or throat 2 (two) times daily.  ? cholecalciferol (VITAMIN D) 1000 units tablet Take 1,000 Units by mouth daily.  ? diclofenac Sodium (VOLTAREN) 1 % GEL Apply 2 g topically 4 (four) times daily. (Patient taking differently: Apply 2 g topically 4 (four) times daily as needed (pain).)  ? ergocalciferol (DRISDOL) 1.25 MG (50000 UT) capsule Take 1 capsule (50,000 Units total) by  mouth once a week.  ? ibuprofen (ADVIL) 600 MG tablet Take 1 tablet (600 mg total) by mouth 2 (two) times daily as needed. (Patient taking differently: Take 600 mg by mouth 2 (two) times daily as needed for moderate pain.)  ? icosapent Ethyl (VASCEPA) 1 g capsule Take 2 g by mouth 2 (two) times daily.  ? linaclotide (LINZESS) 290 MCG CAPS capsule Take 1 capsule (290 mcg total) by mouth daily.  ? losartan (COZAAR) 100 MG tablet Take 100 mg by mouth daily.  ? pantoprazole (PROTONIX) 40 MG tablet Take 40 mg by mouth in the morning.  ? rosuvastatin (CRESTOR) 40 MG tablet Take 40 mg by mouth at bedtime.  ? solifenacin (VESICARE) 5 MG tablet Take 1 tablet (5 mg total) by mouth daily.  ? [DISCONTINUED] cephALEXin (KEFLEX) 500 MG capsule Take 1 capsule (500 mg total) by mouth 2 (two) times daily.  ? [DISCONTINUED] clonazePAM (KLONOPIN) 0.5 MG tablet Take 1 tablet by mouth as needed  ? carvedilol (COREG) 3.125 MG tablet Take 3.125 mg by mouth 2 (two) times daily with a meal.   ? clonazePAM (KLONOPIN) 0.5 MG tablet Take 1 tablet by mouth as needed  ? diltiazem (CARDIZEM CD) 120 MG 24 hr capsule Take 120 mg by mouth daily.   ? ?No facility-administered encounter medications on file as of 08/16/2021.  ? ? ?Surgical History: ?Past Surgical History:  ?Procedure Laterality Date  ? APPENDECTOMY  1971  ? ARTHRODESIS  1980  ? [place]  ? COLONOSCOPY  WITH PROPOFOL N/A 12/12/2016  ? Procedure: COLONOSCOPY WITH PROPOFOL;  Surgeon: Lollie Sails, MD;  Location: [facility] ENDOSCOPY;  Service: Endoscopy;  Laterality: N/A;  ? COLONOSCOPY WITH PROPOFOL N/A 07/14/2019  ? Procedure: COLONOSCOPY WITH PROPOFOL;  Surgeon: Toledo, Benay Pike, MD;  Location: [facility] ENDOSCOPY;  Service: Gastroenterology;  Laterality: N/A;  ? ESOPHAGOGASTRODUODENOSCOPY (EGD) WITH PROPOFOL N/A 12/12/2016  ? Procedure: ESOPHAGOGASTRODUODENOSCOPY (EGD) WITH PROPOFOL;  Surgeon: Lollie Sails, MD;  Location[facility] ENDOSCOPY;  Service: Endoscopy;  Laterality:  N/A;  ? ESOPHAGOGASTRODUODENOSCOPY (EGD) WITH PROPOFOL N/A 07/14/2019  ? Procedure: ESOPHAGOGASTRODUODENOSCOPY (EGD) WITH PROPOFOL;  Surgeon: Toledo, Benay Pike, MD;  Location: [facility] ENDOSCOPY;  Service: Gastroenterology;  Laterality: N/A;  ? [place]  ? right  ? ORIF ANKLE FRACTURE Right   ? 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 TONSILLECTOMY  1948  ? TUBAL LIGATION  1971  ? VAGINAL HYSTERECTOMY  1985  ? d/t heavy bleeding  ? ? ?Medical History: ?Past Medical History:  ?Diagnosis Date  ? Anxiety   ? Coronary artery disease   ? Gastritis   ? GERD (gastroesophageal reflux disease)   ? History of colonic polyps   ? Hyperlipemia   ? Hypertension   ? Irregular heart beat   ? Pneumonia   ? Pre-diabetes   ? Prediabetes   ? Redundant colon   ? Renal cyst   ? bilateral  ? ? ?Family History: ?Family History  ?Problem Relation Age of Onset  ? Heart failure Mother   ? Stroke Mother   ? Breast cancer Mother   ?     dx at age 40 yo  ? Diabetes Mother   ? Osteoporosis Mother   ? Emphysema Father   ? Ovarian cancer Sister   ?     dx at age 43 yo  ? Asthma Sister   ? Diabetes Daughter   ? Heart disease Daughter   ? COPD Daughter   ? ? ?Social History  ? ?Socioeconomic History  ? Marital status: Widowed  ?  Spouse name: Not on file  ? Number of children: Not on file  ? Years of education: Not on file  ? Highest education level: Not on file  ?Occupational History  ? Not on file  ?Tobacco Use  ? Smoking status: Former  ? Smokeless tobacco: Never  ?Vaping Use  ? Vaping Use: Never used  ?Substance and Sexual Activity  ? Alcohol use: No  ?  Alcohol/week: 0.0 standard drinks  ? Drug use: Never  ? Sexual activity: Not Currently  ?Other Topics Concern  ? Not on file  ?Social History Narrative  ? Not on file  ? ?Social Determinants of Health  ? ?Financial Resource Strain: Low Risk   ? Difficulty of Paying Living Expenses: Not very hard  ?Food Insecurity: Not on file  ?Transportation  Needs: Not on file  ?Physical Activity: Not on file  ?Stress: Not on file  ?Social Connections: Not on file  ?Intimate Partner Violence: Not on file  ? ? ? ? ?Review of Systems  ?Constitutional:  Negative for chills, fatigue and unexpected weight change.  ?HENT:  Negative for congestion, rhinorrhea, sneezing and sore throat.   ?Eyes:  Negative for redness.  ?Respiratory:  Negative for cough, chest tightness and shortness of breath.   ?Cardiovascular:  Negative for chest pain and palpitations.  ?Gastrointestinal:  Negative for abdominal pain, constipation, diarrhea, nausea and vomiting.  ?Genitourinary:  Negative for  dysuria, frequency, hematuria and pelvic pain.  ?     Incontinence  ?Musculoskeletal:  Positive for arthralgias. Negative for back pain, joint swelling and neck pain.  ?Skin:  Negative for rash.  ?Neurological: Negative.  Negative for tremors and numbness.  ?Hematological:  Negative for adenopathy. Does not bruise/bleed easily.  ?Psychiatric/Behavioral:  Negative for behavioral problems (Depression), sleep disturbance and suicidal ideas. The patient is not nervous/anxious.   ? ?Vital Signs: ?BP 140/78 Comment: 145/76  Pulse 81   Temp 98.4 ?F (36.9 ?C)   Resp 16   Ht 5' 4.5" (1.638 m)   Wt 173 lb 12.8 oz (78.8 kg)   SpO2 98%   BMI 29.37 kg/m?  ? ? ?Physical Exam ?Vitals and nursing note reviewed.  ?Constitutional:   ?   General: She is not in acute distress. ?   Appearance: She is well-developed. She is not diaphoretic.  ?HENT:  ?   Head: Normocephalic and atraumatic.  ?   Mouth/Throat:  ?   Pharynx: No oropharyngeal exudate.  ?Eyes:  ?   Pupils: Pupils are equal, round, and reactive to light.  ?Neck:  ?   Thyroid: No thyromegaly.  ?   Vascular: No JVD.  ?   Trachea: No tracheal deviation.  ?Cardiovascular:  ?   Rate and Rhythm: Normal rate and regular rhythm.  ?   Heart sounds: Normal heart sounds. No murmur heard. ?  No friction rub. No gallop.  ?Pulmonary:  ?   Effort: Pulmonary effort is  normal. No respiratory distress.  ?   Breath sounds: No wheezing or rales.  ?Chest:  ?   Chest wall: No tenderness.  ?Abdominal:  ?   General: Bowel sounds are normal.  ?   Palpations: Abdomen is soft.  ?Musculoskele

## 2021-08-22 ENCOUNTER — Ambulatory Visit: Payer: Medicaid Other

## 2021-09-10 DIAGNOSIS — E782 Mixed hyperlipidemia: Secondary | ICD-10-CM | POA: Diagnosis not present

## 2021-09-10 DIAGNOSIS — Z96641 Presence of right artificial hip joint: Secondary | ICD-10-CM | POA: Diagnosis not present

## 2021-09-10 DIAGNOSIS — I2510 Atherosclerotic heart disease of native coronary artery without angina pectoris: Secondary | ICD-10-CM | POA: Diagnosis not present

## 2021-09-10 DIAGNOSIS — I471 Supraventricular tachycardia: Secondary | ICD-10-CM | POA: Diagnosis not present

## 2021-09-10 DIAGNOSIS — M7062 Trochanteric bursitis, left hip: Secondary | ICD-10-CM | POA: Diagnosis not present

## 2021-09-10 DIAGNOSIS — E785 Hyperlipidemia, unspecified: Secondary | ICD-10-CM | POA: Insufficient documentation

## 2021-09-10 DIAGNOSIS — I10 Essential (primary) hypertension: Secondary | ICD-10-CM | POA: Diagnosis not present

## 2021-09-10 DIAGNOSIS — N182 Chronic kidney disease, stage 2 (mild): Secondary | ICD-10-CM | POA: Diagnosis not present

## 2021-09-13 ENCOUNTER — Ambulatory Visit: Payer: Medicaid Other | Admitting: Physician Assistant

## 2021-11-05 ENCOUNTER — Other Ambulatory Visit: Payer: Self-pay | Admitting: Physician Assistant

## 2021-11-05 DIAGNOSIS — N3941 Urge incontinence: Secondary | ICD-10-CM

## 2021-11-11 ENCOUNTER — Other Ambulatory Visit: Payer: Self-pay

## 2021-11-11 ENCOUNTER — Emergency Department: Payer: Medicare Other

## 2021-11-11 ENCOUNTER — Emergency Department
Admission: EM | Admit: 2021-11-11 | Discharge: 2021-11-12 | Disposition: A | Discharge status: Home / Self Care | Payer: Medicare Other | Attending: Emergency Medicine | Admitting: Emergency Medicine

## 2021-11-11 DIAGNOSIS — I2510 Atherosclerotic heart disease of native coronary artery without angina pectoris: Secondary | ICD-10-CM | POA: Diagnosis not present

## 2021-11-11 DIAGNOSIS — R079 Chest pain, unspecified: Secondary | ICD-10-CM | POA: Diagnosis not present

## 2021-11-11 DIAGNOSIS — R0789 Other chest pain: Secondary | ICD-10-CM

## 2021-11-11 DIAGNOSIS — R0600 Dyspnea, unspecified: Secondary | ICD-10-CM | POA: Diagnosis not present

## 2021-11-11 DIAGNOSIS — I10 Essential (primary) hypertension: Secondary | ICD-10-CM | POA: Diagnosis not present

## 2021-11-11 DIAGNOSIS — K449 Diaphragmatic hernia without obstruction or gangrene: Secondary | ICD-10-CM | POA: Diagnosis not present

## 2021-11-11 LAB — BASIC METABOLIC PANEL
Anion gap: 9 (ref 5–15)
BUN: 16 mg/dL (ref 8–23)
CO2: 24 mmol/L (ref 22–32)
Calcium: 9.3 mg/dL (ref 8.9–10.3)
Chloride: 106 mmol/L (ref 98–111)
Creatinine, Ser: 1.11 mg/dL — ABNORMAL HIGH (ref 0.44–1.00)
GFR, Estimated: 50 mL/min — ABNORMAL LOW (ref >60)
Glucose, Bld: 98 mg/dL (ref 70–99)
Potassium: 3.8 mmol/L (ref 3.5–5.1)
Sodium: 139 mmol/L (ref 135–145)

## 2021-11-11 LAB — URINALYSIS, ROUTINE W REFLEX MICROSCOPIC
Bilirubin Urine: NEGATIVE
Glucose, UA: NEGATIVE mg/dL
Hgb urine dipstick: NEGATIVE
Ketones, ur: NEGATIVE mg/dL
Nitrite: NEGATIVE
Protein, ur: NEGATIVE mg/dL
Specific Gravity, Urine: 1.01 (ref 1.005–1.030)
pH: 7 (ref 5.0–8.0)

## 2021-11-11 LAB — URINALYSIS, MICROSCOPIC (REFLEX)
Bacteria, UA: NONE SEEN
RBC / HPF: NONE SEEN RBC/hpf (ref 0–5)
Squamous Epithelial / HPF: NONE SEEN (ref 0–5)

## 2021-11-11 LAB — CBC
HCT: 40.4 % (ref 36.0–46.0)
Hemoglobin: 12.5 g/dL (ref 12.0–15.0)
MCH: 25.6 pg — ABNORMAL LOW (ref 26.0–34.0)
MCHC: 30.9 g/dL (ref 30.0–36.0)
MCV: 82.8 fL (ref 80.0–100.0)
Platelets: 333 10*3/uL (ref 150–400)
RBC: 4.88 MIL/uL (ref 3.87–5.11)
RDW: 16.4 % — ABNORMAL HIGH (ref 11.5–15.5)
WBC: 7.7 10*3/uL (ref 4.0–10.5)
nRBC: 0 % (ref 0.0–0.2)

## 2021-11-11 LAB — TROPONIN I (HIGH SENSITIVITY)
Troponin I (High Sensitivity): 4 ng/L (ref <18)
Troponin I (High Sensitivity): 5 ng/L (ref <18)

## 2021-11-11 MED ORDER — ALUM & MAG HYDROXIDE-SIMETH 200-200-20 MG/5ML PO SUSP
30.0000 mL | Freq: Once | ORAL | Status: AC
  Administered 2021-11-11: 30 mL via ORAL
  Filled 2021-11-11: qty 30

## 2021-11-11 MED ORDER — LIDOCAINE VISCOUS HCL 2 % MT SOLN
15.0000 mL | Freq: Once | OROMUCOSAL | Status: AC
  Administered 2021-11-11: 15 mL via OROMUCOSAL
  Filled 2021-11-11: qty 15

## 2021-11-11 NOTE — ED Provider Notes (Signed)
[facility] Provider Note    Event Date/Time   First MD Initiated Contact with Patient 11/11/21 2258     (approximate)   History   Chest Pain   HPI  Amanda Duke is a 81 y.o. female who presents to the ED for evaluation of Chest Pain   I reviewed cardiology clinic visit from 5/15.  History of paroxysmal SVT, HTN, CAD, HLD.  Patient presents to the ED for evaluation of left-sided chest discomfort.  She reports similar pain in the past with her large hiatal hernia, but is often positional and improved with left-sided positioning.  She reports typical pain for the past 20 hours that has been intermittent and positional, but does not always improve with left-sided positioning.  Due to this novel nature, she presents to the ED for evaluation.  Reports using her Protonix intermittently, perhaps every other day.  Denies any shortness of breath, fever, cough or exertional symptoms.  No increased lower extremity swelling  Physical Exam   Triage Vital Signs: ED Triage Vitals  Enc Vitals Group     BP 11/11/21 1845 (!) 156/98     Pulse Rate 11/11/21 1845 72     Resp 11/11/21 1845 20     Temp 11/11/21 1845 98 F (36.7 C)     Temp Source 11/11/21 1845 Oral     SpO2 11/11/21 1845 97 %     Weight 11/11/21 1842 173 lb (78.5 kg)     Height 11/11/21 1842 5' 4.5" (1.638 m)     Head Circumference --      Peak Flow --      Pain Score 11/11/21 1842 6     Pain Loc --      Pain Edu? --      Excl. in [place]? --     Most recent vital signs: Vitals:   11/12/21 0000 11/12/21 0030  BP: 126/61 129/66  Pulse: (!) 55 61  Resp: 16 14  Temp:    SpO2: 98% 97%    General: Awake, no distress.  Well-appearing, pleasant and conversational CV:  Good peripheral perfusion. RRR Resp:  Normal effort. CTAB Abd:  No distention. Soft and benign throughout MSK:  No deformity noted.  Neuro:  No focal deficits appreciated. Other:     ED Results / Procedures / Treatments    Labs (all labs ordered are listed, but only abnormal results are displayed) Labs Reviewed  BASIC METABOLIC PANEL - Abnormal; Notable for the following components:      Result Value   Creatinine, Ser 1.11 (*)    GFR, Estimated 50 (*)    All other components within normal limits  CBC - Abnormal; Notable for the following components:   MCH 25.6 (*)    RDW 16.4 (*)    All other components within normal limits  URINALYSIS, ROUTINE W REFLEX MICROSCOPIC - Abnormal; Notable for the following components:   Leukocytes,Ua SMALL (*)    All other components within normal limits  URINALYSIS, MICROSCOPIC (REFLEX)  TROPONIN I (HIGH SENSITIVITY)  TROPONIN I (HIGH SENSITIVITY)    EKG Sinus rhythm rate of 68 bpm, normal axis and intervals.  Fairly low amplitude.  Nonspecific ST changes to septal leads without STEMI.  No significant reciprocal changes. Similar EKG morphology as 1 year ago.  RADIOLOGY CXR interpreted by me without evidence of acute cardiopulmonary pathology.  Official radiology report(s): DG Chest 2 View  Result Date: 11/11/2021 CLINICAL DATA:  Chest chest pain, dyspnea EXAM:  CHEST - 2 VIEW COMPARISON:  None Available. FINDINGS: Lungs are clear. No pneumothorax or pleural effusion. Cardiac size is within normal limits. Large hiatal hernia. Pulmonary vascularity is normal. No acute bone abnormality. IMPRESSION: Large hiatal hernia. No radiographic evidence of acute cardiopulmonary disease. Electronically Signed   By: Fidela Salisbury M.D.   On: 11/11/2021 19:35    PROCEDURES and INTERVENTIONS:  .1-3 Lead EKG Interpretation  Performed by: Vladimir Crofts, MD Authorized by: Vladimir Crofts, MD     Interpretation: normal     ECG rate:  64   ECG rate assessment: normal     Rhythm: sinus rhythm     Ectopy: none     Conduction: normal     Medications  alum & mag hydroxide-simeth (MAALOX/MYLANTA) 200-200-20 MG/5ML suspension 30 mL (30 mLs Oral Given 11/11/21 2340)  lidocaine  (XYLOCAINE) 2 % viscous mouth solution 15 mL (15 mLs Mouth/Throat Given 11/11/21 2340)     IMPRESSION / MDM / ASSESSMENT AND PLAN / ED COURSE  I reviewed the triage vital signs and the nursing notes.  Differential diagnosis includes, but is not limited to, symptomatic hiatal hernia, GERD, esophagitis, ACS, pneumonia, PTX  {Patient presents with symptoms of an acute illness or injury that is potentially life-threatening.  81 year old female with history of coronary disease presents with atypical left-sided chest discomfort, likely symptomatic hiatal hernia ultimately suitable for outpatient management.  She looks systemically well and has no typical or exertional anginal symptoms.  Work-up is benign with nonischemic EKG, 2 negative troponins, clear CXR, essentially normal CBC and metabolic panel.  Urine without infectious features.  Symptoms resolved with GI cocktail.  We discussed the importance of adherence to her PPI on a daily basis and provide information for surgical follow-up.  We discussed return precautions and she is suitable for outpatient management.  Clinical Course as of 11/12/21 0034  Mon Nov 12, 2021  0029 Reassessed.  Feeling much better after the GI cocktail.  We discussed importance of adherence to Protonix, following up with general surgery as an outpatient and return precautions for the ED. [DS]    Clinical Course User Index [DS] Vladimir Crofts, MD     FINAL CLINICAL IMPRESSION(S) / ED DIAGNOSES   Final diagnoses:  Other chest pain  Large hiatal hernia     Rx / DC Orders   ED Discharge Orders     None        Note:  This document was prepared using Dragon voice recognition software and may include unintentional dictation errors.   Vladimir Crofts, MD 11/12/21 [phone]

## 2021-11-11 NOTE — ED Provider Triage Note (Signed)
Emergency Medicine Provider Triage Evaluation Note  Amanda Duke , a 81 y.o. female  was evaluated in triage.  Pt complains of chest pain, possible UTI.  Patient presents for chest pain that began this morning.  No cardiac history.  Seems to be substernal rating into the left chest.  No pleuritic pain.  Patient is also complaining of dysuria, polyuria and some back pain.  No nausea, vomiting, diarrhea..  Review of Systems  Positive: Chest pain, dysuria, polyuria Negative: Fevers, chills, emesis, diarrhea shortness of breath  Physical Exam  BP (!) 156/98 (BP Location: Left Arm)   Pulse 72   Temp 98 F (36.7 C) (Oral)   Resp 20   Ht 5' 4.5" (1.638 m)   Wt 78.5 kg   SpO2 97%   BMI 29.24 kg/m  Gen:   Awake, no distress   Resp:  Normal effort  MSK:   Moves extremities without difficulty  Other:    Medical Decision Making  Medically screening exam initiated at 6:48 PM.  Appropriate orders placed.  Amanda Duke was informed that the remainder of the evaluation will be completed by another provider, this initial triage assessment does not replace that evaluation, and the importance of remaining in the ED until their evaluation is complete.  Patient arrives with chest pain as well as urinary symptoms.  She will have EKG, chest x-ray, labs and urinalysis.   Amanda Moll, PA-C 11/11/21 1848

## 2021-11-11 NOTE — ED Triage Notes (Signed)
Pt via POV from home. Pt c/o mid- to sub-sternal chest pain that started midnight last night.  States it radiates to the L side of her chest. States that she is also had some mild SOB and dry cough. Denies any cardiac/lung diseases. States she has a hx of a hiatal hernia. Denies NVD. Denies fever. States she also been noticing urinary frequency and think she may have a UTI. Pt is A&OX4 and NAd

## 2021-11-11 NOTE — ED Provider Notes (Incomplete)
[facility] Provider Note    Event Date/Time   First MD Initiated Contact with Patient 11/11/21 2258     (approximate)   History   Chest Pain   HPI  Amanda Duke is a 81 y.o. female who presents to the ED for evaluation of Chest Pain   I reviewed cardiology clinic visit from 5/15.  History of paroxysmal SVT, HTN, CAD, HLD.  Patient presents to the ED for evaluation of left-sided chest discomfort.  She reports similar pain in the past with her large hiatal hernia, but is often positional and improved with left-sided positioning.  She reports typical pain for the past 20 hours that has been intermittent and positional, but does not always improve with left-sided positioning.  Due to this novel nature, she presents to the ED for evaluation.  Reports using her Protonix intermittently, perhaps every other day.  Denies any shortness of breath, fever, cough or exertional symptoms.  No increased lower extremity swelling  Physical Exam   Triage Vital Signs: ED Triage Vitals  Enc Vitals Group     BP 11/11/21 1845 (!) 156/98     Pulse Rate 11/11/21 1845 72     Resp 11/11/21 1845 20     Temp 11/11/21 1845 98 F (36.7 C)     Temp Source 11/11/21 1845 Oral     SpO2 11/11/21 1845 97 %     Weight 11/11/21 1842 173 lb (78.5 kg)     Height 11/11/21 1842 5' 4.5" (1.638 m)     Head Circumference --      Peak Flow --      Pain Score 11/11/21 1842 6     Pain Loc --      Pain Edu? --      Excl. in [place]? --     Most recent vital signs: Vitals:   11/11/21 2058 11/11/21 2329  BP: (!) 171/89 (!) 161/72  Pulse: 67 60  Resp: 20 17  Temp: 98.5 F (36.9 C)   SpO2: 100% 97%    General: Awake, no distress.  Well-appearing, pleasant and conversational CV:  Good peripheral perfusion. RRR Resp:  Normal effort. CTAB Abd:  No distention. Soft and benign throughout MSK:  No deformity noted.  Neuro:  No focal deficits appreciated. Other:     ED Results /  Procedures / Treatments   Labs (all labs ordered are listed, but only abnormal results are displayed) Labs Reviewed  BASIC METABOLIC PANEL - Abnormal; Notable for the following components:      Result Value   Creatinine, Ser 1.11 (*)    GFR, Estimated 50 (*)    All other components within normal limits  CBC - Abnormal; Notable for the following components:   MCH 25.6 (*)    RDW 16.4 (*)    All other components within normal limits  URINALYSIS, ROUTINE W REFLEX MICROSCOPIC - Abnormal; Notable for the following components:   Leukocytes,Ua SMALL (*)    All other components within normal limits  URINALYSIS, MICROSCOPIC (REFLEX)  TROPONIN I (HIGH SENSITIVITY)  TROPONIN I (HIGH SENSITIVITY)    EKG Sinus rhythm rate of 68 bpm, normal axis and intervals.  Fairly low amplitude.  Nonspecific ST changes to septal leads without STEMI.  No significant reciprocal changes. Similar EKG morphology as 1 year ago.  RADIOLOGY CXR interpreted by me without evidence of acute cardiopulmonary pathology.  Official radiology report(s): DG Chest 2 View  Result Date: 11/11/2021 CLINICAL DATA:  Chest  chest pain, dyspnea EXAM: CHEST - 2 VIEW COMPARISON:  None Available. FINDINGS: Lungs are clear. No pneumothorax or pleural effusion. Cardiac size is within normal limits. Large hiatal hernia. Pulmonary vascularity is normal. No acute bone abnormality. IMPRESSION: Large hiatal hernia. No radiographic evidence of acute cardiopulmonary disease. Electronically Signed   By: Fidela Salisbury M.D.   On: 11/11/2021 19:35    PROCEDURES and INTERVENTIONS:  .1-3 Lead EKG Interpretation  Performed by: Vladimir Crofts, MD Authorized by: Vladimir Crofts, MD     Interpretation: normal     ECG rate:  64   ECG rate assessment: normal     Rhythm: sinus rhythm     Ectopy: none     Conduction: normal     Medications  alum & mag hydroxide-simeth (MAALOX/MYLANTA) 200-200-20 MG/5ML suspension 30 mL (30 mLs Oral Given 11/11/21  2340)  lidocaine (XYLOCAINE) 2 % viscous mouth solution 15 mL (15 mLs Mouth/Throat Given 11/11/21 2340)     IMPRESSION / MDM / ASSESSMENT AND PLAN / ED COURSE  I reviewed the triage vital signs and the nursing notes.  Differential diagnosis includes, but is not limited to, symptomatic hiatal hernia, GERD, esophagitis, ACS, pneumonia, PTX  {Patient presents with symptoms of an acute illness or injury that is potentially life-threatening.        FINAL CLINICAL IMPRESSION(S) / ED DIAGNOSES   Final diagnoses:  Other chest pain  Large hiatal hernia     Rx / DC Orders   ED Discharge Orders     None        Note:  This document was prepared using Dragon voice recognition software and may include unintentional dictation errors.

## 2021-11-12 ENCOUNTER — Telehealth: Payer: Self-pay

## 2021-11-12 NOTE — Discharge Instructions (Signed)
Please reach out to the clinic of Dr. Christian Mate to schedule an appointment to discuss if it is worth repairing and what this would look like.  Please take your Protonix every day.

## 2021-11-12 NOTE — Telephone Encounter (Signed)
Attempted to schedule ED f/u, phone just rings. No answer or vm-Toni

## 2021-11-15 ENCOUNTER — Encounter: Payer: Self-pay | Admitting: Physician Assistant

## 2021-11-15 ENCOUNTER — Ambulatory Visit (INDEPENDENT_AMBULATORY_CARE_PROVIDER_SITE_OTHER): Payer: Medicaid Other | Admitting: Physician Assistant

## 2021-11-15 VITALS — BP 150/72 | HR 62 | Temp 98.2°F | Resp 16 | Ht 64.5 in | Wt 175.6 lb

## 2021-11-15 DIAGNOSIS — I10 Essential (primary) hypertension: Secondary | ICD-10-CM | POA: Diagnosis not present

## 2021-11-15 DIAGNOSIS — K449 Diaphragmatic hernia without obstruction or gangrene: Secondary | ICD-10-CM | POA: Diagnosis not present

## 2021-11-15 NOTE — Progress Notes (Signed)
[facility] [place], [place] [address]  Internal MEDICINE  Office Visit Note  Patient Name: Amanda Duke  [id number]  [id number]  Date of Service: 11/20/2021  Chief Complaint  Patient presents with   Hospitalization Follow-up   Gastroesophageal Reflux   Hypertension   Hyperlipidemia    HPI Pt is here for ED follow up -She went to the ED on 11/11/21 due to CP.  -She has a known hiatal hernia and has caused this pain in the past, but normally improves with positional change. She tried left-sided positioning this time, but continued to occur intermittently for almost an entire day therefore decided to go to ED for further evaluation -In ED she had nonischemic EKG, 2 neg troponin, clear CXR, and reassuring labs. She was given GI cocktail and her symptoms resolved. She was advised to take PPI daily rather than intermittently and was referred to surgery for evaluation of hiatal hernia as likely cause of pain. -Today she reports she has had some intermittent right side chest discomfort since ED visit, but seems greatly improved with PPI and left sided positioning. She denies current CP in office.  -She has appt with General surgery on the 31st. She had previously not wanted to go surgical route if possible, but given intermittent CP symptoms would like to pursue correction now. She will continue protonix in the meantime. Discussed return to ED if new or worsening symptoms occur -BP a little elevated in office, likely due to intermittent pain and will monitor  Current Medication: Outpatient Encounter Medications as of 11/15/2021  Medication Sig   acetaminophen (TYLENOL) 500 MG tablet Take 1,000 mg by mouth every 6 (six) hours as needed for moderate pain.   aspirin EC 81 MG tablet Take 81 mg by mouth 2 (two) times daily. Swallow whole.   chlorhexidine (PERIDEX) 0.12 % solution Use as directed 15 mLs in the mouth or throat 2 (two) times daily.   cholecalciferol  (VITAMIN D) 1000 units tablet Take 1,000 Units by mouth daily.   clonazePAM (KLONOPIN) 0.5 MG tablet Take 1 tablet by mouth as needed   diclofenac Sodium (VOLTAREN) 1 % GEL Apply 2 g topically 4 (four) times daily. (Patient taking differently: Apply 2 g topically 4 (four) times daily as needed (pain).)   ergocalciferol (DRISDOL) 1.25 MG (50000 UT) capsule Take 1 capsule (50,000 Units total) by mouth once a week.   ibuprofen (ADVIL) 600 MG tablet Take 1 tablet (600 mg total) by mouth 2 (two) times daily as needed. (Patient taking differently: Take 600 mg by mouth 2 (two) times daily as needed for moderate pain.)   icosapent Ethyl (VASCEPA) 1 g capsule Take 2 g by mouth 2 (two) times daily.   linaclotide (LINZESS) 290 MCG CAPS capsule Take 1 capsule (290 mcg total) by mouth daily.   losartan (COZAAR) 100 MG tablet Take 100 mg by mouth daily.   pantoprazole (PROTONIX) 40 MG tablet Take 40 mg by mouth in the morning.   rosuvastatin (CRESTOR) 40 MG tablet Take 40 mg by mouth at bedtime.   solifenacin (VESICARE) 5 MG tablet TAKE 1 TABLET (5 MG TOTAL) BY MOUTH DAILY.   carvedilol (COREG) 3.125 MG tablet Take 3.125 mg by mouth 2 (two) times daily with a meal.    diltiazem (CARDIZEM CD) 120 MG 24 hr capsule Take 120 mg by mouth daily.    No facility-administered encounter medications on file as of 11/15/2021.    Surgical History: Past Surgical History:  Procedure Laterality Date   APPENDECTOMY  1971   ARTHRODESIS  1980   BACK SURGERY  1980   COLONOSCOPY WITH PROPOFOL N/A 12/12/2016   Procedure: COLONOSCOPY WITH PROPOFOL;  Surgeon: Lollie Sails, MD;  Location: [facility] ENDOSCOPY;  Service: Endoscopy;  Laterality: N/A;   COLONOSCOPY WITH PROPOFOL N/A 07/14/2019   Procedure: COLONOSCOPY WITH PROPOFOL;  Surgeon: Toledo, Benay Pike, MD;  Location: [facility] ENDOSCOPY;  Service: Gastroenterology;  Laterality: N/A;   ESOPHAGOGASTRODUODENOSCOPY (EGD) WITH PROPOFOL N/A 12/12/2016   Procedure:  ESOPHAGOGASTRODUODENOSCOPY (EGD) WITH PROPOFOL;  Surgeon: Lollie Sails, MD;  Location: [facility] ENDOSCOPY;  Service: Endoscopy;  Laterality: N/A;   ESOPHAGOGASTRODUODENOSCOPY (EGD) WITH PROPOFOL N/A 07/14/2019   Procedure: ESOPHAGOGASTRODUODENOSCOPY (EGD) WITH PROPOFOL;  Surgeon: Toledo, Benay Pike, MD;  Location: [facility] ENDOSCOPY;  Service: Gastroenterology;  Laterality: N/A;   LAPAROSCOPIC UNILATERAL SALPINGO OOPHERECTOMY  1985   right   ORIF ANKLE FRACTURE Right    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    TONSILLECTOMY  1948   TUBAL LIGATION  1971   VAGINAL HYSTERECTOMY  1985   d/t heavy bleeding    Medical History: Past Medical History:  Diagnosis Date   Anxiety    Coronary artery disease    Gastritis    GERD (gastroesophageal reflux disease)    History of colonic polyps    Hyperlipemia    Hypertension    Irregular heart beat    Pneumonia    Pre-diabetes    Prediabetes    Redundant colon    Renal cyst    bilateral    Family History: Family History  Problem Relation Age of Onset   Heart failure Mother    Stroke Mother    Breast cancer Mother        dx at age 17 yo   Diabetes Mother    Osteoporosis Mother    Emphysema Father    Ovarian cancer Sister        dx at age 76 yo   Asthma Sister    Diabetes Daughter    Heart disease Daughter    COPD Daughter     Social History   Socioeconomic History   Marital status: Widowed    Spouse name: Not on file   Number of children: Not on file   Years of education: Not on file   Highest education level: Not on file  Occupational History   Not on file  Tobacco Use   Smoking status: Former   Smokeless tobacco: Never  Vaping Use   Vaping Use: Never used  Substance and Sexual Activity   Alcohol use: No    Alcohol/week: 0.0 standard drinks of alcohol   Drug use: Never   Sexual activity: Not Currently  Other Topics Concern   Not on file  Social History Narrative   Not on file   Social Determinants  of Health   Financial Resource Strain: Low Risk  (11/17/2020)   Overall Financial Resource Strain (CARDIA)    Difficulty of Paying Living Expenses: Not very hard  Food Insecurity: No Food Insecurity (01/10/2019)   Hunger Vital Sign    Worried About Running Out of Food in the Last Year: Never true    Ran Out of Food in the Last Year: Never true  Transportation Needs: No Transportation Needs (01/10/2019)   PRAPARE - Hydrologist (Medical): No    Lack of Transportation (Non-Medical): No  Physical Activity: Inactive (01/10/2019)   Exercise Vital Sign  Days of Exercise per Week: 0 days    Minutes of Exercise per Session: 0 min  Stress: No Stress Concern Present (01/10/2019)   [place]    Feeling of Stress : Not at all  Social Connections: Socially Isolated (01/10/2019)   Social Connection and Isolation Panel [NHANES]    Frequency of Communication with Friends and Family: Three times a week    Frequency of Social Gatherings with Friends and Family: Three times a week    Attends Religious Services: Never    Active Member of Clubs or Organizations: No    Attends Archivist Meetings: Never    Marital Status: Widowed  Intimate Partner Violence: Not At Risk (01/10/2019)   Humiliation, Afraid, Rape, and Kick questionnaire    Fear of Current or Ex-Partner: No    Emotionally Abused: No    Physically Abused: No    Sexually Abused: No      Review of Systems  Constitutional:  Negative for chills, fatigue and unexpected weight change.  HENT:  Negative for congestion, rhinorrhea, sneezing and sore throat.   Eyes:  Negative for redness.  Respiratory:  Negative for cough, chest tightness and shortness of breath.   Cardiovascular:  Negative for chest pain and palpitations.  Gastrointestinal:  Negative for abdominal pain, constipation, diarrhea, nausea and vomiting.       GERD  Genitourinary:   Negative for dysuria, frequency, hematuria and pelvic pain.       Incontinence  Musculoskeletal:  Positive for arthralgias. Negative for back pain, joint swelling and neck pain.  Skin:  Negative for rash.  Neurological: Negative.  Negative for tremors and numbness.  Hematological:  Negative for adenopathy. Does not bruise/bleed easily.  Psychiatric/Behavioral:  Negative for behavioral problems (Depression), sleep disturbance and suicidal ideas. The patient is not nervous/anxious.     Vital Signs: BP (!) 150/72 Comment: 162/76  Pulse 62   Temp 98.2 F (36.8 C)   Resp 16   Ht 5' 4.5" (1.638 m)   Wt 175 lb 9.6 oz (79.7 kg)   SpO2 98%   BMI 29.68 kg/m    Physical Exam Vitals and nursing note reviewed.  Constitutional:      General: She is not in acute distress.    Appearance: She is well-developed. She is not diaphoretic.  HENT:     Head: Normocephalic and atraumatic.     Mouth/Throat:     Pharynx: No oropharyngeal exudate.  Eyes:     Pupils: Pupils are equal, round, and reactive to light.  Neck:     Thyroid: No thyromegaly.     Vascular: No JVD.     Trachea: No tracheal deviation.  Cardiovascular:     Rate and Rhythm: Normal rate and regular rhythm.     Heart sounds: Normal heart sounds. No murmur heard.    No friction rub. No gallop.  Pulmonary:     Effort: Pulmonary effort is normal. No respiratory distress.     Breath sounds: No wheezing or rales.  Chest:     Chest wall: No tenderness.  Abdominal:     General: Bowel sounds are normal.     Palpations: Abdomen is soft.  Musculoskeletal:        General: Normal range of motion.     Cervical back: Normal range of motion and neck supple.  Lymphadenopathy:     Cervical: No cervical adenopathy.  Skin:    General: Skin is warm and dry.  Neurological:     Mental Status: She is alert and oriented to person, place, and time.     Cranial Nerves: No cranial nerve deficit.  Psychiatric:        Behavior: Behavior normal.         Thought Content: Thought content normal.        Judgment: Judgment normal.        Assessment/Plan: 1. Hiatal hernia Has appt with GS on 7/31, may continue protonix, but advised to return to ED if new or worsening symptoms arise  2. Benign essential HTN Slightly elevated in office, likely due to pain and anxiety, will monitor and continue current medications   General Counseling: Zuri verbalizes understanding of the findings of todays visit and agrees with plan of treatment. I have discussed any further diagnostic evaluation that may be needed or ordered today. We also reviewed her medications today. she has been encouraged to call the office with any questions or concerns that should arise related to todays visit.    No orders of the defined types were placed in this encounter.   No orders of the defined types were placed in this encounter.   This patient was seen by Drema Dallas, PA-C in collaboration with Dr. Clayborn Bigness as a part of collaborative care agreement.   Total time spent:30 Minutes Time spent includes review of chart, medications, test results, and follow up plan with the patient.      Dr Lavera Guise Internal medicine

## 2021-11-26 ENCOUNTER — Ambulatory Visit (INDEPENDENT_AMBULATORY_CARE_PROVIDER_SITE_OTHER): Payer: Medicare Other | Admitting: Surgery

## 2021-11-26 ENCOUNTER — Telehealth: Payer: Self-pay

## 2021-11-26 ENCOUNTER — Encounter: Payer: Self-pay | Admitting: Surgery

## 2021-11-26 ENCOUNTER — Other Ambulatory Visit: Payer: Self-pay

## 2021-11-26 VITALS — BP 144/91 | HR 71 | Temp 98.4°F | Ht 64.5 in | Wt 176.2 lb

## 2021-11-26 DIAGNOSIS — Z9889 Other specified postprocedural states: Secondary | ICD-10-CM | POA: Insufficient documentation

## 2021-11-26 DIAGNOSIS — Z90710 Acquired absence of both cervix and uterus: Secondary | ICD-10-CM | POA: Insufficient documentation

## 2021-11-26 DIAGNOSIS — I10 Essential (primary) hypertension: Secondary | ICD-10-CM | POA: Insufficient documentation

## 2021-11-26 DIAGNOSIS — K219 Gastro-esophageal reflux disease without esophagitis: Secondary | ICD-10-CM | POA: Diagnosis not present

## 2021-11-26 DIAGNOSIS — K449 Diaphragmatic hernia without obstruction or gangrene: Secondary | ICD-10-CM

## 2021-11-26 DIAGNOSIS — K432 Incisional hernia without obstruction or gangrene: Secondary | ICD-10-CM | POA: Diagnosis not present

## 2021-11-26 NOTE — Telephone Encounter (Signed)
Cardiac Clearance faxed to Dr.Bruce Kowalski at this time. 

## 2021-11-26 NOTE — Telephone Encounter (Signed)
Referral and notes faxed to Dr.Isami Sakai at this time. DR.Pabon also sent message regarding scheduling EGD.

## 2021-11-26 NOTE — Patient Instructions (Addendum)
CT scheduled [facility] 12/03/21 @ 9:30 am Please pick up prep kit and instructions today for the contrast. Nothing to eat/drink 4 hours prior to this scan. B  Barium swallow scheduled 12/10/21 @ 9:30 @ [place]. Please be sure to drink plenty of fluids to flush out the contrast.  Referral sent to [facility]. Someone from their office will call to schedule an appointment. However you may expedite the process if you call their office tomorrow to schedule an appointment.     Please call our office to schedule an appointment to discuss all your test results once you have completed all testing.   Cardiac clearance faxed to [facility]. Please call his office to see if you need to schedule an appointment for pre-op clearance.

## 2021-11-26 NOTE — Progress Notes (Signed)
Patient ID: Amanda Duke, female   DOB: [birth year], 81 y.o.   MRN: [id number]  HPI Amanda Duke is a 81 y.o. female seen for paraesophageal hernia.   She does have hx of paroxysmal supraventricular tachycardia, hypertension, coronary artery disease with two-vessel blockage, hyperlipidemia.Patient had a hip replacement in September 2022 for which she has done very well.  She does endorse regurgitation and cough She does reports some retrosternal discomfort after eating meals. She endorses some reflux as well, no dysphagia.   She Did have a CT scan of the abdomen pelvis 3 years ago and evidence of a large paraesophageal hernia type III.  She also had a recent chest x-ray showing a large hiatal hernia. She sees Dr. Nehemiah Massed and most recent visit was more than adequate She takes Daily Baby ASA CBC and BMP were normal except mild elevation of creatinine 1.1. Is able to perform more than 4 METS of activity without any shortness of breath or chest pain.  She does use a cane but is able to drive her own car.  She is independent.  She pays her own bills Surgical history includes hysterectomy with apparently bowel injury during the same operative setting more than 30 years ago.   HPI  Past Medical History:  Diagnosis Date   Anxiety    Coronary artery disease    Gastritis    GERD (gastroesophageal reflux disease)    History of colonic polyps    Hyperlipemia    Hypertension    Irregular heart beat    Pneumonia    Pre-diabetes    Prediabetes    Redundant colon    Renal cyst    bilateral    Past Surgical History:  Procedure Laterality Date   [place]   COLONOSCOPY WITH PROPOFOL N/A 12/12/2016   Procedure: COLONOSCOPY WITH PROPOFOL;  Surgeon: Lollie Sails, MD;  Location: [facility] ENDOSCOPY;  Service: Endoscopy;  Laterality: N/A;   COLONOSCOPY WITH PROPOFOL N/A 07/14/2019   Procedure: COLONOSCOPY WITH PROPOFOL;  Surgeon: Toledo,  Benay Pike, MD;  Location: [facility] ENDOSCOPY;  Service: Gastroenterology;  Laterality: N/A;   ESOPHAGOGASTRODUODENOSCOPY (EGD) WITH PROPOFOL N/A 12/12/2016   Procedure: ESOPHAGOGASTRODUODENOSCOPY (EGD) WITH PROPOFOL;  Surgeon: Lollie Sails, MD;  Location: [facility] ENDOSCOPY;  Service: Endoscopy;  Laterality: N/A;   ESOPHAGOGASTRODUODENOSCOPY (EGD) WITH PROPOFOL N/A 07/14/2019   Procedure: ESOPHAGOGASTRODUODENOSCOPY (EGD) WITH PROPOFOL;  Surgeon: Toledo, Benay Pike, MD;  Location: [facility] ENDOSCOPY;  Service: Gastroenterology;  Laterality: N/A;   LAPAROSCOPIC UNILATERAL SALPINGO OOPHERECTOMY  1985   right   ORIF ANKLE FRACTURE Right    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    TONSILLECTOMY  1948   TUBAL LIGATION  1971   VAGINAL HYSTERECTOMY  1985   d/t heavy bleeding    Family History  Problem Relation Age of Onset   Heart failure Mother    Stroke Mother    Breast cancer Mother        dx at age 85 yo   Diabetes Mother    Osteoporosis Mother    Emphysema Father    Ovarian cancer Sister        dx at age 14 yo   Asthma Sister    Diabetes Daughter    Heart disease Daughter    COPD Daughter     Social History Social History   Tobacco Use   Smoking status: Former   Smokeless tobacco: Never  Vaping Use   Vaping Use: Never used  Substance Use Topics   Alcohol use: No    Alcohol/week: 0.0 standard drinks of alcohol   Drug use: Never    Allergies  Allergen Reactions   Amoxicillin Hives    Patient tolerated Ceftriaxone in ED on 01/10/19.    Clindamycin Diarrhea   Gemfibrozil Other (See Comments)    muscle ache   Levofloxacin Other (See Comments)    Muscle ache for several months   Metoprolol Other (See Comments)    "Lowers blood pressure extremely low"   Simvastatin Other (See Comments)    muscle ache   Macrobid [Nitrofurantoin Macrocrystal] Diarrhea   Niacin Rash   Niacin And Related Rash   Nystatin Rash   Nystatin-Triamcinolone Rash   Sulfa Antibiotics  Rash    Family history of medication allergy    Current Outpatient Medications  Medication Sig Dispense Refill   acetaminophen (TYLENOL) 500 MG tablet Take 1,000 mg by mouth every 6 (six) hours as needed for moderate pain.     aspirin EC 81 MG tablet Take 81 mg by mouth 2 (two) times daily. Swallow whole.     chlorhexidine (PERIDEX) 0.12 % solution Use as directed 15 mLs in the mouth or throat 2 (two) times daily. 120 mL 0   cholecalciferol (VITAMIN D) 1000 units tablet Take 1,000 Units by mouth daily.     clonazePAM (KLONOPIN) 0.5 MG tablet Take 1 tablet by mouth as needed 30 tablet 1   diclofenac Sodium (VOLTAREN) 1 % GEL Apply 2 g topically 4 (four) times daily. (Patient taking differently: Apply 2 g topically 4 (four) times daily as needed (pain).) 100 g 2   ergocalciferol (DRISDOL) 1.25 MG (50000 UT) capsule Take 1 capsule (50,000 Units total) by mouth once a week. 4 capsule 2   ibuprofen (ADVIL) 600 MG tablet Take 1 tablet (600 mg total) by mouth 2 (two) times daily as needed. (Patient taking differently: Take 600 mg by mouth 2 (two) times daily as needed for moderate pain.) 45 tablet 0   icosapent Ethyl (VASCEPA) 1 g capsule Take 2 g by mouth 2 (two) times daily.     linaclotide (LINZESS) 290 MCG CAPS capsule Take 1 capsule (290 mcg total) by mouth daily. 30 capsule 3   losartan (COZAAR) 100 MG tablet Take 100 mg by mouth daily.     pantoprazole (PROTONIX) 40 MG tablet Take 40 mg by mouth in the morning.     rosuvastatin (CRESTOR) 40 MG tablet Take 40 mg by mouth at bedtime.     solifenacin (VESICARE) 5 MG tablet TAKE 1 TABLET (5 MG TOTAL) BY MOUTH DAILY. 90 tablet 1   carvedilol (COREG) 3.125 MG tablet Take 3.125 mg by mouth 2 (two) times daily with a meal.      diltiazem (CARDIZEM CD) 120 MG 24 hr capsule Take 120 mg by mouth daily.      No current facility-administered medications for this visit.     Review of Systems Full ROS  was asked and was negative except for the  information on the HPI  Physical Exam Blood pressure (!) 144/91, pulse 71, temperature 98.4 F (36.9 C), temperature source Oral, height 5' 4.5" (1.638 m), weight 176 lb 3.2 oz (79.9 kg), SpO2 98 %. CONSTITUTIONAL: NAD. EYES: Pupils are equal, round,  Sclera are non-icteric. EARS, NOSE, MOUTH AND THROAT:  The oral mucosa is pink and moist. Hearing is intact to voice. LYMPH NODES:  Lymph nodes in the  neck are normal. RESPIRATORY:  Lungs are clear. There is normal respiratory effort, with equal breath sounds bilaterally, and without pathologic use of accessory muscles. CARDIOVASCULAR: Heart is regular without murmurs, gallops, or rubs. GI: The abdomen is  soft, nontender, and nondistended. Evidence of a reducible incisional supraumbilical hernia 3 cm  there are no palpable masses. There is no hepatosplenomegaly. There are normal bowel sounds. Prior lower midline laparotomy scar  GU: Rectal deferred.   MUSCULOSKELETAL: Normal muscle strength and tone. No cyanosis or edema.   SKIN: Turgor is good and there are no pathologic skin lesions or ulcers. NEUROLOGIC: Motor and sensation is grossly normal. Cranial nerves are grossly intact. PSYCH:  Oriented to person, place and time. Affect is normal.  Data Reviewed  I have personally reviewed the patient's imaging, laboratory findings and medical records.    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  Discussed with patient in detail.  I do think that paraesophageal hernia will need to be repaired at some point in time.  We will need appropriate work-up including a repeat CT scan of the abdomen pelvis, barium swallow and a current EGD.  I will acetarsol to perform EGD to expedite work-up.  She does have additionally bilateral inguinal hernias not symptomatic at this point.  I do not plan on performing any repair for the inguinal hernia  since they are asymptomatic.  I do think that we can address the incisional hernia during the same operative setting. I Spent 60 minutes in this encounter including personally reviewing imaging studies, coordinating her care, counseling the patient and performing appropriate documentation  Caroleen Hamman, MD FACS General Surgeon 11/26/2021, 9:16 AM

## 2021-11-27 DIAGNOSIS — K449 Diaphragmatic hernia without obstruction or gangrene: Secondary | ICD-10-CM | POA: Diagnosis not present

## 2021-12-03 ENCOUNTER — Ambulatory Visit
Admission: RE | Admit: 2021-12-03 | Discharge: 2021-12-03 | Disposition: A | Discharge status: Home / Self Care | Payer: Medicare Other | Source: Ambulatory Visit | Attending: Surgery | Admitting: Surgery

## 2021-12-03 ENCOUNTER — Ambulatory Visit: Admission: RE | Admit: 2021-12-03 | Payer: Medicare Other | Source: Ambulatory Visit

## 2021-12-03 DIAGNOSIS — K219 Gastro-esophageal reflux disease without esophagitis: Secondary | ICD-10-CM | POA: Insufficient documentation

## 2021-12-03 DIAGNOSIS — R109 Unspecified abdominal pain: Secondary | ICD-10-CM | POA: Diagnosis not present

## 2021-12-03 DIAGNOSIS — K449 Diaphragmatic hernia without obstruction or gangrene: Secondary | ICD-10-CM | POA: Insufficient documentation

## 2021-12-03 DIAGNOSIS — I700 Atherosclerosis of aorta: Secondary | ICD-10-CM | POA: Diagnosis not present

## 2021-12-03 MED ORDER — IOHEXOL 300 MG/ML  SOLN
100.0000 mL | Freq: Once | INTRAMUSCULAR | Status: AC | PRN
  Administered 2021-12-03: 100 mL via INTRAVENOUS

## 2021-12-05 ENCOUNTER — Telehealth: Payer: Self-pay

## 2021-12-05 NOTE — Telephone Encounter (Signed)
Left detailed message stating CT showed Hiatal hernia per Dr.Pabon and reminded appointment.

## 2021-12-06 DIAGNOSIS — I10 Essential (primary) hypertension: Secondary | ICD-10-CM | POA: Diagnosis not present

## 2021-12-06 DIAGNOSIS — I700 Atherosclerosis of aorta: Secondary | ICD-10-CM | POA: Diagnosis not present

## 2021-12-06 DIAGNOSIS — I2510 Atherosclerotic heart disease of native coronary artery without angina pectoris: Secondary | ICD-10-CM | POA: Diagnosis not present

## 2021-12-06 DIAGNOSIS — E782 Mixed hyperlipidemia: Secondary | ICD-10-CM | POA: Diagnosis not present

## 2021-12-10 ENCOUNTER — Inpatient Hospital Stay: Admission: RE | Admit: 2021-12-10 | Payer: Medicare Other | Source: Ambulatory Visit

## 2021-12-10 ENCOUNTER — Telehealth: Payer: Self-pay

## 2021-12-10 NOTE — Telephone Encounter (Signed)
Received medical clearance from Dr. Nehemiah Massed. Pt's risk assessment is low and is optimized for surgery.

## 2021-12-17 ENCOUNTER — Other Ambulatory Visit: Payer: Medicare Other

## 2021-12-17 ENCOUNTER — Ambulatory Visit
Admission: RE | Admit: 2021-12-17 | Discharge: 2021-12-17 | Disposition: A | Discharge status: Home / Self Care | Payer: Medicare Other | Source: Ambulatory Visit | Attending: Surgery | Admitting: Surgery

## 2021-12-17 DIAGNOSIS — K449 Diaphragmatic hernia without obstruction or gangrene: Secondary | ICD-10-CM | POA: Diagnosis not present

## 2021-12-17 DIAGNOSIS — K219 Gastro-esophageal reflux disease without esophagitis: Secondary | ICD-10-CM | POA: Diagnosis not present

## 2021-12-18 ENCOUNTER — Telehealth: Payer: Self-pay

## 2021-12-18 ENCOUNTER — Inpatient Hospital Stay: Admission: RE | Admit: 2021-12-18 | Payer: Medicare Other | Source: Ambulatory Visit

## 2021-12-18 NOTE — Telephone Encounter (Signed)
Tried reaching patient , unsuccessful-phone just rang-no answer-letting patient know swallow study shows hiatal hernia. Keep scheduled appointment with Dr.Pabon.

## 2021-12-26 ENCOUNTER — Encounter: Payer: Self-pay | Admitting: Surgery

## 2021-12-27 ENCOUNTER — Encounter
Admission: RE | Disposition: A | Discharge status: Home / Self Care | Payer: Self-pay | Source: Ambulatory Visit | Attending: Surgery

## 2021-12-27 ENCOUNTER — Ambulatory Visit: Payer: Medicare Other | Admitting: Anesthesiology

## 2021-12-27 ENCOUNTER — Ambulatory Visit
Admission: RE | Admit: 2021-12-27 | Discharge: 2021-12-27 | Disposition: A | Discharge status: Home / Self Care | Payer: Medicare Other | Source: Ambulatory Visit | Attending: Surgery | Admitting: Surgery

## 2021-12-27 ENCOUNTER — Other Ambulatory Visit: Payer: Self-pay

## 2021-12-27 DIAGNOSIS — I10 Essential (primary) hypertension: Secondary | ICD-10-CM | POA: Insufficient documentation

## 2021-12-27 DIAGNOSIS — K449 Diaphragmatic hernia without obstruction or gangrene: Secondary | ICD-10-CM | POA: Insufficient documentation

## 2021-12-27 DIAGNOSIS — I2510 Atherosclerotic heart disease of native coronary artery without angina pectoris: Secondary | ICD-10-CM | POA: Diagnosis not present

## 2021-12-27 DIAGNOSIS — Z87891 Personal history of nicotine dependence: Secondary | ICD-10-CM | POA: Diagnosis not present

## 2021-12-27 DIAGNOSIS — K219 Gastro-esophageal reflux disease without esophagitis: Secondary | ICD-10-CM | POA: Diagnosis not present

## 2021-12-27 HISTORY — PX: ESOPHAGOGASTRODUODENOSCOPY: SHX5428

## 2021-12-27 SURGERY — EGD (ESOPHAGOGASTRODUODENOSCOPY)
Anesthesia: General | Site: Esophagus

## 2021-12-27 MED ORDER — PROPOFOL 10 MG/ML IV BOLUS
INTRAVENOUS | Status: DC | PRN
  Administered 2021-12-27: 30 mg via INTRAVENOUS

## 2021-12-27 MED ORDER — PROPOFOL 10 MG/ML IV BOLUS
INTRAVENOUS | Status: AC
  Filled 2021-12-27: qty 20

## 2021-12-27 MED ORDER — GLYCOPYRROLATE 0.2 MG/ML IJ SOLN
INTRAMUSCULAR | Status: DC | PRN
  Administered 2021-12-27: .2 mg via INTRAVENOUS

## 2021-12-27 MED ORDER — PROPOFOL 500 MG/50ML IV EMUL
INTRAVENOUS | Status: DC | PRN
  Administered 2021-12-27: 100 ug/kg/min via INTRAVENOUS

## 2021-12-27 MED ORDER — SODIUM CHLORIDE 0.9 % IV SOLN
INTRAVENOUS | Status: DC
Start: 2021-12-27 — End: 2021-12-27

## 2021-12-27 NOTE — Anesthesia Preprocedure Evaluation (Addendum)
Anesthesia Evaluation  Patient identified by MRN, date of birth, ID band Patient awake    Reviewed: Allergy & Precautions, NPO status , Patient's Chart, lab work & pertinent test results  Airway Mallampati: II  TM Distance: >3 FB Neck ROM: Full    Dental  (+) Partial Upper, Partial Lower   Pulmonary neg pulmonary ROS, Patient abstained from smoking., former smoker,    breath sounds clear to auscultation       Cardiovascular Exercise Tolerance: Good hypertension, Pt. on medications + CAD   Rhythm:Regular     Neuro/Psych Anxiety negative neurological ROS     GI/Hepatic Neg liver ROS, GERD  Medicated,  Endo/Other  negative endocrine ROS  Renal/GU   negative genitourinary   Musculoskeletal   Abdominal   Peds negative pediatric ROS (+)  Hematology negative hematology ROS (+)   Anesthesia Other Findings   Reproductive/Obstetrics                            Anesthesia Physical Anesthesia Plan  ASA: 3  Anesthesia Plan: General   Post-op Pain Management:    Induction: Intravenous  PONV Risk Score and Plan:   Airway Management Planned: Natural Airway  Additional Equipment:   Intra-op Plan:   Post-operative Plan:   Informed Consent: I have reviewed the patients History and Physical, chart, labs and discussed the procedure including the risks, benefits and alternatives for the proposed anesthesia with the patient or authorized representative who has indicated his/her understanding and acceptance.       Plan Discussed with: CRNA and Surgeon  Anesthesia Plan Comments:         Anesthesia Quick Evaluation

## 2021-12-27 NOTE — H&P (Signed)
Subjective:   CC: Hiatal hernia [K44.9]  HPI: Amanda Duke is a 81 y.o. female who was referred by [facility] for evaluation of above. EGD requested in preparation for hiatal hernia surgery. No major symptoms reported except for some fullness in chest area when laying down.  Past Medical History: has a past medical history of Anxiety, Coronary artery disease, Gastritis (12/12/2016), Hyperlipidemia, Hyperplastic colon polyp (12/12/2016), Hypertension, Redundant colon (12/12/2016), and Tubular adenoma of colon (12/12/2016).  Past Surgical History: has a past surgical history that includes Hysterectomy Vaginal; Ankle arthroscopy; back surgery; Colonoscopy (12/12/2016); egd (12/12/2016); Colonoscopy (07/14/2019); and egd (07/14/2019).  Family History: family history includes COPD in her paternal aunt; Heart failure in her mother; Heart murmur in her maternal aunt and paternal aunt; No Known Problems in her father.  Social History: reports that she has quit smoking. She has never used smokeless tobacco. She reports that she does not drink alcohol. No history on file for drug use.  Current Medications: has a current medication list which includes the following prescription(s): aspirin, carvedilol, clonazepam, diclofenac, diltiazem, ergocalciferol (vitamin d2), fluticasone propionate, linzess, losartan, pantoprazole, rosuvastatin, solifenacin, and vascepa.  Allergies:  Allergies  Allergen Reactions  Amoxicillin Hives  Clindamycin Hcl (Bulk) Unknown  Gemfibrozil Unknown  muscle ache  Levaquin [Levofloxacin] Unknown  Metoprolol Other (See Comments) and Unknown  Drastically reduces BP  Nystatin Rash  Simvastatin Unknown  muscle ache  Sulfa (Sulfonamide Antibiotics) Unknown  Niacin Rash  Nystatin-Triamcinolone Rash   ROS:  A 15 point review of systems was performed and pertinent positives and negatives noted in HPI  Objective:    BP (!) 156/86  Temp (!) 21.1 C (70 F)  Ht 165.1  cm ('5\' 5"'$ )  Wt 78.9 kg (174 lb)  BMI 28.96 kg/m   Constitutional : No distress, cooperative, alert  Lymphatics/Throat: Supple with no lymphadenopathy  Respiratory: Clear to auscultation bilaterally  Cardiovascular: Regular rate and rhythm  Gastrointestinal: Soft, non-tender, non-distended, no organomegaly.  Musculoskeletal: Steady gait and movement  Skin: Cool and moist  Psychiatric: Normal affect, non-agitated, not confused     LABS:  N/a   RADS: N/a  Assessment:    Hiatal hernia [K44.9]- will proceed with EGD  Plan:    1. Hiatal hernia [K44.9] R/b/a discussed. Risks include bleeding, perforation. Benefits include diagnostic, curative procedure if needed. Alternatives include continued observation. Pt verbalized understanding.  The patient verbalized understanding and all questions were answered to the patient's satisfaction.  2. Patient has elected to proceed with surgical treatment. Procedure will be scheduled.  labs/images/medications/previous chart entries reviewed personally and relevant changes/updates noted above.

## 2021-12-27 NOTE — Transfer of Care (Signed)
Immediate Anesthesia Transfer of Care Note  Patient: Amanda Duke  Procedure(s) Performed: ESOPHAGOGASTRODUODENOSCOPY (EGD) (Esophagus)  Patient Location: PACU  Anesthesia Type:General  Level of Consciousness: awake, alert  and oriented  Airway & Oxygen Therapy: Patient Spontanous Breathing and Patient connected to nasal cannula oxygen  Post-op Assessment: Report given to RN, Post -op Vital signs reviewed and stable and Patient moving all extremities  Post vital signs: Reviewed and stable  Last Vitals:  Vitals Value Taken Time  BP    Temp    Pulse    Resp    SpO2      Last Pain:  Vitals:   12/27/21 1241  PainSc: 2          Complications: No notable events documented.

## 2021-12-27 NOTE — Op Note (Signed)
[facility] Gastroenterology Patient Name: Amanda Duke Procedure Date: 12/27/2021 1:55 PM MRN: [id number] Account #: [id number] Date of Birth: 12/21/1940 Admit Type: Outpatient Age: 81 Room: [facility] ENDO ROOM 3 Gender: Female Note Status: Finalized Instrument Name: Upper Endoscope [id number] Procedure:             Upper GI endoscopy Indications:           Hiatal hernia Providers:             Eliseo Squires MD, MD Referring MD:          Mylinda Latina (Referring MD) Medicines:             Propofol per Anesthesia Complications:         No immediate complications. Procedure:             Pre-Anesthesia Assessment:                        - After reviewing the risks and benefits, the patient                         was deemed in satisfactory condition to undergo the                         procedure in an ambulatory setting.                        After obtaining informed consent, the endoscope was                         passed under direct vision. Throughout the procedure,                         the patient's blood pressure, pulse, and oxygen                         saturations were monitored continuously. The Endoscope                         was introduced through the mouth, and advanced to the                         second part of duodenum. The upper GI endoscopy was                         accomplished with ease. The patient tolerated the                         procedure well. Findings:      The esophagus was normal.      The examined duodenum was normal.      A hiatal hernia was present. Impression:            - Normal esophagus.                        - Normal examined duodenum.                        - No specimens collected.                        -  Large hernia. Recommendation:        - Discharge patient to home.                        - Resume previous diet.                        - Written discharge instructions were provided to the                          patient. Procedure Code(s):     --- Professional ---                        [phone], Esophagogastroduodenoscopy, flexible,                         transoral; diagnostic, including collection of                         specimen(s) by brushing or washing, when performed                         (separate procedure) CPT copyright 2019 [employer]. All rights reserved. The codes documented in this report are preliminary and upon coder review may  be revised to meet current compliance requirements. Dr. Sheppard Penton, MD Eliseo Squires MD, MD 12/27/2021 2:29:19 PM This report has been signed electronically. Number of Addenda: 0 Note Initiated On: 12/27/2021 1:55 PM Estimated Blood Loss:  Estimated blood loss: none. Estimated blood loss: none.      [employer]

## 2021-12-28 ENCOUNTER — Encounter: Payer: Self-pay | Admitting: Surgery

## 2021-12-28 NOTE — Anesthesia Postprocedure Evaluation (Signed)
Anesthesia Post Note  Patient: Amanda Duke  Procedure(s) Performed: ESOPHAGOGASTRODUODENOSCOPY (EGD) (Esophagus)  Patient location during evaluation: PACU Anesthesia Type: General Level of consciousness: awake and awake and alert Pain management: pain level controlled Vital Signs Assessment: post-procedure vital signs reviewed and stable Respiratory status: spontaneous breathing and respiratory function stable Cardiovascular status: stable Anesthetic complications: no   No notable events documented.   Last Vitals:  Vitals:   12/27/21 1440 12/27/21 1449  BP: 130/88 135/80  Pulse: 71 66  Resp: (!) 25 14  Temp:    SpO2: 99% 99%    Last Pain:  Vitals:   12/27/21 1449  TempSrc:   PainSc: 0-No pain                 VAN STAVEREN,Bianey Tesoro

## 2022-01-07 ENCOUNTER — Ambulatory Visit (INDEPENDENT_AMBULATORY_CARE_PROVIDER_SITE_OTHER): Payer: Medicare Other | Admitting: Surgery

## 2022-01-07 ENCOUNTER — Encounter: Payer: Self-pay | Admitting: Surgery

## 2022-01-07 VITALS — BP 156/76 | HR 73 | Temp 97.9°F | Wt 172.8 lb

## 2022-01-07 DIAGNOSIS — K219 Gastro-esophageal reflux disease without esophagitis: Secondary | ICD-10-CM | POA: Diagnosis not present

## 2022-01-07 DIAGNOSIS — K449 Diaphragmatic hernia without obstruction or gangrene: Secondary | ICD-10-CM

## 2022-01-07 DIAGNOSIS — K432 Incisional hernia without obstruction or gangrene: Secondary | ICD-10-CM

## 2022-01-07 NOTE — Progress Notes (Signed)
Outpatient Surgical Follow Up  01/07/2022  Amanda Duke is an 81 y.o. female.   Chief Complaint  Patient presents with   Follow-up    Hiatal hernia    HPI: Amanda Duke is a 81 y.o. female following up for paraesophageal hernia.  She does have hx of paroxysmal supraventricular tachycardia, hypertension, coronary artery disease with two-vessel blockage, hyperlipidemia.Patient had a hip replacement in September 2022 for which she has done very well.  She does endorse regurgitation and cough She does reports some retrosternal discomfort after eating meals. She endorses some reflux as well, no dysphagia.   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  and umbilical hernia She saw Dr. Nehemiah Massed and was deemed a reasonable perioperative risk. More recently her incisional hernia is giving her more trouble and pain. She takes Daily Baby ASA CBC and BMP were normal except mild elevation of creatinine 1.1. She Is able to perform more than 4 METS of activity without any shortness of breath or chest pain.  She does use a cane but is able to drive her own car.  She is independent.  She pays her own bills Surgical history includes hysterectomy with apparently bowel injury during the same operative setting more than 30 years ago.    Past Medical History:  Diagnosis Date   Anxiety    Coronary artery disease    Gastritis    GERD (gastroesophageal reflux disease)    History of colonic polyps    Hyperlipemia    Hypertension    Irregular heart beat    Pneumonia    Pre-diabetes    Prediabetes    Redundant colon    Renal cyst    bilateral    Past Surgical History:  Procedure Laterality Date   [place]   COLONOSCOPY WITH PROPOFOL N/A 12/12/2016   Procedure: COLONOSCOPY WITH PROPOFOL;  Surgeon: Lollie Sails, MD;   Location: [facility] ENDOSCOPY;  Service: Endoscopy;  Laterality: N/A;   COLONOSCOPY WITH PROPOFOL N/A 07/14/2019   Procedure: COLONOSCOPY WITH PROPOFOL;  Surgeon: Toledo, Benay Pike, MD;  Location: [facility] ENDOSCOPY;  Service: Gastroenterology;  Laterality: N/A;   ESOPHAGOGASTRODUODENOSCOPY N/A 12/27/2021   Procedure: ESOPHAGOGASTRODUODENOSCOPY (EGD);  Surgeon: Benjamine Sprague, DO;  Location: [facility] ENDOSCOPY;  Service: General;  Laterality: N/A;   ESOPHAGOGASTRODUODENOSCOPY (EGD) WITH PROPOFOL N/A 12/12/2016   Procedure: ESOPHAGOGASTRODUODENOSCOPY (EGD) WITH PROPOFOL;  Surgeon: Lollie Sails, MD;  Location: [facility] ENDOSCOPY;  Service: Endoscopy;  Laterality: N/A;   ESOPHAGOGASTRODUODENOSCOPY (EGD) WITH PROPOFOL N/A 07/14/2019   Procedure: ESOPHAGOGASTRODUODENOSCOPY (EGD) WITH PROPOFOL;  Surgeon: Toledo, Benay Pike, MD;  Location: [facility] ENDOSCOPY;  Service: Gastroenterology;  Laterality: N/A;   LAPAROSCOPIC UNILATERAL SALPINGO OOPHERECTOMY  1985   right   ORIF ANKLE FRACTURE Right    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    TONSILLECTOMY  1948   TUBAL LIGATION  1971   VAGINAL HYSTERECTOMY  1985   d/t heavy bleeding    Family History  Problem Relation Age of Onset   Heart failure Mother    Stroke Mother    Breast cancer Mother        dx at age 72 yo   Diabetes Mother    Osteoporosis Mother    Emphysema Father    Ovarian cancer Sister  dx at age 79 yo   Asthma Sister    Diabetes Daughter    Heart disease Daughter    COPD Daughter     Social History:  reports that she has quit smoking. She has never used smokeless tobacco. She reports that she does not drink alcohol and does not use drugs.  Allergies:  Allergies  Allergen Reactions   Amoxicillin Hives    Patient tolerated Ceftriaxone in ED on 01/10/19.    Clindamycin Diarrhea   Gemfibrozil Other (See Comments)    muscle ache   Levofloxacin Other (See Comments)    Muscle ache for several months   Metoprolol Other  (See Comments)    "Lowers blood pressure extremely low"   Simvastatin Other (See Comments)    muscle ache   Macrobid [Nitrofurantoin Macrocrystal] Diarrhea   Niacin Rash   Niacin And Related Rash   Nystatin Rash   Nystatin-Triamcinolone Rash   Sulfa Antibiotics Rash    Family history of medication allergy    Medications reviewed.    ROS Full ROS performed and is otherwise negative other than what is stated in HPI   BP (!) 156/76   Pulse 73   Temp 97.9 F (36.6 C) (Oral)   Wt 172 lb 12.8 oz (78.4 kg)   SpO2 98%   BMI 29.66 kg/m   Physical Exam CONSTITUTIONAL: NAD. EYES: Pupils are equal, round,  Sclera are non-icteric. EARS, NOSE, MOUTH AND THROAT:  The oral mucosa is pink and moist. Hearing is intact to voice. LYMPH NODES:  Lymph nodes in the neck are normal. RESPIRATORY:  Lungs are clear. There is normal respiratory effort, with equal breath sounds bilaterally, and without pathologic use of accessory muscles. CARDIOVASCULAR: Heart is regular without murmurs, gallops, or rubs. GI: The abdomen is  soft,  and nondistended. Evidence of a reducible incisional supraumbilical hernia 3 cm  it is tender to palpation. There is no hepatosplenomegaly. There are normal bowel sounds. Prior lower midline laparotomy scar  GU: Rectal deferred.   MUSCULOSKELETAL: Normal muscle strength and tone. No cyanosis or edema.   SKIN: Turgor is good and there are no pathologic skin lesions or ulcers. NEUROLOGIC: Motor and sensation is grossly normal. Cranial nerves are grossly intact. PSYCH:  Oriented to person, place and time. Affect is normal.    Assessment/Plan: 81 year old very functional female with a large paraesophageal hernia type III in addition to an incisional hernia in the supraumbilical region, they are both symptomatic.  Discussed with patient in detail.  She is now starting to have crescendo symptoms from the incisional hernia repair and wishes to move forward with repair of both  paraesophageal incisional hernia. She has completed appropriate work-up.  We will perform robotic paraesophageal hernia repair with Nissen fundoplication and incisional hernia repair.  she does have additionally bilateral inguinal hernias not symptomatic at this point.  I do not plan on performing any repair for the inguinal hernia since they are asymptomatic.  Procedure discussed with patient detail.  Risk, benefits and possible implications including but not limited to: Bleeding, infection, recurrence, chronic pain, pneumothorax esophageal injuries and bowel injuries, chronic bloating.  We also discussed about diet modification and postoperative course. I Spent 40 minutes in this encounter including personally reviewing imaging studies, coordinating her care, counseling the patient and performing appropriate documentation     Caroleen Hamman, MD [place] Surgeon

## 2022-01-07 NOTE — Patient Instructions (Addendum)
Our surgery scheduler Pamala Hurry will call you within 24-48 hours to get you scheduled. If you have not heard from her after 48 hours, please call our office. Have the blue sheet available when she calls to write down important information.  If you have any concerns or questions, please feel free to call our office.   Hiatal Hernia  A hiatal hernia occurs when part of the stomach slides above the muscle that separates the abdomen from the chest (diaphragm). A person can be born with a hiatal hernia (congenital), or it may develop over time. In almost all cases of hiatal hernia, only the top part of the stomach pushes through the diaphragm. Many people have a hiatal hernia with no symptoms. The larger the hernia, the more likely it is that you will have symptoms. In some cases, a hiatal hernia allows stomach acid to flow back into the tube that carries food from your mouth to your stomach (esophagus). This may cause heartburn symptoms. The development of heartburn symptoms may mean that you have a condition called gastroesophageal reflux disease (GERD). What are the causes? This condition is caused by a weakness in the opening (hiatus) where the esophagus passes through the diaphragm to attach to the upper part of the stomach. A person may be born with a weakness in the hiatus, or a weakness can develop over time. What increases the risk? This condition is more likely to develop in: Older people. Age is a major risk factor for a hiatal hernia, especially if you are over the age of 1. Pregnant women. People who are overweight. People who have frequent constipation. What are the signs or symptoms? Symptoms of this condition usually develop in the form of GERD symptoms. Symptoms include: Heartburn. Upset stomach (indigestion). Trouble swallowing. Coughing or wheezing. Wheezing is making high-pitched whistling sounds when you breathe. Sore throat. Chest pain. Nausea and vomiting. How is this  diagnosed? This condition may be diagnosed during testing for GERD. Tests that may be done include: X-rays of your stomach or chest. An upper gastrointestinal (GI) series. This is an X-ray exam of your GI tract that is taken after you swallow a chalky liquid that shows up clearly on the X-ray. Endoscopy. This is a procedure to look into your stomach using a thin, flexible tube that has a tiny camera and light on the end of it. How is this treated? This condition may be treated by: Dietary and lifestyle changes to help reduce GERD symptoms. Medicines. These may include: Over-the-counter antacids. Medicines that make your stomach empty more quickly. Medicines that block the production of stomach acid (H2 blockers). Stronger medicines to reduce stomach acid (proton pump inhibitors). Surgery to repair the hernia, if other treatments are not helping. If you have no symptoms, you may not need treatment. Follow these instructions at home: Lifestyle and activity Do not use any products that contain nicotine or tobacco. These products include cigarettes, chewing tobacco, and vaping devices, such as e-cigarettes. If you need help quitting, ask your health care provider. Try to achieve and maintain a healthy body weight. Avoid putting pressure on your abdomen. Anything that puts pressure on your abdomen increases the amount of acid that may be pushed up into your esophagus. Avoid bending over, especially after eating. Raise the head of your bed by putting blocks under the legs. This keeps your head and esophagus higher than your stomach. Do not wear tight clothing around your chest or stomach. Try not to strain when having a  bowel movement, when urinating, or when lifting heavy objects. Eating and drinking Avoid foods that can worsen GERD symptoms. These may include: Fatty foods, like fried foods. Citrus fruits, like oranges or lemon. Other foods and drinks that contain acid, like orange juice or  tomatoes. Spicy food. Chocolate. Eat frequent small meals instead of three large meals a day. This helps prevent your stomach from getting too full. Eat slowly. Do not lie down right after eating. Do not eat 1-2 hours before bed. Do not drink beverages with caffeine. These include cola, coffee, cocoa, and tea. Do not drink alcohol. General instructions Take over-the-counter and prescription medicines only as told by your health care provider. Keep all follow-up visits. Your health care provider will want to check that any new prescribed medicines are helping your symptoms. Contact a health care provider if: Your symptoms are not controlled with medicines or lifestyle changes. You are having trouble swallowing. You have coughing or wheezing that will not go away. Your pain is getting worse. Your pain spreads to your arms, neck, jaw, teeth, or back. You feel nauseous or you vomit. Get help right away if: You have shortness of breath. You vomit blood. You have bright red blood in your stools. You have black, tarry stools. These symptoms may be an emergency. Get help right away. Call 911. Do not wait to see if the symptoms will go away. Do not drive yourself to the hospital. Summary A hiatal hernia occurs when part of the stomach slides above the muscle that separates the abdomen from the chest. A person may be born with a weakness in the hiatus, or a weakness can develop over time. Symptoms of a hiatal hernia may include heartburn, trouble swallowing, or sore throat. Management of a hiatal hernia includes eating frequent small meals instead of three large meals a day. Get help right away if you vomit blood, have bright red blood in your stools, or have black, tarry stools. This information is not intended to replace advice given to you by your health care provider. Make sure you discuss any questions you have with your health care provider. Document Revised: 06/12/2021 Document  Reviewed: 06/12/2021 Elsevier Patient Education  [place].

## 2022-01-07 NOTE — H&P (View-Only) (Signed)
Outpatient Surgical Follow Up  01/07/2022  Amanda Duke is an 81 y.o. female.   Chief Complaint  Patient presents with   Follow-up    Hiatal hernia    HPI: Amanda Duke is a 81 y.o. female following up for paraesophageal hernia.  She does have hx of paroxysmal supraventricular tachycardia, hypertension, coronary artery disease with two-vessel blockage, hyperlipidemia.Patient had a hip replacement in September 2022 for which she has done very well.  She does endorse regurgitation and cough She does reports some retrosternal discomfort after eating meals. She endorses some reflux as well, no dysphagia.   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  and umbilical hernia She saw Dr. Nehemiah Massed and was deemed a reasonable perioperative risk. More recently her incisional hernia is giving her more trouble and pain. She takes Daily Baby ASA CBC and BMP were normal except mild elevation of creatinine 1.1. She Is able to perform more than 4 METS of activity without any shortness of breath or chest pain.  She does use a cane but is able to drive her own car.  She is independent.  She pays her own bills Surgical history includes hysterectomy with apparently bowel injury during the same operative setting more than 30 years ago.    Past Medical History:  Diagnosis Date   Anxiety    Coronary artery disease    Gastritis    GERD (gastroesophageal reflux disease)    History of colonic polyps    Hyperlipemia    Hypertension    Irregular heart beat    Pneumonia    Pre-diabetes    Prediabetes    Redundant colon    Renal cyst    bilateral    Past Surgical History:  Procedure Laterality Date   [place]   COLONOSCOPY WITH PROPOFOL N/A 12/12/2016   Procedure: COLONOSCOPY WITH PROPOFOL;  Surgeon: Lollie Sails, MD;   Location: [facility] ENDOSCOPY;  Service: Endoscopy;  Laterality: N/A;   COLONOSCOPY WITH PROPOFOL N/A 07/14/2019   Procedure: COLONOSCOPY WITH PROPOFOL;  Surgeon: Toledo, Benay Pike, MD;  Location: [facility] ENDOSCOPY;  Service: Gastroenterology;  Laterality: N/A;   ESOPHAGOGASTRODUODENOSCOPY N/A 12/27/2021   Procedure: ESOPHAGOGASTRODUODENOSCOPY (EGD);  Surgeon: Benjamine Sprague, DO;  Location: [facility] ENDOSCOPY;  Service: General;  Laterality: N/A;   ESOPHAGOGASTRODUODENOSCOPY (EGD) WITH PROPOFOL N/A 12/12/2016   Procedure: ESOPHAGOGASTRODUODENOSCOPY (EGD) WITH PROPOFOL;  Surgeon: Lollie Sails, MD;  Location: [facility] ENDOSCOPY;  Service: Endoscopy;  Laterality: N/A;   ESOPHAGOGASTRODUODENOSCOPY (EGD) WITH PROPOFOL N/A 07/14/2019   Procedure: ESOPHAGOGASTRODUODENOSCOPY (EGD) WITH PROPOFOL;  Surgeon: Toledo, Benay Pike, MD;  Location: [facility] ENDOSCOPY;  Service: Gastroenterology;  Laterality: N/A;   LAPAROSCOPIC UNILATERAL SALPINGO OOPHERECTOMY  1985   right   ORIF ANKLE FRACTURE Right    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    TONSILLECTOMY  1948   TUBAL LIGATION  1971   VAGINAL HYSTERECTOMY  1985   d/t heavy bleeding    Family History  Problem Relation Age of Onset   Heart failure Mother    Stroke Mother    Breast cancer Mother        dx at age 87 yo   Diabetes Mother    Osteoporosis Mother    Emphysema Father    Ovarian cancer Sister  dx at age 50 yo   Asthma Sister    Diabetes Daughter    Heart disease Daughter    COPD Daughter     Social History:  reports that she has quit smoking. She has never used smokeless tobacco. She reports that she does not drink alcohol and does not use drugs.  Allergies:  Allergies  Allergen Reactions   Amoxicillin Hives    Patient tolerated Ceftriaxone in ED on 01/10/19.    Clindamycin Diarrhea   Gemfibrozil Other (See Comments)    muscle ache   Levofloxacin Other (See Comments)    Muscle ache for several months   Metoprolol Other  (See Comments)    "Lowers blood pressure extremely low"   Simvastatin Other (See Comments)    muscle ache   Macrobid [Nitrofurantoin Macrocrystal] Diarrhea   Niacin Rash   Niacin And Related Rash   Nystatin Rash   Nystatin-Triamcinolone Rash   Sulfa Antibiotics Rash    Family history of medication allergy    Medications reviewed.    ROS Full ROS performed and is otherwise negative other than what is stated in HPI   BP (!) 156/76   Pulse 73   Temp 97.9 F (36.6 C) (Oral)   Wt 172 lb 12.8 oz (78.4 kg)   SpO2 98%   BMI 29.66 kg/m   Physical Exam CONSTITUTIONAL: NAD. EYES: Pupils are equal, round,  Sclera are non-icteric. EARS, NOSE, MOUTH AND THROAT:  The oral mucosa is pink and moist. Hearing is intact to voice. LYMPH NODES:  Lymph nodes in the neck are normal. RESPIRATORY:  Lungs are clear. There is normal respiratory effort, with equal breath sounds bilaterally, and without pathologic use of accessory muscles. CARDIOVASCULAR: Heart is regular without murmurs, gallops, or rubs. GI: The abdomen is  soft,  and nondistended. Evidence of a reducible incisional supraumbilical hernia 3 cm  it is tender to palpation. There is no hepatosplenomegaly. There are normal bowel sounds. Prior lower midline laparotomy scar  GU: Rectal deferred.   MUSCULOSKELETAL: Normal muscle strength and tone. No cyanosis or edema.   SKIN: Turgor is good and there are no pathologic skin lesions or ulcers. NEUROLOGIC: Motor and sensation is grossly normal. Cranial nerves are grossly intact. PSYCH:  Oriented to person, place and time. Affect is normal.    Assessment/Plan: 81 year old very functional female with a large paraesophageal hernia type III in addition to an incisional hernia in the supraumbilical region, they are both symptomatic.  Discussed with patient in detail.  She is now starting to have crescendo symptoms from the incisional hernia repair and wishes to move forward with repair of both  paraesophageal incisional hernia. She has completed appropriate work-up.  We will perform robotic paraesophageal hernia repair with Nissen fundoplication and incisional hernia repair.  she does have additionally bilateral inguinal hernias not symptomatic at this point.  I do not plan on performing any repair for the inguinal hernia since they are asymptomatic.  Procedure discussed with patient detail.  Risk, benefits and possible implications including but not limited to: Bleeding, infection, recurrence, chronic pain, pneumothorax esophageal injuries and bowel injuries, chronic bloating.  We also discussed about diet modification and postoperative course. I Spent 40 minutes in this encounter including personally reviewing imaging studies, coordinating her care, counseling the patient and performing appropriate documentation     Caroleen Hamman, MD [place] Surgeon

## 2022-01-08 ENCOUNTER — Telehealth: Payer: Self-pay | Admitting: Surgery

## 2022-01-08 NOTE — Telephone Encounter (Signed)
Patient has been advised of Pre-Admission date/time, and Surgery date at [facility].  Surgery Date: 01/15/22 Preadmission Testing Date: 01/10/22 (phone 8a-1p)  Patient has been made aware to call [phone], between 1-3:00pm the day before surgery, to find out what time to arrive for surgery.

## 2022-01-10 ENCOUNTER — Encounter
Admission: RE | Admit: 2022-01-10 | Discharge: 2022-01-10 | Disposition: A | Discharge status: Home / Self Care | Payer: Medicare Other | Source: Ambulatory Visit | Attending: Surgery | Admitting: Surgery

## 2022-01-10 DIAGNOSIS — Z20822 Contact with and (suspected) exposure to covid-19: Secondary | ICD-10-CM

## 2022-01-10 HISTORY — DX: Unspecified osteoarthritis, unspecified site: M19.90

## 2022-01-10 HISTORY — DX: Personal history of other diseases of the digestive system: Z87.19

## 2022-01-10 HISTORY — DX: Other specified postprocedural states: Z98.890

## 2022-01-10 NOTE — Patient Instructions (Addendum)
Your procedure is scheduled on:01-15-22 Tuesday Report to the Registration Desk on the 1st floor of the [facility].Then proceed to the 2nd floor Surgery Desk To find out your arrival time, please call [phone] between 1PM - 3PM on:01-14-22 Monday If your arrival time is 6:00 am, do not arrive prior to that time as the [facility] entrance doors do not open until 6:00 am.  REMEMBER: Instructions that are not followed completely may result in serious medical risk, up to and including death; or upon the discretion of your surgeon and anesthesiologist your surgery may need to be rescheduled.  Do not eat food after midnight the night before surgery.  No gum chewing, lozengers or hard candies.  You may however, drink CLEAR liquids up to 2 hours before you are scheduled to arrive for your surgery. Do not drink anything within 2 hours of your scheduled arrival time.  Clear liquids include: - water  - apple juice without pulp - gatorade (not RED colors) - black coffee or tea (Do NOT add milk or creamers to the coffee or tea) Do NOT drink anything that is not on this list.  TAKE THESE MEDICATIONS THE MORNING OF SURGERY WITH A SIP OF WATER: -carvedilol (COREG)  -diltiazem (CARDIZEM CD) -pantoprazole (PROTONIX)-take one the night before and one on the morning of surgery - helps to prevent nausea after surgery.) -You may take clonazePAM (KLONOPIN) the day of surgery if needed  Stop your 81 mg Aspirin 5 days prior to surgery-Last dose was on 01-09-22 as instructed by Dr Barb Merino office  One week prior to surgery: Stop Anti-inflammatories (NSAIDS) such as Advil, Aleve, Ibuprofen, Motrin, Naproxen, Naprosyn and Aspirin based products such as Excedrin, Goodys Powder, BC Powder.You may however, take Tylenol if needed for pain up until the day of surgery.  Stop ANY OVER THE COUNTER supplements/vitamins NOW (01-10-22) until after surgery (Vitamin D)  No Alcohol for 24 hours before or after  surgery.  No Smoking including e-cigarettes for 24 hours prior to surgery.  No chewable tobacco products for at least 6 hours prior to surgery.  No nicotine patches on the day of surgery.  Do not use any "recreational" drugs for at least a week prior to your surgery.  Please be advised that the combination of cocaine and anesthesia may have negative outcomes, up to and including death. If you test positive for cocaine, your surgery will be cancelled.  On the morning of surgery brush your teeth with toothpaste and water, you may rinse your mouth with mouthwash if you wish. Do not swallow any toothpaste or mouthwash.  Use CHG Soap as directed on instruction sheet.  Do not wear jewelry, make-up, hairpins, clips or nail polish.  Do not wear lotions, powders, or perfumes.   Do not shave body from the neck down 48 hours prior to surgery just in case you cut yourself which could leave a site for infection.  Also, freshly shaved skin may become irritated if using the CHG soap.  Contact lenses, hearing aids and dentures may not be worn into surgery.  Do not bring valuables to the hospital. [facility] is not responsible for any missing/lost belongings or valuables.   Notify your doctor if there is any change in your medical condition (cold, fever, infection).  Wear comfortable clothing (specific to your surgery type) to the hospital.  After surgery, you can help prevent lung complications by doing breathing exercises.  Take deep breaths and cough every 1-2 hours. Your doctor may  order a device called an Incentive Spirometer to help you take deep breaths. When coughing or sneezing, hold a pillow firmly against your incision with both hands. This is called "splinting." Doing this helps protect your incision. It also decreases belly discomfort.  If you are being admitted to the hospital overnight, leave your suitcase in the car. After surgery it may be brought to your room.  If you are being  discharged the day of surgery, you will not be allowed to drive home. You will need a responsible adult (18 years or older) to drive you home and stay with you that night.   If you are taking public transportation, you will need to have a responsible adult (18 years or older) with you. Please confirm with your physician that it is acceptable to use public transportation.   Please call the [facility] Dept. at [phone] if you have any questions about these instructions.  Surgery Visitation Policy:  Patients undergoing a surgery or procedure may have two family members or support persons with them as long as the person is not COVID-19 positive or experiencing its symptoms.   Inpatient Visitation:    Visiting hours are 7 a.m. to 8 p.m. Up to four visitors are allowed at one time in a patient room, including children. The visitors may rotate out with other people during the day. One designated support person (adult) may remain overnight.

## 2022-01-11 ENCOUNTER — Encounter: Payer: Self-pay | Admitting: Urgent Care

## 2022-01-11 ENCOUNTER — Other Ambulatory Visit: Payer: Self-pay

## 2022-01-11 ENCOUNTER — Ambulatory Visit (INDEPENDENT_AMBULATORY_CARE_PROVIDER_SITE_OTHER): Payer: Medicaid Other | Admitting: Physician Assistant

## 2022-01-11 ENCOUNTER — Encounter
Admission: RE | Admit: 2022-01-11 | Discharge: 2022-01-11 | Disposition: A | Discharge status: Home / Self Care | Payer: Medicare Other | Source: Ambulatory Visit | Attending: Surgery | Admitting: Surgery

## 2022-01-11 ENCOUNTER — Encounter: Payer: Self-pay | Admitting: Physician Assistant

## 2022-01-11 ENCOUNTER — Encounter: Payer: Self-pay | Admitting: Surgery

## 2022-01-11 VITALS — BP 152/84 | HR 87 | Temp 95.3°F | Resp 16 | Ht 64.5 in | Wt 169.8 lb

## 2022-01-11 DIAGNOSIS — J0100 Acute maxillary sinusitis, unspecified: Secondary | ICD-10-CM

## 2022-01-11 DIAGNOSIS — Z8616 Personal history of COVID-19: Secondary | ICD-10-CM

## 2022-01-11 DIAGNOSIS — Z20822 Contact with and (suspected) exposure to covid-19: Secondary | ICD-10-CM | POA: Diagnosis not present

## 2022-01-11 DIAGNOSIS — Z01812 Encounter for preprocedural laboratory examination: Secondary | ICD-10-CM

## 2022-01-11 HISTORY — DX: Personal history of COVID-19: Z86.16

## 2022-01-11 MED ORDER — FLUTICASONE PROPIONATE 50 MCG/ACT NA SUSP: 1.0000 | Freq: Every day | NASAL | 1 refills | Status: DC

## 2022-01-11 MED ORDER — AZITHROMYCIN 250 MG PO TABS: ORAL_TABLET | ORAL | 0 refills | Status: AC

## 2022-01-11 NOTE — Progress Notes (Signed)
[facility] [place], [place] [address]  Internal MEDICINE  Office Visit Note  Patient Name: Amanda Duke  [id number]  [id number]  Date of Service: 01/11/2022  Chief Complaint  Patient presents with   Acute Visit    Low grade fever, runny nose,little cough    Sinusitis   Cough     HPI Pt is here for a sick visit. -Sinus drainage started Tues, worse Wednesday. Feels slightly better today, but afraid will get worse over the weekend and has surgery next week. -Has been taking tylenol -Some coughing from drainage, but no SOB or wheezing  Current Medication:  Outpatient Encounter Medications as of 01/11/2022  Medication Sig   acetaminophen (TYLENOL) 500 MG tablet Take 1,000 mg by mouth every 6 (six) hours as needed for moderate pain.   aspirin EC 81 MG tablet Take 81 mg by mouth daily. Swallow whole.   azithromycin (ZITHROMAX) 250 MG tablet Take 2 tablets on day 1, then 1 tablet daily on days 2 through 5   cholecalciferol (VITAMIN D) 1000 units tablet Take 1,000 Units by mouth daily.   clonazePAM (KLONOPIN) 0.5 MG tablet Take 1 tablet by mouth as needed (Patient taking differently: Take 0.5 mg by mouth as needed. Take 1 tablet by mouth as needed)   ibuprofen (ADVIL) 600 MG tablet Take 1 tablet (600 mg total) by mouth 2 (two) times daily as needed. (Patient taking differently: Take 600 mg by mouth 2 (two) times daily as needed for moderate pain.)   icosapent Ethyl (VASCEPA) 1 g capsule Take 2 g by mouth 2 (two) times daily.   linaclotide (LINZESS) 290 MCG CAPS capsule Take 1 capsule (290 mcg total) by mouth daily. (Patient taking differently: Take 290 mcg by mouth daily before breakfast.)   losartan (COZAAR) 100 MG tablet Take 100 mg by mouth every morning.   pantoprazole (PROTONIX) 40 MG tablet Take 40 mg by mouth in the morning.   rosuvastatin (CRESTOR) 40 MG tablet Take 40 mg by mouth at bedtime.   solifenacin (VESICARE) 5 MG tablet TAKE 1 TABLET (5  MG TOTAL) BY MOUTH DAILY. (Patient taking differently: Take 5 mg by mouth as needed.)   [DISCONTINUED] azithromycin (ZITHROMAX) 250 MG tablet Take 250 mg by mouth daily.   [DISCONTINUED] predniSONE (DELTASONE) 10 MG tablet Take 1 tab po 3 x day for 3 days then take 1 tab po 2 x a day for 3 days and then take 1 tab po daily for 3 days   carvedilol (COREG) 3.125 MG tablet Take 3.125 mg by mouth 2 (two) times daily with a meal.    diltiazem (CARDIZEM CD) 120 MG 24 hr capsule Take 120 mg by mouth every morning.   No facility-administered encounter medications on file as of 01/11/2022.      Medical History: Past Medical History:  Diagnosis Date   Anxiety    Arthritis    Coronary artery disease    Gastritis    GERD (gastroesophageal reflux disease)    History of colonic polyps    History of hiatal hernia    Hyperlipemia    Hypertension    Irregular heart beat    Pneumonia    PONV (postoperative nausea and vomiting)    n/v after hip replacement in 2022   Prediabetes    Redundant colon    Renal cyst    bilateral     Vital Signs: BP (!) 152/84   Pulse 87   Temp (!) 95.3 F (  35.2 C)   Resp 16   Ht 5' 4.5" (1.638 m)   Wt 169 lb 12.8 oz (77 kg)   SpO2 94%   BMI 28.70 kg/m    Review of Systems  Constitutional:  Positive for fever. Negative for fatigue.  HENT:  Positive for congestion, postnasal drip and sinus pressure. Negative for mouth sores.   Respiratory:  Positive for cough. Negative for shortness of breath and wheezing.   Cardiovascular:  Negative for chest pain.  Genitourinary:  Negative for flank pain.  Psychiatric/Behavioral: Negative.      Physical Exam Vitals and nursing note reviewed.  Constitutional:      General: She is not in acute distress.    Appearance: She is well-developed. She is not diaphoretic.  HENT:     Head: Normocephalic and atraumatic.     Mouth/Throat:     Pharynx: No oropharyngeal exudate.  Eyes:     Pupils: Pupils are equal, round,  and reactive to light.  Neck:     Thyroid: No thyromegaly.     Vascular: No JVD.     Trachea: No tracheal deviation.  Cardiovascular:     Rate and Rhythm: Normal rate and regular rhythm.     Heart sounds: Normal heart sounds. No murmur heard.    No friction rub. No gallop.  Pulmonary:     Effort: Pulmonary effort is normal. No respiratory distress.     Breath sounds: No wheezing or rales.  Chest:     Chest wall: No tenderness.  Abdominal:     General: Bowel sounds are normal.     Palpations: Abdomen is soft.  Musculoskeletal:        General: Normal range of motion.     Cervical back: Normal range of motion and neck supple.  Lymphadenopathy:     Cervical: No cervical adenopathy.  Skin:    General: Skin is warm and dry.  Neurological:     Mental Status: She is alert and oriented to person, place, and time.     Cranial Nerves: No cranial nerve deficit.  Psychiatric:        Behavior: Behavior normal.        Thought Content: Thought content normal.        Judgment: Judgment normal.       Assessment/Plan: 1. Acute non-recurrent maxillary sinusitis Will start on zpak and may take flonase and mucinex to help sinus congestion. May continue tylenol as needed.  - azithromycin (ZITHROMAX) 250 MG tablet; Take 2 tablets on day 1, then 1 tablet daily on days 2 through 5  Dispense: 6 tablet; Refill: 0   General Counseling: [name] verbalizes understanding of the findings of todays visit and agrees with plan of treatment. I have discussed any further diagnostic evaluation that may be needed or ordered today. We also reviewed her medications today. she has been encouraged to call the office with any questions or concerns that should arise related to todays visit.    Counseling:    No orders of the defined types were placed in this encounter.   Meds ordered this encounter  Medications   azithromycin (ZITHROMAX) 250 MG tablet    Sig: Take 2 tablets on day 1, then 1 tablet daily on  days 2 through 5    Dispense:  6 tablet    Refill:  0    Time spent:25 Minutes

## 2022-01-12 ENCOUNTER — Encounter: Payer: Self-pay | Admitting: Urgent Care

## 2022-01-12 ENCOUNTER — Telehealth: Payer: Self-pay | Admitting: Urgent Care

## 2022-01-12 DIAGNOSIS — Z01812 Encounter for preprocedural laboratory examination: Secondary | ICD-10-CM

## 2022-01-12 DIAGNOSIS — U071 COVID-19: Secondary | ICD-10-CM

## 2022-01-12 LAB — SARS CORONAVIRUS 2 (TAT 6-24 HRS): SARS Coronavirus 2: POSITIVE — AB

## 2022-01-12 NOTE — Progress Notes (Addendum)
[facility] Perioperative Services: Pre-Admission/Anesthesia Testing  Abnormal Lab Notification   Date: 01/12/22  Name: Amanda Duke MRN:   [id number]  Re: Abnormal labs noted during PAT appointment   Notified:  PROVIDER NAME PROVIDER [place], MD General Surgery (Surgeon) Routed and/or faxed via Lanna Poche, PA-C Primary Care Provider Routed and/or faxed via [place] and Notes:  ABNORMAL LAB VALUE(S): Lab Results  Component Value Date   [place] (A) 01/11/2022    Amanda Duke is scheduled for a ROBOTIC ASSISTED PARAESOPHAGEAL HERNIA REPAIR on 01/15/2022 with Caroleen Hamman, MD. Patient reported to have been "sick" with fever and upper respiratory symptoms since 01/08/2022. Patient presented to her PCP yesterday (01/11/2022) for further evaluation. Patient reports that she advised PCP of upcoming surgery. Despite symptom constellation, patient was not tested for SARS-CoV-2 (novel coronavirus) during her visit with her PCP. In review of the office notes, patient was diagnosed with sinusitis and prescribed a 5 day course of azithromycin.   Patient was submitted for pre-anesthesia review given her cardiovascular history. In brief review of her chart, I noted that visit with her PCP. Patient was immediately contacted and asked to come in for SARS-CoV-2 testing yesterday. Surgeon was updated on symptoms, as they were pointing to the patient having SARS-CoV-2.  Per Dr. Dahlia Byes, plan is to await testing results. If negative, will follow up with patient on Monday to see how she was doing. No changes were made to the OR schedule.   Testing results became available today (01/12/2022); results (+) for SARS-CoV-2. I have made several attempts to contact the patient and her emergency contacts [name], [name], [name]) today, however I have been unable to speak with anyone. I wanted to discuss results, return  precautions, and the need to postpone surgery with her. Voicemail boxes either not set up or full; unable to leave message. I will continue to attempt to make contact.   Impression and Plan:  Amanda Duke is scheduled for surgery on 01/15/2022. Preoperative SARS-CoV-2 testing found to be (+). Surgery will unfortunately need to be postponed for a minimum of 10 days. If at the 10 day mark, patient is still having any residual symptoms, case will need to be push out further (total of 21 days).   Once I speak with patient, she will be advised to increase fluid intake and rest. Will encourage her to also make efforts to move around and to TCDB to reduce chances of her fatigue leading to deconditioning and her developing a secondary viral pneumonia. Education will be provided regarding when she will would need to seek further care, including uncontrolled fevers, increased SOB, N/V/D, inability to eat/drink, etc. Given her age, if she develops any of these symptoms, she will need to seek care in the emergency department. Otherwise, patient will follow up with her PCP for continuing care.   Copy of this note sent to primary attending surgeon Dahlia Byes, MD) and PCP ([place], PA-C) to make them aware of the (+) results and the need for changes to her surgical plan of care. Dr. Dahlia Byes and I spoke yesterday, so he is aware that there was a strong possibility of the patient being positive. Sending communication his way that the patient's diagnosis of SARS-CoV-2 as been confirmed.   ADDENDUM --> 01/12/2022 at 1805: Finally able to touch base with patient's daughter [name]). Discussed results and need for quarantine period (5 days from symptom onset, if not feeling  better, quarantine extended to 10 days). Discussed handwashing and masking. Asked that someone check on patient and that they have a low threshold for taking her to the ED if not improving. Emphasis made on need for increased hydration, rest, and the  aforementioned TCDB exercises. Discussed that given her age, and multiple medical comorbidities, patient has the risk of this infection being worse as compared to someone who is younger and in optimal health. Daughter verbalized understanding. Will have Dr. Corlis Leak office reach out to patient regarding rescheduling elective surgery once the patient is well. All questions fielded to satisfaction.  Encounter Diagnoses  Name Primary?   Pre-operative laboratory examination Yes   Encounter for preoperative screening laboratory testing for COVID-19 virus    Lab test positive for detection of COVID-19 virus    Honor Loh, MSN, APRN, FNP-C, CEN [facility]  Peri-operative Services Nurse Practitioner Phone: [phone] 01/12/22 5:05 PM  NOTE: This note has been prepared using Dragon dictation software. Despite my best ability to proofread, there is always the potential that unintentional transcriptional errors may still occur from this process.

## 2022-01-14 ENCOUNTER — Telehealth: Payer: Self-pay | Admitting: Surgery

## 2022-01-14 ENCOUNTER — Ambulatory Visit: Payer: Medicaid Other | Admitting: Physician Assistant

## 2022-01-14 NOTE — Telephone Encounter (Signed)
Updated information regarding rescheduled surgery.   Patient has been advised of Pre-Admission date/time, and Surgery date at [facility].  Surgery Date: 02/05/22 Preadmission Testing Date: 01/28/22 (phone 8a-1p)  Patient has been made aware to call [phone], between 1-3:00pm the day before surgery, to find out what time to arrive for surgery.

## 2022-01-15 DIAGNOSIS — Z20822 Contact with and (suspected) exposure to covid-19: Secondary | ICD-10-CM

## 2022-01-15 HISTORY — DX: Other specified cardiac arrhythmias: I49.8

## 2022-01-28 ENCOUNTER — Encounter
Admission: RE | Admit: 2022-01-28 | Discharge: 2022-01-28 | Disposition: A | Discharge status: Home / Self Care | Payer: Medicare Other | Source: Ambulatory Visit | Attending: Surgery | Admitting: Surgery

## 2022-01-28 NOTE — Patient Instructions (Addendum)
Your procedure is scheduled on:02-05-22 Tuesday Report to the Registration Desk on the 1st floor of the [facility].Then proceed to the 2nd floor Surgery Desk To find out your arrival time, please call [phone] between 1PM - 3PM on:02-04-22 Monday If your arrival time is 6:00 am, do not arrive prior to that time as the [facility] entrance doors do not open until 6:00 am.  REMEMBER: Instructions that are not followed completely may result in serious medical risk, up to and including death; or upon the discretion of your surgeon and anesthesiologist your surgery may need to be rescheduled.  Do not eat food after midnight the night before surgery.  No gum chewing, lozengers or hard candies.  You may however, drink CLEAR liquids up to 2 hours before you are scheduled to arrive for your surgery. Do not drink anything within 2 hours of your scheduled arrival time.  Clear liquids include: - water  - apple juice without pulp - gatorade (not RED colors) - black coffee or tea (Do NOT add milk or creamers to the coffee or tea) Do NOT drink anything that is not on this list.  TAKE THESE MEDICATIONS THE MORNING OF SURGERY WITH A SIP OF WATER: -carvedilol (COREG) -diltiazem (CARDIZEM CD)  -pantoprazole (PROTONIX)-take one the night before and one on the morning of surgery - helps to prevent nausea after surgery.) -You may take clonazePAM (KLONOPIN) if needed the day of surgery  Stop your 81 mg Aspirin 5 days prior to surgery as instructed by Dr. Barb Merino office-Last dose will be on 01-30-22 Wednesday  One week prior to surgery: Stop Anti-inflammatories (NSAIDS) such as Advil, Aleve, Ibuprofen, Motrin, Naproxen, Naprosyn and Aspirin based products such as Excedrin, Goodys Powder, BC Powder.You may however, continue to take Tylenol if needed for pain up until the day of surgery.  Stop ANY OVER THE COUNTER supplements until after surgery (Vitamin D)   No Alcohol for 24 hours before or after  surgery.  No Smoking including e-cigarettes for 24 hours prior to surgery.  No chewable tobacco products for at least 6 hours prior to surgery.  No nicotine patches on the day of surgery.  Do not use any "recreational" drugs for at least a week prior to your surgery.  Please be advised that the combination of cocaine and anesthesia may have negative outcomes, up to and including death. If you test positive for cocaine, your surgery will be cancelled.  On the morning of surgery brush your teeth with toothpaste and water, you may rinse your mouth with mouthwash if you wish. Do not swallow any toothpaste or mouthwash.  Use CHG Soap or wipes as directed on instruction sheet.  Do not wear jewelry, make-up, hairpins, clips or nail polish.  Do not wear lotions, powders, or perfumes.   Do not shave body from the neck down 48 hours prior to surgery just in case you cut yourself which could leave a site for infection.  Also, freshly shaved skin may become irritated if using the CHG soap.  Contact lenses, hearing aids and dentures may not be worn into surgery.  Do not bring valuables to the hospital. [facility] is not responsible for any missing/lost belongings or valuables.  Notify your doctor if there is any change in your medical condition (cold, fever, infection).  Wear comfortable clothing (specific to your surgery type) to the hospital.  After surgery, you can help prevent lung complications by doing breathing exercises.  Take deep breaths and cough every 1-2  hours. Your doctor may order a device called an Incentive Spirometer to help you take deep breaths. When coughing or sneezing, hold a pillow firmly against your incision with both hands. This is called "splinting." Doing this helps protect your incision. It also decreases belly discomfort.  If you are being admitted to the hospital overnight, leave your suitcase in the car. After surgery it may be brought to your room.  If you  are being discharged the day of surgery, you will not be allowed to drive home. You will need a responsible adult (18 years or older) to drive you home and stay with you that night.   If you are taking public transportation, you will need to have a responsible adult (18 years or older) with you. Please confirm with your physician that it is acceptable to use public transportation.   Please call the [facility] Dept. at [phone] if you have any questions about these instructions.  Surgery Visitation Policy:  Patients undergoing a surgery or procedure may have two family members or support persons with them as long as the person is not COVID-19 positive or experiencing its symptoms.   Inpatient Visitation:    Visiting hours are 7 a.m. to 8 p.m. Up to four visitors are allowed at one time in a patient room, including children. The visitors may rotate out with other people during the day. One designated support person (adult) may remain overnight.

## 2022-01-28 NOTE — Pre-Procedure Instructions (Signed)
Spoke with pt as surgery has now been rescheduled to 02-05-22 with Dr Dahlia Byes. PPt tested +for Covid on 9-15. Pt states she is much better. Denies fever. States she still has a little congestion which she is taking Mucinex for but overall all other symptoms are gone. Reviewed surgery instructions again with pt and pt verbalized understanding

## 2022-01-30 ENCOUNTER — Encounter: Payer: Self-pay | Admitting: Surgery

## 2022-01-30 NOTE — Progress Notes (Signed)
Perioperative Services  Pre-Admission/Anesthesia Testing Clinical Review  Date: 02/01/22  Patient Demographics:  Name: Amanda Duke DOB:   08-Apr-1941 MRN:   [id number]  Planned Surgical Procedure(s):    Case: [id number] Date/Time: 02/05/22 0715   Procedures:  XI ROBOTIC ASSISTED PARAESOPHAGEAL HERNIA REPAIR; HERNIA REPAIR UMBILICAL ADULT   Anesthesia type: General   Pre-op diagnosis: Hiatal hernia   Location: [facility] OR ROOM 06 / [place] [facility]S FOR ANESTHESIA GROUP   Surgeons: Jules Husbands, MD   NOTE: Available PAT nursing documentation and vital signs have been reviewed. Clinical nursing staff has updated patient's PMH/PSHx, current medication list, and drug allergies/intolerances to ensure comprehensive history available to assist in medical decision making as it pertains to the aforementioned surgical procedure and anticipated anesthetic course. Extensive review of available clinical information performed. [place] PMH and PSHx updated with any diagnoses/procedures that  may have been inadvertently omitted during her intake with the pre-admission testing department's nursing staff.  Clinical Discussion:  Amanda Duke is a 81 y.o. female who is submitted for pre-surgical anesthesia review and clearance prior to her undergoing the above procedure. Patient is a Former Smoker (2.5 pack years; quit 04/1978). Pertinent PMH includes: CAD, diastolic dysfunction, HTN, HLD, prediabetes, GERD (on daily PPI), hiatal hernia, gastritis, umbilical hernia, OA, anxiety.  Patient is followed by cardiology Nehemiah Massed, MD). She was last seen in the cardiology clinic on 12/06/2021; notes reviewed. At the time of her clinic visit, the patient denied any chest pain, shortness of breath, PND, orthopnea, palpitations, significant peripheral edema, vertiginous symptoms, or presyncope/syncope.  Patient with a PMH significant for cardiovascular diagnoses.  Patient underwent diagnostic LEFT heart catheterization  on 09/25/2001 revealing a normal left ventricular systolic function with a hyperdynamic LVEF of 78%.  There was single-vessel nonobstructive coronary artery disease noted; 20% proximal LAD.  Intervention was deferred opting for medical management.  Repeat diagnostic LEFT heart catheterization was performed on 06/09/2003 revealing normal left ventricular systolic function with an EF of 63%.  There was multivessel CAD; 20% proximal LCx, 20% proximal LAD, 50% mid LAD, 50% distal LAD.  Given the nonobstructive nature of her disease, the decision was made to defer intervention opting for medical management.  Most recent TTE was performed on 03/30/2020 revealing a normal left ventricular systolic function with mild LVH; LVEF >55%.  Right ventricular size and function normal.  There was mild pan valvular regurgitation noted.  There was no evidence of a significant transvalvular gradient to suggest stenosis.  Most recent myocardial perfusion imaging study was performed on 03/30/2020 revealed a normal left ventricular systolic function with a hyperdynamic LVEF of 69%.  There were no regional wall motion abnormalities.  There was no evidence of stress-induced myocardial ischemia or arrhythmia; no scintigraphic evidence of scar.  Study determined to be normal and low risk.  Blood pressure mildly elevated at 140/82 mmHg on currently prescribed ARB (losartan) monotherapy.  Patient is on rosuvastatin + icosapent ethyl for her HLD diagnosis and further ASCVD prevention.  She is not diabetic. Patient does not have an OSAH diagnosis.  Functional capacity limited by age and multiple medical comorbidities.  Following assessment, patient is not felt to be able to achieve 4 METS of physical activity without experiencing any degree of angina/anginal equivalent symptoms.  No changes were made to her medication regimen.  Patient to follow-up with outpatient cardiology and 1 year or sooner if needed.  Amanda Duke is scheduled  for an elective XI ROBOTIC ASSISTED PARAESOPHAGEAL HERNIA REPAIR; UMBILICAL  HERNIA REPAIR on 02/05/2022 with Dr. Caroleen Hamman, MD. Given patient's past medical history significant for cardiovascular diagnoses, presurgical cardiac clearance was sought by the PAT team. Per cardiology, "this patient is optimized for surgery and may proceed with the planned procedural course with a LOW risk of significant perioperative cardiovascular complications". In review of hermedication reconciliation, it is noted that patient is currently on prescribed daily antiplatelet therapy. She has been instructed on recommendations for holding her daily low-dose ASA for 5 days prior to her procedure with plans to restart as soon as postoperative bleeding risk felt to be minimized by her attending surgeon. The patient has been instructed that her last dose of her anticoagulant will be on 01/30/2022.  Patient reports previous perioperative complications with anesthesia in the past. Patient has a PMH (+) for PONV. Symptoms and history of PONV will be discussed with patient by anesthesia team on the day of her procedure. Interventions will be ordered as deemed necessary based on patient's individual care needs as determined by anesthesiologist. In review of the available records, it is noted that patient underwent a general anesthetic course here at [facility] ([facility]) in 11/2021 without documented complications.      01/11/2022    8:24 AM 01/07/2022    1:41 PM 12/27/2021    2:49 PM  Vitals with BMI  Height 5' 4.5"    Weight 169 lbs 13 oz 172 lbs 13 oz   BMI 98.33 82.50   Systolic 539 767 341  Diastolic 84 76 80  Pulse 87 73 66    Providers/Specialists:   NOTE: Primary physician provider listed below. Patient may have been seen by APP or partner within same practice.   PROVIDER ROLE / SPECIALTY LAST Suszanne Finch, MD General Surgery (Surgeon) 01/07/2022  Mylinda Latina, PA-C  Primary Care Provider 01/11/2022  Serafina Royals, MD Cardiology 12/06/2021   Allergies:  Amoxicillin, Clindamycin, Gemfibrozil, Levofloxacin, Metoprolol, Simvastatin, Macrobid [nitrofurantoin macrocrystal], Niacin, Nystatin, Nystatin-triamcinolone, and Sulfa antibiotics  Current Home Medications:   No current facility-administered medications for this encounter.    acetaminophen (TYLENOL) 500 MG tablet   aspirin EC 81 MG tablet   carvedilol (COREG) 3.125 MG tablet   cholecalciferol (VITAMIN D) 1000 units tablet   clonazePAM (KLONOPIN) 0.5 MG tablet   diltiazem (CARDIZEM CD) 120 MG 24 hr capsule   Fexofenadine HCl (MUCINEX ALLERGY PO)   fluticasone (FLONASE) 50 MCG/ACT nasal spray   ibuprofen (ADVIL) 600 MG tablet   icosapent Ethyl (VASCEPA) 1 g capsule   linaclotide (LINZESS) 290 MCG CAPS capsule   losartan (COZAAR) 100 MG tablet   pantoprazole (PROTONIX) 40 MG tablet   rosuvastatin (CRESTOR) 40 MG tablet   solifenacin (VESICARE) 5 MG tablet   History:   Past Medical History:  Diagnosis Date   Anxiety    Aortic atherosclerosis (HCC)    Arthritis    Bilateral renal cysts    Coronary artery disease 09/25/2001   a.) LHC 09/25/2001: EF 78%. 20% pLAD - med mgmt; b.) LHC 06/09/2003: EF 63%. 20% pLCx, 20% pLAD, 50% mLAD, 50% dLAD - med mgmt.   Diastolic dysfunction 93/79/0240   a.) Stress echo 12/30/2017: ED >55%, triv PR, mild AR.MR, mod TR, G1DD; b.) TTE 03/30/2020: EF >55%, mild LVH, mild panvalvular regurgitation.   Gastritis    GERD (gastroesophageal reflux disease)    Hepatic steatosis    History of 2019 novel coronavirus disease (COVID-19) 01/11/2022   History of hiatal hernia  Hyperlipemia    Hyperplastic colon polyp    Hypertension    Pneumonia    PONV (postoperative nausea and vomiting)    a.) PONV following orthopedic surgery (hip) in 2022   Prediabetes    Redundant colon    Tubular adenoma of colon    Umbilical hernia    Past Surgical History:  Procedure  Laterality Date   [place]   COLONOSCOPY WITH PROPOFOL N/A 12/12/2016   Procedure: COLONOSCOPY WITH PROPOFOL;  Surgeon: Lollie Sails, MD;  Location: [facility] ENDOSCOPY;  Service: Endoscopy;  Laterality: N/A;   COLONOSCOPY WITH PROPOFOL N/A 07/14/2019   Procedure: COLONOSCOPY WITH PROPOFOL;  Surgeon: Toledo, Benay Pike, MD;  Location: [facility] ENDOSCOPY;  Service: Gastroenterology;  Laterality: N/A;   ESOPHAGOGASTRODUODENOSCOPY N/A 12/27/2021   Procedure: ESOPHAGOGASTRODUODENOSCOPY (EGD);  Surgeon: Benjamine Sprague, DO;  Location: [facility] ENDOSCOPY;  Service: General;  Laterality: N/A;   ESOPHAGOGASTRODUODENOSCOPY (EGD) WITH PROPOFOL N/A 12/12/2016   Procedure: ESOPHAGOGASTRODUODENOSCOPY (EGD) WITH PROPOFOL;  Surgeon: Lollie Sails, MD;  Location: [facility] ENDOSCOPY;  Service: Endoscopy;  Laterality: N/A;   ESOPHAGOGASTRODUODENOSCOPY (EGD) WITH PROPOFOL N/A 07/14/2019   Procedure: ESOPHAGOGASTRODUODENOSCOPY (EGD) WITH PROPOFOL;  Surgeon: Toledo, Benay Pike, MD;  Location: [facility] ENDOSCOPY;  Service: Gastroenterology;  Laterality: N/A;   LAPAROSCOPIC UNILATERAL SALPINGO OOPHERECTOMY  1985   right   LEFT HEART CATH AND CORONARY ANGIOGRAPHY Left 09/25/2001   Procedure: LEFT HEART CATH AND CORONARY ANGIOGRAPHY; Location: [place]; Surgeon: Katrine Coho, MD   LEFT HEART CATH AND CORONARY ANGIOGRAPHY Left 06/09/2003   Procedure: LEFT HEART CATH AND CORONARY ANGIOGRAPHY; Location: [place]; Surgeon: Katrine Coho, MD   ORIF ANKLE FRACTURE Right    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    TONSILLECTOMY  1948   TUBAL LIGATION  1971   VAGINAL HYSTERECTOMY  1985   d/t heavy bleeding   Family History  Problem Relation Age of Onset   Heart failure Mother    Stroke Mother    Breast cancer Mother        dx at age 24 yo   Diabetes Mother    Osteoporosis Mother    Emphysema Father    Ovarian cancer Sister        dx at age 74 yo   Asthma Sister     Diabetes Daughter    Heart disease Daughter    COPD Daughter    Social History   Tobacco Use   Smoking status: Former    Packs/day: 0.50    Years: 5.00    Total pack years: 2.50    Types: Cigarettes    Quit date: 1980    Years since quitting: 43.7   Smokeless tobacco: Never  Vaping Use   Vaping Use: Never used  Substance Use Topics   Alcohol use: No    Alcohol/week: 0.0 standard drinks of alcohol   Drug use: Never    Pertinent Clinical Results:  LABS: Labs reviewed: Acceptable for surgery.  Lab Results  Component Value Date   WBC 7.7 11/11/2021   HGB 12.5 11/11/2021   HCT 40.4 11/11/2021   MCV 82.8 11/11/2021   PLT 333 11/11/2021   Lab Results  Component Value Date   NA 139 11/11/2021   K 3.8 11/11/2021   CO2 24 11/11/2021   GLUCOSE 98 11/11/2021   BUN 16 11/11/2021   CREATININE 1.11 (H) 11/11/2021   CALCIUM 9.3 11/11/2021   GFRNONAA 50 (L) 11/11/2021  ECG: Date: 11/11/2021 Time ECG obtained: 1839 PM Rate: 68 bpm Rhythm: normal sinus Axis (leads I and aVF): Normal Intervals: PR 198 ms. QRS 78 ms. QTc 429 ms. ST segment and T wave changes: No evidence of acute ST segment elevation or depression Comparison: Similar to previous tracing obtained on 11/23/2020   IMAGING / PROCEDURES: DG ESOPHAGUS W SINGLE CM (SOL OR THIN BA) performed on 12/17/2021 Large hiatal hernia  CT ABDOMEN PELVIS W CONTRAST performed on 08/65/7846 Umbilical herniation of mesenteric fat measuring 2.2 x 2.3 cm. Small inguinal herniation of mesenteric fat is identified bilaterally. Large hiatal hernia. Fatty infiltration of liver. Aortic atherosclerosis   DG CHEST 2 VIEW performed on 11/11/2021 Lungs are clear.  No pneumothorax or pleural effusion.  Cardiac size is within normal limits.  Large hiatal hernia.  Pulmonary vascularity is normal.  No acute bone abnormality. No radiographic evidence of acute cardiopulmonary disease  MYOCARDIAL PERFUSION IMAGING STUDY  (LEXISCAN) performed on 03/30/2020 Normal left ventricular systolic function with a hyperdynamic LVEF of 69% Normal myocardial thickening and wall motion Left ventricular cavity size normal SPECT images demonstrate homogenous tracer distribution throughout the myocardium No evidence of stress-induced myocardial ischemia or arrhythmia Normal low risk study  TRANSTHORACIC ECHOCARDIOGRAM performed on 03/30/2020 Normal left ventricular systolic function with an EF of >55% Mild LVH Normal left ventricular diastolic Doppler parameters Normal right ventricular systolic function Mild panvalvular regurgitation Normal transvalvular gradients; no valvular stenosis No pericardial effusion  Impression and Plan:  Amanda Duke has been referred for pre-anesthesia review and clearance prior to her undergoing the planned anesthetic and procedural courses. Available labs, pertinent testing, and imaging results were personally reviewed by me. This patient has been appropriately cleared by cardiology with an overall LOW risk of significant perioperative cardiovascular complications.  Based on clinical review performed today (02/01/22), barring any significant acute changes in the patient's overall condition, it is anticipated that she will be able to proceed with the planned surgical intervention. Any acute changes in clinical condition may necessitate her procedure being postponed and/or cancelled. Patient will meet with anesthesia team (MD and/or CRNA) on the day of her procedure for preoperative evaluation/assessment. Questions regarding anesthetic course will be fielded at that time.   Pre-surgical instructions were reviewed with the patient during her PAT appointment and questions were fielded by PAT clinical staff. Patient was advised that if any questions or concerns arise prior to her procedure then she should return a call to PAT and/or her surgeon's office to discuss.  Honor Loh, MSN, APRN, FNP-C,  [facility]  Peri-operative Services Nurse Practitioner Phone: [phone] Fax: [phone] 02/01/22 11:25 AM  NOTE: This note has been prepared using Dragon dictation software. Despite my best ability to proofread, there is always the potential that unintentional transcriptional errors may still occur from this process.

## 2022-02-05 ENCOUNTER — Observation Stay
Admission: RE | Admit: 2022-02-05 | Discharge: 2022-02-06 | Disposition: A | Discharge status: Home / Self Care | Payer: Medicare Other | Source: Ambulatory Visit | Attending: Surgery | Admitting: Surgery

## 2022-02-05 ENCOUNTER — Encounter: Payer: Self-pay | Admitting: Surgery

## 2022-02-05 ENCOUNTER — Other Ambulatory Visit: Payer: Self-pay

## 2022-02-05 ENCOUNTER — Ambulatory Visit: Payer: Medicare Other | Admitting: Urgent Care

## 2022-02-05 ENCOUNTER — Encounter
Admission: RE | Disposition: A | Discharge status: Home / Self Care | Payer: Self-pay | Source: Ambulatory Visit | Attending: Surgery

## 2022-02-05 DIAGNOSIS — E785 Hyperlipidemia, unspecified: Secondary | ICD-10-CM | POA: Diagnosis not present

## 2022-02-05 DIAGNOSIS — K449 Diaphragmatic hernia without obstruction or gangrene: Secondary | ICD-10-CM | POA: Diagnosis not present

## 2022-02-05 DIAGNOSIS — Z8719 Personal history of other diseases of the digestive system: Secondary | ICD-10-CM

## 2022-02-05 DIAGNOSIS — Z01812 Encounter for preprocedural laboratory examination: Secondary | ICD-10-CM

## 2022-02-05 DIAGNOSIS — I2510 Atherosclerotic heart disease of native coronary artery without angina pectoris: Secondary | ICD-10-CM | POA: Insufficient documentation

## 2022-02-05 DIAGNOSIS — Z1152 Encounter for screening for COVID-19: Secondary | ICD-10-CM

## 2022-02-05 DIAGNOSIS — R112 Nausea with vomiting, unspecified: Secondary | ICD-10-CM | POA: Diagnosis not present

## 2022-02-05 DIAGNOSIS — K219 Gastro-esophageal reflux disease without esophagitis: Secondary | ICD-10-CM

## 2022-02-05 DIAGNOSIS — I10 Essential (primary) hypertension: Secondary | ICD-10-CM | POA: Insufficient documentation

## 2022-02-05 DIAGNOSIS — Z87891 Personal history of nicotine dependence: Secondary | ICD-10-CM | POA: Insufficient documentation

## 2022-02-05 DIAGNOSIS — K432 Incisional hernia without obstruction or gangrene: Secondary | ICD-10-CM

## 2022-02-05 DIAGNOSIS — Z23 Encounter for immunization: Secondary | ICD-10-CM | POA: Diagnosis not present

## 2022-02-05 DIAGNOSIS — K439 Ventral hernia without obstruction or gangrene: Secondary | ICD-10-CM

## 2022-02-05 HISTORY — DX: Cyst of kidney, acquired: N28.1

## 2022-02-05 HISTORY — DX: Polyp of colon: K63.5

## 2022-02-05 HISTORY — DX: Umbilical hernia without obstruction or gangrene: K42.9

## 2022-02-05 HISTORY — DX: Fatty (change of) liver, not elsewhere classified: K76.0

## 2022-02-05 HISTORY — PX: XI ROBOTIC ASSISTED PARAESOPHAGEAL HERNIA REPAIR: SHX6871

## 2022-02-05 HISTORY — DX: Atherosclerosis of aorta: I70.0

## 2022-02-05 HISTORY — PX: INSERTION OF MESH: SHX5868

## 2022-02-05 HISTORY — PX: UMBILICAL HERNIA REPAIR: SHX196

## 2022-02-05 HISTORY — DX: Benign neoplasm of colon, unspecified: D12.6

## 2022-02-05 LAB — CBC
HCT: 35.5 % — ABNORMAL LOW (ref 36.0–46.0)
Hemoglobin: 11.2 g/dL — ABNORMAL LOW (ref 12.0–15.0)
MCH: 25.6 pg — ABNORMAL LOW (ref 26.0–34.0)
MCHC: 31.5 g/dL (ref 30.0–36.0)
MCV: 81.2 fL (ref 80.0–100.0)
Platelets: 237 10*3/uL (ref 150–400)
RBC: 4.37 MIL/uL (ref 3.87–5.11)
RDW: 16.7 % — ABNORMAL HIGH (ref 11.5–15.5)
WBC: 10.1 10*3/uL (ref 4.0–10.5)
nRBC: 0 % (ref 0.0–0.2)

## 2022-02-05 LAB — CREATININE, SERUM
Creatinine, Ser: 1.06 mg/dL — ABNORMAL HIGH (ref 0.44–1.00)
GFR, Estimated: 53 mL/min — ABNORMAL LOW (ref >60)

## 2022-02-05 SURGERY — REPAIR, HERNIA, PARAESOPHAGEAL, ROBOT-ASSISTED
Anesthesia: General

## 2022-02-05 MED ORDER — MEPERIDINE HCL 25 MG/ML IJ SOLN: 6.2500 mg | INTRAMUSCULAR | Status: DC | PRN

## 2022-02-05 MED ORDER — SUGAMMADEX SODIUM 200 MG/2ML IV SOLN
INTRAVENOUS | Status: DC | PRN
  Administered 2022-02-05: 200 mg via INTRAVENOUS

## 2022-02-05 MED ORDER — VISTASEAL 10 ML SINGLE DOSE KIT
PACK | CUTANEOUS | Status: AC
  Filled 2022-02-05: qty 10

## 2022-02-05 MED ORDER — ONDANSETRON HCL 4 MG/2ML IJ SOLN
INTRAMUSCULAR | Status: DC | PRN
  Administered 2022-02-05 (×2): 4 mg via INTRAVENOUS

## 2022-02-05 MED ORDER — PHENYLEPHRINE HCL (PRESSORS) 10 MG/ML IV SOLN
INTRAVENOUS | Status: AC
  Filled 2022-02-05: qty 1

## 2022-02-05 MED ORDER — ENOXAPARIN SODIUM 40 MG/0.4ML IJ SOSY
40.0000 mg | PREFILLED_SYRINGE | INTRAMUSCULAR | Status: DC
  Administered 2022-02-06: 40 mg via SUBCUTANEOUS
  Filled 2022-02-05: qty 0.4

## 2022-02-05 MED ORDER — PROPOFOL 1000 MG/100ML IV EMUL
INTRAVENOUS | Status: AC
  Filled 2022-02-05: qty 100

## 2022-02-05 MED ORDER — ACETAMINOPHEN 500 MG PO TABS
1000.0000 mg | ORAL_TABLET | Freq: Four times a day (QID) | ORAL | Status: DC
  Administered 2022-02-05 – 2022-02-06 (×4): 1000 mg via ORAL
  Filled 2022-02-05 (×4): qty 2

## 2022-02-05 MED ORDER — CEFAZOLIN SODIUM-DEXTROSE 2-4 GM/100ML-% IV SOLN
2.0000 g | INTRAVENOUS | Status: AC
  Administered 2022-02-05: 2 g via INTRAVENOUS

## 2022-02-05 MED ORDER — GABAPENTIN 300 MG PO CAPS
ORAL_CAPSULE | ORAL | Status: AC
  Administered 2022-02-05: 300 mg
  Filled 2022-02-05: qty 1

## 2022-02-05 MED ORDER — CEFAZOLIN SODIUM-DEXTROSE 2-4 GM/100ML-% IV SOLN
INTRAVENOUS | Status: AC
  Filled 2022-02-05: qty 100

## 2022-02-05 MED ORDER — OXYCODONE HCL 5 MG PO TABS
5.0000 mg | ORAL_TABLET | ORAL | Status: DC | PRN
  Administered 2022-02-06 (×2): 10 mg via ORAL
  Filled 2022-02-05 (×2): qty 2

## 2022-02-05 MED ORDER — APREPITANT 40 MG PO CAPS
ORAL_CAPSULE | ORAL | Status: AC
  Filled 2022-02-05: qty 1

## 2022-02-05 MED ORDER — PROCHLORPERAZINE EDISYLATE 10 MG/2ML IJ SOLN: 5.0000 mg | Freq: Four times a day (QID) | INTRAMUSCULAR | Status: DC | PRN

## 2022-02-05 MED ORDER — GLYCOPYRROLATE 0.2 MG/ML IJ SOLN
INTRAMUSCULAR | Status: DC | PRN
  Administered 2022-02-05: .2 mg via INTRAVENOUS

## 2022-02-05 MED ORDER — CHLORHEXIDINE GLUCONATE CLOTH 2 % EX PADS: 6.0000 | MEDICATED_PAD | Freq: Once | CUTANEOUS | Status: DC

## 2022-02-05 MED ORDER — SODIUM CHLORIDE 0.9 % IV SOLN: INTRAVENOUS | Status: DC

## 2022-02-05 MED ORDER — CELECOXIB 200 MG PO CAPS: 200.0000 mg | ORAL_CAPSULE | ORAL | Status: DC

## 2022-02-05 MED ORDER — INFLUENZA VAC A&B SA ADJ QUAD 0.5 ML IM PRSY
0.5000 mL | PREFILLED_SYRINGE | INTRAMUSCULAR | Status: AC
  Administered 2022-02-06: 0.5 mL via INTRAMUSCULAR
  Filled 2022-02-05: qty 0.5

## 2022-02-05 MED ORDER — GABAPENTIN 300 MG PO CAPS: 300.0000 mg | ORAL_CAPSULE | ORAL | Status: AC

## 2022-02-05 MED ORDER — CLONAZEPAM 0.5 MG PO TABS
0.5000 mg | ORAL_TABLET | Freq: Every day | ORAL | Status: DC | PRN
  Administered 2022-02-06: 0.5 mg via ORAL
  Filled 2022-02-05: qty 1

## 2022-02-05 MED ORDER — ACETAMINOPHEN 160 MG/5ML PO SOLN
325.0000 mg | ORAL | Status: DC | PRN
  Filled 2022-02-05: qty 20.3

## 2022-02-05 MED ORDER — ACETAMINOPHEN 500 MG PO TABS: 1000.0000 mg | ORAL_TABLET | ORAL | Status: DC

## 2022-02-05 MED ORDER — LIDOCAINE HCL (CARDIAC) PF 100 MG/5ML IV SOSY
PREFILLED_SYRINGE | INTRAVENOUS | Status: DC | PRN
  Administered 2022-02-05: 100 mg via INTRAVENOUS

## 2022-02-05 MED ORDER — SUCCINYLCHOLINE CHLORIDE 200 MG/10ML IV SOSY
PREFILLED_SYRINGE | INTRAVENOUS | Status: DC | PRN
  Administered 2022-02-05: 100 mg via INTRAVENOUS

## 2022-02-05 MED ORDER — MORPHINE SULFATE (PF) 2 MG/ML IV SOLN: 2.0000 mg | INTRAVENOUS | Status: DC | PRN

## 2022-02-05 MED ORDER — FENTANYL CITRATE (PF) 100 MCG/2ML IJ SOLN
INTRAMUSCULAR | Status: AC
  Filled 2022-02-05: qty 2

## 2022-02-05 MED ORDER — FENTANYL CITRATE (PF) 100 MCG/2ML IJ SOLN
INTRAMUSCULAR | Status: DC | PRN
  Administered 2022-02-05: 50 ug via INTRAVENOUS

## 2022-02-05 MED ORDER — ONDANSETRON HCL 4 MG/2ML IJ SOLN: 4.0000 mg | Freq: Once | INTRAMUSCULAR | Status: DC | PRN

## 2022-02-05 MED ORDER — KETOROLAC TROMETHAMINE 15 MG/ML IJ SOLN
15.0000 mg | Freq: Four times a day (QID) | INTRAMUSCULAR | Status: DC
  Administered 2022-02-05 – 2022-02-06 (×4): 15 mg via INTRAVENOUS
  Filled 2022-02-05 (×4): qty 1

## 2022-02-05 MED ORDER — ICOSAPENT ETHYL 1 G PO CAPS
2.0000 g | ORAL_CAPSULE | Freq: Two times a day (BID) | ORAL | Status: DC
  Filled 2022-02-05 (×2): qty 2

## 2022-02-05 MED ORDER — PHENYLEPHRINE HCL-NACL 20-0.9 MG/250ML-% IV SOLN
INTRAVENOUS | Status: DC | PRN
  Administered 2022-02-05: 25 ug/min via INTRAVENOUS

## 2022-02-05 MED ORDER — ORAL CARE MOUTH RINSE: 15.0000 mL | Freq: Once | OROMUCOSAL | Status: DC

## 2022-02-05 MED ORDER — DILTIAZEM HCL ER COATED BEADS 120 MG PO CP24
120.0000 mg | ORAL_CAPSULE | Freq: Every day | ORAL | Status: DC
  Filled 2022-02-05: qty 1

## 2022-02-05 MED ORDER — FENTANYL CITRATE (PF) 100 MCG/2ML IJ SOLN
25.0000 ug | INTRAMUSCULAR | Status: DC | PRN
  Administered 2022-02-05: 50 ug via INTRAVENOUS

## 2022-02-05 MED ORDER — PROPOFOL 10 MG/ML IV BOLUS
INTRAVENOUS | Status: DC | PRN
  Administered 2022-02-05: 150 mg via INTRAVENOUS

## 2022-02-05 MED ORDER — BUPIVACAINE LIPOSOME 1.3 % IJ SUSP
INTRAMUSCULAR | Status: AC
  Filled 2022-02-05: qty 20

## 2022-02-05 MED ORDER — DEXAMETHASONE SODIUM PHOSPHATE 10 MG/ML IJ SOLN
INTRAMUSCULAR | Status: DC | PRN
  Administered 2022-02-05: 10 mg via INTRAVENOUS

## 2022-02-05 MED ORDER — CARVEDILOL 6.25 MG PO TABS
3.1250 mg | ORAL_TABLET | Freq: Two times a day (BID) | ORAL | Status: DC
  Administered 2022-02-06: 3.125 mg via ORAL
  Filled 2022-02-05: qty 1

## 2022-02-05 MED ORDER — PROCHLORPERAZINE MALEATE 10 MG PO TABS: 10.0000 mg | ORAL_TABLET | Freq: Four times a day (QID) | ORAL | Status: DC | PRN

## 2022-02-05 MED ORDER — BUPIVACAINE LIPOSOME 1.3 % IJ SUSP
INTRAMUSCULAR | Status: DC | PRN
  Administered 2022-02-05: 20 mL

## 2022-02-05 MED ORDER — ACETAMINOPHEN 500 MG PO TABS
ORAL_TABLET | ORAL | Status: AC
  Administered 2022-02-05: 1000 mg
  Filled 2022-02-05: qty 2

## 2022-02-05 MED ORDER — BUPIVACAINE-EPINEPHRINE (PF) 0.25% -1:200000 IJ SOLN
INTRAMUSCULAR | Status: AC
  Filled 2022-02-05: qty 30

## 2022-02-05 MED ORDER — FENTANYL CITRATE (PF) 100 MCG/2ML IJ SOLN
INTRAMUSCULAR | Status: AC
  Administered 2022-02-05: 25 ug via INTRAVENOUS
  Filled 2022-02-05: qty 2

## 2022-02-05 MED ORDER — ROCURONIUM BROMIDE 100 MG/10ML IV SOLN
INTRAVENOUS | Status: DC | PRN
  Administered 2022-02-05: 20 mg via INTRAVENOUS
  Administered 2022-02-05: 40 mg via INTRAVENOUS
  Administered 2022-02-05: 10 mg via INTRAVENOUS
  Administered 2022-02-05: 20 mg via INTRAVENOUS

## 2022-02-05 MED ORDER — EPHEDRINE SULFATE (PRESSORS) 50 MG/ML IJ SOLN
INTRAMUSCULAR | Status: DC | PRN
  Administered 2022-02-05: 5 mg via INTRAVENOUS
  Administered 2022-02-05: 10 mg via INTRAVENOUS
  Administered 2022-02-05 (×3): 5 mg via INTRAVENOUS

## 2022-02-05 MED ORDER — PHENYLEPHRINE 80 MCG/ML (10ML) SYRINGE FOR IV PUSH (FOR BLOOD PRESSURE SUPPORT)
PREFILLED_SYRINGE | INTRAVENOUS | Status: DC | PRN
  Administered 2022-02-05: 80 ug via INTRAVENOUS
  Administered 2022-02-05 (×2): 160 ug via INTRAVENOUS

## 2022-02-05 MED ORDER — ONDANSETRON 4 MG PO TBDP: 4.0000 mg | ORAL_TABLET | Freq: Four times a day (QID) | ORAL | Status: DC | PRN

## 2022-02-05 MED ORDER — ASPIRIN 81 MG PO TBEC
81.0000 mg | DELAYED_RELEASE_TABLET | Freq: Every day | ORAL | Status: DC
  Administered 2022-02-05 – 2022-02-06 (×2): 81 mg via ORAL
  Filled 2022-02-05 (×2): qty 1

## 2022-02-05 MED ORDER — CHLORHEXIDINE GLUCONATE 0.12 % MT SOLN
OROMUCOSAL | Status: AC
  Filled 2022-02-05: qty 15

## 2022-02-05 MED ORDER — HYDROCODONE-ACETAMINOPHEN 7.5-325 MG PO TABS
1.0000 | ORAL_TABLET | Freq: Once | ORAL | Status: DC | PRN
  Filled 2022-02-05: qty 1

## 2022-02-05 MED ORDER — VISTASEAL 10 ML SINGLE DOSE KIT
PACK | CUTANEOUS | Status: DC | PRN
  Administered 2022-02-05: 10 mL via TOPICAL

## 2022-02-05 MED ORDER — DROPERIDOL 2.5 MG/ML IJ SOLN: 0.6250 mg | Freq: Once | INTRAMUSCULAR | Status: DC | PRN

## 2022-02-05 MED ORDER — FLUTICASONE PROPIONATE 50 MCG/ACT NA SUSP: 1.0000 | Freq: Every day | NASAL | Status: DC | PRN

## 2022-02-05 MED ORDER — CELECOXIB 200 MG PO CAPS
ORAL_CAPSULE | ORAL | Status: AC
  Filled 2022-02-05: qty 1

## 2022-02-05 MED ORDER — LACTATED RINGERS IV SOLN: INTRAVENOUS | Status: DC

## 2022-02-05 MED ORDER — ONDANSETRON HCL 4 MG/2ML IJ SOLN: 4.0000 mg | Freq: Four times a day (QID) | INTRAMUSCULAR | Status: DC | PRN

## 2022-02-05 MED ORDER — APREPITANT 40 MG PO CAPS
40.0000 mg | ORAL_CAPSULE | Freq: Once | ORAL | Status: AC
  Administered 2022-02-05: 40 mg via ORAL

## 2022-02-05 MED ORDER — BUPIVACAINE-EPINEPHRINE 0.25% -1:200000 IJ SOLN
INTRAMUSCULAR | Status: DC | PRN
  Administered 2022-02-05: 30 mL

## 2022-02-05 MED ORDER — ACETAMINOPHEN 325 MG PO TABS: 325.0000 mg | ORAL_TABLET | ORAL | Status: DC | PRN

## 2022-02-05 SURGICAL SUPPLY — 78 items
ADH SKN CLS APL DERMABOND .7 (GAUZE/BANDAGES/DRESSINGS) ×2
APL LAPSCP 35 DL APL RGD (MISCELLANEOUS) ×2
APL PRP STRL LF DISP 70% ISPRP (MISCELLANEOUS)
APPLICATOR VISTASEAL 35 (MISCELLANEOUS) IMPLANT
APPLIER CLIP 11 MED OPEN (CLIP)
APR CLP MED 11 20 MLT OPN (CLIP)
BLADE CLIPPER SURG (BLADE) ×2 IMPLANT
BLADE SURG 15 STRL LF DISP TIS (BLADE) ×2 IMPLANT
BLADE SURG 15 STRL SS (BLADE) ×2
CANNULA REDUC XI 12-8 STAPL (CANNULA) ×2
CANNULA REDUCER 12-8 DVNC XI (CANNULA) ×2 IMPLANT
CHLORAPREP W/TINT 26 (MISCELLANEOUS) ×2 IMPLANT
CLIP APPLIE 11 MED OPEN (CLIP) ×2 IMPLANT
COVER LIGHT HANDLE STERIS (MISCELLANEOUS) IMPLANT
DERMABOND ADVANCED .7 DNX12 (GAUZE/BANDAGES/DRESSINGS) ×2 IMPLANT
DRAPE 3/4 80X56 (DRAPES) ×2 IMPLANT
DRAPE ARM DVNC X/XI (DISPOSABLE) ×8 IMPLANT
DRAPE COLUMN DVNC XI (DISPOSABLE) ×2 IMPLANT
DRAPE DA VINCI XI ARM (DISPOSABLE) ×8
DRAPE DA VINCI XI COLUMN (DISPOSABLE) ×2
DRAPE INCISE IOBAN 66X45 STRL (DRAPES) ×2 IMPLANT
DRAPE LAPAROTOMY 77X122 PED (DRAPES) ×2 IMPLANT
DRSG TELFA 3X8 NADH STRL (GAUZE/BANDAGES/DRESSINGS) ×2 IMPLANT
ELECT CAUTERY BLADE 6.4 (BLADE) ×2 IMPLANT
ELECT REM PT RETURN 9FT ADLT (ELECTROSURGICAL) ×2
ELECTRODE REM PT RTRN 9FT ADLT (ELECTROSURGICAL) ×2 IMPLANT
GAUZE 4X4 16PLY ~~LOC~~+RFID DBL (SPONGE) ×2 IMPLANT
GLOVE BIO SURGEON STRL SZ7 (GLOVE) ×6 IMPLANT
GOWN STRL REUS W/ TWL LRG LVL3 (GOWN DISPOSABLE) ×8 IMPLANT
GOWN STRL REUS W/TWL LRG LVL3 (GOWN DISPOSABLE) ×12
GRASPER LAPSCPC 5X45 DSP (INSTRUMENTS) ×2 IMPLANT
IRRIGATION STRYKERFLOW (MISCELLANEOUS) IMPLANT
IRRIGATOR STRYKERFLOW (MISCELLANEOUS)
IV NS 1000ML (IV SOLUTION)
IV NS 1000ML BAXH (IV SOLUTION) IMPLANT
KIT PINK PAD W/HEAD ARE REST (MISCELLANEOUS) ×2
KIT PINK PAD W/HEAD ARM REST (MISCELLANEOUS) ×2 IMPLANT
KIT TURNOVER CYSTO (KITS) ×2 IMPLANT
LABEL OR SOLS (LABEL) ×2 IMPLANT
MANIFOLD NEPTUNE II (INSTRUMENTS) ×2 IMPLANT
MESH BIO-A 7X10 SYN MAT (Mesh General) IMPLANT
MESH VENTRALEX ST 2.5 CRC MED (Mesh General) ×2 IMPLANT
NEEDLE HYPO 22GX1.5 SAFETY (NEEDLE) ×2 IMPLANT
NS IRRIG 500ML POUR BTL (IV SOLUTION) ×2 IMPLANT
OBTURATOR OPTICAL STANDARD 8MM (TROCAR) ×2
OBTURATOR OPTICAL STND 8 DVNC (TROCAR) ×2
OBTURATOR OPTICALSTD 8 DVNC (TROCAR) ×2 IMPLANT
PACK BASIN MINOR ARMC (MISCELLANEOUS) ×2 IMPLANT
PACK LAP CHOLECYSTECTOMY (MISCELLANEOUS) ×2 IMPLANT
PENCIL SMOKE EVACUATOR (MISCELLANEOUS) ×2 IMPLANT
SEAL CANN UNIV 5-8 DVNC XI (MISCELLANEOUS) ×6 IMPLANT
SEAL XI 5MM-8MM UNIVERSAL (MISCELLANEOUS) ×6
SEALER VESSEL DA VINCI XI (MISCELLANEOUS) ×2
SEALER VESSEL EXT DVNC XI (MISCELLANEOUS) ×2 IMPLANT
SOLUTION ELECTROLUBE (MISCELLANEOUS) ×2 IMPLANT
SPIKE FLUID TRANSFER (MISCELLANEOUS) ×2 IMPLANT
SPONGE T-LAP 18X18 ~~LOC~~+RFID (SPONGE) ×2 IMPLANT
STAPLER CANNULA SEAL DVNC XI (STAPLE) ×2 IMPLANT
STAPLER CANNULA SEAL XI (STAPLE) ×2
SUT ETHIBOND NAB MO 7 #0 18IN (SUTURE) ×2 IMPLANT
SUT MNCRL 4-0 (SUTURE) ×2
SUT MNCRL 4-0 27XMFL (SUTURE) ×2
SUT MNCRL AB 4-0 PS2 18 (SUTURE) ×2 IMPLANT
SUT SILK 2 0 SH (SUTURE) ×6 IMPLANT
SUT VIC AB 3-0 SH 27 (SUTURE) ×2
SUT VIC AB 3-0 SH 27X BRD (SUTURE) ×2 IMPLANT
SUT VICRYL 0 AB UR-6 (SUTURE) ×4 IMPLANT
SUT VLOC 90 S/L VL9 GS22 (SUTURE) ×2 IMPLANT
SUTURE MNCRL 4-0 27XMF (SUTURE) ×2 IMPLANT
SYR 20ML LL LF (SYRINGE) ×2 IMPLANT
SYR 30ML LL (SYRINGE) ×2 IMPLANT
SYS BAG RETRIEVAL 10MM (BASKET)
SYSTEM BAG RETRIEVAL 10MM (BASKET) IMPLANT
TRAP FLUID SMOKE EVACUATOR (MISCELLANEOUS) ×2 IMPLANT
TRAY FOLEY SLVR 16FR LF STAT (SET/KITS/TRAYS/PACK) ×2 IMPLANT
TROCAR XCEL NON-BLD 5MMX100MML (ENDOMECHANICALS) ×2 IMPLANT
TUBING EVAC SMOKE HEATED PNEUM (TUBING) ×2 IMPLANT
WATER STERILE IRR 500ML POUR (IV SOLUTION) ×2 IMPLANT

## 2022-02-05 NOTE — Anesthesia Procedure Notes (Signed)

## 2022-02-05 NOTE — Op Note (Signed)
Robotic assisted laparoscopic  repair of  paraesophageal hernia with Bio-A Mesh and Nissen fundoplication  Repair of umbilical incisional hernia 3 cms using 6.4 cms ventralex Mesh  Pre-operative Diagnosis: GERD, hiatal hernia  Post-operative Diagnosis: same  Procedure:  Robotic assisted laparoscopic  repair of  paraesophageal hernia with Bio-A Mesh and Nissen fundoplication  Repair of umbilical incisional hernia 3 cms using 6.4 cms ventralex Mesh  Surgeon: Caroleen Hamman, MD FACS  Assistant:  [place] . Required due to the complexity of the case the need for exposure and lack of first assist.  Anesthesia: Gen. with endotracheal tube  Findings: Giant type III paraesophageal hernia Loose wrap 360 degree over 50 FR Bougie 3cms ventral hernia periumbilical region  Estimated Blood Loss: 15cc       Specimens: hernia sac       Complications: none   Procedure Details  The patient was seen again in the Holding Room. The benefits, complications, treatment options, and expected outcomes were discussed with the patient. The risks of bleeding, infection, recurrence of symptoms, failure to resolve symptoms,  esophageal damage, Dysphagia, bowel injury, any of which could require further surgery were reviewed with the patient. The likelihood of improving the patient's symptoms with return to their baseline status is good.  The patient and/or family concurred with the proposed plan, giving informed consent.  The patient was taken to Operating Room, identified  and the procedure verified.  A Time Out was held and the above information confirmed.  Prior to the induction of general anesthesia, antibiotic prophylaxis was administered. VTE prophylaxis was in place. General endotracheal anesthesia was then administered and tolerated well. After the induction, the abdomen was prepped with Chloraprep and draped in the sterile fashion. The patient was positioned in the supine position.  Cut  down technique was used to enter the abdominal cavity a hernia sac was found and dissected circumferentially. The sac was excised and the abdominal cavity was entered under direct visualization. A Hasson trochar was placed after two vicryl stitches were anchored to the fascia. Pneumoperitoneum was then created with CO2 and tolerated well without any adverse changes in the patient's vital signs.  Three 8-mm ports were placed under direct vision. An additional 5 mm regular laparoscopic port was placed to assist with retraction and exposure. No injuries observed   The patient was positioned  in reverse Trendelenburg, robot was brought to the surgical field and docked in the standard fashion.  We made sure all the instrumentation was kept indirect view at all times and that there were no collision between the arms. I scrubbed out and went to the console.  I used a robtic arm to retract the liver, the vessel sealer on my right hand and a forced bipolar grasper on my left hand.  There is along the extra 5 mm port allow me ample exposure and the ability to perform meticulous dissection  We Started dividing the lesser omentum via the pars flaccida.  We Were able to dissect the lesser curvature of the stomach and  dissected the fundus free from the right and left crus.  We circumferentially dissected the GE junction.  The hernia sac was also completely reduced and we were able to bring the stomach into the intra-abdominal position.  Attention then was turned to the greater curvature where the short gastrics were divided with sealer device.  We were able to identify the left crus and again were able to make sure there was a good circumferential  dissection and that the hernia sac was completely excised.  We did perform a good  dissection within the mediastinum to allow a complete reduction of the sac and a to completely allow an intra-abdominal Nissen fundoplication. The descending aorta was tortuous. Two strips of  Bio-A were used as a pledget and  2-0V lock suture was used to approximate the two  crus in  a running suture.  We Asked anesthesia to place a 50 French bougie and this went easily.  We also observe trajectory of the bougie. 360 degree Nissen fundoplication was created with multiple 2-0 silk sutures and we placed 3 stitches taking some of the esophagus within that bite.  The fundoplication measured approximately 3-1/2 cm and he was floppy. I was very happy with the way the fundoplication laid and the repair of the hernia. Hernia sac was removed and sent for pathology.  Inspection of the  upper quadrant was performed. No bleeding, bile  Or esophageal injuries leaks, or bowel injuries were noted. Robotic instruments and robotic arms were undocked in the standard fashion. All the needles were removed under direct visualization.   I scrubbed back in.  Pneumoperitoneum was released.   The ventral defect was closed after inserted a ventralex mesh 6.4 cm in an underlay fashion, it was secured in 4 quadrants w transfacial sutures, the defect was closed frther with multiple 0 ethibonds. Marland Kitchen 4-0 subcuticular Monocryl was used to close the skin. Liposomal marcaine was injected to all the incisions sites.  Dermabond was  applied.  The patient was then extubated and brought to the recovery room in stable condition. Sponge, lap, and needle counts were correct at closure and at the conclusion of the case.               Caroleen Hamman, MD, FACS

## 2022-02-05 NOTE — Anesthesia Preprocedure Evaluation (Addendum)
Anesthesia Evaluation  Patient identified by MRN, date of birth, ID band Patient awake    Reviewed: Allergy & Precautions, NPO status , Patient's Chart, lab work & pertinent test results  History of Anesthesia Complications (+) PONV and history of anesthetic complications  Airway Mallampati: IV  TM Distance: >3 FB Neck ROM: full    Dental  (+) Chipped, Missing, Poor Dentition   Pulmonary neg pulmonary ROS, pneumonia, resolved, former smoker,    Pulmonary exam normal        Cardiovascular hypertension, + CAD  negative cardio ROS Normal cardiovascular exam  Per cardiology, "this patient is optimized for surgery and may proceed with the planned procedural course with a LOW risk of significant perioperative cardiovascular complications".    Neuro/Psych PSYCHIATRIC DISORDERS Anxiety negative neurological ROS  negative psych ROS   GI/Hepatic negative GI ROS, Neg liver ROS, hiatal hernia, GERD  ,  Endo/Other  negative endocrine ROS  Renal/GU Renal disease     Musculoskeletal  (+) Arthritis ,   Abdominal   Peds  Hematology negative hematology ROS (+)   Anesthesia Other Findings Past Medical History: No date: Anxiety No date: Aortic atherosclerosis (HCC) No date: Arthritis No date: Bilateral renal cysts 09/25/2001: Coronary artery disease     Comment:  a.) LHC 09/25/2001: EF 78%. 20% pLAD - med mgmt; b.) LHC              06/09/2003: EF 63%. 20% pLCx, 20% pLAD, 50% mLAD, 50%               dLAD - med mgmt. 59/97/7414: Diastolic dysfunction     Comment:  a.) Stress echo 12/30/2017: ED >55%, triv PR, mild               AR.MR, mod TR, G1DD; b.) TTE 03/30/2020: EF >55%, mild               LVH, mild panvalvular regurgitation. No date: Gastritis No date: GERD (gastroesophageal reflux disease) No date: Hepatic steatosis 01/11/2022: History of 2019 novel coronavirus disease (COVID-19) No date: History of hiatal hernia No  date: Hyperlipemia No date: Hyperplastic colon polyp No date: Hypertension No date: Pneumonia No date: PONV (postoperative nausea and vomiting)     Comment:  a.) PONV following orthopedic surgery (hip) in 2022 No date: Prediabetes No date: Redundant colon No date: Tubular adenoma of colon No date: Umbilical hernia  Past Surgical History: 1971: APPENDECTOMY 1980: ARTHRODESIS 1980: BACK SURGERY 12/12/2016: COLONOSCOPY WITH PROPOFOL; N/A     Comment:  Procedure: COLONOSCOPY WITH PROPOFOL;  Surgeon:               Lollie Sails, MD;  Location: [facility] ENDOSCOPY;                Service: Endoscopy;  Laterality: N/A; 07/14/2019: COLONOSCOPY WITH PROPOFOL; N/A     Comment:  Procedure: COLONOSCOPY WITH PROPOFOL;  Surgeon: [place],               Benay Pike, MD;  Location: [facility] ENDOSCOPY;  Service:               Gastroenterology;  Laterality: N/A; 12/27/2021: ESOPHAGOGASTRODUODENOSCOPY; N/A     Comment:  Procedure: ESOPHAGOGASTRODUODENOSCOPY (EGD);  Surgeon:               Benjamine Sprague, DO;  Location: [facility] ENDOSCOPY;  Service:               General;  Laterality: N/A; 12/12/2016: ESOPHAGOGASTRODUODENOSCOPY (EGD) WITH  PROPOFOL; N/A     Comment:  Procedure: ESOPHAGOGASTRODUODENOSCOPY (EGD) WITH               PROPOFOL;  Surgeon: Lollie Sails, MD;  Location:               [facility] ENDOSCOPY;  Service: Endoscopy;  Laterality: N/A; 07/14/2019: ESOPHAGOGASTRODUODENOSCOPY (EGD) WITH PROPOFOL; N/A     Comment:  Procedure: ESOPHAGOGASTRODUODENOSCOPY (EGD) WITH               PROPOFOL;  Surgeon: Toledo, Benay Pike, MD;  Location:               [facility] ENDOSCOPY;  Service: Gastroenterology;  Laterality:               N/A; 1985: LAPAROSCOPIC UNILATERAL SALPINGO OOPHERECTOMY     Comment:  right 09/25/2001: LEFT HEART CATH AND CORONARY ANGIOGRAPHY; Left     Comment:  Procedure: LEFT HEART CATH AND CORONARY ANGIOGRAPHY;               Location: [place]; Surgeon: Katrine Coho, MD 06/09/2003: LEFT HEART CATH  AND CORONARY ANGIOGRAPHY; Left     Comment:  Procedure: LEFT HEART CATH AND CORONARY ANGIOGRAPHY;               Location: [place]; Surgeon: Katrine Coho, MD No date: ORIF ANKLE FRACTURE; Right No date: REPLACEMENT TOTAL HIP W/  RESURFACING IMPLANTS; Right 1948: TONSILLECTOMY 1971: TUBAL LIGATION 1985: VAGINAL HYSTERECTOMY     Comment:  d/t heavy bleeding  BMI    Body Mass Index: 28.56 kg/m      Reproductive/Obstetrics negative OB ROS                            Anesthesia Physical Anesthesia Plan  ASA: 3  Anesthesia Plan: General ETT and General   Post-op Pain Management: Dilaudid IV   Induction: Intravenous  PONV Risk Score and Plan: 4 or greater and Ondansetron, Aprepitant, Midazolam, Treatment may vary due to age or medical condition and Dexamethasone  Airway Management Planned: Oral ETT  Additional Equipment:   Intra-op Plan:   Post-operative Plan: Extubation in OR  Informed Consent: I have reviewed the patients History and Physical, chart, labs and discussed the procedure including the risks, benefits and alternatives for the proposed anesthesia with the patient or authorized representative who has indicated his/her understanding and acceptance.     Dental Advisory Given  Plan Discussed with: Anesthesiologist, CRNA and Surgeon  Anesthesia Plan Comments: (Patient consented for risks of anesthesia including but not limited to:  - adverse reactions to medications - damage to eyes, teeth, lips or other oral mucosa - nerve damage due to positioning  - sore throat or hoarseness - Damage to heart, brain, nerves, lungs, other parts of body or loss of life  Patient voiced understanding.)        Anesthesia Quick Evaluation

## 2022-02-05 NOTE — Plan of Care (Signed)

## 2022-02-05 NOTE — Interval H&P Note (Signed)
History and Physical Interval Note:  02/05/2022 7:23 AM  Amanda Duke  has presented today for surgery, with the diagnosis of Hiatal hernia.  The various methods of treatment have been discussed with the patient and family. After consideration of risks, benefits and other options for treatment, the patient has consented to  Procedure(s): XI ROBOTIC ASSISTED PARAESOPHAGEAL HERNIA REPAIR, RNFA to assist (N/A) HERNIA REPAIR UMBILICAL ADULT (N/A) as a surgical intervention.  The patient's history has been reviewed, patient examined, no change in status, stable for surgery.  I have reviewed the patient's chart and labs.  Questions were answered to the patient's satisfaction.     [place]

## 2022-02-05 NOTE — Anesthesia Postprocedure Evaluation (Signed)
Anesthesia Post Note  Patient: CADIENCE BRADFIELD  Procedure(s) Performed: XI ROBOTIC ASSISTED PARAESOPHAGEAL HERNIA REPAIR, RNFA to assist [place] OF MESH  Patient location during evaluation: PACU Anesthesia Type: General Level of consciousness: awake and alert Pain management: pain level controlled Vital Signs Assessment: post-procedure vital signs reviewed and stable Respiratory status: spontaneous breathing, nonlabored ventilation, respiratory function stable and patient connected to nasal cannula oxygen Cardiovascular status: blood pressure returned to baseline and stable Postop Assessment: no apparent nausea or vomiting Anesthetic complications: no   No notable events documented.   Last Vitals:  Vitals:   02/05/22 1130 02/05/22 1141  BP: (!) 97/47 (!) 95/50  Pulse: (!) 55 (!) 58  Resp: 18 16  Temp:    SpO2: 94% 94%    Last Pain:  Vitals:   02/05/22 1130  TempSrc:   PainSc: Asleep                 Alphonsus Sias

## 2022-02-05 NOTE — Transfer of Care (Signed)
Immediate Anesthesia Transfer of Care Note  Patient: Amanda Duke  Procedure(s) Performed: XI ROBOTIC ASSISTED PARAESOPHAGEAL HERNIA REPAIR, RNFA to assist [place] OF MESH  Patient Location: PACU  Anesthesia Type:General  Level of Consciousness: awake, drowsy and patient cooperative  Airway & Oxygen Therapy: Patient Spontanous Breathing and Patient connected to face mask oxygen  Post-op Assessment: Report given to RN and Post -op Vital signs reviewed and stable  Post vital signs: Reviewed and stable  Last Vitals:  Vitals Value Taken Time  BP    Temp    Pulse 64 02/05/22 1043  Resp 21 02/05/22 1043  SpO2 100 % 02/05/22 1043  Vitals shown include unvalidated device data.  Last Pain:  Vitals:   02/05/22 0624  TempSrc: Oral  PainSc: 0-No pain         Complications: No notable events documented.

## 2022-02-06 ENCOUNTER — Observation Stay: Payer: Medicare Other

## 2022-02-06 ENCOUNTER — Encounter: Payer: Self-pay | Admitting: Surgery

## 2022-02-06 DIAGNOSIS — K449 Diaphragmatic hernia without obstruction or gangrene: Secondary | ICD-10-CM | POA: Diagnosis not present

## 2022-02-06 DIAGNOSIS — Z87891 Personal history of nicotine dependence: Secondary | ICD-10-CM | POA: Diagnosis not present

## 2022-02-06 DIAGNOSIS — K224 Dyskinesia of esophagus: Secondary | ICD-10-CM | POA: Diagnosis not present

## 2022-02-06 DIAGNOSIS — I10 Essential (primary) hypertension: Secondary | ICD-10-CM | POA: Diagnosis not present

## 2022-02-06 DIAGNOSIS — I2510 Atherosclerotic heart disease of native coronary artery without angina pectoris: Secondary | ICD-10-CM | POA: Diagnosis not present

## 2022-02-06 DIAGNOSIS — R112 Nausea with vomiting, unspecified: Secondary | ICD-10-CM | POA: Diagnosis not present

## 2022-02-06 DIAGNOSIS — K219 Gastro-esophageal reflux disease without esophagitis: Secondary | ICD-10-CM | POA: Diagnosis not present

## 2022-02-06 DIAGNOSIS — Z23 Encounter for immunization: Secondary | ICD-10-CM | POA: Diagnosis not present

## 2022-02-06 LAB — CBC
HCT: 33.9 % — ABNORMAL LOW (ref 36.0–46.0)
Hemoglobin: 10.6 g/dL — ABNORMAL LOW (ref 12.0–15.0)
MCH: 25.7 pg — ABNORMAL LOW (ref 26.0–34.0)
MCHC: 31.3 g/dL (ref 30.0–36.0)
MCV: 82.1 fL (ref 80.0–100.0)
Platelets: 226 10*3/uL (ref 150–400)
RBC: 4.13 MIL/uL (ref 3.87–5.11)
RDW: 16.3 % — ABNORMAL HIGH (ref 11.5–15.5)
WBC: 10.6 10*3/uL — ABNORMAL HIGH (ref 4.0–10.5)
nRBC: 0 % (ref 0.0–0.2)

## 2022-02-06 LAB — BASIC METABOLIC PANEL
Anion gap: 8 (ref 5–15)
BUN: 13 mg/dL (ref 8–23)
CO2: 23 mmol/L (ref 22–32)
Calcium: 8.9 mg/dL (ref 8.9–10.3)
Chloride: 108 mmol/L (ref 98–111)
Creatinine, Ser: 0.99 mg/dL (ref 0.44–1.00)
GFR, Estimated: 57 mL/min — ABNORMAL LOW (ref >60)
Glucose, Bld: 137 mg/dL — ABNORMAL HIGH (ref 70–99)
Potassium: 4 mmol/L (ref 3.5–5.1)
Sodium: 139 mmol/L (ref 135–145)

## 2022-02-06 LAB — SURGICAL PATHOLOGY

## 2022-02-06 MED ORDER — OXYCODONE HCL 5 MG PO TABS: 5.0000 mg | ORAL_TABLET | Freq: Four times a day (QID) | ORAL | 0 refills | Status: DC | PRN

## 2022-02-06 MED ORDER — IOHEXOL 300 MG/ML  SOLN
150.0000 mL | Freq: Once | INTRAMUSCULAR | Status: AC | PRN
  Administered 2022-02-06: 50 mL via ORAL

## 2022-02-06 NOTE — TOC CM/SW Note (Signed)
Patient has orders to discharge home today. Chart reviewed. No TOC needs identified. CSW signing off.  Shacoria Latif, CSW [phone]  

## 2022-02-06 NOTE — Plan of Care (Signed)

## 2022-02-06 NOTE — Care Management Obs Status (Signed)
[facility] NOTIFICATION   Patient Details  Name: Amanda Duke MRN: [id number] Date of Birth: [birth year]   Medicare Observation Status Notification Given:  Yes    Colen Darling, [place] 02/06/2022, 2:24 PM

## 2022-02-06 NOTE — Discharge Summary (Signed)
[facility] SURGICAL DISCHARGE SUMMARY  Patient ID: Amanda Duke MRN: [id number] DOB/AGE: [birth year] 81 y.o.  Admit date: 02/05/2022 Discharge date: 02/06/2022  Discharge Diagnoses Patient Active Problem List   Diagnosis Date Noted   S/P repair of paraesophageal hernia 02/05/2022   Ventral hernia without obstruction or gangrene     Consultants None  Procedures 02/05/2022: Robotic assisted laparoscopic paraesophageal hernia repair with Nissen fundoplication  Umbilical hernia repair (3 cm)  HPI: Amanda Duke is a 81 y.o. female with history of hiatal hernia and umbilical hernia who presents to [facility] on 10/10 for repair with Dr Dahlia Byes.   Hospital Course: Informed consent was obtained and documented, and patient underwent uneventful robotic assisted laparoscopic paraesophageal hernia repair with Nissen fundoplication and umbilical hernia repair (3 cm) (Dr pabon, 02/05/2022).  Post-operatively, patient did well. She did endorse some trouble swallowing but UGI on POD1 was reassuring. Advancement of patient's diet and ambulation were well-tolerated. The remainder of patient's hospital course was essentially unremarkable, and discharge planning was initiated accordingly with patient safely able to be discharged home with appropriate discharge instructions, pain control, and outpatient follow-up after all of her questions were answered to her expressed satisfaction.   Discharge Condition: Good   Physical Examination:  Constitutional: Well appearing female, NAD Pulmonary: Normal effort, no respiratory distress Gastrointestinal: Soft, mild incisional soreness, non-distended, no rebound/guarding Skin: Laparoscopic incisions are CDI with dermabond, some early ecchymosis, no erythema or drainage    Allergies as of 02/06/2022       Reactions   Amoxicillin Hives   Patient tolerated Ceftriaxone in ED on 01/10/19.    Clindamycin Diarrhea   Gemfibrozil Other (See  Comments)   muscle ache   Levofloxacin Other (See Comments)   Muscle ache for several months   Metoprolol Other (See Comments)   "Lowers blood pressure extremely low"   Simvastatin Other (See Comments)   muscle ache   Macrobid [nitrofurantoin Macrocrystal] Diarrhea   Niacin Rash   Nystatin Rash   Nystatin-triamcinolone Rash   Sulfa Antibiotics Rash   Family history of medication allergy        Medication List     TAKE these medications    acetaminophen 500 MG tablet Commonly known as: TYLENOL Take 1,000 mg by mouth every 6 (six) hours as needed for moderate pain.   aspirin EC 81 MG tablet Take 81 mg by mouth daily. Swallow whole.   carvedilol 3.125 MG tablet Commonly known as: COREG Take 3.125 mg by mouth 2 (two) times daily with a meal.   cholecalciferol 1000 units tablet Commonly known as: VITAMIN D Take 1,000 Units by mouth daily.   clonazePAM 0.5 MG tablet Commonly known as: KLONOPIN Take 1 tablet by mouth as needed What changed:  how much to take how to take this when to take this reasons to take this   diltiazem 120 MG 24 hr capsule Commonly known as: CARDIZEM CD Take 120 mg by mouth every morning.   fluticasone 50 MCG/ACT nasal spray Commonly known as: FLONASE Place 1 spray into both nostrils daily.   ibuprofen 600 MG tablet Commonly known as: ADVIL Take 1 tablet (600 mg total) by mouth 2 (two) times daily as needed. What changed: reasons to take this   icosapent Ethyl 1 g capsule Commonly known as: VASCEPA Take 2 g by mouth 2 (two) times daily.   linaclotide 290 MCG Caps capsule Commonly known as: Linzess Take 1 capsule (290 mcg total) by mouth daily. What changed: when  to take this   losartan 100 MG tablet Commonly known as: COZAAR Take 100 mg by mouth every morning.   MUCINEX ALLERGY PO Take 1 tablet by mouth daily at 6 (six) AM.   oxyCODONE 5 MG immediate release tablet Commonly known as: Oxy IR/ROXICODONE Take 1 tablet (5 mg  total) by mouth every 6 (six) hours as needed for severe pain or breakthrough pain.   pantoprazole 40 MG tablet Commonly known as: PROTONIX Take 40 mg by mouth in the morning.   rosuvastatin 40 MG tablet Commonly known as: CRESTOR Take 40 mg by mouth at bedtime.   solifenacin 5 MG tablet Commonly known as: VESICARE TAKE 1 TABLET (5 MG TOTAL) BY MOUTH DAILY. What changed:  when to take this reasons to take this          Follow-up Information     Pabon, [place] F, MD. Schedule an appointment as soon as possible for a visit in 2 week(s).   Specialty: General Surgery Why: s/p laparoscopic paraesophageal hernia repair and Nissen fundoplication Contact information: [address] [place] [address] [phone]                  Time spent on discharge management including discussion of hospital course, clinical condition, outpatient instructions, prescriptions, and follow up with the patient and members of the medical team: >30 minutes  -- Edison Simon , PA-C [facility]  02/06/2022, 12:42 PM [phone] M-F: 7am - 4pm

## 2022-02-06 NOTE — Discharge Instructions (Signed)
In addition to included general post-operative instructions,  Diet: Recommend following Nissen diet restrictions/recommendation for a minimum of 4 weeks. You were provided a handout regarding this. .   Activity: No heavy lifting >20 pounds (children, pets, laundry, garbage) or strenuous activity for 4-6 weeks, but light activity and walking are encouraged. Do not drive or drink alcohol if taking narcotic pain medications or having pain that might distract from driving.  Wound care: 2 days after surgery (10/12), you may shower/get incision wet with soapy water and pat dry (do not rub incisions), but no baths or submerging incision underwater until follow-up.   Medications: Resume all home medications. For mild to moderate pain: acetaminophen (Tylenol) or ibuprofen/naproxen (if no kidney disease). Combining Tylenol with alcohol can substantially increase your risk of causing liver disease. Narcotic pain medications, if prescribed, can be used for severe pain, though may cause nausea, constipation, and drowsiness. Do not combine Tylenol and Percocet (or similar) within a 6 hour period as Percocet (and similar) contain(s) Tylenol. If you do not need the narcotic pain medication, you do not need to fill the prescription.  Call office [phone]) at any time if any questions, worsening pain, fevers/chills, bleeding, drainage from incision site, or other concerns.

## 2022-02-06 NOTE — Progress Notes (Signed)
Discharge instructions, printed and reviewed in detail w/ patient. All IV's, foley removed and LDA's removed from chart. Patient stated she understood the education and information received and will call if any questions arise.

## 2022-02-08 ENCOUNTER — Encounter: Payer: Self-pay | Admitting: Emergency Medicine

## 2022-02-08 ENCOUNTER — Emergency Department: Payer: Medicare Other

## 2022-02-08 ENCOUNTER — Other Ambulatory Visit: Payer: Self-pay

## 2022-02-08 ENCOUNTER — Telehealth: Payer: Self-pay

## 2022-02-08 ENCOUNTER — Emergency Department
Admission: EM | Admit: 2022-02-08 | Discharge: 2022-02-08 | Disposition: A | Discharge status: Home / Self Care | Payer: Medicare Other | Attending: Emergency Medicine | Admitting: Emergency Medicine

## 2022-02-08 DIAGNOSIS — J90 Pleural effusion, not elsewhere classified: Secondary | ICD-10-CM | POA: Diagnosis not present

## 2022-02-08 DIAGNOSIS — I2510 Atherosclerotic heart disease of native coronary artery without angina pectoris: Secondary | ICD-10-CM | POA: Diagnosis not present

## 2022-02-08 DIAGNOSIS — I10 Essential (primary) hypertension: Secondary | ICD-10-CM | POA: Diagnosis not present

## 2022-02-08 DIAGNOSIS — R002 Palpitations: Secondary | ICD-10-CM | POA: Diagnosis not present

## 2022-02-08 DIAGNOSIS — K449 Diaphragmatic hernia without obstruction or gangrene: Secondary | ICD-10-CM | POA: Diagnosis not present

## 2022-02-08 DIAGNOSIS — R6889 Other general symptoms and signs: Secondary | ICD-10-CM | POA: Diagnosis not present

## 2022-02-08 DIAGNOSIS — Z743 Need for continuous supervision: Secondary | ICD-10-CM | POA: Diagnosis not present

## 2022-02-08 DIAGNOSIS — I499 Cardiac arrhythmia, unspecified: Secondary | ICD-10-CM | POA: Diagnosis not present

## 2022-02-08 DIAGNOSIS — R1110 Vomiting, unspecified: Secondary | ICD-10-CM | POA: Diagnosis not present

## 2022-02-08 LAB — CBC WITH DIFFERENTIAL/PLATELET
Abs Immature Granulocytes: 0.04 10*3/uL (ref 0.00–0.07)
Basophils Absolute: 0.1 10*3/uL (ref 0.0–0.1)
Basophils Relative: 1 %
Eosinophils Absolute: 0.1 10*3/uL (ref 0.0–0.5)
Eosinophils Relative: 1 %
HCT: 41.8 % (ref 36.0–46.0)
Hemoglobin: 13 g/dL (ref 12.0–15.0)
Immature Granulocytes: 1 %
Lymphocytes Relative: 19 %
Lymphs Abs: 1.5 10*3/uL (ref 0.7–4.0)
MCH: 25.6 pg — ABNORMAL LOW (ref 26.0–34.0)
MCHC: 31.1 g/dL (ref 30.0–36.0)
MCV: 82.4 fL (ref 80.0–100.0)
Monocytes Absolute: 0.9 10*3/uL (ref 0.1–1.0)
Monocytes Relative: 11 %
Neutro Abs: 5.5 10*3/uL (ref 1.7–7.7)
Neutrophils Relative %: 67 %
Platelets: 271 10*3/uL (ref 150–400)
RBC: 5.07 MIL/uL (ref 3.87–5.11)
RDW: 17 % — ABNORMAL HIGH (ref 11.5–15.5)
WBC: 8.1 10*3/uL (ref 4.0–10.5)
nRBC: 0 % (ref 0.0–0.2)

## 2022-02-08 LAB — BASIC METABOLIC PANEL
Anion gap: 11 (ref 5–15)
BUN: 21 mg/dL (ref 8–23)
CO2: 24 mmol/L (ref 22–32)
Calcium: 9.1 mg/dL (ref 8.9–10.3)
Chloride: 104 mmol/L (ref 98–111)
Creatinine, Ser: 1.05 mg/dL — ABNORMAL HIGH (ref 0.44–1.00)
GFR, Estimated: 53 mL/min — ABNORMAL LOW (ref >60)
Glucose, Bld: 111 mg/dL — ABNORMAL HIGH (ref 70–99)
Potassium: 3.7 mmol/L (ref 3.5–5.1)
Sodium: 139 mmol/L (ref 135–145)

## 2022-02-08 LAB — URINALYSIS, ROUTINE W REFLEX MICROSCOPIC
Bilirubin Urine: NEGATIVE
Glucose, UA: NEGATIVE mg/dL
Hgb urine dipstick: NEGATIVE
Ketones, ur: 20 mg/dL — AB
Leukocytes,Ua: NEGATIVE
Nitrite: NEGATIVE
Protein, ur: NEGATIVE mg/dL
Specific Gravity, Urine: 1.011 (ref 1.005–1.030)
pH: 5 (ref 5.0–8.0)

## 2022-02-08 LAB — TROPONIN I (HIGH SENSITIVITY)
Troponin I (High Sensitivity): 7 ng/L (ref <18)
Troponin I (High Sensitivity): 9 ng/L (ref <18)

## 2022-02-08 NOTE — ED Provider Notes (Signed)
[facility] Provider Note    Event Date/Time   First MD Initiated Contact with Patient 02/08/22 1725     (approximate)   History   Palpitations   HPI  CHALET KERWIN is a 81 y.o. female   Past medical history paraesophageal hernia repair 3 days ago, CAD, hyperlipidemia and hypertension who presents to the emergency department with a transient episode of palpitations experienced this afternoon while at rest.  No chest pain, mild dyspnea during this episode.  She has never been diagnosed with dysrhythmia in the past, no history of atrial fibrillation.  Since this is resolved, she has felt better.  No complaints at this time.  No preceding illnesses.  She has been recovering well from her surgery recently and is on a liquid diet, tolerating liquids, nausea or vomiting or diarrhea.  History was obtained via the patient and review of external medical notes including surgery on 02/05/2022      Physical Exam   Triage Vital Signs: ED Triage Vitals  Enc Vitals Group     BP 02/08/22 1429 (!) 183/99     Pulse Rate 02/08/22 1429 78     Resp 02/08/22 1429 16     Temp 02/08/22 1429 98.7 F (37.1 C)     Temp Source 02/08/22 1429 Oral     SpO2 02/08/22 1429 95 %     Weight 02/08/22 1427 168 lb 14 oz (76.6 kg)     Height 02/08/22 1427 5' 4.5" (1.638 m)     Head Circumference --      Peak Flow --      Pain Score 02/08/22 1427 0     Pain Loc --      Pain Edu? --      Excl. in [place]? --     Most recent vital signs: Vitals:   02/08/22 1429 02/08/22 1830  BP: (!) 183/99 (!) 151/88  Pulse: 78 66  Resp: 16 17  Temp: 98.7 F (37.1 C) 98.1 F (36.7 C)  SpO2: 95% 95%    General: Awake, no distress.  CV:  Good peripheral perfusion.  Resp:  Normal effort.  Abd:  No distention.  Nontender Other:  Awake alert cooperative and pleasant.  Moving all extremities, lung sounds clear, abdomen is soft and nontender to palpation.   ED Results / Procedures / Treatments    Labs (all labs ordered are listed, but only abnormal results are displayed) Labs Reviewed  CBC WITH DIFFERENTIAL/PLATELET - Abnormal; Notable for the following components:      Result Value   MCH 25.6 (*)    RDW 17.0 (*)    All other components within normal limits  BASIC METABOLIC PANEL - Abnormal; Notable for the following components:   Glucose, Bld 111 (*)    Creatinine, Ser 1.05 (*)    GFR, Estimated 53 (*)    All other components within normal limits  URINALYSIS, ROUTINE W REFLEX MICROSCOPIC - Abnormal; Notable for the following components:   Color, Urine STRAW (*)    APPearance CLEAR (*)    Ketones, ur 20 (*)    All other components within normal limits  TROPONIN I (HIGH SENSITIVITY)  TROPONIN I (HIGH SENSITIVITY)     I reviewed labs and they are notable for creatinine is 1.05  EKG  ED ECG REPORT I, Lucillie Garfinkel, the attending physician, personally viewed and interpreted this ECG.   Date: 02/08/2022  EKG Time: 1432  Rate: 83  Rhythm: normal EKG,  normal sinus rhythm  Axis: nl  Intervals: no ischemic changes, qtc 470   RADIOLOGY I independently reviewed and interpreted CXR and see no obvious focalities or pneumothorax   PROCEDURES:  Critical Care performed: No  Procedures   MEDICATIONS ORDERED IN ED: Medications - No data to display   IMPRESSION / MDM / Stem / ED COURSE  I reviewed the triage vital signs and the nursing notes.                              Differential diagnosis includes, but is not limited to, dysrhythmia, electrolyte derangement, ACS, dehydration, less likely PE wo chest pain or dyspnea    MDM: Patient with work-up that is largely unremarkable today with normal troponins and a normal-appearing EKG and normal electrolytes.  Chest x-ray with air likely from her recent surgery.  Has been asymptomatic in the emergency department.  At this time is safe for discharge with cardiology follow-up for Holter monitor or further  work-up as needed.  Given strict return precautions.  Dispo: After careful consideration of this patient's presentation, medical and social risk factors, and evaluation in the emergency department I engaged in shared decision making with the patient and/or their representative to consider admission or observation and this patient was ultimately discharged because negative work-up as above and patient feels well..   Patient's presentation is most consistent with acute presentation with potential threat to life or bodily function.       FINAL CLINICAL IMPRESSION(S) / ED DIAGNOSES   Final diagnoses:  Palpitations     Rx / DC Orders   ED Discharge Orders     None        Note:  This document was prepared using Dragon voice recognition software and may include unintentional dictation errors.    Lucillie Garfinkel, MD 02/08/22 2127

## 2022-02-08 NOTE — Discharge Instructions (Signed)
Talk to your heart doctor for an appointment next week.   Thank you for choosing [place] for your health care today!  Please see your primary doctor this week for a follow up appointment.   If you do not have a primary doctor call the following clinics to establish care:  If you have insurance:  [facility] [phone] [place] [address]   [facility]  [phone] [place] [address] [place] [address]   If you do not have insurance:  [facility]  [phone] [address], [place] [address]  Sometimes, in the early stages of certain disease courses it is difficult to detect in the emergency department evaluation -- so, it is important that you continue to monitor your symptoms and call your doctor right away or return to the emergency department if you develop any new or worsening symptoms.  It was my pleasure to care for you today.   [place] Brunette Jacelyn Grip, MD

## 2022-02-08 NOTE — ED Triage Notes (Signed)
C/O palpitations today.  Onset around 2 hours ago.  Patient states took an anxiety pill when palpitations started.  Recent hernia repair last  Tuesday, discharge from hospital on Wednesday.  States having difficulty taking losartan due to size of pill. Did take med yesterday.  Also reports decrease PO intake.

## 2022-02-08 NOTE — Telephone Encounter (Signed)
Patient had paraesophageal hernia repair 01/28/2022-she had questions as to what to eat.  She wanted to know if boost was ok and was told yes. Soft foods like creamed potatoes, applesauce. No breads or meats. Patient is doing well.

## 2022-02-08 NOTE — ED Provider Triage Note (Signed)
  Emergency Medicine Provider Triage Evaluation Note  Amanda Duke , a 81 y.o.female,  was evaluated in triage.  Pt complains of hypertension palpitations.  Patient states that she had surgery a few days ago.  Since then her blood pressures been elevated and she has additionally had palpitations that started this morning.  She states that she still feels palpitations now, though they have improved slightly.  Denies any pain at this time.  She does additionally state that she has had some urinary frequency recently as well.   Review of Systems  Positive: Urinary frequency, palpitations Negative: Denies fever, chest pain, vomiting  Physical Exam   Vitals:   02/08/22 1429  BP: (!) 183/99  Pulse: 78  Resp: 16  Temp: 98.7 F (37.1 C)  SpO2: 95%   Gen:   Awake, no distress   Resp:  Normal effort  MSK:   Moves extremities without difficulty  Other:    Medical Decision Making  Given the patient's initial medical screening exam, the following diagnostic evaluation has been ordered. The patient will be placed in the appropriate treatment space, once one is available, to complete the evaluation and treatment. I have discussed the plan of care with the patient and I have advised the patient that an ED physician or mid-level practitioner will reevaluate their condition after the test results have been received, as the results may give them additional insight into the type of treatment they may need.    Diagnostics: Labs, CXR, EKG  Treatments: none immediately   Teodoro Spray, [place] 02/08/22 1431

## 2022-02-12 ENCOUNTER — Telehealth: Payer: Self-pay

## 2022-02-12 NOTE — Telephone Encounter (Signed)
   Reason for call: ED-Follow up call  Patient visited [facility] on 02/08/2022 for Palpitations  Have you been able to follow up with your primary care physician? - N/A Spoke to PCP waiting on appt for cardiologist   The patient was or was not able to obtain any needed medicine or equipment. - Was, Yes  Are there diet recommendations that you are having difficulty following? - No  Patient expresses understanding of discharge instructions and education provided has no other needs at this time.    [place] management  [place], [place]  Main Phone: [phone]  E-mail: [email or web address]  Website: [email or web address]

## 2022-02-14 ENCOUNTER — Telehealth: Payer: Self-pay | Admitting: Surgery

## 2022-02-14 ENCOUNTER — Ambulatory Visit: Payer: Medicaid Other | Admitting: Physician Assistant

## 2022-02-14 DIAGNOSIS — E782 Mixed hyperlipidemia: Secondary | ICD-10-CM | POA: Diagnosis not present

## 2022-02-14 DIAGNOSIS — N182 Chronic kidney disease, stage 2 (mild): Secondary | ICD-10-CM | POA: Diagnosis not present

## 2022-02-14 DIAGNOSIS — I471 Supraventricular tachycardia, unspecified: Secondary | ICD-10-CM | POA: Diagnosis not present

## 2022-02-14 DIAGNOSIS — I2510 Atherosclerotic heart disease of native coronary artery without angina pectoris: Secondary | ICD-10-CM | POA: Diagnosis not present

## 2022-02-14 DIAGNOSIS — I10 Essential (primary) hypertension: Secondary | ICD-10-CM | POA: Diagnosis not present

## 2022-02-14 DIAGNOSIS — E785 Hyperlipidemia, unspecified: Secondary | ICD-10-CM | POA: Diagnosis not present

## 2022-02-14 NOTE — Telephone Encounter (Signed)
Daughter [name] calls, her mom had paraesophageal hernia repair done on 02/05/22 with Dr. Dahlia Byes.  One of her blood pressure medications is a capsule and she is afraid of trying to take this as it may get caught in her throat. She wants to know if ok to start taking this?  Please call. Thank  you.

## 2022-02-14 NOTE — Telephone Encounter (Signed)
Spoke with the patient and let her know she may take her capsule medication. If she is worried about it she may take it with applesauce or pudding.

## 2022-02-20 ENCOUNTER — Other Ambulatory Visit: Payer: Self-pay

## 2022-02-20 ENCOUNTER — Ambulatory Visit (INDEPENDENT_AMBULATORY_CARE_PROVIDER_SITE_OTHER): Payer: Medicare Other | Admitting: Surgery

## 2022-02-20 ENCOUNTER — Encounter: Payer: Self-pay | Admitting: Surgery

## 2022-02-20 VITALS — BP 166/85 | HR 65 | Temp 98.5°F | Ht 65.5 in | Wt 166.0 lb

## 2022-02-20 DIAGNOSIS — R911 Solitary pulmonary nodule: Secondary | ICD-10-CM | POA: Insufficient documentation

## 2022-02-20 DIAGNOSIS — Z09 Encounter for follow-up examination after completed treatment for conditions other than malignant neoplasm: Secondary | ICD-10-CM

## 2022-02-20 DIAGNOSIS — K449 Diaphragmatic hernia without obstruction or gangrene: Secondary | ICD-10-CM

## 2022-02-20 NOTE — Patient Instructions (Addendum)
You may start with some shredded chicken or beef. No carbonated beverages. Small portions.   We will contact you in December 2023 to schedule a follow up appointment for January 2024.

## 2022-02-21 NOTE — Progress Notes (Signed)
Amanda Duke is 2 weeks out from robotic paraesophageal hernia repair.  Doing very well.  She is taking diet no nausea no vomiting no dysphagia.  She is walking and she is back to baseline.  Physical exam: No acute distress awake and alert. Abdomen: Soft nontender incisions healing well without evidence of infection.  No peritonitis  A/P doing very well after paraesophageal hernia repair.  Continue Nissen diet.  No evidence of complications.  Return to clinic in 3 months or so

## 2022-02-22 ENCOUNTER — Ambulatory Visit: Payer: Medicaid Other | Admitting: Physician Assistant

## 2022-03-12 DIAGNOSIS — K5904 Chronic idiopathic constipation: Secondary | ICD-10-CM | POA: Diagnosis not present

## 2022-03-12 DIAGNOSIS — K6389 Other specified diseases of intestine: Secondary | ICD-10-CM | POA: Diagnosis not present

## 2022-03-12 DIAGNOSIS — M1612 Unilateral primary osteoarthritis, left hip: Secondary | ICD-10-CM | POA: Diagnosis not present

## 2022-03-12 DIAGNOSIS — K219 Gastro-esophageal reflux disease without esophagitis: Secondary | ICD-10-CM | POA: Diagnosis not present

## 2022-03-12 DIAGNOSIS — K5939 Other megacolon: Secondary | ICD-10-CM | POA: Diagnosis not present

## 2022-03-28 ENCOUNTER — Ambulatory Visit: Payer: Medicaid Other | Admitting: Physician Assistant

## 2022-03-28 ENCOUNTER — Encounter: Payer: Self-pay | Admitting: Physician Assistant

## 2022-03-28 VITALS — BP 155/69 | HR 60 | Temp 97.9°F | Resp 16 | Ht 65.5 in | Wt 164.8 lb

## 2022-03-28 DIAGNOSIS — R300 Dysuria: Secondary | ICD-10-CM | POA: Diagnosis not present

## 2022-03-28 DIAGNOSIS — I10 Essential (primary) hypertension: Secondary | ICD-10-CM

## 2022-03-28 DIAGNOSIS — Z0001 Encounter for general adult medical examination with abnormal findings: Secondary | ICD-10-CM | POA: Diagnosis not present

## 2022-03-28 DIAGNOSIS — F411 Generalized anxiety disorder: Secondary | ICD-10-CM

## 2022-03-28 MED ORDER — SHINGRIX 50 MCG/0.5ML IM SUSR: 0.5000 mL | Freq: Once | INTRAMUSCULAR | 0 refills | Status: AC

## 2022-03-28 MED ORDER — CLONAZEPAM 0.5 MG PO TABS: ORAL_TABLET | ORAL | 1 refills | Status: DC

## 2022-03-28 MED ORDER — TETANUS-DIPHTH-ACELL PERTUSSIS 5-2.5-18.5 LF-MCG/0.5 IM SUSY: 0.5000 mL | PREFILLED_SYRINGE | Freq: Once | INTRAMUSCULAR | 0 refills | Status: AC

## 2022-03-28 MED ORDER — DILTIAZEM HCL ER COATED BEADS 180 MG PO CP24: 180.0000 mg | ORAL_CAPSULE | Freq: Every day | ORAL | 2 refills | Status: DC

## 2022-03-28 NOTE — Progress Notes (Signed)
[facility] [place], [place] [address]  Internal MEDICINE  Office Visit Note  Patient Name: Amanda Duke  [id number]  [id number]  Date of Service: 04/07/2022  Chief Complaint  Patient presents with   Medicare Wellness   Hyperlipidemia   Hypertension   Quality Metric Gaps    Shingles and TDAP     HPI Pt is here for routine health maintenance examination -Recovering well since hernia repair. Did have constipation after and has been seeing GI for this -tdap and shingles due and will go to pharmacy for these -Bp at home has been 140s-150systolic on all her meds at home as well. States BP was elevated at last cardiology visit as well, but she hadnt taken one of her meds at the time so nothing was changed. Since she is taking all of her meds today and is elevated in office and at home will adjust diltiazem to '180mg'$  and have her monitor closely.  -reports anxiety had been improved and wasn't taking klonopin very often, but since surgery has been more anxious and does request refill today. She takes 1/2 tab as needed -due for shingles and tdap  Current Medication: Outpatient Encounter Medications as of 03/28/2022  Medication Sig   acetaminophen (TYLENOL) 500 MG tablet Take 1,000 mg by mouth every 6 (six) hours as needed for moderate pain.   aspirin EC 81 MG tablet Take 81 mg by mouth daily. Swallow whole.   cholecalciferol (VITAMIN D) 1000 units tablet Take 1,000 Units by mouth daily.   diltiazem (CARDIZEM CD) 180 MG 24 hr capsule Take 1 capsule (180 mg total) by mouth daily.   Fexofenadine HCl (MUCINEX ALLERGY PO) Take 1 tablet by mouth daily at 6 (six) AM.   fluticasone (FLONASE) 50 MCG/ACT nasal spray Place 1 spray into both nostrils daily.   ibuprofen (ADVIL) 600 MG tablet Take 1 tablet (600 mg total) by mouth 2 (two) times daily as needed. (Patient taking differently: Take 600 mg by mouth 2 (two) times daily as needed for moderate pain.)   icosapent  Ethyl (VASCEPA) 1 g capsule Take 2 g by mouth 2 (two) times daily.   linaclotide (LINZESS) 290 MCG CAPS capsule Take 1 capsule (290 mcg total) by mouth daily. (Patient taking differently: Take 290 mcg by mouth daily before breakfast.)   losartan (COZAAR) 100 MG tablet Take 100 mg by mouth every morning.   oxyCODONE (OXY IR/ROXICODONE) 5 MG immediate release tablet Take 1 tablet (5 mg total) by mouth every 6 (six) hours as needed for severe pain or breakthrough pain.   pantoprazole (PROTONIX) 40 MG tablet Take 40 mg by mouth in the morning.   rosuvastatin (CRESTOR) 40 MG tablet Take 40 mg by mouth at bedtime.   solifenacin (VESICARE) 5 MG tablet TAKE 1 TABLET (5 MG TOTAL) BY MOUTH DAILY. (Patient taking differently: Take 5 mg by mouth as needed.)   [DISCONTINUED] clonazePAM (KLONOPIN) 0.5 MG tablet Take 1 tablet by mouth as needed (Patient taking differently: Take 0.5 mg by mouth as needed. Take 1 tablet by mouth as needed)   [DISCONTINUED] Tdap (BOOSTRIX) 5-2.5-18.5 LF-MCG/0.5 injection Inject 0.5 mLs into the muscle once.   [DISCONTINUED] Zoster Vaccine Adjuvanted [facility]) injection Inject 0.5 mLs into the muscle once.   carvedilol (COREG) 3.125 MG tablet Take 3.125 mg by mouth 2 (two) times daily with a meal.    clonazePAM (KLONOPIN) 0.5 MG tablet Take 1 tablet by mouth as needed   [EXPIRED] Tdap (BOOSTRIX) 5-2.5-18.5 LF-MCG/0.5  injection Inject 0.5 mLs into the muscle once for 1 dose.   [EXPIRED] Zoster Vaccine Adjuvanted [facility]) injection Inject 0.5 mLs into the muscle once for 1 dose.   [DISCONTINUED] diltiazem (CARDIZEM CD) 120 MG 24 hr capsule Take 120 mg by mouth every morning.   No facility-administered encounter medications on file as of 03/28/2022.    Surgical History: Past Surgical History:  Procedure Laterality Date   [place]   COLONOSCOPY WITH PROPOFOL N/A 12/12/2016   Procedure: COLONOSCOPY WITH PROPOFOL;  Surgeon:  Lollie Sails, MD;  Location: [facility] ENDOSCOPY;  Service: Endoscopy;  Laterality: N/A;   COLONOSCOPY WITH PROPOFOL N/A 07/14/2019   Procedure: COLONOSCOPY WITH PROPOFOL;  Surgeon: Toledo, Benay Pike, MD;  Location: [facility] ENDOSCOPY;  Service: Gastroenterology;  Laterality: N/A;   ESOPHAGOGASTRODUODENOSCOPY N/A 12/27/2021   Procedure: ESOPHAGOGASTRODUODENOSCOPY (EGD);  Surgeon: Benjamine Sprague, DO;  Location: [facility] ENDOSCOPY;  Service: General;  Laterality: N/A;   ESOPHAGOGASTRODUODENOSCOPY (EGD) WITH PROPOFOL N/A 12/12/2016   Procedure: ESOPHAGOGASTRODUODENOSCOPY (EGD) WITH PROPOFOL;  Surgeon: Lollie Sails, MD;  Location: [facility] ENDOSCOPY;  Service: Endoscopy;  Laterality: N/A;   ESOPHAGOGASTRODUODENOSCOPY (EGD) WITH PROPOFOL N/A 07/14/2019   Procedure: ESOPHAGOGASTRODUODENOSCOPY (EGD) WITH PROPOFOL;  Surgeon: Toledo, Benay Pike, MD;  Location: [facility] ENDOSCOPY;  Service: Gastroenterology;  Laterality: N/A;   INSERTION OF MESH  02/05/2022   Procedure: INSERTION OF MESH;  Surgeon: Jules Husbands, MD;  Location: [facility] ORS;  Service: General;;   [place]   right   LEFT HEART CATH AND CORONARY ANGIOGRAPHY Left 09/25/2001   Procedure: LEFT HEART CATH AND CORONARY ANGIOGRAPHY; Location: [place]; Surgeon: Katrine Coho, MD   LEFT HEART CATH AND CORONARY ANGIOGRAPHY Left 06/09/2003   Procedure: LEFT HEART CATH AND CORONARY ANGIOGRAPHY; Location: [place]; Surgeon: Katrine Coho, MD   ORIF ANKLE FRACTURE Right    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    TONSILLECTOMY  1948   TUBAL LIGATION  6213   UMBILICAL HERNIA REPAIR N/A 02/05/2022   Procedure: HERNIA REPAIR UMBILICAL ADULT;  Surgeon: Jules Husbands, MD;  Location: [facility] ORS;  Service: General;  Laterality: N/A;   VAGINAL HYSTERECTOMY  1985   d/t heavy bleeding   XI ROBOTIC ASSISTED PARAESOPHAGEAL HERNIA REPAIR N/A 02/05/2022   Procedure: XI ROBOTIC ASSISTED PARAESOPHAGEAL HERNIA REPAIR, RNFA to  assist;  Surgeon: Jules Husbands, MD;  Location: [facility] ORS;  Service: General;  Laterality: N/A;    Medical History: Past Medical History:  Diagnosis Date   Anxiety    Aortic atherosclerosis ([place])    Arthritis    Bilateral renal cysts    Coronary artery disease 09/25/2001   a.) LHC 09/25/2001: EF 78%. 20% pLAD - med mgmt; b.) LHC 06/09/2003: EF 63%. 20% pLCx, 20% pLAD, 50% mLAD, 50% dLAD - med mgmt.   Diastolic dysfunction 08/65/7846   a.) Stress echo 12/30/2017: ED >55%, triv PR, mild AR.MR, mod TR, G1DD; b.) TTE 03/30/2020: EF >55%, mild LVH, mild panvalvular regurgitation.   Gastritis    GERD (gastroesophageal reflux disease)    Hepatic steatosis    History of 2019 novel coronavirus disease (COVID-19) 01/11/2022   History of hiatal hernia    Hyperlipemia    Hyperplastic colon polyp    Hypertension    Pneumonia    PONV (postoperative nausea and vomiting)    a.) PONV following orthopedic surgery (hip) in 2022   Prediabetes    Redundant colon  Tubular adenoma of colon    Umbilical hernia     Family History: Family History  Problem Relation Age of Onset   Heart failure Mother    Stroke Mother    Breast cancer Mother        dx at age 83 yo   Diabetes Mother    Osteoporosis Mother    Emphysema Father    Ovarian cancer Sister        dx at age 76 yo   Asthma Sister    Diabetes Daughter    Heart disease Daughter    COPD Daughter       Review of Systems  Constitutional:  Negative for chills, fatigue and unexpected weight change.  HENT:  Negative for congestion, rhinorrhea, sneezing and sore throat.   Eyes:  Negative for redness.  Respiratory:  Negative for cough, chest tightness and shortness of breath.   Cardiovascular:  Negative for chest pain and palpitations.  Gastrointestinal:  Positive for constipation. Negative for abdominal pain, diarrhea, nausea and vomiting.  Genitourinary:  Negative for dysuria, frequency, hematuria and pelvic pain.       Incontinence   Musculoskeletal:  Positive for arthralgias. Negative for back pain, joint swelling and neck pain.  Skin:  Negative for rash.  Neurological: Negative.  Negative for tremors and numbness.  Hematological:  Negative for adenopathy. Does not bruise/bleed easily.  Psychiatric/Behavioral:  Negative for behavioral problems (Depression), sleep disturbance and suicidal ideas. The patient is not nervous/anxious.      Vital Signs: BP (!) 155/69   Pulse 60   Temp 97.9 F (36.6 C)   Resp 16   Ht 5' 5.5" (1.664 m)   Wt 164 lb 12.8 oz (74.8 kg)   SpO2 98%   BMI 27.01 kg/m    Physical Exam Vitals and nursing note reviewed.  Constitutional:      General: She is not in acute distress.    Appearance: Normal appearance. She is well-developed. She is not diaphoretic.  HENT:     Head: Normocephalic and atraumatic.     Mouth/Throat:     Pharynx: No oropharyngeal exudate.  Eyes:     Pupils: Pupils are equal, round, and reactive to light.  Neck:     Thyroid: No thyromegaly.     Vascular: No JVD.     Trachea: No tracheal deviation.  Cardiovascular:     Rate and Rhythm: Normal rate and regular rhythm.     Heart sounds: Normal heart sounds. No murmur heard.    No friction rub. No gallop.  Pulmonary:     Effort: Pulmonary effort is normal. No respiratory distress.     Breath sounds: No wheezing or rales.  Chest:     Chest wall: No tenderness.  Abdominal:     General: Bowel sounds are normal.     Palpations: Abdomen is soft.  Musculoskeletal:        General: Normal range of motion.     Cervical back: Normal range of motion and neck supple.  Lymphadenopathy:     Cervical: No cervical adenopathy.  Skin:    General: Skin is warm and dry.  Neurological:     Mental Status: She is alert and oriented to person, place, and time.     Cranial Nerves: No cranial nerve deficit.  Psychiatric:        Behavior: Behavior normal.        Thought Content: Thought content normal.        Judgment:  Judgment  normal.      LABS: Recent Results (from the past 2160 hour(s))  SARS CORONAVIRUS 2 (TAT 6-24 HRS) Anterior Nasal Swab     Status: Abnormal   Collection Time: 01/11/22 12:52 PM   Specimen: Anterior Nasal Swab  Result Value Ref Range   SARS Coronavirus 2 POSITIVE (A) NEGATIVE    Comment: (NOTE) SARS-CoV-2 target nucleic acids are DETECTED.  The SARS-CoV-2 RNA is generally detectable in upper and lower respiratory specimens during the acute phase of infection. Positive results are indicative of the presence of SARS-CoV-2 RNA. Clinical correlation with patient history and other diagnostic information is  necessary to determine patient infection status. Positive results do not rule out bacterial infection or co-infection with other viruses.  The expected result is Negative.  Fact Sheet for Patients: SugarRoll.be  Fact Sheet for Healthcare Providers: https://www.woods-mathews.com/  This test is not yet approved or cleared by the [place] FDA and  has been authorized for detection and/or diagnosis of SARS-CoV-2 by FDA under an Emergency Use Authorization (EUA). This EUA will remain  in effect (meaning this test can be used) for the duration of the COVID-19 declaration under Section 564(b)(1) of the Act, 21 U. S.C. section 360bbb-3(b)(1), unless the authorization is terminated or revoked sooner.   Performed at [facility], [place] [address] [place], [place] [address]   Surgical pathology     Status: None   Collection Time: 02/05/22 10:03 AM  Result Value Ref Range   SURGICAL PATHOLOGY      SURGICAL PATHOLOGY CASE: [id number] PATIENT: Show Low Helbing Surgical Pathology Report     Specimen Submitted: A. Hernia sac  Clinical History: Hiatal hernia    DIAGNOSIS: A. SOFT TISSUE, PARAESOPHAGEAL; EXCISION: - BENIGN FIBROADIPOSE AND FIBROMEMBRANOUS TISSUE, COMPATIBLE WITH HERNIA SAC. - TWO BENIGN LYMPH NODES  WITH SINUS HISTIOCYTOSIS AND ANTHRACOTIC PIGMENT. - NEGATIVE FOR MALIGNANCY.  GROSS DESCRIPTION: A. Labeled: Hernia sac Received: Fresh Collection time: 10:03 AM on 02/05/2022 Placed into formalin time: 10:35 AM on 02/05/2022 Tissue fragment(s): 2 Size: 21.5 x 3.5 x 1.6 cm and 4.4 x 2.4 x 1.2 cm Description: Received are 2 fragments of yellow lobulated adipose tissue and pink-red membranous tissue.  The fragments are serially sectioned and no distinct masses or lesions are grossly identified.  There are 2 embedded lymph node candidates, ranging from 1.1 to 1.3 cm in greatest dimension. The specimen is submitted as follows: 1 - representa tive sections of soft tissue and membranous tissue 2 - 1 lymph node candidate, serially sectioned and submitted entirely 3 - 1 lymph node candidate, serially sectioned and submitted entirely  RB 02/05/2022   Final Diagnosis performed by Allena Napoleon, MD.   Electronically signed 02/06/2022 1:09:38PM The electronic signature indicates that the named Attending Pathologist has evaluated the specimen Technical component performed at [facility], [address], [place], [place] [address] Lab: [phone] Dir: Rush Farmer, MD, MMM  Professional component performed at [facility], [facility], [place], [place], [place] [address] Lab: [phone] Dir: Kathi Simpers, MD   CBC     Status: Abnormal   Collection Time: 02/05/22  4:52 PM  Result Value Ref Range   WBC 10.1 4.0 - 10.5 K/uL   RBC 4.37 3.87 - 5.11 MIL/uL   Hemoglobin 11.2 (L) 12.0 - 15.0 g/dL   HCT 35.5 (L) 36.0 - 46.0 %   MCV 81.2 80.0 - 100.0 fL   MCH 25.6 (L) 26.0 - 34.0 pg   MCHC 31.5 30.0 - 36.0  g/dL   RDW 16.7 (H) 11.5 - 15.5 %   Platelets 237 150 - 400 K/uL   nRBC 0.0 0.0 - 0.2 %    Comment: Performed at [facility], [address] [place], [place] [address]  Creatinine, serum     Status: Abnormal   Collection Time: 02/05/22  4:52 PM  Result  Value Ref Range   Creatinine, Ser 1.06 (H) 0.44 - 1.00 mg/dL   GFR, Estimated 53 (L) >60 mL/min    Comment: (NOTE) Calculated using the CKD-EPI Creatinine Equation (2021) Performed at [facility], [address] [place], [place] [address]   Basic metabolic panel     Status: Abnormal   Collection Time: 02/06/22  5:01 AM  Result Value Ref Range   Sodium 139 135 - 145 mmol/L   Potassium 4.0 3.5 - 5.1 mmol/L   Chloride 108 98 - 111 mmol/L   CO2 23 22 - 32 mmol/L   Glucose, Bld 137 (H) 70 - 99 mg/dL    Comment: Glucose reference range applies only to samples taken after fasting for at least 8 hours.   BUN 13 8 - 23 mg/dL   Creatinine, Ser 0.99 0.44 - 1.00 mg/dL   Calcium 8.9 8.9 - 10.3 mg/dL   GFR, Estimated 57 (L) >60 mL/min    Comment: (NOTE) Calculated using the CKD-EPI Creatinine Equation (2021)    Anion gap 8 5 - 15    Comment: Performed at [facility], [address] [place], [place] [address]  CBC     Status: Abnormal   Collection Time: 02/06/22  5:01 AM  Result Value Ref Range   WBC 10.6 (H) 4.0 - 10.5 K/uL   RBC 4.13 3.87 - 5.11 MIL/uL   Hemoglobin 10.6 (L) 12.0 - 15.0 g/dL   HCT 33.9 (L) 36.0 - 46.0 %   MCV 82.1 80.0 - 100.0 fL   MCH 25.7 (L) 26.0 - 34.0 pg   MCHC 31.3 30.0 - 36.0 g/dL   RDW 16.3 (H) 11.5 - 15.5 %   Platelets 226 150 - 400 K/uL   nRBC 0.0 0.0 - 0.2 %    Comment: Performed at [facility], [address] [place], [place] [address]  CBC with Differential     Status: Abnormal   Collection Time: 02/08/22  2:30 PM  Result Value Ref Range   WBC 8.1 4.0 - 10.5 K/uL   RBC 5.07 3.87 - 5.11 MIL/uL   Hemoglobin 13.0 12.0 - 15.0 g/dL   HCT 41.8 36.0 - 46.0 %   MCV 82.4 80.0 - 100.0 fL   MCH 25.6 (L) 26.0 - 34.0 pg   MCHC 31.1 30.0 - 36.0 g/dL   RDW 17.0 (H) 11.5 - 15.5 %   Platelets 271 150 - 400 K/uL   nRBC 0.0 0.0 - 0.2 %   Neutrophils Relative % 67 %   Neutro Abs 5.5 1.7 - 7.7 K/uL   Lymphocytes Relative 19 %    Lymphs Abs 1.5 0.7 - 4.0 K/uL   Monocytes Relative 11 %   Monocytes Absolute 0.9 0.1 - 1.0 K/uL   Eosinophils Relative 1 %   Eosinophils Absolute 0.1 0.0 - 0.5 K/uL   Basophils Relative 1 %   Basophils Absolute 0.1 0.0 - 0.1 K/uL   Immature Granulocytes 1 %   Abs Immature Granulocytes 0.04 0.00 - 0.07 K/uL    Comment: Performed at [facility], [address] [place], [place] [address]  Basic  metabolic panel     Status: Abnormal   Collection Time: 02/08/22  2:30 PM  Result Value Ref Range   Sodium 139 135 - 145 mmol/L   Potassium 3.7 3.5 - 5.1 mmol/L   Chloride 104 98 - 111 mmol/L   CO2 24 22 - 32 mmol/L   Glucose, Bld 111 (H) 70 - 99 mg/dL    Comment: Glucose reference range applies only to samples taken after fasting for at least 8 hours.   BUN 21 8 - 23 mg/dL   Creatinine, Ser 1.05 (H) 0.44 - 1.00 mg/dL   Calcium 9.1 8.9 - 10.3 mg/dL   GFR, Estimated 53 (L) >60 mL/min    Comment: (NOTE) Calculated using the CKD-EPI Creatinine Equation (2021)    Anion gap 11 5 - 15    Comment: Performed at [facility], [place], [place] [address]  Troponin I (High Sensitivity)     Status: None   Collection Time: 02/08/22  2:30 PM  Result Value Ref Range   Troponin I (High Sensitivity) 7 <18 ng/L    Comment: (NOTE) Elevated high sensitivity troponin I (hsTnI) values and significant  changes across serial measurements may suggest ACS but many other  chronic and acute conditions are known to elevate hsTnI results.  Refer to the "Links" section for chest pain algorithms and additional  guidance. Performed at [facility], [place], [place] [address]   Troponin I (High Sensitivity)     Status: None   Collection Time: 02/08/22  4:30 PM  Result Value Ref Range   Troponin I (High Sensitivity) 9 <18 ng/L    Comment: (NOTE) Elevated high sensitivity troponin I (hsTnI) values and significant  changes across serial measurements may  suggest ACS but many other  chronic and acute conditions are known to elevate hsTnI results.  Refer to the "Links" section for chest pain algorithms and additional  guidance. Performed at [facility], [address] [place], [place] [address]   Urinalysis, Routine w reflex microscopic     Status: Abnormal   Collection Time: 02/08/22  7:00 PM  Result Value Ref Range   Color, Urine STRAW (A) YELLOW   APPearance CLEAR (A) CLEAR   Specific Gravity, Urine 1.011 1.005 - 1.030   pH 5.0 5.0 - 8.0   Glucose, UA NEGATIVE NEGATIVE mg/dL   Hgb urine dipstick NEGATIVE NEGATIVE   Bilirubin Urine NEGATIVE NEGATIVE   Ketones, ur 20 (A) NEGATIVE mg/dL   Protein, ur NEGATIVE NEGATIVE mg/dL   Nitrite NEGATIVE NEGATIVE   Leukocytes,Ua NEGATIVE NEGATIVE    Comment: Performed at [facility], [address] [place], [place] [address]  UA/M w/rflx Culture, Routine     Status: Abnormal   Collection Time: 03/28/22  4:15 PM   Specimen: Urine   Urine  Result Value Ref Range   Specific Gravity, UA 1.015 1.005 - 1.030   pH, UA 5.5 5.0 - 7.5   Color, UA Yellow Yellow   Appearance Ur Cloudy (A) Clear   Leukocytes,UA Negative Negative   Protein,UA Negative Negative/Trace   Glucose, UA Negative Negative   Ketones, UA Negative Negative   RBC, UA Negative Negative   Bilirubin, UA Negative Negative   Urobilinogen, Ur 1.0 0.2 - 1.0 mg/dL   Nitrite, UA Negative Negative   Microscopic Examination Comment     Comment: Microscopic follows if indicated.   Microscopic Examination See below:     Comment: Microscopic was indicated and  was performed.   Urinalysis Reflex Comment     Comment: This specimen has reflexed to a Urine Culture.  Microscopic Examination     Status: Abnormal   Collection Time: 03/28/22  4:15 PM   Urine  Result Value Ref Range   WBC, UA 6-10 (A) 0 - 5 /hpf   RBC, Urine None seen 0 - 2 /hpf   Epithelial Cells (non renal) 0-10 0 - 10 /hpf   Casts None seen None seen  /lpf   Bacteria, UA Many (A) None seen/Few  Urine Culture, Reflex     Status: Abnormal   Collection Time: 03/28/22  4:15 PM   Urine  Result Value Ref Range   Urine Culture, Routine Final report (A)    Organism ID, Bacteria Aerococcus urinae (A)     Comment: 50,000-100,000 colony forming units per mL Aerococcus urinae has been described as susceptible to penicillin, amoxicillin, piperacillin, cefepime, rifampin, and nitrofurantoin, and resistant to sulfonamides.         Assessment/Plan: 1. Encounter for general adult medical examination with abnormal findings CPE performed  2. Benign essential HTN Elevated in office and at home despite taking all meds as prescribed. Will increase diltiazem and continue to monitor - diltiazem (CARDIZEM CD) 180 MG 24 hr capsule; Take 1 capsule (180 mg total) by mouth daily.  Dispense: 30 capsule; Refill: 2  3. Generalized anxiety disorder May continue klonopin as needed - clonazePAM (KLONOPIN) 0.5 MG tablet; Take 1 tablet by mouth as needed  Dispense: 30 tablet; Refill: 1  4. Dysuria - UA/M w/rflx Culture, Routine   General Counseling: Amanda Duke verbalizes understanding of the findings of todays visit and agrees with plan of treatment. I have discussed any further diagnostic evaluation that may be needed or ordered today. We also reviewed her medications today. she has been encouraged to call the office with any questions or concerns that should arise related to todays visit.    Counseling:    Orders Placed This Encounter  Procedures   Microscopic Examination   Urine Culture, Reflex   UA/M w/rflx Culture, Routine    Meds ordered this encounter  Medications   clonazePAM (KLONOPIN) 0.5 MG tablet    Sig: Take 1 tablet by mouth as needed    Dispense:  30 tablet    Refill:  1    Not to exceed 4 additional fills before 04/04/2021   diltiazem (CARDIZEM CD) 180 MG 24 hr capsule    Sig: Take 1 capsule (180 mg total) by mouth daily.     Dispense:  30 capsule    Refill:  2   Zoster Vaccine Adjuvanted [facility]) injection    Sig: Inject 0.5 mLs into the muscle once for 1 dose.    Dispense:  0.5 mL    Refill:  0   Tdap (BOOSTRIX) 5-2.5-18.5 LF-MCG/0.5 injection    Sig: Inject 0.5 mLs into the muscle once for 1 dose.    Dispense:  0.5 mL    Refill:  0    This patient was seen by Drema Dallas, PA-C in collaboration with Dr. Clayborn Bigness as a part of collaborative care agreement.  Total time spent:35 Minutes  Time spent includes review of chart, medications, test results, and follow up plan with the patient.     Lavera Guise, MD  Internal Medicine

## 2022-04-02 ENCOUNTER — Other Ambulatory Visit: Payer: Self-pay | Admitting: Physician Assistant

## 2022-04-02 LAB — MICROSCOPIC EXAMINATION
Casts: NONE SEEN /lpf
RBC, Urine: NONE SEEN /hpf (ref 0–2)

## 2022-04-02 LAB — UA/M W/RFLX CULTURE, ROUTINE
Bilirubin, UA: NEGATIVE
Glucose, UA: NEGATIVE
Ketones, UA: NEGATIVE
Leukocytes,UA: NEGATIVE
Nitrite, UA: NEGATIVE
Protein,UA: NEGATIVE
RBC, UA: NEGATIVE
Specific Gravity, UA: 1.015 (ref 1.005–1.030)
Urobilinogen, Ur: 1 mg/dL (ref 0.2–1.0)
pH, UA: 5.5 (ref 5.0–7.5)

## 2022-04-02 LAB — URINE CULTURE, REFLEX

## 2022-04-04 ENCOUNTER — Other Ambulatory Visit: Payer: Self-pay | Admitting: Physician Assistant

## 2022-04-04 ENCOUNTER — Telehealth: Payer: Self-pay

## 2022-04-04 DIAGNOSIS — N390 Urinary tract infection, site not specified: Secondary | ICD-10-CM

## 2022-04-04 MED ORDER — CEPHALEXIN 500 MG PO CAPS: 500.0000 mg | ORAL_CAPSULE | Freq: Two times a day (BID) | ORAL | 0 refills | Status: DC

## 2022-04-04 NOTE — Telephone Encounter (Signed)
-----   Message from Mylinda Latina, PA-C sent at 04/02/2022  4:54 PM EST ----- Please let her know that she does have a UTI, but her sensitivity shows limited ABX options due to her allergies. We can try treating with keflex or cipro though either may not completely treat. I believe she has taken both before without a problem but please confirm that she can take one of these and I will send it.

## 2022-04-04 NOTE — Telephone Encounter (Signed)
Spoke with patient regarding urine results. Patient confirmed that she is okay with taking Keflex.

## 2022-05-01 ENCOUNTER — Other Ambulatory Visit: Payer: Self-pay

## 2022-05-01 ENCOUNTER — Emergency Department
Admission: EM | Admit: 2022-05-01 | Discharge: 2022-05-01 | Disposition: A | Discharge status: Home / Self Care | Payer: Medicare Other | Attending: Emergency Medicine | Admitting: Emergency Medicine

## 2022-05-01 DIAGNOSIS — K0889 Other specified disorders of teeth and supporting structures: Secondary | ICD-10-CM | POA: Insufficient documentation

## 2022-05-01 NOTE — Discharge Instructions (Signed)
Please follow up with your dentist.

## 2022-05-01 NOTE — ED Triage Notes (Signed)
Pt to ED via POV. Pt states that she went to the dentist yesterday and was given antibiotics. Pt states that the facial swelling is worse today.

## 2022-05-01 NOTE — ED Provider Notes (Signed)
   [facility] Provider Note    Event Date/Time   First MD Initiated Contact with Patient 05/01/22 1239     (approximate)   History   Dental Pain   HPI  Amanda Duke is a 82 y.o. female  who presents to the emergency department today with concern for continued dental pain and swelling.  Located in her right upper jaw.  The patient states that she saw her dentist yesterday and was prescribed antibiotics.  When she woke up this morning she felt the swelling was worse however by the time my exam she does feel like it has started to improve.  Additionally the patient has felt fatigued.  She denies any fevers.     Physical Exam   Triage Vital Signs: ED Triage Vitals  Enc Vitals Group     BP 05/01/22 1139 134/72     Pulse Rate 05/01/22 1139 78     Resp 05/01/22 1139 16     Temp 05/01/22 1139 98.7 F (37.1 C)     Temp Source 05/01/22 1139 Oral     SpO2 05/01/22 1139 98 %     Weight --      Height --      Head Circumference --      Peak Flow --      Pain Score 05/01/22 1137 6     Pain Loc --      Pain Edu? --      Excl. in [place]? --     Most recent vital signs: Vitals:   05/01/22 1139  BP: 134/72  Pulse: 78  Resp: 16  Temp: 98.7 F (37.1 C)  SpO2: 98%   General: Awake, alert, oriented. CV:  Good peripheral perfusion. Regular rate and rhythm. Resp:  Normal effort. Lungs clear. Abd:  No distention.  Dental:  Poor overall dentition. Some swelling to right cheek. Tender to palpation of right upper molar.   ED Results / Procedures / Treatments   Labs (all labs ordered are listed, but only abnormal results are displayed) Labs Reviewed - No data to display   EKG  None   RADIOLOGY None    PROCEDURES:  Critical Care performed: No  Procedures   MEDICATIONS ORDERED IN ED: Medications - No data to display   IMPRESSION / MDM / [place] / ED COURSE  I reviewed the triage vital signs and the nursing notes.                               Differential diagnosis includes, but is not limited to, dental infection, dental abscess.  Patient's presentation is most consistent with acute presentation with potential threat to life or bodily function.  Patient presented to the emergency department today because of concerns for continued dental pain and swelling.  On exam patient does have some swelling to the right cheek.  She has poor dentition.  At this time I do think patient likely suffering from dental infection.  I discussed this with the patient.  Did discuss importance of continued follow-up with her dentist.  No fever, tachycardia or hypotension here to suggest systemic infection.     FINAL CLINICAL IMPRESSION(S) / ED DIAGNOSES   Final diagnoses:  Pain, dental      Note:  This document was prepared using Dragon voice recognition software and may include unintentional dictation errors.    Nance Pear, MD 05/01/22 [phone]

## 2022-05-03 ENCOUNTER — Ambulatory Visit (INDEPENDENT_AMBULATORY_CARE_PROVIDER_SITE_OTHER): Payer: 59 | Admitting: Physician Assistant

## 2022-05-03 ENCOUNTER — Encounter: Payer: Self-pay | Admitting: Physician Assistant

## 2022-05-03 VITALS — BP 132/80 | HR 68 | Temp 98.3°F | Resp 16 | Ht 65.5 in | Wt 167.0 lb

## 2022-05-03 DIAGNOSIS — I10 Essential (primary) hypertension: Secondary | ICD-10-CM | POA: Diagnosis not present

## 2022-05-03 DIAGNOSIS — F411 Generalized anxiety disorder: Secondary | ICD-10-CM

## 2022-05-03 DIAGNOSIS — K0889 Other specified disorders of teeth and supporting structures: Secondary | ICD-10-CM | POA: Diagnosis not present

## 2022-05-03 NOTE — Progress Notes (Signed)
[facility] [place], [place] [address]  Internal MEDICINE  Office Visit Note  Patient Name: Amanda Duke  [id number]  [id number]  Date of Service: 05/14/2022  Chief Complaint  Patient presents with   Follow-up   Gastroesophageal Reflux   Hypertension   Hyperlipidemia    HPI Pt is here for routine follow up -Last tuesday went to the dentist, was put on a zpak. Had gone to ED with dental pain and swelling on 05/01/22 and was told to continue ABX and follow up with dentist -Right side of face was swollen and is now improving on ABX. Plans to have upper 6 remaining teeth  pulled, so she can move forward with dentures. May look at other oral surgeons though that take medicaid secondary. -BP has been doing really good, did spike at the dentist on wrist monitor, but otherwise has been really good.  Current Medication: Outpatient Encounter Medications as of 05/03/2022  Medication Sig   acetaminophen (TYLENOL) 500 MG tablet Take 1,000 mg by mouth every 6 (six) hours as needed for moderate pain.   aspirin EC 81 MG tablet Take 81 mg by mouth daily. Swallow whole.   cephALEXin (KEFLEX) 500 MG capsule Take 1 capsule (500 mg total) by mouth 2 (two) times daily.   cholecalciferol (VITAMIN D) 1000 units tablet Take 1,000 Units by mouth daily.   clonazePAM (KLONOPIN) 0.5 MG tablet Take 1 tablet by mouth as needed   diltiazem (CARDIZEM CD) 180 MG 24 hr capsule Take 1 capsule (180 mg total) by mouth daily.   Fexofenadine HCl (MUCINEX ALLERGY PO) Take 1 tablet by mouth daily at 6 (six) AM.   fluticasone (FLONASE) 50 MCG/ACT nasal spray Place 1 spray into both nostrils daily.   ibuprofen (ADVIL) 600 MG tablet Take 1 tablet (600 mg total) by mouth 2 (two) times daily as needed. (Patient taking differently: Take 600 mg by mouth 2 (two) times daily as needed for moderate pain.)   icosapent Ethyl (VASCEPA) 1 g capsule Take 2 g by mouth 2 (two) times daily.   linaclotide (LINZESS)  290 MCG CAPS capsule Take 1 capsule (290 mcg total) by mouth daily. (Patient taking differently: Take 290 mcg by mouth daily before breakfast.)   losartan (COZAAR) 100 MG tablet Take 100 mg by mouth every morning.   oxyCODONE (OXY IR/ROXICODONE) 5 MG immediate release tablet Take 1 tablet (5 mg total) by mouth every 6 (six) hours as needed for severe pain or breakthrough pain.   pantoprazole (PROTONIX) 40 MG tablet Take 40 mg by mouth in the morning.   rosuvastatin (CRESTOR) 40 MG tablet Take 40 mg by mouth at bedtime.   solifenacin (VESICARE) 5 MG tablet TAKE 1 TABLET (5 MG TOTAL) BY MOUTH DAILY. (Patient taking differently: Take 5 mg by mouth as needed.)   carvedilol (COREG) 3.125 MG tablet Take 3.125 mg by mouth 2 (two) times daily with a meal.    No facility-administered encounter medications on file as of 05/03/2022.    Surgical History: Past Surgical History:  Procedure Laterality Date   [place]   COLONOSCOPY WITH PROPOFOL N/A 12/12/2016   Procedure: COLONOSCOPY WITH PROPOFOL;  Surgeon: Lollie Sails, MD;  Location: [facility] ENDOSCOPY;  Service: Endoscopy;  Laterality: N/A;   COLONOSCOPY WITH PROPOFOL N/A 07/14/2019   Procedure: COLONOSCOPY WITH PROPOFOL;  Surgeon: Toledo, Benay Pike, MD;  Location: [facility] ENDOSCOPY;  Service: Gastroenterology;  Laterality:  N/A;   ESOPHAGOGASTRODUODENOSCOPY N/A 12/27/2021   Procedure: ESOPHAGOGASTRODUODENOSCOPY (EGD);  Surgeon: Benjamine Sprague, DO;  Location: [facility] ENDOSCOPY;  Service: General;  Laterality: N/A;   ESOPHAGOGASTRODUODENOSCOPY (EGD) WITH PROPOFOL N/A 12/12/2016   Procedure: ESOPHAGOGASTRODUODENOSCOPY (EGD) WITH PROPOFOL;  Surgeon: Lollie Sails, MD;  Location: [facility] ENDOSCOPY;  Service: Endoscopy;  Laterality: N/A;   ESOPHAGOGASTRODUODENOSCOPY (EGD) WITH PROPOFOL N/A 07/14/2019   Procedure: ESOPHAGOGASTRODUODENOSCOPY (EGD) WITH PROPOFOL;  Surgeon: Toledo, Benay Pike, MD;  Location: [facility]  ENDOSCOPY;  Service: Gastroenterology;  Laterality: N/A;   INSERTION OF MESH  02/05/2022   Procedure: INSERTION OF MESH;  Surgeon: Jules Husbands, MD;  Location: [facility] ORS;  Service: General;;   [place]   right   LEFT HEART CATH AND CORONARY ANGIOGRAPHY Left 09/25/2001   Procedure: LEFT HEART CATH AND CORONARY ANGIOGRAPHY; Location: [place]; Surgeon: Katrine Coho, MD   LEFT HEART CATH AND CORONARY ANGIOGRAPHY Left 06/09/2003   Procedure: LEFT HEART CATH AND CORONARY ANGIOGRAPHY; Location: [place]; Surgeon: Katrine Coho, MD   ORIF ANKLE FRACTURE Right    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    TONSILLECTOMY  1948   TUBAL LIGATION  6283   UMBILICAL HERNIA REPAIR N/A 02/05/2022   Procedure: HERNIA REPAIR UMBILICAL ADULT;  Surgeon: Jules Husbands, MD;  Location: [facility] ORS;  Service: General;  Laterality: N/A;   VAGINAL HYSTERECTOMY  1985   d/t heavy bleeding   XI ROBOTIC ASSISTED PARAESOPHAGEAL HERNIA REPAIR N/A 02/05/2022   Procedure: XI ROBOTIC ASSISTED PARAESOPHAGEAL HERNIA REPAIR, RNFA to assist;  Surgeon: Jules Husbands, MD;  Location: [facility] ORS;  Service: General;  Laterality: N/A;    Medical History: Past Medical History:  Diagnosis Date   Anxiety    Aortic atherosclerosis ([place])    Arthritis    Bilateral renal cysts    Coronary artery disease 09/25/2001   a.) LHC 09/25/2001: EF 78%. 20% pLAD - med mgmt; b.) LHC 06/09/2003: EF 63%. 20% pLCx, 20% pLAD, 50% mLAD, 50% dLAD - med mgmt.   Diastolic dysfunction 15/17/6160   a.) Stress echo 12/30/2017: ED >55%, triv PR, mild AR.MR, mod TR, G1DD; b.) TTE 03/30/2020: EF >55%, mild LVH, mild panvalvular regurgitation.   Gastritis    GERD (gastroesophageal reflux disease)    Hepatic steatosis    History of 2019 novel coronavirus disease (COVID-19) 01/11/2022   History of hiatal hernia    Hyperlipemia    Hyperplastic colon polyp    Hypertension    Pneumonia    PONV (postoperative  nausea and vomiting)    a.) PONV following orthopedic surgery (hip) in 2022   Prediabetes    Redundant colon    Tubular adenoma of colon    Umbilical hernia     Family History: Family History  Problem Relation Age of Onset   Heart failure Mother    Stroke Mother    Breast cancer Mother        dx at age 32 yo   Diabetes Mother    Osteoporosis Mother    Emphysema Father    Ovarian cancer Sister        dx at age 44 yo   Asthma Sister    Diabetes Daughter    Heart disease Daughter    COPD Daughter     Social History   Socioeconomic History   Marital status: Widowed    Spouse name: Not on file   Number of children: Not on file   Years of education: Not  on file   Highest education level: Not on file  Occupational History   Not on file  Tobacco Use   Smoking status: Former    Packs/day: 0.50    Years: 5.00    Total pack years: 2.50    Types: Cigarettes    Quit date: 26    Years since quitting: 44.0   Smokeless tobacco: Never  Vaping Use   Vaping Use: Never used  Substance and Sexual Activity   Alcohol use: No    Alcohol/week: 0.0 standard drinks of alcohol   Drug use: Never   Sexual activity: Not Currently  Other Topics Concern   Not on file  Social History Narrative   Not on file   Social Determinants of Health   Financial Resource Strain: Low Risk  (11/17/2020)   Overall Financial Resource Strain (CARDIA)    Difficulty of Paying Living Expenses: Not very hard  Food Insecurity: No Food Insecurity (02/05/2022)   Hunger Vital Sign    Worried About Running Out of Food in the Last Year: Never true    Ran Out of Food in the Last Year: Never true  Transportation Needs: No Transportation Needs (02/05/2022)   PRAPARE - Hydrologist (Medical): No    Lack of Transportation (Non-Medical): No  Physical Activity: Inactive (01/10/2019)   Exercise Vital Sign    Days of Exercise per Week: 0 days    Minutes of Exercise per Session: 0 min   Stress: No Stress Concern Present (01/10/2019)   [place]    Feeling of Stress : Not at all  Social Connections: Socially Isolated (01/10/2019)   Social Connection and Isolation Panel [NHANES]    Frequency of Communication with Friends and Family: Three times a week    Frequency of Social Gatherings with Friends and Family: Three times a week    Attends Religious Services: Never    Active Member of Clubs or Organizations: No    Attends Archivist Meetings: Never    Marital Status: Widowed  Intimate Partner Violence: Not At Risk (02/05/2022)   Humiliation, Afraid, Rape, and Kick questionnaire    Fear of Current or Ex-Partner: No    Emotionally Abused: No    Physically Abused: No    Sexually Abused: No      Review of Systems  Constitutional:  Negative for chills, fatigue and unexpected weight change.  HENT:  Positive for dental problem. Negative for congestion, postnasal drip, rhinorrhea, sneezing and sore throat.   Eyes:  Negative for redness.  Respiratory:  Negative for cough, chest tightness and shortness of breath.   Cardiovascular:  Negative for chest pain and palpitations.  Gastrointestinal:  Negative for abdominal pain, constipation, diarrhea, nausea and vomiting.  Genitourinary:  Negative for dysuria and frequency.  Musculoskeletal:  Negative for arthralgias, back pain, joint swelling and neck pain.  Skin:  Negative for rash.  Neurological: Negative.  Negative for tremors and numbness.  Hematological:  Negative for adenopathy. Does not bruise/bleed easily.  Psychiatric/Behavioral:  Negative for behavioral problems (Depression), sleep disturbance and suicidal ideas. The patient is not nervous/anxious.     Vital Signs: BP 132/80 Comment: 145/75  Pulse 68   Temp 98.3 F (36.8 C)   Resp 16   Ht 5' 5.5" (1.664 m)   Wt 167 lb (75.8 kg)   SpO2 95%   BMI 27.37 kg/m    Physical Exam Vitals and  nursing note reviewed.  Constitutional:      General: She is not in acute distress.    Appearance: Normal appearance. She is well-developed. She is not diaphoretic.  HENT:     Head: Normocephalic and atraumatic.     Mouth/Throat:     Pharynx: No oropharyngeal exudate.  Eyes:     Pupils: Pupils are equal, round, and reactive to light.  Neck:     Thyroid: No thyromegaly.     Vascular: No JVD.     Trachea: No tracheal deviation.  Cardiovascular:     Rate and Rhythm: Normal rate and regular rhythm.     Heart sounds: Normal heart sounds. No murmur heard.    No friction rub. No gallop.  Pulmonary:     Effort: Pulmonary effort is normal. No respiratory distress.     Breath sounds: No wheezing or rales.  Chest:     Chest wall: No tenderness.  Abdominal:     General: Bowel sounds are normal.     Palpations: Abdomen is soft.  Musculoskeletal:        General: Normal range of motion.     Cervical back: Normal range of motion and neck supple.  Lymphadenopathy:     Cervical: No cervical adenopathy.  Skin:    General: Skin is warm and dry.  Neurological:     Mental Status: She is alert and oriented to person, place, and time.     Cranial Nerves: No cranial nerve deficit.  Psychiatric:        Behavior: Behavior normal.        Thought Content: Thought content normal.        Judgment: Judgment normal.        Assessment/Plan: 1. Benign essential HTN Improved, continue current medications  2. Generalized anxiety disorder Stable, continue current medications  3. Pain, dental Improving since ABX and will follow up with dentist/oral surgeon   General Counseling: Pincus Large understanding of the findings of todays visit and agrees with plan of treatment. I have discussed any further diagnostic evaluation that may be needed or ordered today. We also reviewed her medications today. she has been encouraged to call the office with any questions or concerns that should arise related  to todays visit.    No orders of the defined types were placed in this encounter.   No orders of the defined types were placed in this encounter.   This patient was seen by Drema Dallas, PA-C in collaboration with Dr. Clayborn Bigness as a part of collaborative care agreement.   Total time spent:30 Minutes Time spent includes review of chart, medications, test results, and follow up plan with the patient.      Dr Lavera Guise Internal medicine

## 2022-05-27 ENCOUNTER — Telehealth: Payer: Self-pay

## 2022-05-27 ENCOUNTER — Encounter: Payer: Medicare Other | Admitting: Surgery

## 2022-05-27 NOTE — Telephone Encounter (Signed)
Spoke to patient, she will watch her blood pressure and will let us know.

## 2022-05-29 ENCOUNTER — Encounter: Payer: Self-pay | Admitting: Emergency Medicine

## 2022-05-29 ENCOUNTER — Emergency Department: Payer: 59

## 2022-05-29 ENCOUNTER — Emergency Department
Admission: EM | Admit: 2022-05-29 | Discharge: 2022-05-30 | Disposition: A | Discharge status: Home / Self Care | Payer: 59 | Attending: Emergency Medicine | Admitting: Emergency Medicine

## 2022-05-29 DIAGNOSIS — K8020 Calculus of gallbladder without cholecystitis without obstruction: Secondary | ICD-10-CM

## 2022-05-29 DIAGNOSIS — E162 Hypoglycemia, unspecified: Secondary | ICD-10-CM | POA: Diagnosis not present

## 2022-05-29 DIAGNOSIS — I5030 Unspecified diastolic (congestive) heart failure: Secondary | ICD-10-CM | POA: Diagnosis not present

## 2022-05-29 DIAGNOSIS — R6889 Other general symptoms and signs: Secondary | ICD-10-CM | POA: Diagnosis not present

## 2022-05-29 DIAGNOSIS — I110 Hypertensive heart disease with heart failure: Secondary | ICD-10-CM | POA: Diagnosis not present

## 2022-05-29 DIAGNOSIS — I2510 Atherosclerotic heart disease of native coronary artery without angina pectoris: Secondary | ICD-10-CM | POA: Insufficient documentation

## 2022-05-29 DIAGNOSIS — Z743 Need for continuous supervision: Secondary | ICD-10-CM | POA: Diagnosis not present

## 2022-05-29 DIAGNOSIS — R61 Generalized hyperhidrosis: Secondary | ICD-10-CM | POA: Diagnosis not present

## 2022-05-29 DIAGNOSIS — R42 Dizziness and giddiness: Secondary | ICD-10-CM | POA: Diagnosis not present

## 2022-05-29 DIAGNOSIS — R112 Nausea with vomiting, unspecified: Secondary | ICD-10-CM | POA: Diagnosis not present

## 2022-05-29 DIAGNOSIS — E11649 Type 2 diabetes mellitus with hypoglycemia without coma: Secondary | ICD-10-CM | POA: Diagnosis not present

## 2022-05-29 DIAGNOSIS — J90 Pleural effusion, not elsewhere classified: Secondary | ICD-10-CM | POA: Diagnosis not present

## 2022-05-29 DIAGNOSIS — R1011 Right upper quadrant pain: Secondary | ICD-10-CM | POA: Diagnosis not present

## 2022-05-29 LAB — COMPREHENSIVE METABOLIC PANEL
ALT: 10 U/L (ref 0–44)
AST: 18 U/L (ref 15–41)
Albumin: 3.7 g/dL (ref 3.5–5.0)
Alkaline Phosphatase: 82 U/L (ref 38–126)
Anion gap: 9 (ref 5–15)
BUN: 17 mg/dL (ref 8–23)
CO2: 25 mmol/L (ref 22–32)
Calcium: 8.5 mg/dL — ABNORMAL LOW (ref 8.9–10.3)
Chloride: 105 mmol/L (ref 98–111)
Creatinine, Ser: 0.94 mg/dL (ref 0.44–1.00)
GFR, Estimated: >60 mL/min (ref >60)
Glucose, Bld: 145 mg/dL — ABNORMAL HIGH (ref 70–99)
Potassium: 3.6 mmol/L (ref 3.5–5.1)
Sodium: 139 mmol/L (ref 135–145)
Total Bilirubin: 0.7 mg/dL (ref 0.3–1.2)
Total Protein: 6.9 g/dL (ref 6.5–8.1)

## 2022-05-29 LAB — CBC WITH DIFFERENTIAL/PLATELET
Abs Immature Granulocytes: 0.03 10*3/uL (ref 0.00–0.07)
Basophils Absolute: 0.1 10*3/uL (ref 0.0–0.1)
Basophils Relative: 1 %
Eosinophils Absolute: 0.1 10*3/uL (ref 0.0–0.5)
Eosinophils Relative: 1 %
HCT: 38.8 % (ref 36.0–46.0)
Hemoglobin: 12.2 g/dL (ref 12.0–15.0)
Immature Granulocytes: 0 %
Lymphocytes Relative: 17 %
Lymphs Abs: 1.6 10*3/uL (ref 0.7–4.0)
MCH: 26.5 pg (ref 26.0–34.0)
MCHC: 31.4 g/dL (ref 30.0–36.0)
MCV: 84.2 fL (ref 80.0–100.0)
Monocytes Absolute: 0.8 10*3/uL (ref 0.1–1.0)
Monocytes Relative: 9 %
Neutro Abs: 6.8 10*3/uL (ref 1.7–7.7)
Neutrophils Relative %: 72 %
Platelets: 246 10*3/uL (ref 150–400)
RBC: 4.61 MIL/uL (ref 3.87–5.11)
RDW: 16 % — ABNORMAL HIGH (ref 11.5–15.5)
WBC: 9.5 10*3/uL (ref 4.0–10.5)
nRBC: 0 % (ref 0.0–0.2)

## 2022-05-29 LAB — CBG MONITORING, ED: Glucose-Capillary: 141 mg/dL — ABNORMAL HIGH (ref 70–99)

## 2022-05-29 NOTE — ED Triage Notes (Signed)
First Nurse Note:  [facility] called to home.  Inially CBG:  44.  Patient with no history of DM.  I tube of glucose given, repeat CBG:  228.    VS wnl

## 2022-05-29 NOTE — ED Provider Notes (Signed)
   [facility] Provider Note    Event Date/Time   First MD Initiated Contact with Patient 05/29/22 2257     (approximate)   History   No chief complaint on file.   HPI  Amanda Duke is a 82 y.o. female who presents to the ED for evaluation of No chief complaint on file.   I review 1/5 clinic visit, hx CAD, dCHF, HTN, HLD. No hx DM noted or associated medications.      Physical Exam   Triage Vital Signs: ED Triage Vitals  Enc Vitals Group     BP 05/29/22 1950 (!) 144/84     Pulse Rate 05/29/22 1950 62     Resp 05/29/22 1950 18     Temp 05/29/22 1950 97.6 F (36.4 C)     Temp Source 05/29/22 1950 Oral     SpO2 05/29/22 1950 99 %     Weight 05/29/22 1949 156 lb (70.8 kg)     Height 05/29/22 1949 '5\' 5"'$  (1.651 m)     Head Circumference --      Peak Flow --      Pain Score 05/29/22 1954 0     Pain Loc --      Pain Edu? --      Excl. in [place]? --     Most recent vital signs: Vitals:   05/29/22 1950 05/29/22 2305  BP: (!) 144/84 (!) 150/62  Pulse: 62 64  Resp: 18 13  Temp: 97.6 F (36.4 C)   SpO2: 99% 100%    General: Awake, no distress. *** CV:  Good peripheral perfusion.  Resp:  Normal effort.  Abd:  No distention.  MSK:  No deformity noted.  Neuro:  No focal deficits appreciated. Other:     ED Results / Procedures / Treatments   Labs (all labs ordered are listed, but only abnormal results are displayed) Labs Reviewed  CBC WITH DIFFERENTIAL/PLATELET - Abnormal; Notable for the following components:      Result Value   RDW 16.0 (*)    All other components within normal limits  COMPREHENSIVE METABOLIC PANEL - Abnormal; Notable for the following components:   Glucose, Bld 145 (*)    Calcium 8.5 (*)    All other components within normal limits  CBG MONITORING, ED - Abnormal; Notable for the following components:   Glucose-Capillary 141 (*)    All other components within normal limits  URINALYSIS, ROUTINE W REFLEX  MICROSCOPIC    EKG ***  RADIOLOGY ***  Official radiology report(s): No results found.  PROCEDURES and INTERVENTIONS:  Procedures  Medications - No data to display   IMPRESSION / MDM / [place] / ED COURSE  I reviewed the triage vital signs and the nursing notes.  Differential diagnosis includes, but is not limited to, ***  {Patient presents with symptoms of an acute illness or injury that is potentially life-threatening.}      FINAL CLINICAL IMPRESSION(S) / ED DIAGNOSES   Final diagnoses:  None     Rx / DC Orders   ED Discharge Orders     None        Note:  This document was prepared using Dragon voice recognition software and may include unintentional dictation errors.

## 2022-05-29 NOTE — ED Triage Notes (Signed)
Pt presents via EMS with complaints of dizziness, nausea, and diaphoretic that started tonight. Pt called 911 and her CBG was 44. Pt was given 1 tube of glucose which improved her CBG with EMS to 228. No hx of DM - endorses decreased appetite since her surgery in October 2023. Denies CP or SOB.   CBG in triage 141.

## 2022-05-30 ENCOUNTER — Emergency Department: Payer: 59

## 2022-05-30 DIAGNOSIS — E162 Hypoglycemia, unspecified: Secondary | ICD-10-CM | POA: Diagnosis not present

## 2022-05-30 DIAGNOSIS — R1011 Right upper quadrant pain: Secondary | ICD-10-CM | POA: Diagnosis not present

## 2022-05-30 DIAGNOSIS — R112 Nausea with vomiting, unspecified: Secondary | ICD-10-CM | POA: Diagnosis not present

## 2022-05-30 HISTORY — DX: Hypoglycemia, unspecified: E16.2

## 2022-05-30 LAB — URINALYSIS, ROUTINE W REFLEX MICROSCOPIC
Bilirubin Urine: NEGATIVE
Glucose, UA: NEGATIVE mg/dL
Hgb urine dipstick: NEGATIVE
Ketones, ur: NEGATIVE mg/dL
Nitrite: NEGATIVE
Protein, ur: NEGATIVE mg/dL
Specific Gravity, Urine: 1.011 (ref 1.005–1.030)
pH: 6 (ref 5.0–8.0)

## 2022-05-30 LAB — CBG MONITORING, ED
Glucose-Capillary: 158 mg/dL — ABNORMAL HIGH (ref 70–99)
Glucose-Capillary: 96 mg/dL (ref 70–99)

## 2022-05-30 MED ORDER — ONDANSETRON 4 MG PO TBDP: 4.0000 mg | ORAL_TABLET | Freq: Three times a day (TID) | ORAL | 0 refills | Status: AC | PRN

## 2022-05-30 NOTE — Discharge Instructions (Signed)
Use Tylenol for pain and fevers.  Up to 1000 mg per dose, up to 4 times per day.  Do not take more than 4000 mg of Tylenol/acetaminophen within 24 hours..  Use the Zofran to help with nausea  Follow-up with the surgeon in the clinic to discuss possibly having her gallbladder removed  Return to the ED with any worsening symptoms

## 2022-05-31 ENCOUNTER — Telehealth: Payer: Self-pay | Admitting: Physician Assistant

## 2022-05-31 NOTE — Telephone Encounter (Signed)
Attempted to contact patient's son, Karma Greaser, to scheduled ED follow up. No answer, no vm-Toni

## 2022-06-03 ENCOUNTER — Ambulatory Visit (INDEPENDENT_AMBULATORY_CARE_PROVIDER_SITE_OTHER): Payer: 59 | Admitting: Surgery

## 2022-06-03 ENCOUNTER — Other Ambulatory Visit
Admission: RE | Admit: 2022-06-03 | Discharge: 2022-06-03 | Disposition: A | Discharge status: Home / Self Care | Payer: 59 | Attending: Surgery | Admitting: Surgery

## 2022-06-03 ENCOUNTER — Encounter: Payer: Self-pay | Admitting: Surgery

## 2022-06-03 ENCOUNTER — Other Ambulatory Visit: Payer: Self-pay

## 2022-06-03 VITALS — BP 185/97 | HR 69 | Temp 98.4°F | Ht 64.0 in | Wt 155.0 lb

## 2022-06-03 DIAGNOSIS — K8020 Calculus of gallbladder without cholecystitis without obstruction: Secondary | ICD-10-CM

## 2022-06-03 DIAGNOSIS — R109 Unspecified abdominal pain: Secondary | ICD-10-CM | POA: Diagnosis not present

## 2022-06-03 LAB — CBC WITH DIFFERENTIAL/PLATELET
Abs Immature Granulocytes: 0.01 10*3/uL (ref 0.00–0.07)
Basophils Absolute: 0.1 10*3/uL (ref 0.0–0.1)
Basophils Relative: 1 %
Eosinophils Absolute: 0.2 10*3/uL (ref 0.0–0.5)
Eosinophils Relative: 2 %
HCT: 40.5 % (ref 36.0–46.0)
Hemoglobin: 13 g/dL (ref 12.0–15.0)
Immature Granulocytes: 0 %
Lymphocytes Relative: 35 %
Lymphs Abs: 2.4 10*3/uL (ref 0.7–4.0)
MCH: 26.9 pg (ref 26.0–34.0)
MCHC: 32.1 g/dL (ref 30.0–36.0)
MCV: 83.9 fL (ref 80.0–100.0)
Monocytes Absolute: 0.7 10*3/uL (ref 0.1–1.0)
Monocytes Relative: 10 %
Neutro Abs: 3.5 10*3/uL (ref 1.7–7.7)
Neutrophils Relative %: 52 %
Platelets: 278 10*3/uL (ref 150–400)
RBC: 4.83 MIL/uL (ref 3.87–5.11)
RDW: 16.1 % — ABNORMAL HIGH (ref 11.5–15.5)
WBC: 6.8 10*3/uL (ref 4.0–10.5)
nRBC: 0 % (ref 0.0–0.2)

## 2022-06-03 LAB — COMPREHENSIVE METABOLIC PANEL
ALT: 10 U/L (ref 0–44)
AST: 18 U/L (ref 15–41)
Albumin: 3.7 g/dL (ref 3.5–5.0)
Alkaline Phosphatase: 85 U/L (ref 38–126)
Anion gap: 11 (ref 5–15)
BUN: 14 mg/dL (ref 8–23)
CO2: 26 mmol/L (ref 22–32)
Calcium: 9.2 mg/dL (ref 8.9–10.3)
Chloride: 103 mmol/L (ref 98–111)
Creatinine, Ser: 0.89 mg/dL (ref 0.44–1.00)
GFR, Estimated: >60 mL/min (ref >60)
Glucose, Bld: 109 mg/dL — ABNORMAL HIGH (ref 70–99)
Potassium: 3.8 mmol/L (ref 3.5–5.1)
Sodium: 140 mmol/L (ref 135–145)
Total Bilirubin: 0.7 mg/dL (ref 0.3–1.2)
Total Protein: 7.1 g/dL (ref 6.5–8.1)

## 2022-06-03 NOTE — Patient Instructions (Addendum)
CT scheduled  06/04/2022 [facility] at 9:15 am. Check in at Registration desk.  Please go directly to the lab today at [facility]   Please see your follow up appointment listed below.

## 2022-06-04 ENCOUNTER — Ambulatory Visit
Admission: RE | Admit: 2022-06-04 | Discharge: 2022-06-04 | Disposition: A | Discharge status: Home / Self Care | Payer: 59 | Source: Ambulatory Visit | Attending: Surgery | Admitting: Surgery

## 2022-06-04 DIAGNOSIS — N281 Cyst of kidney, acquired: Secondary | ICD-10-CM | POA: Diagnosis not present

## 2022-06-04 DIAGNOSIS — R109 Unspecified abdominal pain: Secondary | ICD-10-CM

## 2022-06-04 DIAGNOSIS — K5730 Diverticulosis of large intestine without perforation or abscess without bleeding: Secondary | ICD-10-CM | POA: Diagnosis not present

## 2022-06-04 MED ORDER — IOHEXOL 350 MG/ML SOLN
100.0000 mL | Freq: Once | INTRAVENOUS | Status: AC | PRN
  Administered 2022-06-04: 100 mL via INTRAVENOUS

## 2022-06-05 ENCOUNTER — Telehealth: Payer: Self-pay

## 2022-06-05 DIAGNOSIS — Z1231 Encounter for screening mammogram for malignant neoplasm of breast: Secondary | ICD-10-CM | POA: Diagnosis not present

## 2022-06-05 NOTE — Telephone Encounter (Signed)
-----   Message from Jules Husbands, MD sent at 06/04/2022  4:11 PM EST ----- Please let her know the CT scan looks good , no complicaitons ----- Message ----- From: Interface, Rad Results In Sent: 06/04/2022   4:05 PM EST To: Jules Husbands, MD

## 2022-06-05 NOTE — Telephone Encounter (Signed)
Message left for patient to call back for her CT scan results. May follow up as scheduled.

## 2022-06-06 ENCOUNTER — Encounter: Payer: Self-pay | Admitting: Surgery

## 2022-06-06 NOTE — Progress Notes (Signed)
Outpatient Surgical Follow Up  06/06/2022  Amanda Duke is an 82 y.o. female.   Chief Complaint  Patient presents with   Routine Post Op    Paraesophageal hernia repair, umbilical hernia    HPI: Amanda Duke is 4 months out after paraesophageal hernia repair.  This was uneventful.  Recently went to the emergency room complaining of postprandial pain nausea and hypoglycemia.  SHe did have a ultrasound that I have personally reviewed showing evidence of cholelithiasis without cholecystitis. Today she is pain-free.  She says she has had some intermittent nausea with certain meals as well as some upper abdominal pain.  Past Medical History:  Diagnosis Date   Anxiety    Aortic atherosclerosis ([place])    Arthritis    Bilateral renal cysts    Coronary artery disease 09/25/2001   a.) LHC 09/25/2001: EF 78%. 20% pLAD - med mgmt; b.) LHC 06/09/2003: EF 63%. 20% pLCx, 20% pLAD, 50% mLAD, 50% dLAD - med mgmt.   Diastolic dysfunction 05/07/3233   a.) Stress echo 12/30/2017: ED >55%, triv PR, mild AR.MR, mod TR, G1DD; b.) TTE 03/30/2020: EF >55%, mild LVH, mild panvalvular regurgitation.   Gastritis    GERD (gastroesophageal reflux disease)    Hepatic steatosis    History of 2019 novel coronavirus disease (COVID-19) 01/11/2022   History of hiatal hernia    Hyperlipemia    Hyperplastic colon polyp    Hypertension    Pneumonia    PONV (postoperative nausea and vomiting)    a.) PONV following orthopedic surgery (hip) in 2022   Prediabetes    Redundant colon    Tubular adenoma of colon    Umbilical hernia     Past Surgical History:  Procedure Laterality Date   [place]   COLONOSCOPY WITH PROPOFOL N/A 12/12/2016   Procedure: COLONOSCOPY WITH PROPOFOL;  Surgeon: Lollie Sails, MD;  Location: [facility] ENDOSCOPY;  Service: Endoscopy;  Laterality: N/A;   COLONOSCOPY WITH PROPOFOL N/A 07/14/2019   Procedure: COLONOSCOPY WITH PROPOFOL;  Surgeon:  Toledo, Benay Pike, MD;  Location: [facility] ENDOSCOPY;  Service: Gastroenterology;  Laterality: N/A;   ESOPHAGOGASTRODUODENOSCOPY N/A 12/27/2021   Procedure: ESOPHAGOGASTRODUODENOSCOPY (EGD);  Surgeon: Benjamine Sprague, DO;  Location: [facility] ENDOSCOPY;  Service: General;  Laterality: N/A;   ESOPHAGOGASTRODUODENOSCOPY (EGD) WITH PROPOFOL N/A 12/12/2016   Procedure: ESOPHAGOGASTRODUODENOSCOPY (EGD) WITH PROPOFOL;  Surgeon: Lollie Sails, MD;  Location: [facility] ENDOSCOPY;  Service: Endoscopy;  Laterality: N/A;   ESOPHAGOGASTRODUODENOSCOPY (EGD) WITH PROPOFOL N/A 07/14/2019   Procedure: ESOPHAGOGASTRODUODENOSCOPY (EGD) WITH PROPOFOL;  Surgeon: Toledo, Benay Pike, MD;  Location: [facility] ENDOSCOPY;  Service: Gastroenterology;  Laterality: N/A;   INSERTION OF MESH  02/05/2022   Procedure: INSERTION OF MESH;  Surgeon: Jules Husbands, MD;  Location: [facility] ORS;  Service: General;;   [place]   right   LEFT HEART CATH AND CORONARY ANGIOGRAPHY Left 09/25/2001   Procedure: LEFT HEART CATH AND CORONARY ANGIOGRAPHY; Location: [place]; Surgeon: Katrine Coho, MD   LEFT HEART CATH AND CORONARY ANGIOGRAPHY Left 06/09/2003   Procedure: LEFT HEART CATH AND CORONARY ANGIOGRAPHY; Location: [place]; Surgeon: Katrine Coho, MD   ORIF ANKLE FRACTURE Right    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    TONSILLECTOMY  1948   TUBAL LIGATION  5732   UMBILICAL HERNIA REPAIR N/A 02/05/2022   Procedure: HERNIA REPAIR UMBILICAL ADULT;  Surgeon: Jules Husbands, MD;  Location: [facility] ORS;  Service: General;  Laterality: N/A;   VAGINAL HYSTERECTOMY  1985   d/t heavy bleeding   XI ROBOTIC ASSISTED PARAESOPHAGEAL HERNIA REPAIR N/A 02/05/2022   Procedure: XI ROBOTIC ASSISTED PARAESOPHAGEAL HERNIA REPAIR, RNFA to assist;  Surgeon: Jules Husbands, MD;  Location: [facility] ORS;  Service: General;  Laterality: N/A;    Family History  Problem Relation Age of Onset   Heart failure Mother    Stroke  Mother    Breast cancer Mother        dx at age 85 yo   Diabetes Mother    Osteoporosis Mother    Emphysema Father    Ovarian cancer Sister        dx at age 51 yo   Asthma Sister    Diabetes Daughter    Heart disease Daughter    COPD Daughter     Social History:  reports that she quit smoking about 44 years ago. Her smoking use included cigarettes. She has a 2.50 pack-year smoking history. She has never used smokeless tobacco. She reports that she does not drink alcohol and does not use drugs.  Allergies:  Allergies  Allergen Reactions   Amoxicillin Hives    Patient tolerated Ceftriaxone in ED on 01/10/19.    Clindamycin Diarrhea   Gemfibrozil Other (See Comments)    muscle ache   Levofloxacin Other (See Comments)    Muscle ache for several months   Metoprolol Other (See Comments)    "Lowers blood pressure extremely low"   Simvastatin Other (See Comments)    muscle ache   Macrobid [Nitrofurantoin Macrocrystal] Diarrhea   Niacin Rash   Nystatin Rash   Nystatin-Triamcinolone Rash   Sulfa Antibiotics Rash    Family history of medication allergy    Medications reviewed.    ROS Full ROS performed and is otherwise negative other than what is stated in HPI   BP (!) 185/97   Pulse 69   Temp 98.4 F (36.9 C) (Oral)   Ht '5\' 4"'$  (1.626 m)   Wt 155 lb (70.3 kg)   SpO2 100%   BMI 26.61 kg/m   Physical Exam CONSTITUTIONAL: NAD. EYES: Pupils are equal, round,  Sclera are non-icteric. EARS, NOSE, MOUTH AND THROAT:  The oral mucosa is pink and moist. Hearing is intact to voice. LYMPH NODES:  Lymph nodes in the neck are normal. RESPIRATORY:  Lungs are clear. There is normal respiratory effort, with equal breath sounds bilaterally, and without pathologic use of accessory muscles. CARDIOVASCULAR: Heart is regular without murmurs, gallops, or rubs. GI: The abdomen is  soft,  and nondistended.incisions healed, no hernias, no peritonitis  GU: Rectal deferred.    MUSCULOSKELETAL: Normal muscle strength and tone. No cyanosis or edema.   SKIN: Turgor is good and there are no pathologic skin lesions or ulcers. NEUROLOGIC: Motor and sensation is grossly normal. Cranial nerves are grossly intact. PSYCH:  Oriented to person, place and time. Affect is normal.  Assessment/Plan: Nausea and abdominal discomfort postprandial.  I do not think that this is related to her paraesophageal hernia repair.  She does have evidence of cholelithiasis.  This certainly can explain her symptoms. Her symptoms are not necessarily specific for any pathology.  We will start workup with a CT scan of the abdomen and pelvis to evaluate intra-abdominal anatomy and repair.  We will follow her in a week or 2.  Depending on findings may require diet modification versus potential cholecystectomy if we find that this is related to symptomatic  cholelithiasis. Note that I spent 30 minutes in this encounter including personally reviewing imaging studies, coordinating her care, placing orders and performing a proper documentation   Caroleen Hamman, MD [facility]

## 2022-06-07 ENCOUNTER — Telehealth: Payer: Self-pay

## 2022-06-07 NOTE — Telephone Encounter (Signed)
     Patient  visit on 2/1  at [place] you been able to follow up with your primary care physician? Yes   The patient was or was not able to obtain any needed medicine or equipment. Yes   Are there diet recommendations that you are having difficulty following? Na   Patient expresses understanding of discharge instructions and education provided has no other needs at this time.  Yes      [place] [phone] [address], [place], [place] [address] Phone: [phone] Email: [email or web address]

## 2022-06-10 ENCOUNTER — Encounter: Payer: Self-pay | Admitting: Nurse Practitioner

## 2022-06-10 ENCOUNTER — Ambulatory Visit: Payer: 59 | Admitting: Surgery

## 2022-06-10 ENCOUNTER — Ambulatory Visit (INDEPENDENT_AMBULATORY_CARE_PROVIDER_SITE_OTHER): Payer: 59 | Admitting: Nurse Practitioner

## 2022-06-10 ENCOUNTER — Encounter: Payer: Self-pay | Admitting: Surgery

## 2022-06-10 ENCOUNTER — Ambulatory Visit (INDEPENDENT_AMBULATORY_CARE_PROVIDER_SITE_OTHER): Payer: 59 | Admitting: Surgery

## 2022-06-10 VITALS — BP 160/80 | HR 68 | Temp 98.3°F | Ht 65.0 in | Wt 156.0 lb

## 2022-06-10 VITALS — BP 138/75 | HR 72 | Temp 97.3°F | Resp 16 | Ht 65.0 in | Wt 158.8 lb

## 2022-06-10 DIAGNOSIS — Z8639 Personal history of other endocrine, nutritional and metabolic disease: Secondary | ICD-10-CM

## 2022-06-10 DIAGNOSIS — R7301 Impaired fasting glucose: Secondary | ICD-10-CM | POA: Diagnosis not present

## 2022-06-10 DIAGNOSIS — K8020 Calculus of gallbladder without cholecystitis without obstruction: Secondary | ICD-10-CM | POA: Diagnosis not present

## 2022-06-10 DIAGNOSIS — R7303 Prediabetes: Secondary | ICD-10-CM | POA: Diagnosis not present

## 2022-06-10 LAB — POCT GLYCOSYLATED HEMOGLOBIN (HGB A1C): Hemoglobin A1C: 5.7 % — AB (ref 4.0–5.6)

## 2022-06-10 NOTE — Patient Instructions (Signed)
Eventually you will need to have your gallbladder removed. We can do this when you are ready.  We will have you follow up here in 3-4 weeks for a recheck.  If you symptoms worsen a lot before this call us to come in sooner.    This surgery will be done at [facility] by Dr Dahlia Byes. You will most likely be out of work 1-2 weeks for this surgery.  If you have FMLA or disability paperwork that needs filled out you may drop this off at our office or this can be faxed to [phone].  You will return after your post-op appointment with a lifting restriction for approximately 4 more weeks.  You will be able to eat anything you would like to following surgery. But, start by eating a bland diet and advance this as tolerated. The Gallbladder diet is below, please go as closely by this diet as possible prior to surgery to avoid any further attacks.     Laparoscopic Cholecystectomy Laparoscopic cholecystectomy is surgery to remove the gallbladder. The gallbladder is located in the upper right part of the abdomen, behind the liver. It is a storage sac for bile, which is produced in the liver. Bile aids in the digestion and absorption of fats. Cholecystectomy is often done for inflammation of the gallbladder (cholecystitis). This condition is usually caused by a buildup of gallstones (cholelithiasis) in the gallbladder. Gallstones can block the flow of bile, and that can result in inflammation and pain. In severe cases, emergency surgery may be required. If emergency surgery is not required, you will have time to prepare for the procedure. Laparoscopic surgery is an alternative to open surgery. Laparoscopic surgery has a shorter recovery time. Your common bile duct may also need to be examined during the procedure. If stones are found in the common bile duct, they may be removed. LET [facility] CARE PROVIDER KNOW ABOUT: Any allergies you have. All medicines you are taking, including vitamins, herbs, eye drops,  creams, and over-the-counter medicines. Previous problems you or members of your family have had with the use of anesthetics. Any blood disorders you have. Previous surgeries you have had.  Any medical conditions you have. RISKS AND COMPLICATIONS Generally, this is a safe procedure. However, problems may occur, including: Infection. Bleeding. Allergic reactions to medicines. Damage to other structures or organs. A stone remaining in the common bile duct. A bile leak from the cyst duct that is clipped when your gallbladder is removed. The need to convert to open surgery, which requires a larger incision in the abdomen. This may be necessary if your surgeon thinks that it is not safe to continue with a laparoscopic procedure. BEFORE THE PROCEDURE Ask your health care provider about: Changing or stopping your regular medicines. This is especially important if you are taking diabetes medicines or blood thinners. Taking medicines such as aspirin and ibuprofen. These medicines can thin your blood. Do not take these medicines before your procedure if your health care provider instructs you not to. Follow instructions from your health care provider about eating or drinking restrictions. Let your health care provider know if you develop a cold or an infection before surgery. Plan to have someone take you home after the procedure. Ask your health care provider how your surgical site will be marked or identified. You may be given antibiotic medicine to help prevent infection. PROCEDURE To reduce your risk of infection: Your health care team will wash or sanitize their hands. Your skin will be  washed with soap. An IV tube may be inserted into one of your veins. You will be given a medicine to make you fall asleep (general anesthetic). A breathing tube will be placed in your mouth. The surgeon will make several small cuts (incisions) in your abdomen. A thin, lighted tube (laparoscope) that has a  tiny camera on the end will be inserted through one of the small incisions. The camera on the laparoscope will send a picture to a TV screen (monitor) in the operating room. This will give the surgeon a good view inside your abdomen. A gas will be pumped into your abdomen. This will expand your abdomen to give the surgeon more room to perform the surgery. Other tools that are needed for the procedure will be inserted through the other incisions. The gallbladder will be removed through one of the incisions. After your gallbladder has been removed, the incisions will be closed with stitches (sutures), staples, or skin glue. Your incisions may be covered with a bandage (dressing). The procedure may vary among health care providers and hospitals. AFTER THE PROCEDURE Your blood pressure, heart rate, breathing rate, and blood oxygen level will be monitored often until the medicines you were given have worn off. You will be given medicines as needed to control your pain.   This information is not intended to replace advice given to you by your health care provider. Make sure you discuss any questions you have with your health care provider.   Document Released: 04/15/2005 Document Revised: 01/04/2015 Document Reviewed: 11/25/2012 Elsevier Interactive Patient Education 2016 [place] Diet for Gallbladder Conditions A low-fat diet can be helpful if you have pancreatitis or a gallbladder condition. With these conditions, your pancreas and gallbladder have trouble digesting fats. A healthy eating plan with less fat will help rest your pancreas and gallbladder and reduce your symptoms. WHAT DO I NEED TO KNOW ABOUT THIS DIET? Eat a low-fat diet. Reduce your fat intake to less than 20-30% of your total daily calories. This is less than 50-60 g of fat per day. Remember that you need some fat in your diet. Ask your dietician what your daily goal should be. Choose nonfat and low-fat healthy  foods. Look for the words "nonfat," "low fat," or "fat free." As a guide, look on the label and choose foods with less than 3 g of fat per serving. Eat only one serving. Avoid alcohol. Do not smoke. If you need help quitting, talk with your health care provider. Eat small frequent meals instead of three large heavy meals. WHAT FOODS CAN I EAT? Grains Include healthy grains and starches such as potatoes, wheat bread, fiber-rich cereal, and brown rice. Choose whole grain options whenever possible. In adults, whole grains should account for 45-65% of your daily calories.  Fruits and Vegetables Eat plenty of fruits and vegetables. Fresh fruits and vegetables add fiber to your diet. Meats and Other Protein Sources Eat lean meat such as chicken and pork. Trim any fat off of meat before cooking it. Eggs, fish, and beans are other sources of protein. In adults, these foods should account for 10-35% of your daily calories. Dairy Choose low-fat milk and dairy options. Dairy includes fat and protein, as well as calcium.  Fats and Oils Limit high-fat foods such as fried foods, sweets, baked goods, sugary drinks.  Other Creamy sauces and condiments, such as mayonnaise, can add extra fat. Think about whether or not you need to use them, or use smaller  amounts or low fat options. WHAT FOODS ARE NOT RECOMMENDED? High fat foods, such as: [employer]. Ice cream. [place] toast. Sweet rolls. Pizza. Cheese bread. Foods covered with batter, butter, creamy sauces, or cheese. Fried foods. Sugary drinks and desserts. Foods that cause gas or bloating   This information is not intended to replace advice given to you by your health care provider. Make sure you discuss any questions you have with your health care provider.   Document Released: 04/20/2013 Document Reviewed: 04/20/2013 Elsevier Interactive Patient Education [employer].

## 2022-06-10 NOTE — Progress Notes (Signed)
[facility] [place], [place] [address]  Internal MEDICINE  Office Visit Note  Patient Name: Amanda Duke  [id number]  [id number]  Date of Service: 06/10/2022  Chief Complaint  Patient presents with   Follow-up    ED f/u    HPI Amanda Duke presents for a follow-up visit for ED visit for episodes of hypoglycemia as low as 44.  Hypoglycemic episodes in December and then again in January. Persistent hyperglycemia on past metabolic labs  Has gall stones as well but no infection, heartburn, indigestion. Increase nausea and decreased appetite.  A1c is slightly elevated at 5.7. runs in the family      Current Medication: Outpatient Encounter Medications as of 06/10/2022  Medication Sig   acetaminophen (TYLENOL) 500 MG tablet Take 1,000 mg by mouth every 6 (six) hours as needed for moderate pain.   aspirin EC 81 MG tablet Take 81 mg by mouth daily. Swallow whole.   cephALEXin (KEFLEX) 500 MG capsule Take 1 capsule (500 mg total) by mouth 2 (two) times daily.   cholecalciferol (VITAMIN D) 1000 units tablet Take 1,000 Units by mouth daily.   clonazePAM (KLONOPIN) 0.5 MG tablet Take 1 tablet by mouth as needed   diltiazem (CARDIZEM CD) 180 MG 24 hr capsule Take 1 capsule (180 mg total) by mouth daily.   Fexofenadine HCl (MUCINEX ALLERGY PO) Take 1 tablet by mouth daily at 6 (six) AM.   fluticasone (FLONASE) 50 MCG/ACT nasal spray Place 1 spray into both nostrils daily.   ibuprofen (ADVIL) 600 MG tablet Take 1 tablet (600 mg total) by mouth 2 (two) times daily as needed. (Patient taking differently: Take 600 mg by mouth 2 (two) times daily as needed for moderate pain.)   icosapent Ethyl (VASCEPA) 1 g capsule Take 2 g by mouth 2 (two) times daily.   linaclotide (LINZESS) 290 MCG CAPS capsule Take 1 capsule (290 mcg total) by mouth daily. (Patient taking differently: Take 290 mcg by mouth daily before breakfast.)   losartan (COZAAR) 100 MG tablet Take 100 mg by mouth  every morning.   ondansetron (ZOFRAN-ODT) 4 MG disintegrating tablet Take 1 tablet (4 mg total) by mouth every 8 (eight) hours as needed.   pantoprazole (PROTONIX) 40 MG tablet Take 40 mg by mouth in the morning.   rosuvastatin (CRESTOR) 40 MG tablet Take 40 mg by mouth at bedtime.   solifenacin (VESICARE) 5 MG tablet TAKE 1 TABLET (5 MG TOTAL) BY MOUTH DAILY. (Patient taking differently: Take 5 mg by mouth as needed.)   carvedilol (COREG) 3.125 MG tablet Take 3.125 mg by mouth 2 (two) times daily with a meal.    No facility-administered encounter medications on file as of 06/10/2022.    Surgical History: Past Surgical History:  Procedure Laterality Date   [place]   COLONOSCOPY WITH PROPOFOL N/A 12/12/2016   Procedure: COLONOSCOPY WITH PROPOFOL;  Surgeon: Lollie Sails, MD;  Location: [facility] ENDOSCOPY;  Service: Endoscopy;  Laterality: N/A;   COLONOSCOPY WITH PROPOFOL N/A 07/14/2019   Procedure: COLONOSCOPY WITH PROPOFOL;  Surgeon: Toledo, Benay Pike, MD;  Location: [facility] ENDOSCOPY;  Service: Gastroenterology;  Laterality: N/A;   ESOPHAGOGASTRODUODENOSCOPY N/A 12/27/2021   Procedure: ESOPHAGOGASTRODUODENOSCOPY (EGD);  Surgeon: Benjamine Sprague, DO;  Location: [facility] ENDOSCOPY;  Service: General;  Laterality: N/A;   ESOPHAGOGASTRODUODENOSCOPY (EGD) WITH PROPOFOL N/A 12/12/2016   Procedure: ESOPHAGOGASTRODUODENOSCOPY (EGD) WITH PROPOFOL;  Surgeon: Lollie Sails, MD;  Location: [facility] ENDOSCOPY;  Service: Endoscopy;  Laterality: N/A;   ESOPHAGOGASTRODUODENOSCOPY (EGD) WITH PROPOFOL N/A 07/14/2019   Procedure: ESOPHAGOGASTRODUODENOSCOPY (EGD) WITH PROPOFOL;  Surgeon: Toledo, Benay Pike, MD;  Location: [facility] ENDOSCOPY;  Service: Gastroenterology;  Laterality: N/A;   INSERTION OF MESH  02/05/2022   Procedure: INSERTION OF MESH;  Surgeon: Jules Husbands, MD;  Location: [facility] ORS;  Service: General;;   [place]   right   LEFT HEART CATH AND CORONARY ANGIOGRAPHY Left 09/25/2001   Procedure: LEFT HEART CATH AND CORONARY ANGIOGRAPHY; Location: [place]; Surgeon: Katrine Coho, MD   LEFT HEART CATH AND CORONARY ANGIOGRAPHY Left 06/09/2003   Procedure: LEFT HEART CATH AND CORONARY ANGIOGRAPHY; Location: [place]; Surgeon: Katrine Coho, MD   ORIF ANKLE FRACTURE Right    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    TONSILLECTOMY  1948   TUBAL LIGATION  [id number]   UMBILICAL HERNIA REPAIR N/A 02/05/2022   Procedure: HERNIA REPAIR UMBILICAL ADULT;  Surgeon: Jules Husbands, MD;  Location: [facility] ORS;  Service: General;  Laterality: N/A;   VAGINAL HYSTERECTOMY  1985   d/t heavy bleeding   XI ROBOTIC ASSISTED PARAESOPHAGEAL HERNIA REPAIR N/A 02/05/2022   Procedure: XI ROBOTIC ASSISTED PARAESOPHAGEAL HERNIA REPAIR, RNFA to assist;  Surgeon: Jules Husbands, MD;  Location: [facility] ORS;  Service: General;  Laterality: N/A;    Medical History: Past Medical History:  Diagnosis Date   Anxiety    Aortic atherosclerosis ([place])    Arthritis    Bilateral renal cysts    Coronary artery disease 09/25/2001   a.) LHC 09/25/2001: EF 78%. 20% pLAD - med mgmt; b.) LHC 06/09/2003: EF 63%. 20% pLCx, 20% pLAD, 50% mLAD, 50% dLAD - med mgmt.   Diastolic dysfunction XX123456   a.) Stress echo 12/30/2017: ED >55%, triv PR, mild AR.MR, mod TR, G1DD; b.) TTE 03/30/2020: EF >55%, mild LVH, mild panvalvular regurgitation.   Gastritis    GERD (gastroesophageal reflux disease)    Hepatic steatosis    History of 2019 novel coronavirus disease (COVID-19) 01/11/2022   History of hiatal hernia    Hyperlipemia    Hyperplastic colon polyp    Hypertension    Pneumonia    PONV (postoperative nausea and vomiting)    a.) PONV following orthopedic surgery (hip) in 2022   Prediabetes    Redundant colon    Tubular adenoma of colon    Umbilical hernia     Family History: Family History  Problem Relation Age of Onset   Heart  failure Mother    Stroke Mother    Breast cancer Mother        dx at age 70 yo   Diabetes Mother    Osteoporosis Mother    Emphysema Father    Ovarian cancer Sister        dx at age 34 yo   Asthma Sister    Diabetes Daughter    Heart disease Daughter    COPD Daughter     Social History   Socioeconomic History   Marital status: Widowed    Spouse name: Not on file   Number of children: Not on file   Years of education: Not on file   Highest education level: Not on file  Occupational History   Not on file  Tobacco Use   Smoking status: Former    Packs/day: 0.50    Years: 5.00    Total pack years: 2.50    Types: Cigarettes  Quit date: 94    Years since quitting: 44.1    Passive exposure: Past   Smokeless tobacco: Never  Vaping Use   Vaping Use: Never used  Substance and Sexual Activity   Alcohol use: No    Alcohol/week: 0.0 standard drinks of alcohol   Drug use: Never   Sexual activity: Not Currently  Other Topics Concern   Not on file  Social History Narrative   Not on file   Social Determinants of Health   Financial Resource Strain: Low Risk  (11/17/2020)   Overall Financial Resource Strain (CARDIA)    Difficulty of Paying Living Expenses: Not very hard  Food Insecurity: No Food Insecurity (02/05/2022)   Hunger Vital Sign    Worried About Running Out of Food in the Last Year: Never true    Ran Out of Food in the Last Year: Never true  Transportation Needs: No Transportation Needs (02/05/2022)   PRAPARE - Hydrologist (Medical): No    Lack of Transportation (Non-Medical): No  Physical Activity: Inactive (01/10/2019)   Exercise Vital Sign    Days of Exercise per Week: 0 days    Minutes of Exercise per Session: 0 min  Stress: No Stress Concern Present (01/10/2019)   [place]    Feeling of Stress : Not at all  Social Connections: Socially Isolated (01/10/2019)    Social Connection and Isolation Panel [NHANES]    Frequency of Communication with Friends and Family: Three times a week    Frequency of Social Gatherings with Friends and Family: Three times a week    Attends Religious Services: Never    Active Member of Clubs or Organizations: No    Attends Archivist Meetings: Never    Marital Status: Widowed  Intimate Partner Violence: Not At Risk (02/05/2022)   Humiliation, Afraid, Rape, and Kick questionnaire    Fear of Current or Ex-Partner: No    Emotionally Abused: No    Physically Abused: No    Sexually Abused: No      Review of Systems  Constitutional:  Negative for chills, fatigue and unexpected weight change.  HENT:  Negative for congestion, dental problem, postnasal drip, rhinorrhea, sneezing and sore throat.   Eyes:  Negative for redness.  Respiratory: Negative.  Negative for cough, chest tightness, shortness of breath and wheezing.   Cardiovascular: Negative.  Negative for chest pain and palpitations.  Gastrointestinal:  Negative for abdominal pain, constipation, diarrhea, nausea and vomiting.  Genitourinary:  Negative for dysuria and frequency.  Musculoskeletal:  Negative for arthralgias, back pain, joint swelling and neck pain.  Skin:  Negative for rash.  Neurological: Negative.  Negative for tremors and numbness.  Hematological:  Negative for adenopathy. Does not bruise/bleed easily.  Psychiatric/Behavioral:  Negative for behavioral problems (Depression), sleep disturbance and suicidal ideas. The patient is not nervous/anxious.     Vital Signs: BP 138/75   Pulse 72   Temp (!) 97.3 F (36.3 C)   Resp 16   Ht '5\' 5"'$  (1.651 m)   Wt 158 lb 12.8 oz (72 kg)   SpO2 97%   BMI 26.43 kg/m    Physical Exam Vitals and nursing note reviewed.  Constitutional:      General: She is not in acute distress.    Appearance: Normal appearance. She is well-developed. She is not diaphoretic.  HENT:     Head: Normocephalic and  atraumatic.  Eyes:  Pupils: Pupils are equal, round, and reactive to light.  Neck:     Thyroid: No thyromegaly.     Vascular: No JVD.     Trachea: No tracheal deviation.  Cardiovascular:     Rate and Rhythm: Normal rate and regular rhythm.     Heart sounds: Normal heart sounds. No murmur heard.    No friction rub. No gallop.  Pulmonary:     Effort: Pulmonary effort is normal. No respiratory distress.     Breath sounds: No wheezing or rales.  Chest:     Chest wall: No tenderness.  Neurological:     Mental Status: She is alert and oriented to person, place, and time.     Cranial Nerves: No cranial nerve deficit.  Psychiatric:        Mood and Affect: Mood normal.        Behavior: Behavior normal.        Assessment/Plan: 1. Prediabetes A1c elevated, indicating prediabates. Discussed diet and lifestyle modifications. Handouts with information about prediabetes and type 2 diabetes provided to patient.                                                                                            - POCT glycosylated hemoglobin (Hb A1C)  2. Impaired fasting glucose Elevated fasting glucose and episodes of hypoglycemia, A1c done today. - POCT glycosylated hemoglobin (Hb A1C)  3. History of hypoglycemia Multiple episodes of hypoglycemia. Needs to screening for diabetes   General Counseling: Yuktha verbalizes understanding of the findings of todays visit and agrees with plan of treatment. I have discussed any further diagnostic evaluation that may be needed or ordered today. We also reviewed her medications today. she has been encouraged to call the office with any questions or concerns that should arise related to todays visit.    Orders Placed This Encounter  Procedures   POCT glycosylated hemoglobin (Hb A1C)    No orders of the defined types were placed in this encounter.   Return in about 1 month (around 07/09/2022) for F/U prediabetes with Lauren PCP check fasting glucose  and write down on log. .   Total time spent:30 Minutes Time spent includes review of chart, medications, test results, and follow up plan with the patient.    Controlled Substance Database was reviewed by me.  This patient was seen by Jonetta Osgood, FNP-C in collaboration with Dr. Clayborn Bigness as a part of collaborative care agreement.   Lon Klippel R. Valetta Fuller, MSN, FNP-C Internal medicine

## 2022-06-10 NOTE — Progress Notes (Signed)
Outpatient Surgical Follow Up  06/10/2022  Amanda Duke is an 82 y.o. female.   Chief Complaint  Patient presents with   Follow-up    HPI: 41-year-old female 4 months out from robotic paraesophageal hernia repair.  An issue now is intermittent right upper quadrant pain worsening after meals.  The pain is colicky intermittent moderate intensity.  No fevers no chills.  So far the ultrasound did show cholelithiasis vs polyp.  CT scan show intact repair without any complications related to previous surgery.  Note that I have personally reviewed the images  Past Medical History:  Diagnosis Date   Anxiety    Aortic atherosclerosis ([place])    Arthritis    Bilateral renal cysts    Coronary artery disease 09/25/2001   a.) LHC 09/25/2001: EF 78%. 20% pLAD - med mgmt; b.) LHC 06/09/2003: EF 63%. 20% pLCx, 20% pLAD, 50% mLAD, 50% dLAD - med mgmt.   Diastolic dysfunction XX123456   a.) Stress echo 12/30/2017: ED >55%, triv PR, mild AR.MR, mod TR, G1DD; b.) TTE 03/30/2020: EF >55%, mild LVH, mild panvalvular regurgitation.   Gastritis    GERD (gastroesophageal reflux disease)    Hepatic steatosis    History of 2019 novel coronavirus disease (COVID-19) 01/11/2022   History of hiatal hernia    Hyperlipemia    Hyperplastic colon polyp    Hypertension    Pneumonia    PONV (postoperative nausea and vomiting)    a.) PONV following orthopedic surgery (hip) in 2022   Prediabetes    Redundant colon    Tubular adenoma of colon    Umbilical hernia     Past Surgical History:  Procedure Laterality Date   [place]   COLONOSCOPY WITH PROPOFOL N/A 12/12/2016   Procedure: COLONOSCOPY WITH PROPOFOL;  Surgeon: Lollie Sails, MD;  Location: [facility] ENDOSCOPY;  Service: Endoscopy;  Laterality: N/A;   COLONOSCOPY WITH PROPOFOL N/A 07/14/2019   Procedure: COLONOSCOPY WITH PROPOFOL;  Surgeon: Toledo, Benay Pike, MD;  Location: [facility] ENDOSCOPY;  Service:  Gastroenterology;  Laterality: N/A;   ESOPHAGOGASTRODUODENOSCOPY N/A 12/27/2021   Procedure: ESOPHAGOGASTRODUODENOSCOPY (EGD);  Surgeon: Benjamine Sprague, DO;  Location: [facility] ENDOSCOPY;  Service: General;  Laterality: N/A;   ESOPHAGOGASTRODUODENOSCOPY (EGD) WITH PROPOFOL N/A 12/12/2016   Procedure: ESOPHAGOGASTRODUODENOSCOPY (EGD) WITH PROPOFOL;  Surgeon: Lollie Sails, MD;  Location: [facility] ENDOSCOPY;  Service: Endoscopy;  Laterality: N/A;   ESOPHAGOGASTRODUODENOSCOPY (EGD) WITH PROPOFOL N/A 07/14/2019   Procedure: ESOPHAGOGASTRODUODENOSCOPY (EGD) WITH PROPOFOL;  Surgeon: Toledo, Benay Pike, MD;  Location: [facility] ENDOSCOPY;  Service: Gastroenterology;  Laterality: N/A;   INSERTION OF MESH  02/05/2022   Procedure: INSERTION OF MESH;  Surgeon: Jules Husbands, MD;  Location: [facility] ORS;  Service: General;;   [place]   right   LEFT HEART CATH AND CORONARY ANGIOGRAPHY Left 09/25/2001   Procedure: LEFT HEART CATH AND CORONARY ANGIOGRAPHY; Location: [place]; Surgeon: Katrine Coho, MD   LEFT HEART CATH AND CORONARY ANGIOGRAPHY Left 06/09/2003   Procedure: LEFT HEART CATH AND CORONARY ANGIOGRAPHY; Location: [place]; Surgeon: Katrine Coho, MD   ORIF ANKLE FRACTURE Right    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    TONSILLECTOMY  1948   TUBAL LIGATION  [id number]   UMBILICAL HERNIA REPAIR N/A 02/05/2022   Procedure: HERNIA REPAIR UMBILICAL ADULT;  Surgeon: Jules Husbands, MD;  Location: [facility] ORS;  Service: General;  Laterality: N/A;   VAGINAL  HYSTERECTOMY  1985   d/t heavy bleeding   XI ROBOTIC ASSISTED PARAESOPHAGEAL HERNIA REPAIR N/A 02/05/2022   Procedure: XI ROBOTIC ASSISTED PARAESOPHAGEAL HERNIA REPAIR, RNFA to assist;  Surgeon: Jules Husbands, MD;  Location: [facility] ORS;  Service: General;  Laterality: N/A;    Family History  Problem Relation Age of Onset   Heart failure Mother    Stroke Mother    Breast cancer Mother        dx at age 79 yo    Diabetes Mother    Osteoporosis Mother    Emphysema Father    Ovarian cancer Sister        dx at age 28 yo   Asthma Sister    Diabetes Daughter    Heart disease Daughter    COPD Daughter     Social History:  reports that she quit smoking about 44 years ago. Her smoking use included cigarettes. She has a 2.50 pack-year smoking history. She has been exposed to tobacco smoke. She has never used smokeless tobacco. She reports that she does not drink alcohol and does not use drugs.  Allergies:  Allergies  Allergen Reactions   Amoxicillin Hives    Patient tolerated Ceftriaxone in ED on 01/10/19.    Clindamycin Diarrhea   Gemfibrozil Other (See Comments)    muscle ache   Levofloxacin Other (See Comments)    Muscle ache for several months   Metoprolol Other (See Comments)    "Lowers blood pressure extremely low"   Simvastatin Other (See Comments)    muscle ache   Macrobid [Nitrofurantoin Macrocrystal] Diarrhea   Niacin Rash   Nystatin Rash   Nystatin-Triamcinolone Rash   Sulfa Antibiotics Rash    Family history of medication allergy    Medications reviewed.    ROS Full ROS performed and is otherwise negative other than what is stated in HPI   BP (!) 160/80   Pulse 68   Temp 98.3 F (36.8 C)   Ht 5' 5"$  (1.651 m)   Wt 156 lb (70.8 kg)   SpO2 97%   BMI 25.96 kg/m   Physical Exam Vitals and nursing note reviewed. Exam conducted with a chaperone present.  Constitutional:      Appearance: Normal appearance. She is normal weight.  Eyes:     General:        Right eye: No discharge.        Left eye: No discharge.  Cardiovascular:     Rate and Rhythm: Normal rate and regular rhythm.     Heart sounds: No murmur heard.    No friction rub.  Pulmonary:     Effort: Pulmonary effort is normal. No respiratory distress.     Breath sounds: Normal breath sounds. No stridor. No wheezing or rhonchi.  Abdominal:     General: Abdomen is flat. There is no distension.      Palpations: Abdomen is soft. There is no mass.     Tenderness: There is no abdominal tenderness. There is no guarding or rebound.     Hernia: No hernia is present.  Genitourinary:    General: Normal vulva.  Musculoskeletal:        General: Normal range of motion.     Cervical back: Normal range of motion and neck supple. No rigidity or tenderness.  Skin:    General: Skin is warm and dry.     Capillary Refill: Capillary refill takes less than 2 seconds.  Neurological:     General: No  focal deficit present.     Mental Status: She is alert and oriented to person, place, and time.  Psychiatric:        Mood and Affect: Mood normal.        Behavior: Behavior normal.        Thought Content: Thought content normal.        Judgment: Judgment normal.     Assessment/Plan: 82 year old female status post paraesophageal hernia now with different type of symptoms mainly right upper quadrant pain consistent with biliary colic.  She now wants to wait a few more weeks before scheduling cholecystectomy.  She wants to make sure she is okay with intermittent hypoglycemic episodes and she wants to recover a little bit more.  I will see her back in about a month and then at that time we will talk once again about cholecystectomy.  Currently she is not toxic and does not need emergent surgical intervention Note that I spent 40 minutes in this encounter including personally reviewing imaging studies, coordinating her care, placing orders and performing a proper documentation   Caroleen Hamman, MD [place] Surgeon

## 2022-06-18 ENCOUNTER — Other Ambulatory Visit: Payer: Self-pay | Admitting: Physician Assistant

## 2022-06-18 DIAGNOSIS — I10 Essential (primary) hypertension: Secondary | ICD-10-CM

## 2022-06-19 ENCOUNTER — Ambulatory Visit: Payer: 59 | Admitting: Surgery

## 2022-06-21 ENCOUNTER — Encounter: Payer: Self-pay | Admitting: Nurse Practitioner

## 2022-07-08 ENCOUNTER — Other Ambulatory Visit: Payer: Self-pay

## 2022-07-08 ENCOUNTER — Telehealth: Payer: Self-pay | Admitting: Surgery

## 2022-07-08 ENCOUNTER — Telehealth: Payer: Self-pay

## 2022-07-08 ENCOUNTER — Ambulatory Visit (INDEPENDENT_AMBULATORY_CARE_PROVIDER_SITE_OTHER): Payer: 59 | Admitting: Surgery

## 2022-07-08 ENCOUNTER — Encounter: Payer: Self-pay | Admitting: Surgery

## 2022-07-08 VITALS — BP 177/83 | HR 64 | Temp 98.1°F | Ht 64.0 in | Wt 159.0 lb

## 2022-07-08 DIAGNOSIS — K8020 Calculus of gallbladder without cholecystitis without obstruction: Secondary | ICD-10-CM

## 2022-07-08 NOTE — Telephone Encounter (Signed)
Cardiology Clearance faxed to [facility] cardiology.

## 2022-07-08 NOTE — Patient Instructions (Signed)
Our surgery scheduler will call you within 24-48 hours to schedule your surgery. Please have the Blue surgery sheet available when speaking with her.    Gallbladder Eating Plan High blood cholesterol, obesity, a sedentary lifestyle, an unhealthy diet, and diabetes are risk factors for developing gallstones. If you have a gallbladder condition, you may have trouble digesting fats and tolerating high fat intake. Eating a low-fat diet can help reduce your symptoms and may be helpful before and after having surgery to remove your gallbladder (cholecystectomy). Your health care provider may recommend that you work with a dietitian to help you reduce the amount of fat in your diet. What are tips for following this plan? General guidelines Limit your fat intake to less than 30% of your total daily calories. If you eat around 1,800 calories each day, this means eating less than 60 grams (g) of fat per day. Fat is an important part of a healthy diet. Eating a low-fat diet can make it hard to maintain a healthy body weight. Ask your dietitian how much fat, calories, and other nutrients you need each day. Eat small, frequent meals throughout the day instead of three large meals. Drink at least 8-10 cups (1.9-2.4 L) of fluid a day. Drink enough fluid to keep your urine pale yellow. If you drink alcohol: Limit how much you have to: 0-1 drink a day for women who are not pregnant. 0-2 drinks a day for men. Know how much alcohol is in a drink. In the [place] one drink equals one 12 oz bottle of beer (355 mL), one 5 oz glass of wine (148 mL), or one 1 oz glass of hard liquor (44 mL). Reading food labels  Check nutrition facts on food labels for the amount of fat per serving. Choose foods with less than 3 grams of fat per serving. Shopping Choose nonfat and low-fat healthy foods. Look for the words "nonfat," "low-fat," or "fat-free." Avoid buying processed or prepackaged foods. Cooking Cook using low-fat  methods, such as baking, broiling, grilling, or boiling. Cook with small amounts of healthy fats, such as olive oil, grapeseed oil, canola oil, avocado oil, or sunflower oil. What foods are recommended?  All fresh, frozen, or canned fruits and vegetables. Whole grains. Low-fat or nonfat (skim) milk and yogurt. Lean meat, skinless poultry, fish, eggs, and beans. Low-fat protein supplement powders or drinks. Spices and herbs. The items listed above may not be a complete list of foods and beverages you can eat and drink. Contact a dietitian for more information. What foods are not recommended? High-fat foods. These include baked goods, fast food, fatty cuts of meat, ice cream, french toast, sweet rolls, pizza, cheese bread, foods covered with butter, creamy sauces, or cheese. Fried foods. These include french fries, tempura, battered fish, breaded chicken, fried breads, and sweets. Foods that cause bloating and gas. The items listed above may not be a complete list of foods that you should avoid. Contact a dietitian for more information. Summary A low-fat diet can be helpful if you have a gallbladder condition, or before and after gallbladder surgery. Limit your fat intake to less than 30% of your total daily calories. This is about 60 g of fat if you eat 1,800 calories each day. Eat small, frequent meals throughout the day instead of three large meals. This information is not intended to replace advice given to you by your health care provider. Make sure you discuss any questions you have with your health care provider. Document Revised:   03/30/2021 Document Reviewed: 03/30/2021 Elsevier Patient Education  2023 Elsevier Inc. Minimally Invasive Cholecystectomy Minimally invasive cholecystectomy is surgery to remove the gallbladder. The gallbladder is a pear-shaped organ that lies beneath the liver on the right side of the body. The gallbladder stores bile, which is a fluid that helps the body  digest fats. Cholecystectomy is often done to treat inflammation (irritation and swelling) of the gallbladder (cholecystitis). This condition is usually caused by a buildup of gallstones (cholelithiasis) in the gallbladder or when the fluid in the gall bladder becomes stagnant because gallstones get stuck in the ducts (tubes) and block the flow of bile. This can result in inflammation and pain. In severe cases, emergency surgery may be required. This procedure is done through small incisions in the abdomen, instead of one large incision. It is also called laparoscopic surgery. A thin scope with a camera (laparoscope) is inserted through one incision. Then surgical instruments are inserted through the other incisions. In some cases, a minimally invasive surgery may need to be changed to a surgery that is done through a larger incision. This is called open surgery. Tell a health care provider about: Any allergies you have. All medicines you are taking, including vitamins, herbs, eye drops, creams, and over-the-counter medicines. Any problems you or family members have had with anesthetic medicines. Any bleeding problems you have. Any surgeries you have had. Any medical conditions you have. Whether you are pregnant or may be pregnant. What are the risks? Generally, this is a safe procedure. However, problems may occur, including: Infection. Bleeding. Allergic reactions to medicines. Damage to nearby structures or organs. A gallstone remaining in the common bile duct. The common bile duct carries bile from the gallbladder to the small intestine. A bile leak from the liver or cystic duct after your gallbladder is removed. What happens before the procedure? When to stop eating and drinking Follow instructions from your health care provider about what you may eat and drink before your procedure. These may include: 8 hours before the procedure Stop eating most foods. Do not eat meat, fried foods, or  fatty foods. Eat only light foods, such as toast or crackers. All liquids are okay except energy drinks and alcohol. 6 hours before the procedure Stop eating. Drink only clear liquids, such as water, clear fruit juice, black coffee, plain tea, and sports drinks. Do not drink energy drinks or alcohol. 2 hours before the procedure Stop drinking all liquids. You may be allowed to take medicines with small sips of water. If you do not follow your health care provider's instructions, your procedure may be delayed or canceled. Medicines Ask your health care provider about: Changing or stopping your regular medicines. This is especially important if you are taking diabetes medicines or blood thinners. Taking medicines such as aspirin and ibuprofen. These medicines can thin your blood. Do not take these medicines unless your health care provider tells you to take them. Taking over-the-counter medicines, vitamins, herbs, and supplements. General instructions If you will be going home right after the procedure, plan to have a responsible adult: Take you home from the hospital or clinic. You will not be allowed to drive. Care for you for the time you are told. Do not use any products that contain nicotine or tobacco for at least 4 weeks before the procedure. These products include cigarettes, chewing tobacco, and vaping devices, such as e-cigarettes. If you need help quitting, ask your health care provider. Ask your health care provider: How your surgery   site will be marked. What steps will be taken to help prevent infection. These may include: Removing hair at the surgery site. Washing skin with a germ-killing soap. Taking antibiotic medicine. What happens during the procedure?  An IV will be inserted into one of your veins. You will be given one or both of the following: A medicine to help you relax (sedative). A medicine to make you fall asleep (general anesthetic). Your surgeon will make  several small incisions in your abdomen. The laparoscope will be inserted through one of the small incisions. The camera on the laparoscope will send images to a monitor in the operating room. This lets your surgeon see inside your abdomen. A gas will be pumped into your abdomen. This will expand your abdomen to give the surgeon more room to perform the surgery. Other tools that are needed for the procedure will be inserted through the other incisions. The gallbladder will be removed through one of the incisions. Your common bile duct may be examined. If stones are found in the common bile duct, they may be removed. After your gallbladder has been removed, the incisions will be closed with stitches (sutures), staples, or skin glue. Your incisions will be covered with a bandage (dressing). The procedure may vary among health care providers and hospitals. What happens after the procedure? Your blood pressure, heart rate, breathing rate, and blood oxygen level will be monitored until you leave the hospital or clinic. You will be given medicines as needed to control your pain. You may have a drain placed in the incision. The drain will be removed a day or two after the procedure. Summary Minimally invasive cholecystectomy, also called laparoscopic cholecystectomy, is surgery to remove the gallbladder using small incisions. Tell your health care provider about all the medical conditions you have and all the medicines you are taking for those conditions. Before the procedure, follow instructions about when to stop eating and drinking and changing or stopping medicines. Plan to have a responsible adult care for you for the time you are told after you leave the hospital or clinic. This information is not intended to replace advice given to you by your health care provider. Make sure you discuss any questions you have with your health care provider. Document Revised: 10/17/2020 Document Reviewed:  10/17/2020 Elsevier Patient Education  2023 Elsevier Inc.  

## 2022-07-08 NOTE — Telephone Encounter (Signed)
Patient has been advised of Pre-Admission date/time, and Surgery date at [facility].  Surgery Date: 07/18/22 Preadmission Testing Date: 07/10/22 (phone 1p-4p)  Patient has been made aware to call [phone], between 1-3:00pm the day before surgery, to find out what time to arrive for surgery.

## 2022-07-10 ENCOUNTER — Encounter
Admission: RE | Admit: 2022-07-10 | Discharge: 2022-07-10 | Disposition: A | Discharge status: Home / Self Care | Payer: 59 | Source: Ambulatory Visit | Attending: Surgery | Admitting: Surgery

## 2022-07-10 DIAGNOSIS — Z01818 Encounter for other preprocedural examination: Secondary | ICD-10-CM

## 2022-07-10 NOTE — Progress Notes (Signed)
Patient ID: Amanda Duke, female   DOB: 1940/09/25, 82 y.o.   MRN: [id number]  HPI Amanda Duke is a 82 y.o. female  5 months out from robotic paraesophageal hernia repair.  SHe did well from her recent surgeyr more recently has  intermittent right upper quadrant pain worsening after meals.  The pain is colicky intermittent moderate intensity.  No fevers no chills.  So far the ultrasound did show cholelithiasis vs polyp.  CT scan show intact repair without any complications related to previous surgery.  Note that I have personally reviewed the images  Definitively c/w biliary origin  HPI  Past Medical History:  Diagnosis Date   Anxiety    Aortic atherosclerosis ([place])    Arthritis    Bilateral renal cysts    Coronary artery disease 09/25/2001   a.) LHC 09/25/2001: EF 78%. 20% pLAD - med mgmt; b.) LHC 06/09/2003: EF 63%. 20% pLCx, 20% pLAD, 50% mLAD, 50% dLAD - med mgmt.   Diastolic dysfunction [id number]   a.) Stress echo 12/30/2017: ED >55%, triv PR, mild AR.MR, mod TR, G1DD; b.) TTE 03/30/2020: EF >55%, mild LVH, mild panvalvular regurgitation.   Gastritis    GERD (gastroesophageal reflux disease)    Hepatic steatosis    History of 2019 novel coronavirus disease (COVID-19) 01/11/2022   History of hiatal hernia    Hyperlipemia    Hyperplastic colon polyp    Hypertension    Hypoglycemia 05/2022   this is something new-seeing pcp for this currently   Pneumonia    PONV (postoperative nausea and vomiting)    a.) PONV following orthopedic surgery (hip) in 2022   Prediabetes    Redundant colon    Tubular adenoma of colon    Umbilical hernia     Past Surgical History:  Procedure Laterality Date   [place]   COLONOSCOPY WITH PROPOFOL N/A 12/12/2016   Procedure: COLONOSCOPY WITH PROPOFOL;  Surgeon: Lollie Sails, MD;  Location: [facility] ENDOSCOPY;  Service: Endoscopy;  Laterality: N/A;   COLONOSCOPY WITH PROPOFOL N/A  07/14/2019   Procedure: COLONOSCOPY WITH PROPOFOL;  Surgeon: Toledo, Benay Pike, MD;  Location: [facility] ENDOSCOPY;  Service: Gastroenterology;  Laterality: N/A;   ESOPHAGOGASTRODUODENOSCOPY N/A 12/27/2021   Procedure: ESOPHAGOGASTRODUODENOSCOPY (EGD);  Surgeon: Benjamine Sprague, DO;  Location: [facility] ENDOSCOPY;  Service: General;  Laterality: N/A;   ESOPHAGOGASTRODUODENOSCOPY (EGD) WITH PROPOFOL N/A 12/12/2016   Procedure: ESOPHAGOGASTRODUODENOSCOPY (EGD) WITH PROPOFOL;  Surgeon: Lollie Sails, MD;  Location: [facility] END[facility]COPY;  Service: Endoscopy;  Laterality: N/A;   ESOPHAGOGASTRODUODENOSCOPY (EGD) WITH PROPOFOL N/A 07/14/2019   Procedure: ESOPHAGOGASTRODUODENOSCOPY (EGD) WITH PROPOFOL;  Surgeon: Toledo, Benay Pike, MD;  Location: [facility] ENDOSCOPY;  Service: Gastroenterology;  Laterality: N/A;   INSERTION OF MESH  02/05/2022   Procedure: INSERTION OF MESH;  Surgeon: Jules Husbands, MD;  Location: [facility] ORS;  Service: General;;   [place]   right   LEFT HEART CATH AND CORONARY ANGIOGRAPHY Left 09/25/2001   Procedure: LEFT HEART CATH AND CORONARY ANGIOGRAPHY; Location: [place]; Surgeon: Katrine Coho, MD   LEFT HEART CATH AND CORONARY ANGIOGRAPHY Left 06/09/2003   Procedure: LEFT HEART CATH AND CORONARY ANGIOGRAPHY; Location: [place]; Surgeon: Katrine Coho, MD   ORIF ANKLE FRACTURE Right    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    TONSILLECTOMY  1948   TUBAL LIGATION  [id number]   UMBILICAL HERNIA REPAIR N/A 02/05/2022   Procedure:  HERNIA REPAIR UMBILICAL ADULT;  Surgeon: Jules Husbands, MD;  Location: [facility] ORS;  Service: General;  Laterality: N/A;   VAGINAL HYSTERECTOMY  1985   d/t heavy bleeding   XI ROBOTIC ASSISTED PARAESOPHAGEAL HERNIA REPAIR N/A 02/05/2022   Procedure: XI ROBOTIC ASSISTED PARAESOPHAGEAL HERNIA REPAIR, RNFA to assist;  Surgeon: Jules Husbands, MD;  Location: [facility] ORS;  Service: General;  Laterality: N/A;    Family History   Problem Relation Age of Onset   Heart failure Mother    Stroke Mother    Breast cancer Mother        dx at age 56 yo   Diabetes Mother    Osteoporosis Mother    Emphysema Father    Ovarian cancer Sister        dx at age 57 yo   Asthma Sister    Diabetes Daughter    Heart disease Daughter    COPD Daughter     Social History Social History   Tobacco Use   Smoking status: Former    Packs/day: 0.50    Years: 5.00    Total pack years: 2.50    Types: Cigarettes    Quit date: 1980    Years since quitting: 44.2    Passive exposure: Past   Smokeless tobacco: Never  Vaping Use   Vaping Use: Never used  Substance Use Topics   Alcohol use: No    Alcohol/week: 0.0 standard drinks of alcohol   Drug use: Never    Allergies  Allergen Reactions   Amoxicillin Hives    Patient tolerated Ceftriaxone in ED on 01/10/19.    Clindamycin Diarrhea   Gemfibrozil Other (See Comments)    muscle ache   Levofloxacin Other (See Comments)    Muscle ache for several months   Metoprolol Other (See Comments)    "Lowers blood pressure extremely low"   Simvastatin Other (See Comments)    muscle ache   Macrobid [Nitrofurantoin Macrocrystal] Diarrhea   Niacin Rash   Nystatin Rash   Nystatin-Triamcinolone Rash   Sulfa Antibiotics Rash    Family history of medication allergy    Current Outpatient Medications  Medication Sig Dispense Refill   acetaminophen (TYLENOL) 500 MG tablet Take 1,000 mg by mouth every 6 (six) hours as needed for moderate pain.     aspirin EC 81 MG tablet Take 81 mg by mouth daily. Swallow whole.     cholecalciferol (VITAMIN D) 1000 units tablet Take 1,000 Units by mouth daily. (Patient not taking: Reported on 07/10/2022)     clonazePAM (KLONOPIN) 0.5 MG tablet Take 1 tablet by mouth as needed (Patient taking differently: Take 0.5 mg by mouth as needed for anxiety. Take 1 tablet by mouth as needed) 30 tablet 1   diltiazem (CARDIZEM CD) 180 MG 24 hr capsule TAKE 1 CAPSULE  BY MOUTH EVERY DAY (Patient taking differently: Take 180 mg by mouth every morning.) 30 capsule 2   fluticasone (FLONASE) 50 MCG/ACT nasal spray Place 1 spray into both nostrils daily. (Patient taking differently: Place 1 spray into both nostrils as needed.) 16 g 1   icosapent Ethyl (VASCEPA) 1 g capsule Take 2 g by mouth 2 (two) times daily.     linaclotide (LINZESS) 290 MCG CAPS capsule Take 1 capsule (290 mcg total) by mouth daily. (Patient taking differently: Take 290 mcg by mouth daily before breakfast.) 30 capsule 3   losartan (COZAAR) 100 MG tablet Take 100 mg by mouth every morning.  ondansetron (ZOFRAN-ODT) 4 MG disintegrating tablet Take 1 tablet (4 mg total) by mouth every 8 (eight) hours as needed. (Patient taking differently: Take 4 mg by mouth every 8 (eight) hours as needed for nausea or vomiting.) 20 tablet 0   pantoprazole (PROTONIX) 40 MG tablet Take 40 mg by mouth as needed.     rosuvastatin (CRESTOR) 40 MG tablet Take 40 mg by mouth at bedtime.     solifenacin (VESICARE) 5 MG tablet TAKE 1 TABLET (5 MG TOTAL) BY MOUTH DAILY. (Patient taking differently: Take 5 mg by mouth as needed.) 90 tablet 1   carvedilol (COREG) 3.125 MG tablet Take 3.125 mg by mouth 2 (two) times daily with a meal.      No current facility-administered medications for this visit.     Review of Systems Full ROS  was asked and was negative except for the information on the HPI  Physical Exam Blood pressure (!) 177/83, pulse 64, temperature 98.1 F (36.7 C), temperature source Oral, height '5\' 4"'$  (1.626 m), weight 159 lb (72.1 kg), SpO2 100 %. CONSTITUTIONAL: NAD. EYES: Pupils are equal, round,  Sclera are non-icteric. EARS, NOSE, MOUTH AND THROAT: The oropharynx is clear. The oral mucosa is pink and moist. Hearing is intact to voice. LYMPH NODES:  Lymph nodes in the neck are normal. RESPIRATORY:  Lungs are clear. There is normal respiratory effort, with equal breath sounds bilaterally, and without  pathologic use of accessory muscles. CARDIOVASCULAR: Heart is regular without murmurs, gallops, or rubs. GI: The abdomen is  soft, nontender, and nondistended. There are no palpable masses. There is no hepatosplenomegaly. There are normal bowel sounds in all quadrants. GU: Rectal deferred.   MUSCULOSKELETAL: Normal muscle strength and tone. No cyanosis or edema.   SKIN: Turgor is good and there are no pathologic skin lesions or ulcers. NEUROLOGIC: Motor and sensation is grossly normal. Cranial nerves are grossly intact. PSYCH:  Oriented to person, place and time. Affect is normal.  Data Reviewed  I have personally reviewed the patient's imaging, laboratory findings and medical records.    Assessment/Plan 82 year old female status post paraesophageal hernia now with different type of symptoms mainly right upper quadrant pain consistent with biliary colic.  I do recommend cholecystectomy.   She is a good candidate for robotic approach The risks, benefits, complications, treatment options, and expected outcomes were discussed with the patient. The possibilities of bleeding, recurrent infection, finding a normal gallbladder, perforation of viscus organs, damage to surrounding structures, bile leak, abscess formation, needing a drain placed, the need for additional procedures, reaction to medication, pulmonary aspiration,  failure to diagnose a condition, the possible need to convert to an open procedure, and creating a complication requiring transfusion or operation were discussed with the patient. The patient and/or family concurred with the proposed plan, giving informed consent.   Note that I spent 40 minutes in this encounter including personally reviewing imaging studies, coordinating her care, placing orders and performing a proper documentation  Caroleen Hamman, MD [place] Surgeon 07/10/2022, 2:39 PM

## 2022-07-10 NOTE — H&P (View-Only) (Signed)
Patient ID: Amanda Duke, female   DOB: 09/23/1940, 81 y.o.   MRN: [id number]  HPI Amanda Duke is a 81 y.o. female  5 months out from robotic paraesophageal hernia repair.  SHe did well from her recent surgeyr more recently has  intermittent right upper quadrant pain worsening after meals.  The pain is colicky intermittent moderate intensity.  No fevers no chills.  So far the ultrasound did show cholelithiasis vs polyp.  CT scan show intact repair without any complications related to previous surgery.  Note that I have personally reviewed the images  Definitively c/w biliary origin  HPI  Past Medical History:  Diagnosis Date   Anxiety    Aortic atherosclerosis (HCC)    Arthritis    Bilateral renal cysts    Coronary artery disease 09/25/2001   a.) LHC 09/25/2001: EF 78%. 20% pLAD - med mgmt; b.) LHC 06/09/2003: EF 63%. 20% pLCx, 20% pLAD, 50% mLAD, 50% dLAD - med mgmt.   Diastolic dysfunction 12/30/2017   a.) Stress echo 12/30/2017: ED >55%, triv PR, mild AR.MR, mod TR, G1DD; b.) TTE 03/30/2020: EF >55%, mild LVH, mild panvalvular regurgitation.   Gastritis    GERD (gastroesophageal reflux disease)    Hepatic steatosis    History of 2019 novel coronavirus disease (COVID-19) 01/11/2022   History of hiatal hernia    Hyperlipemia    Hyperplastic colon polyp    Hypertension    Hypoglycemia 05/2022   this is something new-seeing pcp for this currently   Pneumonia    PONV (postoperative nausea and vomiting)    a.) PONV following orthopedic surgery (hip) in 2022   Prediabetes    Redundant colon    Tubular adenoma of colon    Umbilical hernia     Past Surgical History:  Procedure Laterality Date   APPENDECTOMY  1971   ARTHRODESIS  1980   BACK SURGERY  1980   COLONOSCOPY WITH PROPOFOL N/A 12/12/2016   Procedure: COLONOSCOPY WITH PROPOFOL;  Surgeon: Skulskie, Martin U, MD;  Location: [facility];  Service: Endoscopy;  Laterality: N/A;   COLONOSCOPY WITH PROPOFOL N/A  07/14/2019   Procedure: COLONOSCOPY WITH PROPOFOL;  Surgeon: Toledo, Teodoro K, MD;  Location: [facility];  Service: Gastroenterology;  Laterality: N/A;   ESOPHAGOGASTRODUODENOSCOPY N/A 12/27/2021   Procedure: ESOPHAGOGASTRODUODENOSCOPY (EGD);  Surgeon: Sakai, Isami, DO;  Location: [facility];  Service: General;  Laterality: N/A;   ESOPHAGOGASTRODUODENOSCOPY (EGD) WITH PROPOFOL N/A 12/12/2016   Procedure: ESOPHAGOGASTRODUODENOSCOPY (EGD) WITH PROPOFOL;  Surgeon: Skulskie, Martin U, MD;  Location: [facility];  Service: Endoscopy;  Laterality: N/A;   ESOPHAGOGASTRODUODENOSCOPY (EGD) WITH PROPOFOL N/A 07/14/2019   Procedure: ESOPHAGOGASTRODUODENOSCOPY (EGD) WITH PROPOFOL;  Surgeon: Toledo, Teodoro K, MD;  Location: [facility];  Service: Gastroenterology;  Laterality: N/A;   INSERTION OF MESH  02/05/2022   Procedure: INSERTION OF MESH;  Surgeon: Jamine Wingate F, MD;  Location: [facility];  Service: General;;   LAPAROSCOPIC UNILATERAL SALPINGO OOPHERECTOMY  1985   right   LEFT HEART CATH AND CORONARY ANGIOGRAPHY Left 09/25/2001   Procedure: LEFT HEART CATH AND CORONARY ANGIOGRAPHY; Location: [facility]; Surgeon: Dwyane Callwood, MD   LEFT HEART CATH AND CORONARY ANGIOGRAPHY Left 06/09/2003   Procedure: LEFT HEART CATH AND CORONARY ANGIOGRAPHY; Location: [facility]; Surgeon: Dwyane Callwood, MD   ORIF ANKLE FRACTURE Right    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    TONSILLECTOMY  1948   TUBAL LIGATION  1971   UMBILICAL HERNIA REPAIR N/A 02/05/2022   Procedure:   HERNIA REPAIR UMBILICAL ADULT;  Surgeon: Raydell Maners F, MD;  Location: [facility];  Service: General;  Laterality: N/A;   VAGINAL HYSTERECTOMY  1985   d/t heavy bleeding   XI ROBOTIC ASSISTED PARAESOPHAGEAL HERNIA REPAIR N/A 02/05/2022   Procedure: XI ROBOTIC ASSISTED PARAESOPHAGEAL HERNIA REPAIR, RNFA to assist;  Surgeon: Jamol Ginyard F, MD;  Location: [facility];  Service: General;  Laterality: N/A;    Family History   Problem Relation Age of Onset   Heart failure Mother    Stroke Mother    Breast cancer Mother        dx at age 89 yo   Diabetes Mother    Osteoporosis Mother    Emphysema Father    Ovarian cancer Sister        dx at age 73 yo   Asthma Sister    Diabetes Daughter    Heart disease Daughter    COPD Daughter     Social History Social History   Tobacco Use   Smoking status: Former    Packs/day: 0.50    Years: 5.00    Total pack years: 2.50    Types: Cigarettes    Quit date: 1980    Years since quitting: 44.2    Passive exposure: Past   Smokeless tobacco: Never  Vaping Use   Vaping Use: Never used  Substance Use Topics   Alcohol use: No    Alcohol/week: 0.0 standard drinks of alcohol   Drug use: Never    Allergies  Allergen Reactions   Amoxicillin Hives    Patient tolerated Ceftriaxone in ED on 01/10/19.    Clindamycin Diarrhea   Gemfibrozil Other (See Comments)    muscle ache   Levofloxacin Other (See Comments)    Muscle ache for several months   Metoprolol Other (See Comments)    "Lowers blood pressure extremely low"   Simvastatin Other (See Comments)    muscle ache   Macrobid [Nitrofurantoin Macrocrystal] Diarrhea   Niacin Rash   Nystatin Rash   Nystatin-Triamcinolone Rash   Sulfa Antibiotics Rash    Family history of medication allergy    Current Outpatient Medications  Medication Sig Dispense Refill   acetaminophen (TYLENOL) 500 MG tablet Take 1,000 mg by mouth every 6 (six) hours as needed for moderate pain.     aspirin EC 81 MG tablet Take 81 mg by mouth daily. Swallow whole.     cholecalciferol (VITAMIN D) 1000 units tablet Take 1,000 Units by mouth daily. (Patient not taking: Reported on 07/10/2022)     clonazePAM (KLONOPIN) 0.5 MG tablet Take 1 tablet by mouth as needed (Patient taking differently: Take 0.5 mg by mouth as needed for anxiety. Take 1 tablet by mouth as needed) 30 tablet 1   diltiazem (CARDIZEM CD) 180 MG 24 hr capsule TAKE 1 CAPSULE  BY MOUTH EVERY DAY (Patient taking differently: Take 180 mg by mouth every morning.) 30 capsule 2   fluticasone (FLONASE) 50 MCG/ACT nasal spray Place 1 spray into both nostrils daily. (Patient taking differently: Place 1 spray into both nostrils as needed.) 16 g 1   icosapent Ethyl (VASCEPA) 1 g capsule Take 2 g by mouth 2 (two) times daily.     linaclotide (LINZESS) 290 MCG CAPS capsule Take 1 capsule (290 mcg total) by mouth daily. (Patient taking differently: Take 290 mcg by mouth daily before breakfast.) 30 capsule 3   losartan (COZAAR) 100 MG tablet Take 100 mg by mouth every morning.       ondansetron (ZOFRAN-ODT) 4 MG disintegrating tablet Take 1 tablet (4 mg total) by mouth every 8 (eight) hours as needed. (Patient taking differently: Take 4 mg by mouth every 8 (eight) hours as needed for nausea or vomiting.) 20 tablet 0   pantoprazole (PROTONIX) 40 MG tablet Take 40 mg by mouth as needed.     rosuvastatin (CRESTOR) 40 MG tablet Take 40 mg by mouth at bedtime.     solifenacin (VESICARE) 5 MG tablet TAKE 1 TABLET (5 MG TOTAL) BY MOUTH DAILY. (Patient taking differently: Take 5 mg by mouth as needed.) 90 tablet 1   carvedilol (COREG) 3.125 MG tablet Take 3.125 mg by mouth 2 (two) times daily with a meal.      No current facility-administered medications for this visit.     Review of Systems Full ROS  was asked and was negative except for the information on the HPI  Physical Exam Blood pressure (!) 177/83, pulse 64, temperature 98.1 F (36.7 C), temperature source Oral, height 5' 4" (1.626 m), weight 159 lb (72.1 kg), SpO2 100 %. CONSTITUTIONAL: NAD. EYES: Pupils are equal, round,  Sclera are non-icteric. EARS, NOSE, MOUTH AND THROAT: The oropharynx is clear. The oral mucosa is pink and moist. Hearing is intact to voice. LYMPH NODES:  Lymph nodes in the neck are normal. RESPIRATORY:  Lungs are clear. There is normal respiratory effort, with equal breath sounds bilaterally, and without  pathologic use of accessory muscles. CARDIOVASCULAR: Heart is regular without murmurs, gallops, or rubs. GI: The abdomen is  soft, nontender, and nondistended. There are no palpable masses. There is no hepatosplenomegaly. There are normal bowel sounds in all quadrants. GU: Rectal deferred.   MUSCULOSKELETAL: Normal muscle strength and tone. No cyanosis or edema.   SKIN: Turgor is good and there are no pathologic skin lesions or ulcers. NEUROLOGIC: Motor and sensation is grossly normal. Cranial nerves are grossly intact. PSYCH:  Oriented to person, place and time. Affect is normal.  Data Reviewed  I have personally reviewed the patient's imaging, laboratory findings and medical records.    Assessment/Plan 81-year-old female status post paraesophageal hernia now with different type of symptoms mainly right upper quadrant pain consistent with biliary colic.  I do recommend cholecystectomy.   She is a good candidate for robotic approach The risks, benefits, complications, treatment options, and expected outcomes were discussed with the patient. The possibilities of bleeding, recurrent infection, finding a normal gallbladder, perforation of viscus organs, damage to surrounding structures, bile leak, abscess formation, needing a drain placed, the need for additional procedures, reaction to medication, pulmonary aspiration,  failure to diagnose a condition, the possible need to convert to an open procedure, and creating a complication requiring transfusion or operation were discussed with the patient. The patient and/or family concurred with the proposed plan, giving informed consent.   Note that I spent 40 minutes in this encounter including personally reviewing imaging studies, coordinating her care, placing orders and performing a proper documentation  Jakeline Dave, MD FACS General Surgeon 07/10/2022, 2:39 PM   

## 2022-07-10 NOTE — Progress Notes (Unsigned)
Received Cardiology Clearance from Dr Alveria Apley office. The patient is cleared at Low risk for surgery.

## 2022-07-10 NOTE — Patient Instructions (Signed)
Your procedure is scheduled on:07-18-22 Thursday Report to the Registration Desk on the 1st floor of the [facility].Then proceed to the 2nd floor Surgery Desk To find out your arrival time, please call [phone] between 1PM - 3PM on:07-17-22 Wednesday If your arrival time is 6:00 am, do not arrive before that time as the [place] entrance doors do not open until 6:00 am.  REMEMBER: Instructions that are not followed completely may result in serious medical risk, up to and including death; or upon the discretion of your surgeon and anesthesiologist your surgery may need to be rescheduled.  Do not eat food after midnight the night before surgery.  No gum chewing or hard candies.  You may however, drink CLEAR liquids up to 2 hours before you are scheduled to arrive for your surgery. Do not drink anything within 2 hours of your scheduled arrival time.  Clear liquids include: - water  - apple juice without pulp - gatorade (not RED colors) - black coffee or tea (Do NOT add milk or creamers to the coffee or tea) Do NOT drink anything that is not on this list.  One week prior to surgery: Stop Anti-inflammatories (NSAIDS) such as Advil, Aleve, Ibuprofen, Motrin, Naproxen, Naprosyn and Aspirin based products such as Excedrin, Goody's Powder, BC Powder.You may however, continue to take Tylenol if needed for pain up until the day of surgery.  Stop ANY OVER THE COUNTER supplements/vitamins NOW (07-10-22) until after surgery.  Stop your 81 mg Aspirin 5 days prior to surgery as instructed by Dr Corlis Leak office-Last dose will be on 07-12-22 (Friday)  TAKE ONLY THESE MEDICATIONS THE MORNING OF SURGERY WITH A SIP OF WATER: -carvedilol (COREG)  -diltiazem (CARDIZEM CD)  -pantoprazole (PROTONIX) -take one the night before and one on the morning of surgery - helps to prevent nausea after surgery.) -You may take your clonazePAM (KLONOPIN) if needed for anxiety -You may take your solifenacin  (VESICARE) if needed  No Alcohol for 24 hours before or after surgery.  No Smoking including e-cigarettes for 24 hours before surgery.  No chewable tobacco products for at least 6 hours before surgery.  No nicotine patches on the day of surgery.  Do not use any "recreational" drugs for at least a week (preferably 2 weeks) before your surgery.  Please be advised that the combination of cocaine and anesthesia may have negative outcomes, up to and including death. If you test positive for cocaine, your surgery will be cancelled.  On the morning of surgery brush your teeth with toothpaste and water, you may rinse your mouth with mouthwash if you wish. Do not swallow any toothpaste or mouthwash.  Use CHG Soap as directed on instruction sheet.  Do not wear jewelry, make-up, hairpins, clips or nail polish.  Do not wear lotions, powders, or perfumes.   Do not shave body hair from the neck down 48 hours before surgery.  Contact lenses, hearing aids and dentures may not be worn into surgery.  Do not bring valuables to the hospital. [facility] is not responsible for any missing/lost belongings or valuables.   Notify your doctor if there is any change in your medical condition (cold, fever, infection).  Wear comfortable clothing (specific to your surgery type) to the hospital.  After surgery, you can help prevent lung complications by doing breathing exercises.  Take deep breaths and cough every 1-2 hours. Your doctor may order a device called an Incentive Spirometer to help you take deep breaths. When coughing or  sneezing, hold a pillow firmly against your incision with both hands. This is called "splinting." Doing this helps protect your incision. It also decreases belly discomfort.  If you are being admitted to the hospital overnight, leave your suitcase in the car. After surgery it may be brought to your room.  In case of increased patient census, it may be necessary for you, the  patient, to continue your postoperative care in the Same Day Surgery department.  If you are being discharged the day of surgery, you will not be allowed to drive home. You will need a responsible individual to drive you home and stay with you for 24 hours after surgery.   If you are taking public transportation, you will need to have a responsible individual with you.  Please call the [facility] Dept. at [phone] if you have any questions about these instructions.  Surgery Visitation Policy:  Patients undergoing a surgery or procedure may have two family members or support persons with them as long as the person is not COVID-19 positive or experiencing its symptoms.   Due to an increase in RSV and influenza rates and associated hospitalizations, children ages 94 and under will not be able to visit patients in [facility]. Masks continue to be strongly recommended.     Preparing for Surgery with CHLORHEXIDINE GLUCONATE (CHG) Soap  Chlorhexidine Gluconate (CHG) Soap  o An antiseptic cleaner that kills germs and bonds with the skin to continue killing germs even after washing  o Used for showering the night before surgery and morning of surgery  Before surgery, you can play an important role by reducing the number of germs on your skin.  CHG (Chlorhexidine gluconate) soap is an antiseptic cleanser which kills germs and bonds with the skin to continue killing germs even after washing.  Please do not use if you have an allergy to CHG or antibacterial soaps. If your skin becomes reddened/irritated stop using the CHG.  1. Shower the NIGHT BEFORE SURGERY and the MORNING OF SURGERY with CHG soap.  2. If you choose to wash your hair, wash your hair first as usual with your normal shampoo.  3. After shampooing, rinse your hair and body thoroughly to remove the shampoo.  4. Use CHG as you would any other liquid soap. You can apply CHG directly to the skin and  wash gently with a scrungie or a clean washcloth.  5. Apply the CHG soap to your body only from the neck down. Do not use on open wounds or open sores. Avoid contact with your eyes, ears, mouth, and genitals (private parts). Wash face and genitals (private parts) with your normal soap.  6. Wash thoroughly, paying special attention to the area where your surgery will be performed.  7. Thoroughly rinse your body with warm water.  8. Do not shower/wash with your normal soap after using and rinsing off the CHG soap.  9. Pat yourself dry with a clean towel.  10. Wear clean pajamas to bed the night before surgery.  12. Place clean sheets on your bed the night of your first shower and do not sleep with pets.  13. Shower again with the CHG soap on the day of surgery prior to arriving at the hospital.  14. Do not apply any deodorants/lotions/powders.  15. Please wear clean clothes to the hospital.

## 2022-07-11 ENCOUNTER — Encounter: Payer: Self-pay | Admitting: Surgery

## 2022-07-11 ENCOUNTER — Ambulatory Visit (INDEPENDENT_AMBULATORY_CARE_PROVIDER_SITE_OTHER): Payer: 59 | Admitting: Physician Assistant

## 2022-07-11 ENCOUNTER — Encounter: Payer: Self-pay | Admitting: Physician Assistant

## 2022-07-11 VITALS — BP 138/78 | HR 77 | Temp 97.6°F | Resp 16 | Ht 64.0 in | Wt 158.0 lb

## 2022-07-11 DIAGNOSIS — F411 Generalized anxiety disorder: Secondary | ICD-10-CM | POA: Diagnosis not present

## 2022-07-11 DIAGNOSIS — R7303 Prediabetes: Secondary | ICD-10-CM | POA: Diagnosis not present

## 2022-07-11 DIAGNOSIS — I10 Essential (primary) hypertension: Secondary | ICD-10-CM | POA: Diagnosis not present

## 2022-07-11 MED ORDER — CLONAZEPAM 0.5 MG PO TABS: 0.5000 mg | ORAL_TABLET | ORAL | 1 refills | Status: DC | PRN

## 2022-07-11 NOTE — Progress Notes (Signed)
Perioperative / Anesthesia Services  Pre-Admission Testing Clinical Review / Preoperative Anesthesia Consult  Date: 07/11/22  Patient Demographics:  Name: Amanda Duke DOB:   1940-06-16 MRN:   [id number]  Planned Surgical Procedure(s):    Case: [id number] Date/Time: 07/18/22 1019   Procedures:      XI ROBOTIC ASSISTED LAPAROSCOPIC CHOLECYSTECTOMY     INDOCYANINE GREEN FLUORESCENCE IMAGING (ICG)   Anesthesia type: General   Pre-op diagnosis: biliary colic   Location: [facility] OR ROOM 07 / [place] [facility]   Surgeons: Amanda Husbands, MD     NOTE: Available PAT nursing documentation and vital signs have been reviewed. Clinical nursing staff has updated patient's PMH/PSHx, current medication list, and drug allergies/intolerances to ensure comprehensive history available to assist in medical decision making as it pertains to the aforementioned surgical procedure and anticipated anesthetic course. Extensive review of available clinical information personally performed. [place] PMH and PSHx updated with any diagnoses/procedures that  may have been inadvertently omitted during her intake with the pre-admission testing department's nursing staff.  Clinical Discussion:  Amanda Duke is a 82 y.o. female who is submitted for pre-surgical anesthesia review and clearance prior to her undergoing the above procedure. Patient is a Former Smoker (2.5 pack years; quit 04/1978). Pertinent PMH includes: CAD, PSVT, diastolic dysfunction, HTN, HLD, prediabetes, GERD (on daily PPI), hiatal hernia, gastritis, umbilical hernia, OA, anxiety.   Patient is followed by cardiology Amanda Massed, MD). She was last seen in the cardiology clinic on 02/14/2022; notes reviewed. At the time of her clinic visit, patient reporting elevated blood pressure and increased palpitations.  Patient was approximately 9 days status post esophageal hernia repair.  She was experiencing difficulty with taking some of her  medications. Patient denied any chest pain, shortness of breath, PND, orthopnea, significant peripheral edema, weakness, fatigue, vertiginous symptoms, or presyncope/syncope. Patient with a past medical history significant for cardiovascular diagnoses. Documented physical exam was grossly benign, providing no evidence of acute exacerbation and/or decompensation of the patient's known cardiovascular conditions.  Patient underwent diagnostic LEFT heart catheterization on 09/25/2001 revealing a normal left ventricular systolic function with a hyperdynamic LVEF of 78%.  There was single-vessel nonobstructive coronary artery disease noted; 20% proximal LAD.  Intervention was deferred opting for medical management.   Repeat diagnostic LEFT heart catheterization was performed on 06/09/2003 revealing normal left ventricular systolic function with an EF of 63%.  There was multivessel CAD; 20% proximal LCx, 20% proximal LAD, 50% mid LAD, 50% distal LAD.  Given the nonobstructive nature of her disease, the decision was made to defer intervention opting for medical management.   Stress echocardiogram was performed on 12/24/2017 revealing a normal left ventricular systolic function with an EF of >55%.  MPHR 144; patient achieved heart rate of 176 bpm. Left ventricular diastolic Doppler parameters consistent with abnormal relaxation (G1DD).  There was trivial pulmonary, mild aortic/mitral, and moderate tricuspid valve regurgitation.  All transvalvular gradients were noted to be normal providing no evidence suggestive of valvular stenosis.  Most recent TTE was performed on 03/30/2020 revealing a normal left ventricular systolic function with mild LVH; LVEF >55%.  Right ventricular size and function normal.  There was mild pan valvular regurgitation noted.  There was no evidence of a significant transvalvular gradient to suggest stenosis.   Most recent myocardial perfusion imaging study was performed on 03/30/2020  revealed a normal left ventricular systolic function with a hyperdynamic LVEF of 69%.  There were no regional wall motion abnormalities.  There was no evidence of stress-induced myocardial ischemia or arrhythmia; no scintigraphic evidence of scar.  Study determined to be normal and low risk.  Blood pressure was elevated at 178/102 mmHg on prescribed beta-blocker (carvedilol), CCB (diltiazem) and ARB (losartan) therapies. Patient is on rosuvastatin + icosapent ethyl for her HLD diagnosis and further ASCVD prevention.  She is not diabetic. Patient does not have an OSAH diagnosis.  Functional capacity limited by age and multiple medical comorbidities.  Following assessment, patient is not felt to be able to achieve 4 METS of physical activity without experiencing any degree of angina/anginal equivalent symptoms.  No changes were made to her medication regimen.  Patient to follow-up with outpatient cardiology and 1 year or sooner if needed.    Amanda Duke is scheduled for an XI ROBOTIC ASSISTED LAPAROSCOPIC CHOLECYSTECTOMY on 07/28/2022 with Dr. Caroleen Hamman, MD.  Given patient's past medical history significant for cardiovascular diagnoses, presurgical cardiac clearance was sought by the PAT team. Per cardiology, "this patient is optimized for surgery and may proceed with the planned procedural course with a LOW risk of significant perioperative cardiovascular complications"..    In review of her medication reconciliation, it is noted that patient is currently on prescribed daily antiplatelet therapy. She has been instructed on recommendations for holding her daily low-dose ASA for 5 days prior to her procedure with plans to restart as soon as postoperative bleeding risk felt to be minimized by her attending surgeon. The patient has been instructed that her last dose of her anticoagulant will be on 01/30/2022.   Patient reports previous perioperative complications with anesthesia in the past. Patient has a  PMH (+) for PONV. Symptoms and history of PONV will be discussed with patient by anesthesia team on the day of her procedure. Interventions will be ordered as deemed necessary based on patient's individual care needs as determined by anesthesiologist.  In review of the available records, it is noted that patient underwent a general anesthetic course here at [facility] in 01/2022 without documented complications.      07/08/2022   10:00 AM 06/10/2022    2:15 PM 06/10/2022   10:52 AM  Vitals with BMI  Height '5\' 4"'$  '5\' 5"'$  '5\' 5"'$   Weight 159 lbs 158 lbs 13 oz 156 lbs  BMI 27.28 [id number] 0000000  Systolic 123XX123 0000000 0000000  Diastolic 83 75 80  Pulse 64 72 68    Providers/Specialists:   NOTE: Primary physician provider listed below. Patient may have been seen by APP or partner within same practice.   PROVIDER ROLE / SPECIALTY LAST Suszanne Finch, MD General Surgery (Surgeon) 07/08/2022  Mylinda Latina, PA-C Primary Care Provider 06/10/2022  Serafina Royals, MD Cardiology 02/14/2022   Allergies:  Amoxicillin, Clindamycin, Gemfibrozil, Levofloxacin, Metoprolol, Simvastatin, Macrobid [nitrofurantoin macrocrystal], Niacin, Nystatin, Nystatin-triamcinolone, and Sulfa antibiotics  Current Home Medications:   No current facility-administered medications for this encounter.    acetaminophen (TYLENOL) 500 MG tablet   aspirin EC 81 MG tablet   carvedilol (COREG) 3.125 MG tablet   cholecalciferol (VITAMIN D) 1000 units tablet   clonazePAM (KLONOPIN) 0.5 MG tablet   diltiazem (CARDIZEM CD) 180 MG 24 hr capsule   fluticasone (FLONASE) 50 MCG/ACT nasal spray   icosapent Ethyl (VASCEPA) 1 g capsule   linaclotide (LINZESS) 290 MCG CAPS capsule   losartan (COZAAR) 100 MG tablet   ondansetron (ZOFRAN-ODT) 4 MG disintegrating tablet   pantoprazole (PROTONIX) 40 MG tablet  rosuvastatin (CRESTOR) 40 MG tablet   solifenacin (VESICARE) 5 MG tablet   History:    Past Medical History:  Diagnosis Date   Anxiety    Aortic atherosclerosis (HCC)    Arthritis    Bilateral renal cysts    Coronary artery disease 09/25/2001   a.) LHC 09/25/2001: EF 78%. 20% pLAD - med mgmt; b.) LHC 06/09/2003: EF 63%. 20% pLCx, 20% pLAD, 50% mLAD, 50% dLAD - med mgmt.   Diastolic dysfunction [id number]   a.) Stress echo 12/30/2017: ED >55%, triv PR, mild AR.MR, mod TR, G1DD; b.) TTE 03/30/2020: EF >55%, mild LVH, mild panvalvular regurgitation.   Gastritis    GERD (gastroesophageal reflux disease)    Hepatic steatosis    History of 2019 novel coronavirus disease (COVID-19) 01/11/2022   History of hiatal hernia    a.) s/p repair 01/2022   Hyperlipemia    Hyperplastic colon polyp    Hypertension    Hypoglycemia 05/2022   this is something new-seeing pcp for this currently   Pneumonia    PONV (postoperative nausea and vomiting)    a.) PONV following orthopedic surgery (hip) in 2022   Prediabetes    PSVT (paroxysmal supraventricular tachycardia)    Redundant colon    Tubular adenoma of colon    Umbilical hernia    Past Surgical History:  Procedure Laterality Date   [place]   COLONOSCOPY WITH PROPOFOL N/A 12/12/2016   Procedure: COLONOSCOPY WITH PROPOFOL;  Surgeon: Lollie Sails, MD;  Location: [facility] ENDOSCOPY;  Service: Endoscopy;  Laterality: N/A;   COLONOSCOPY WITH PROPOFOL N/A 07/14/2019   Procedure: COLONOSCOPY WITH PROPOFOL;  Surgeon: Toledo, Benay Pike, MD;  Location: [facility] ENDOSCOPY;  Service: Gastroenterology;  Laterality: N/A;   ESOPHAGOGASTRODUODENOSCOPY N/A 12/27/2021   Procedure: ESOPHAGOGASTRODUODENOSCOPY (EGD);  Surgeon: Benjamine Sprague, DO;  Location: [facility] ENDOSCOPY;  Service: General;  Laterality: N/A;   ESOPHAGOGASTRODUODENOSCOPY (EGD) WITH PROPOFOL N/A 12/12/2016   Procedure: ESOPHAGOGASTRODUODENOSCOPY (EGD) WITH PROPOFOL;  Surgeon: Lollie Sails, MD;  Location: [facility] ENDOSCOPY;   Service: Endoscopy;  Laterality: N/A;   ESOPHAGOGASTRODUODENOSCOPY (EGD) WITH PROPOFOL N/A 07/14/2019   Procedure: ESOPHAGOGASTRODUODENOSCOPY (EGD) WITH PROPOFOL;  Surgeon: Toledo, Benay Pike, MD;  Location: [facility] ENDOSCOPY;  Service: Gastroenterology;  Laterality: N/A;   INSERTION OF MESH  02/05/2022   Procedure: INSERTION OF MESH;  Surgeon: Amanda Husbands, MD;  Location: [facility] ORS;  Service: General;;   [place]   right   LEFT HEART CATH AND CORONARY ANGIOGRAPHY Left 09/25/2001   Procedure: LEFT HEART CATH AND CORONARY ANGIOGRAPHY; Location: [place]; Surgeon: Katrine Coho, MD   LEFT HEART CATH AND CORONARY ANGIOGRAPHY Left 06/09/2003   Procedure: LEFT HEART CATH AND CORONARY ANGIOGRAPHY; Location: [place]; Surgeon: Katrine Coho, MD   ORIF ANKLE FRACTURE Right    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    TONSILLECTOMY  1948   TUBAL LIGATION  [id number]   UMBILICAL HERNIA REPAIR N/A 02/05/2022   Procedure: HERNIA REPAIR UMBILICAL ADULT;  Surgeon: Amanda Husbands, MD;  Location: [facility] ORS;  Service: General;  Laterality: N/A;   VAGINAL HYSTERECTOMY  1985   d/t heavy bleeding   XI ROBOTIC ASSISTED PARAESOPHAGEAL HERNIA REPAIR N/A 02/05/2022   Procedure: XI ROBOTIC ASSISTED PARAESOPHAGEAL HERNIA REPAIR, RNFA to assist;  Surgeon: Amanda Husbands, MD;  Location: [facility] ORS;  Service: General;  Laterality: N/A;   Family History  Problem Relation Age of  Onset   Heart failure Mother    Stroke Mother    Breast cancer Mother        dx at age 37 yo   Diabetes Mother    Osteoporosis Mother    Emphysema Father    Ovarian cancer Sister        dx at age 48 yo   Asthma Sister    Diabetes Daughter    Heart disease Daughter    COPD Daughter    Social History   Tobacco Use   Smoking status: Former    Packs/day: 0.50    Years: 5.00    Additional pack years: 0.00    Total pack years: 2.50    Types: Cigarettes    Quit date: 1980    Years since  quitting: 44.2    Passive exposure: Past   Smokeless tobacco: Never  Vaping Use   Vaping Use: Never used  Substance Use Topics   Alcohol use: No    Alcohol/week: 0.0 standard drinks of alcohol   Drug use: Never    Pertinent Clinical Results:  LABS:   Lab Results  Component Value Date   WBC 6.8 06/03/2022   HGB 13.0 06/03/2022   HCT 40.5 06/03/2022   MCV 83.9 06/03/2022   PLT 278 06/03/2022   Lab Results  Component Value Date   NA 140 06/03/2022   K 3.8 06/03/2022   CO2 26 06/03/2022   GLUCOSE 109 (H) 06/03/2022   BUN 14 06/03/2022   CREATININE 0.89 06/03/2022   CALCIUM 9.2 06/03/2022   GFRNONAA >60 06/03/2022   Lab Results  Component Value Date   HGBA1C 5.7 (A) 06/10/2022    ECG: Date: 02/08/2022 Time ECG obtained: 1432 PM Rate: 83 bpm Rhythm: normal sinus Axis (leads I and aVF): Normal Intervals: PR 180 ms. QRS 78 ms. QTc 470 ms. ST segment and T wave changes: No evidence of acute ST segment elevation or depression Comparison: Similar to previous tracing obtained on 11/11/2021   IMAGING / PROCEDURES: CT ABDOMEN PELVIS W CONTRAST performed on 06/04/2022 Interval surgical repair of hiatal hernia Sigmoid diverticulosis without evidence of diverticulitis Small LEFT pleural effusion and minimal LEFT basilar atelectasis Small posterior RIGHT diaphragmatic hernia containing fat No acute intra-abdominal or intrapelvic abnormalities Aortic atherosclerosis  DG CHEST 2 VIEW performed on 05/29/2022 lungs are clear.  Trace LEFT pleural effusion which has decreased from prior No focal lung consolidation or pneumothorax RIGHT pleural effusion has resolved Cardiomediastinal silhouette is within normal limits No acute fractures are seen   MYOCARDIAL PERFUSION IMAGING STUDY (LEXISCAN) performed on 03/30/2020 Normal left ventricular systolic function with a hyperdynamic LVEF of 69% Normal myocardial thickening and wall motion Left ventricular cavity size  normal SPECT images demonstrate homogenous tracer distribution throughout the myocardium No evidence of stress-induced myocardial ischemia or arrhythmia Normal low risk study   TRANSTHORACIC ECHOCARDIOGRAM performed on 03/30/2020 Normal left ventricular systolic function with an EF of >55% Mild LVH Normal left ventricular diastolic Doppler parameters Normal right ventricular systolic function Mild panvalvular regurgitation Normal transvalvular gradients; no valvular stenosis No pericardial effusion  Impression and Plan:  Amanda Duke has been referred for pre-anesthesia review and clearance prior to her undergoing the planned anesthetic and procedural courses. Available labs, pertinent testing, and imaging results were personally reviewed by me in preparation for upcoming operative/procedural course. [facility] medical record has been updated following extensive record review and patient interview with PAT staff.   This patient has been appropriately cleared by cardiology  with an overall LOW risk of significant perioperative cardiovascular complications. Based on clinical review performed today (07/11/22), barring any significant acute changes in the patient's overall condition, it is anticipated that she will be able to proceed with the planned surgical intervention. Any acute changes in clinical condition may necessitate her procedure being postponed and/or cancelled. Patient will meet with anesthesia team (MD and/or CRNA) on the day of her procedure for preoperative evaluation/assessment. Questions regarding anesthetic course will be fielded at that time.   Pre-surgical instructions were reviewed with the patient during her PAT appointment, and questions were fielded to satisfaction by PAT clinical staff. She has been instructed on which medications that she will need to hold prior to surgery, as well as the ones that have been deemed safe/appropriate to take of the day of her procedure.  As part of the general education provided by PAT, patient made aware both verbally and in writing, that she would need to abstain from the use of any illegal substances during her perioperative course.  She was advised that failure to follow the provided instructions could necessitate case cancellation or result serious perioperative complications up to and including death. Patient encouraged to contact PAT and/or her surgeon's office to discuss any questions or concerns that may arise prior to surgery; verbalized understanding.   Honor Loh, MSN, APRN, FNP-C, CEN [facility]  Peri-operative Services Nurse Practitioner Phone: [phone] Fax: [phone] 07/11/22 11:42 AM  NOTE: This note has been prepared using Dragon dictation software. Despite my best ability to proofread, there is always the potential that unintentional transcriptional errors may still occur from this process.

## 2022-07-11 NOTE — Progress Notes (Signed)
[facility] [place], [place] [address]  Internal MEDICINE  Office Visit Note  Patient Name: Amanda Duke  [id number]  [id number]  Date of Service: 07/19/2022  Chief Complaint  Patient presents with   Follow-up   Gastroesophageal Reflux   Hypertension   Hyperlipidemia   Quality Metric Gaps    Shingles Vaccines    HPI Pt is here for routine follow up -She was seen in office last month after hypoglycemic episode sent her to the ED -Her A1c was checked in office and found to be prediabetic at 5.7 and was given glucose monitor -Lowest sugar since hospital was 55 and took sugar water and it fixed it -The other day went out to eat at buffet and felt sick after and sugar was elevated over 200. She realized that she cannot eat large quantities quickly as this spiked her sugar and is now avoiding this. Sticking with small, frequent meals -Fasting avg 107 -Surgeon saw her Monday and thinks nausea is from [place] and is going to be having surgery next week for this  Current Medication: Outpatient Encounter Medications as of 07/11/2022  Medication Sig   acetaminophen (TYLENOL) 500 MG tablet Take 1,000 mg by mouth every 6 (six) hours as needed for moderate pain.   aspirin EC 81 MG tablet Take 81 mg by mouth daily. Swallow whole.   cholecalciferol (VITAMIN D) 1000 units tablet Take 1,000 Units by mouth daily.   diltiazem (CARDIZEM CD) 180 MG 24 hr capsule TAKE 1 CAPSULE BY MOUTH EVERY DAY (Patient taking differently: Take 180 mg by mouth every morning.)   fluticasone (FLONASE) 50 MCG/ACT nasal spray Place 1 spray into both nostrils daily. (Patient taking differently: Place 1 spray into both nostrils as needed.)   icosapent Ethyl (VASCEPA) 1 g capsule Take 2 g by mouth 2 (two) times daily.   linaclotide (LINZESS) 290 MCG CAPS capsule Take 1 capsule (290 mcg total) by mouth daily. (Patient taking differently: Take 290 mcg by mouth daily before breakfast.)   losartan  (COZAAR) 100 MG tablet Take 100 mg by mouth every morning.   ondansetron (ZOFRAN-ODT) 4 MG disintegrating tablet Take 1 tablet (4 mg total) by mouth every 8 (eight) hours as needed. (Patient taking differently: Take 4 mg by mouth every 8 (eight) hours as needed for nausea or vomiting.)   pantoprazole (PROTONIX) 40 MG tablet Take 40 mg by mouth as needed.   rosuvastatin (CRESTOR) 40 MG tablet Take 40 mg by mouth at bedtime.   solifenacin (VESICARE) 5 MG tablet TAKE 1 TABLET (5 MG TOTAL) BY MOUTH DAILY. (Patient taking differently: Take 5 mg by mouth as needed.)   [DISCONTINUED] clonazePAM (KLONOPIN) 0.5 MG tablet Take 1 tablet by mouth as needed (Patient taking differently: Take 0.5 mg by mouth as needed for anxiety. Take 1 tablet by mouth as needed)   carvedilol (COREG) 3.125 MG tablet Take 3.125 mg by mouth 2 (two) times daily with a meal.    clonazePAM (KLONOPIN) 0.5 MG tablet Take 1 tablet (0.5 mg total) by mouth as needed for anxiety. Take 1 tablet by mouth as needed   No facility-administered encounter medications on file as of 07/11/2022.    Surgical History: Past Surgical History:  Procedure Laterality Date   [place]   COLONOSCOPY WITH PROPOFOL N/A 12/12/2016   Procedure: COLONOSCOPY WITH PROPOFOL;  Surgeon: Lollie Sails, MD;  Location: [facility] ENDOSCOPY;  Service:  Endoscopy;  Laterality: N/A;   COLONOSCOPY WITH PROPOFOL N/A 07/14/2019   Procedure: COLONOSCOPY WITH PROPOFOL;  Surgeon: Toledo, Benay Pike, MD;  Location: [facility] ENDOSCOPY;  Service: Gastroenterology;  Laterality: N/A;   ESOPHAGOGASTRODUODENOSCOPY N/A 12/27/2021   Procedure: ESOPHAGOGASTRODUODENOSCOPY (EGD);  Surgeon: Benjamine Sprague, DO;  Location: [facility] ENDOSCOPY;  Service: General;  Laterality: N/A;   ESOPHAGOGASTRODUODENOSCOPY (EGD) WITH PROPOFOL N/A 12/12/2016   Procedure: ESOPHAGOGASTRODUODENOSCOPY (EGD) WITH PROPOFOL;  Surgeon: Lollie Sails, MD;  Location[facility]  ENDOSCOPY;  Service: Endoscopy;  Laterality: N/A;   ESOPHAGOGASTRODUODENOSCOPY (EGD) WITH PROPOFOL N/A 07/14/2019   Procedure: ESOPHAGOGASTRODUODENOSCOPY (EGD) WITH PROPOFOL;  Surgeon: Toledo, Benay Pike, MD;  Location: [facility] ENDOSCOPY;  Service: Gastroenterology;  Laterality: N/A;   INSERTION OF MESH  02/05/2022   Procedure: INSERTION OF MESH;  Surgeon: Jules Husbands, MD;  Location: [facility] ORS;  Service: General;;   [place]   right   LEFT HEART CATH AND CORONARY ANGIOGRAPHY Left 09/25/2001   Procedure: LEFT HEART CATH AND CORONARY ANGIOGRAPHY; Location: [facility]; Surgeon: Katrine Coho, MD   LEFT HEART CATH AND CORONARY ANGIOGRAPHY Left 06/09/2003   Procedure: LEFT HEART CATH AND CORONARY ANGIOGRAPHY; Location: [facility]; Surgeon: Katrine Coho, MD   ORIF ANKLE FRACTURE Right    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    TONSILLECTOMY  1948   TUBAL LIGATION  [id number]   UMBILICAL HERNIA REPAIR N/A 02/05/2022   Procedure: HERNIA REPAIR UMBILICAL ADULT;  Surgeon: Jules Husbands, MD;  Location: [facility] ORS;  Service: General;  Laterality: N/A;   VAGINAL HYSTERECTOMY  1985   d/t heavy bleeding   XI ROBOTIC ASSISTED PARAESOPHAGEAL HERNIA REPAIR N/A 02/05/2022   Procedure: XI ROBOTIC ASSISTED PARAESOPHAGEAL HERNIA REPAIR, RNFA to assist;  Surgeon: Jules Husbands, MD;  Location: [facility] ORS;  Service: General;  Laterality: N/A;    Medical History: Past Medical History:  Diagnosis Date   Anxiety    Aortic atherosclerosis ([place])    Arthritis    Bilateral renal cysts    Coronary artery disease 09/25/2001   a.) LHC 09/25/2001: EF 78%. 20% pLAD - med mgmt; b.) LHC 06/09/2003: EF 63%. 20% pLCx, 20% pLAD, 50% mLAD, 50% dLAD - med mgmt.   Diastolic dysfunction [id number]   a.) Stress echo 12/30/2017: ED >55%, triv PR, mild AR.MR, mod TR, G1DD; b.) TTE 03/30/2020: EF >55%, mild LVH, mild panvalvular regurgitation.   Gastritis    GERD (gastroesophageal reflux  disease)    Hepatic steatosis    History of 2019 novel coronavirus disease (COVID-19) 01/11/2022   History of hiatal hernia    a.) s/p repair 01/2022   Hyperlipemia    Hyperplastic colon polyp    Hypertension    Hypoglycemia 05/2022   this is something new-seeing pcp for this currently   Pneumonia    PONV (postoperative nausea and vomiting)    a.) PONV following orthopedic surgery (hip) in 2022   Prediabetes    PSVT (paroxysmal supraventricular tachycardia)    Redundant colon    Tubular adenoma of colon    Umbilical hernia     Family History: Family History  Problem Relation Age of Onset   Heart failure Mother    Stroke Mother    Breast cancer Mother        dx at age 24 yo   Diabetes Mother    Osteoporosis Mother    Emphysema Father    Ovarian cancer Sister        dx at age  63 yo   Asthma Sister    Diabetes Daughter    Heart disease Daughter    COPD Daughter     Social History   Socioeconomic History   Marital status: Widowed    Spouse name: Not on file   Number of children: Not on file   Years of education: Not on file   Highest education level: Not on file  Occupational History   Not on file  Tobacco Use   Smoking status: Former    Packs/day: 0.50    Years: 5.00    Additional pack years: 0.00    Total pack years: 2.50    Types: Cigarettes    Quit date: 40    Years since quitting: 44.2    Passive exposure: Past   Smokeless tobacco: Never  Vaping Use   Vaping Use: Never used  Substance and Sexual Activity   Alcohol use: No    Alcohol/week: 0.0 standard drinks of alcohol   Drug use: Never   Sexual activity: Not Currently  Other Topics Concern   Not on file  Social History Narrative   Not on file   Social Determinants of Health   Financial Resource Strain: Low Risk  (11/17/2020)   Overall Financial Resource Strain (CARDIA)    Difficulty of Paying Living Expenses: Not very hard  Food Insecurity: No Food Insecurity (02/05/2022)   Hunger Vital  Sign    Worried About Running Out of Food in the Last Year: Never true    [place] in the Last Year: Never true  Transportation Needs: No Transportation Needs (02/05/2022)   PRAPARE - Hydrologist (Medical): No    Lack of Transportation (Non-Medical): No  Physical Activity: Inactive (01/10/2019)   Exercise Vital Sign    Days of Exercise per Week: 0 days    Minutes of Exercise per Session: 0 min  Stress: No Stress Concern Present (01/10/2019)   [place]    Feeling of Stress : Not at all  Social Connections: Socially Isolated (01/10/2019)   Social Connection and Isolation Panel [NHANES]    Frequency of Communication with Friends and Family: Three times a week    Frequency of Social Gatherings with Friends and Family: Three times a week    Attends Religious Services: Never    Active Member of Clubs or Organizations: No    Attends Archivist Meetings: Never    Marital Status: Widowed  Intimate Partner Violence: Not At Risk (02/05/2022)   Humiliation, Afraid, Rape, and Kick questionnaire    Fear of Current or Ex-Partner: No    Emotionally Abused: No    Physically Abused: No    Sexually Abused: No      Review of Systems  Constitutional:  Negative for chills, fatigue and unexpected weight change.  HENT:  Negative for congestion, dental problem, postnasal drip, rhinorrhea, sneezing and sore throat.   Eyes:  Negative for redness.  Respiratory: Negative.  Negative for cough, chest tightness, shortness of breath and wheezing.   Cardiovascular: Negative.  Negative for chest pain and palpitations.  Gastrointestinal:  Negative for abdominal pain, constipation, diarrhea, nausea and vomiting.  Genitourinary:  Negative for dysuria and frequency.  Musculoskeletal:  Negative for arthralgias, back pain, joint swelling and neck pain.  Skin:  Negative for rash.  Neurological: Negative.   Negative for tremors and numbness.  Hematological:  Negative for adenopathy. Does not bruise/bleed easily.  Psychiatric/Behavioral:  Negative for behavioral problems (Depression), sleep disturbance and suicidal ideas. The patient is not nervous/anxious.     Vital Signs: BP 138/78   Pulse 77   Temp 97.6 F (36.4 C)   Resp 16   Ht 5\' 4"  (1.626 m)   Wt 158 lb (71.7 kg)   SpO2 97%   BMI 27.12 kg/m    Physical Exam Vitals and nursing note reviewed.  Constitutional:      General: She is not in acute distress.    Appearance: Normal appearance. She is well-developed. She is not diaphoretic.  HENT:     Head: Normocephalic and atraumatic.  Eyes:     Pupils: Pupils are equal, round, and reactive to light.  Neck:     Thyroid: No thyromegaly.     Vascular: No JVD.     Trachea: No tracheal deviation.  Cardiovascular:     Rate and Rhythm: Normal rate and regular rhythm.     Heart sounds: Normal heart sounds. No murmur heard.    No friction rub. No gallop.  Pulmonary:     Effort: Pulmonary effort is normal. No respiratory distress.     Breath sounds: No wheezing or rales.  Chest:     Chest wall: No tenderness.  Neurological:     Mental Status: She is alert and oriented to person, place, and time.     Cranial Nerves: No cranial nerve deficit.  Psychiatric:        Mood and Affect: Mood normal.        Behavior: Behavior normal.        Assessment/Plan: 1. Prediabetes Continue to monitor sugars and work on diet and exercise. Eat small meals at regular intervals  2. Generalized anxiety disorder May continue klonopin prn - clonazePAM (KLONOPIN) 0.5 MG tablet; Take 1 tablet (0.5 mg total) by mouth as needed for anxiety. Take 1 tablet by mouth as needed  Dispense: 30 tablet; Refill: 1  3. Benign essential HTN Stable, continue current medications   General Counseling: Ryonna verbalizes understanding of the findings of todays visit and agrees with plan of treatment. I have  discussed any further diagnostic evaluation that may be needed or ordered today. We also reviewed her medications today. she has been encouraged to call the office with any questions or concerns that should arise related to todays visit.    No orders of the defined types were placed in this encounter.   Meds ordered this encounter  Medications   clonazePAM (KLONOPIN) 0.5 MG tablet    Sig: Take 1 tablet (0.5 mg total) by mouth as needed for anxiety. Take 1 tablet by mouth as needed    Dispense:  30 tablet    Refill:  1    Not to exceed 4 additional fills before 04/04/2021    This patient was seen by Drema Dallas, PA-C in collaboration with Dr. Clayborn Bigness as a part of collaborative care agreement.   Total time spent:30 Minutes Time spent includes review of chart, medications, test results, and follow up plan with the patient.      Dr Lavera Guise Internal medicine

## 2022-07-18 ENCOUNTER — Other Ambulatory Visit: Payer: Self-pay

## 2022-07-18 ENCOUNTER — Ambulatory Visit
Admission: RE | Admit: 2022-07-18 | Discharge: 2022-07-18 | Disposition: A | Discharge status: Home / Self Care | Payer: 59 | Source: Ambulatory Visit | Attending: Surgery | Admitting: Surgery

## 2022-07-18 ENCOUNTER — Encounter: Payer: Self-pay | Admitting: Surgery

## 2022-07-18 ENCOUNTER — Ambulatory Visit: Payer: 59 | Admitting: Urgent Care

## 2022-07-18 ENCOUNTER — Encounter
Admission: RE | Disposition: A | Discharge status: Home / Self Care | Payer: Self-pay | Source: Ambulatory Visit | Attending: Surgery

## 2022-07-18 DIAGNOSIS — E785 Hyperlipidemia, unspecified: Secondary | ICD-10-CM | POA: Diagnosis not present

## 2022-07-18 DIAGNOSIS — I5032 Chronic diastolic (congestive) heart failure: Secondary | ICD-10-CM | POA: Diagnosis not present

## 2022-07-18 DIAGNOSIS — K8050 Calculus of bile duct without cholangitis or cholecystitis without obstruction: Secondary | ICD-10-CM | POA: Diagnosis not present

## 2022-07-18 DIAGNOSIS — R7303 Prediabetes: Secondary | ICD-10-CM | POA: Insufficient documentation

## 2022-07-18 DIAGNOSIS — I471 Supraventricular tachycardia, unspecified: Secondary | ICD-10-CM | POA: Insufficient documentation

## 2022-07-18 DIAGNOSIS — I2510 Atherosclerotic heart disease of native coronary artery without angina pectoris: Secondary | ICD-10-CM | POA: Insufficient documentation

## 2022-07-18 DIAGNOSIS — Z87891 Personal history of nicotine dependence: Secondary | ICD-10-CM | POA: Diagnosis not present

## 2022-07-18 DIAGNOSIS — N182 Chronic kidney disease, stage 2 (mild): Secondary | ICD-10-CM | POA: Diagnosis not present

## 2022-07-18 DIAGNOSIS — K219 Gastro-esophageal reflux disease without esophagitis: Secondary | ICD-10-CM | POA: Diagnosis not present

## 2022-07-18 DIAGNOSIS — K8010 Calculus of gallbladder with chronic cholecystitis without obstruction: Secondary | ICD-10-CM | POA: Insufficient documentation

## 2022-07-18 DIAGNOSIS — I129 Hypertensive chronic kidney disease with stage 1 through stage 4 chronic kidney disease, or unspecified chronic kidney disease: Secondary | ICD-10-CM | POA: Diagnosis not present

## 2022-07-18 DIAGNOSIS — F419 Anxiety disorder, unspecified: Secondary | ICD-10-CM | POA: Diagnosis not present

## 2022-07-18 DIAGNOSIS — K8020 Calculus of gallbladder without cholecystitis without obstruction: Secondary | ICD-10-CM

## 2022-07-18 DIAGNOSIS — Z01818 Encounter for other preprocedural examination: Secondary | ICD-10-CM

## 2022-07-18 DIAGNOSIS — I110 Hypertensive heart disease with heart failure: Secondary | ICD-10-CM | POA: Insufficient documentation

## 2022-07-18 HISTORY — DX: Supraventricular tachycardia, unspecified: I47.10

## 2022-07-18 LAB — GLUCOSE, CAPILLARY
Glucose-Capillary: 116 mg/dL — ABNORMAL HIGH (ref 70–99)
Glucose-Capillary: 133 mg/dL — ABNORMAL HIGH (ref 70–99)

## 2022-07-18 SURGERY — CHOLECYSTECTOMY, ROBOT-ASSISTED, LAPAROSCOPIC
Anesthesia: General

## 2022-07-18 MED ORDER — CEFAZOLIN SODIUM-DEXTROSE 2-4 GM/100ML-% IV SOLN: 2.0000 g | INTRAVENOUS | Status: DC

## 2022-07-18 MED ORDER — PROPOFOL 10 MG/ML IV BOLUS
INTRAVENOUS | Status: DC | PRN
  Administered 2022-07-18: 110 mg via INTRAVENOUS

## 2022-07-18 MED ORDER — OXYCODONE HCL 5 MG PO TABS
5.0000 mg | ORAL_TABLET | ORAL | Status: AC | PRN
  Administered 2022-07-18: 5 mg via ORAL

## 2022-07-18 MED ORDER — DEXAMETHASONE SODIUM PHOSPHATE 10 MG/ML IJ SOLN
INTRAMUSCULAR | Status: DC | PRN
  Administered 2022-07-18: 10 mg via INTRAVENOUS

## 2022-07-18 MED ORDER — GABAPENTIN 300 MG PO CAPS: 300.0000 mg | ORAL_CAPSULE | ORAL | Status: AC

## 2022-07-18 MED ORDER — EPHEDRINE 5 MG/ML INJ
INTRAVENOUS | Status: AC
  Filled 2022-07-18: qty 5

## 2022-07-18 MED ORDER — BUPIVACAINE LIPOSOME 1.3 % IJ SUSP
INTRAMUSCULAR | Status: AC
  Filled 2022-07-18: qty 20

## 2022-07-18 MED ORDER — HYDROCODONE-ACETAMINOPHEN 5-325 MG PO TABS: 1.0000 | ORAL_TABLET | Freq: Four times a day (QID) | ORAL | 0 refills | Status: DC | PRN

## 2022-07-18 MED ORDER — CHLORHEXIDINE GLUCONATE CLOTH 2 % EX PADS: 6.0000 | MEDICATED_PAD | Freq: Once | CUTANEOUS | Status: DC

## 2022-07-18 MED ORDER — ONDANSETRON HCL 4 MG/2ML IJ SOLN
INTRAMUSCULAR | Status: AC
  Filled 2022-07-18: qty 2

## 2022-07-18 MED ORDER — ORAL CARE MOUTH RINSE: 15.0000 mL | Freq: Once | OROMUCOSAL | Status: DC

## 2022-07-18 MED ORDER — FENTANYL CITRATE (PF) 100 MCG/2ML IJ SOLN
INTRAMUSCULAR | Status: DC | PRN
  Administered 2022-07-18 (×2): 25 ug via INTRAVENOUS

## 2022-07-18 MED ORDER — OXYCODONE HCL 5 MG PO TABS
ORAL_TABLET | ORAL | Status: AC
  Filled 2022-07-18: qty 1

## 2022-07-18 MED ORDER — CHLORHEXIDINE GLUCONATE 0.12 % MT SOLN
OROMUCOSAL | Status: AC
  Filled 2022-07-18: qty 15

## 2022-07-18 MED ORDER — 0.9 % SODIUM CHLORIDE (POUR BTL) OPTIME
TOPICAL | Status: DC | PRN
  Administered 2022-07-18: 500 mL

## 2022-07-18 MED ORDER — FENTANYL CITRATE (PF) 100 MCG/2ML IJ SOLN
INTRAMUSCULAR | Status: AC
  Filled 2022-07-18: qty 2

## 2022-07-18 MED ORDER — ROCURONIUM BROMIDE 10 MG/ML (PF) SYRINGE
PREFILLED_SYRINGE | INTRAVENOUS | Status: AC
  Filled 2022-07-18: qty 10

## 2022-07-18 MED ORDER — GABAPENTIN 300 MG PO CAPS
ORAL_CAPSULE | ORAL | Status: AC
  Administered 2022-07-18: 300 mg via ORAL
  Filled 2022-07-18: qty 1

## 2022-07-18 MED ORDER — LACTATED RINGERS IV SOLN: INTRAVENOUS | Status: DC

## 2022-07-18 MED ORDER — LIDOCAINE HCL (PF) 2 % IJ SOLN
INTRAMUSCULAR | Status: AC
  Filled 2022-07-18: qty 5

## 2022-07-18 MED ORDER — BUPIVACAINE LIPOSOME 1.3 % IJ SUSP
INTRAMUSCULAR | Status: DC | PRN
  Administered 2022-07-18: 50 mL

## 2022-07-18 MED ORDER — ACETAMINOPHEN 500 MG PO TABS
ORAL_TABLET | ORAL | Status: AC
  Administered 2022-07-18: 1000 mg via ORAL
  Filled 2022-07-18: qty 2

## 2022-07-18 MED ORDER — ONDANSETRON HCL 4 MG/2ML IJ SOLN: 4.0000 mg | Freq: Once | INTRAMUSCULAR | Status: DC | PRN

## 2022-07-18 MED ORDER — ACETAMINOPHEN 500 MG PO TABS: 1000.0000 mg | ORAL_TABLET | ORAL | Status: AC

## 2022-07-18 MED ORDER — BUPIVACAINE HCL (PF) 0.25 % IJ SOLN
INTRAMUSCULAR | Status: AC
  Filled 2022-07-18: qty 30

## 2022-07-18 MED ORDER — DEXAMETHASONE SODIUM PHOSPHATE 10 MG/ML IJ SOLN
INTRAMUSCULAR | Status: AC
  Filled 2022-07-18: qty 1

## 2022-07-18 MED ORDER — ROCURONIUM BROMIDE 100 MG/10ML IV SOLN
INTRAVENOUS | Status: DC | PRN
  Administered 2022-07-18: 40 mg via INTRAVENOUS
  Administered 2022-07-18: 20 mg via INTRAVENOUS

## 2022-07-18 MED ORDER — LIDOCAINE HCL (CARDIAC) PF 100 MG/5ML IV SOSY
PREFILLED_SYRINGE | INTRAVENOUS | Status: DC | PRN
  Administered 2022-07-18: 80 mg via INTRAVENOUS

## 2022-07-18 MED ORDER — CEFAZOLIN SODIUM-DEXTROSE 2-4 GM/100ML-% IV SOLN
INTRAVENOUS | Status: AC
  Filled 2022-07-18: qty 100

## 2022-07-18 MED ORDER — ONDANSETRON HCL 4 MG/2ML IJ SOLN
INTRAMUSCULAR | Status: DC | PRN
  Administered 2022-07-18: 4 mg via INTRAVENOUS

## 2022-07-18 MED ORDER — FENTANYL CITRATE (PF) 100 MCG/2ML IJ SOLN
25.0000 ug | INTRAMUSCULAR | Status: DC | PRN
  Administered 2022-07-18 (×4): 25 ug via INTRAVENOUS

## 2022-07-18 MED ORDER — INDOCYANINE GREEN 25 MG IV SOLR
2.5000 mg | Freq: Once | INTRAVENOUS | Status: AC
  Administered 2022-07-18: 2.5 mg via INTRAVENOUS
  Filled 2022-07-18: qty 1

## 2022-07-18 MED ORDER — EPHEDRINE SULFATE (PRESSORS) 50 MG/ML IJ SOLN
INTRAMUSCULAR | Status: DC | PRN
  Administered 2022-07-18 (×2): 10 mg via INTRAVENOUS
  Administered 2022-07-18: 5 mg via INTRAVENOUS
  Administered 2022-07-18: 10 mg via INTRAVENOUS

## 2022-07-18 MED ORDER — SUGAMMADEX SODIUM 200 MG/2ML IV SOLN
INTRAVENOUS | Status: DC | PRN
  Administered 2022-07-18: 143.4 mg via INTRAVENOUS

## 2022-07-18 SURGICAL SUPPLY — 51 items
ADH SKN CLS APL DERMABOND .7 (GAUZE/BANDAGES/DRESSINGS) ×1
CANNULA REDUC XI 12-8 STAPL (CANNULA) ×1
CANNULA REDUCER 12-8 DVNC XI (CANNULA) ×1 IMPLANT
CATH REDDICK CHOLANGI 4FR 50CM (CATHETERS) IMPLANT
CLIP LIGATING HEMO O LOK GREEN (MISCELLANEOUS) ×1 IMPLANT
DERMABOND ADVANCED .7 DNX12 (GAUZE/BANDAGES/DRESSINGS) ×1 IMPLANT
DRAPE ARM DVNC X/XI (DISPOSABLE) ×4 IMPLANT
DRAPE COLUMN DVNC XI (DISPOSABLE) ×1 IMPLANT
DRAPE DA VINCI XI ARM (DISPOSABLE) ×4
DRAPE DA VINCI XI COLUMN (DISPOSABLE) ×1
ELECT CAUTERY BLADE 6.4 (BLADE) ×1 IMPLANT
ELECT REM PT RETURN 9FT ADLT (ELECTROSURGICAL) ×1
ELECTRODE REM PT RTRN 9FT ADLT (ELECTROSURGICAL) ×1 IMPLANT
GLOVE BIO SURGEON STRL SZ7 (GLOVE) ×2 IMPLANT
GOWN STRL REUS W/ TWL LRG LVL3 (GOWN DISPOSABLE) ×4 IMPLANT
GOWN STRL REUS W/TWL LRG LVL3 (GOWN DISPOSABLE) ×3
IRRIGATION STRYKERFLOW (MISCELLANEOUS) IMPLANT
IRRIGATOR STRYKERFLOW (MISCELLANEOUS)
IV CATH ANGIO 12GX3 LT BLUE (NEEDLE) IMPLANT
KIT PINK PAD W/HEAD ARE REST (MISCELLANEOUS) ×1
KIT PINK PAD W/HEAD ARM REST (MISCELLANEOUS) ×1 IMPLANT
LABEL OR SOLS (LABEL) ×1 IMPLANT
MANIFOLD NEPTUNE II (INSTRUMENTS) ×1 IMPLANT
NDL HYPO 22X1.5 SAFETY MO (MISCELLANEOUS) ×1 IMPLANT
NEEDLE HYPO 22X1.5 SAFETY MO (MISCELLANEOUS) ×1 IMPLANT
NS IRRIG 500ML POUR BTL (IV SOLUTION) ×1 IMPLANT
OBTURATOR OPTICAL STANDARD 8MM (TROCAR) ×1
OBTURATOR OPTICAL STND 8 DVNC (TROCAR) ×1
OBTURATOR OPTICALSTD 8 DVNC (TROCAR) ×1 IMPLANT
PACK LAP CHOLECYSTECTOMY (MISCELLANEOUS) ×1 IMPLANT
PENCIL SMOKE EVACUATOR (MISCELLANEOUS) ×1 IMPLANT
SEAL CANN UNIV 5-8 DVNC XI (MISCELLANEOUS) ×3 IMPLANT
SEAL XI 5MM-8MM UNIVERSAL (MISCELLANEOUS) ×3
SET TUBE SMOKE EVAC HIGH FLOW (TUBING) ×1 IMPLANT
SOL ELECTROSURG ANTI STICK (MISCELLANEOUS) ×1
SOLUTION ELECTROSURG ANTI STCK (MISCELLANEOUS) ×1 IMPLANT
SPIKE FLUID TRANSFER (MISCELLANEOUS) ×1 IMPLANT
SPONGE T-LAP 18X18 ~~LOC~~+RFID (SPONGE) ×1 IMPLANT
SPONGE T-LAP 4X18 ~~LOC~~+RFID (SPONGE) IMPLANT
STAPLER CANNULA SEAL DVNC XI (STAPLE) ×1 IMPLANT
STAPLER CANNULA SEAL XI (STAPLE) ×1
STOPCOCK 3 WAY MALE LL (IV SETS)
STOPCOCK 3WAY MALE LL (IV SETS) IMPLANT
SUT MNCRL AB 4-0 PS2 18 (SUTURE) ×1 IMPLANT
SUT VICRYL 0 UR6 27IN ABS (SUTURE) ×2 IMPLANT
SYR 30ML LL (SYRINGE) ×1 IMPLANT
SYS BAG RETRIEVAL 10MM (BASKET) ×1
SYSTEM BAG RETRIEVAL 10MM (BASKET) ×1 IMPLANT
TRAP FLUID SMOKE EVACUATOR (MISCELLANEOUS) ×1 IMPLANT
WATER STERILE IRR 3000ML UROMA (IV SOLUTION) IMPLANT
WATER STERILE IRR 500ML POUR (IV SOLUTION) ×1 IMPLANT

## 2022-07-18 NOTE — Discharge Instructions (Addendum)
Laparoscopic Cholecystectomy, Care After  ° °These instructions give you information on caring for yourself after your procedure. Your doctor may also give you more specific instructions. Call your doctor if you have any problems or questions after your procedure.  °HOME CARE  °Change your bandages (dressings) as told by your doctor.  °Keep the wound dry and clean. Wash the wound gently with soap and water. Pat the wound dry with a clean towel.  °Do not take baths, swim, or use hot tubs for 2 weeks, or as told by your doctor.  °Only take medicine as told by your doctor.  °Eat a normal diet as told by your doctor.  °Do not lift anything heavier than 10 pounds (4.5 kg) until your doctor says it is okay.  °Do not play contact sports for 1 week, or as told by your doctor. °GET HELP IF:  °Your wound is red, puffy (swollen), or painful.  °You have yellowish-white fluid (pus) coming from the wound.  °You have fluid draining from the wound for more than 1 day.  °You have a bad smell coming from the wound.  °Your wound breaks open. °GET HELP RIGHT AWAY IF:  °You have trouble breathing.  °You have chest pain.  °You have a fever >101  °You have pain in the shoulders (shoulder strap areas) that is getting worse.  °You feel dizzy or pass out (faint).  °You have severe belly (abdominal) pain.  °You feel sick to your stomach (nauseous) or throw up (vomit) for more than 1 day. ° °AMBULATORY SURGERY  °DISCHARGE INSTRUCTIONS ° ° °The drugs that you were given will stay in your system until tomorrow so for the next 24 hours you should not: ° °Drive an automobile °Make any legal decisions °Drink any alcoholic beverage ° ° °You may resume regular meals tomorrow.  Today it is better to start with liquids and gradually work up to solid foods. ° °You may eat anything you prefer, but it is better to start with liquids, then soup and crackers, and gradually work up to solid foods. ° ° °Please notify your doctor immediately if you have any  unusual bleeding, trouble breathing, redness and pain at the surgery site, drainage, fever, or pain not relieved by medication. ° ° ° °Additional Instructions: °Please contact your physician with any problems or Same Day Surgery at [phone], Monday through Friday 6 am to 4 pm, or [facility] Main number at [phone].  °  °

## 2022-07-18 NOTE — Interval H&P Note (Signed)
History and Physical Interval Note:  07/18/2022 10:48 AM  Amanda Duke  has presented today for surgery, with the diagnosis of biliary colic.  The various methods of treatment have been discussed with the patient and family. After consideration of risks, benefits and other options for treatment, the patient has consented to  Procedure(s): XI ROBOTIC ASSISTED LAPAROSCOPIC CHOLECYSTECTOMY (N/A) [place] (ICG) (N/A) as a surgical intervention.  The patient's history has been reviewed, patient examined, no change in status, stable for surgery.  I have reviewed the patient's chart and labs.  Questions were answered to the patient's satisfaction.     [place]

## 2022-07-18 NOTE — Anesthesia Procedure Notes (Signed)
Procedure Name: Intubation Date/Time: 07/18/2022 11:05 AM  Performed by: Demetrius Charity, CRNAPre-anesthesia Checklist: Patient identified, Patient being monitored, Timeout performed, Emergency Drugs available and Suction available Patient Re-evaluated:Patient Re-evaluated prior to induction Oxygen Delivery Method: Circle system utilized Preoxygenation: Pre-oxygenation with 100% oxygen Induction Type: IV induction Ventilation: Mask ventilation without difficulty and Oral airway inserted - appropriate to patient size Laryngoscope Size: 3 and McGraph Grade View: Grade I Tube type: Oral Tube size: 6.5 mm Number of attempts: 1 Airway Equipment and Method: Stylet and Video-laryngoscopy Placement Confirmation: ETT inserted through vocal cords under direct vision, positive ETCO2 and breath sounds checked- equal and bilateral Secured at: 21 cm Tube secured with: Tape Dental Injury: Teeth and Oropharynx as per pre-operative assessment

## 2022-07-18 NOTE — Transfer of Care (Signed)
Immediate Anesthesia Transfer of Care Note  Patient: Amanda Duke  Procedure(s) Performed: XI ROBOTIC ASSISTED LAPAROSCOPIC CHOLECYSTECTOMY INDOCYANINE GREEN FLUORESCENCE IMAGING (ICG)  Patient Location: PACU  Anesthesia Type:General  Level of Consciousness: awake and alert   Airway & Oxygen Therapy: Patient Spontanous Breathing and Patient connected to face mask oxygen  Post-op Assessment: Report given to RN and Post -op Vital signs reviewed and stable  Post vital signs: stable  Last Vitals:  Vitals Value Taken Time  BP 146/71 07/18/22 1201  Temp 36.3 C 07/18/22 1200  Pulse 61 07/18/22 1208  Resp 21 07/18/22 1208  SpO2 97 % 07/18/22 1208  Vitals shown include unvalidated device data.  Last Pain:  Vitals:   07/18/22 1200  TempSrc:   PainSc: Asleep         Complications: No notable events documented.

## 2022-07-18 NOTE — Op Note (Signed)
Robotic assisted laparoscopic Cholecystectomy  Pre-operative Diagnosis: biliary colic  Post-operative Diagnosis: same  Procedure:  Robotic assisted laparoscopic Cholecystectomy  Surgeon: Caroleen Hamman, MD FACS  Anesthesia: Gen. with endotracheal tube  Findings: Chronic mild Cholecystitis   Estimated Blood Loss: 5cc       Specimens: Gallbladder           Complications: none   Procedure Details  The patient was seen again in the Holding Room. The benefits, complications, treatment options, and expected outcomes were discussed with the patient. The risks of bleeding, infection, recurrence of symptoms, failure to resolve symptoms, bile duct damage, bile duct leak, retained common bile duct stone, bowel injury, any of which could require further surgery and/or ERCP, stent, or papillotomy were reviewed with the patient. The likelihood of improving the patient's symptoms with return to their baseline status is good.  The patient and/or family concurred with the proposed plan, giving informed consent.  The patient was taken to Operating Room, identified  and the procedure verified as Laparoscopic Cholecystectomy.  A Time Out was held and the above information confirmed.  Prior to the induction of general anesthesia, antibiotic prophylaxis was administered. VTE prophylaxis was in place. General endotracheal anesthesia was then administered and tolerated well. After the induction, the abdomen was prepped with Chloraprep and draped in the sterile fashion. The patient was positioned in the supine position.  Cut down technique was used to enter the abdominal cavity and a Hasson trochar was placed after two vicryl stitches were anchored to the fascia. Pneumoperitoneum was then created with CO2 and tolerated well without any adverse changes in the patient's vital signs.  Three 8-mm ports were placed under direct vision. All skin incisions  were infiltrated with a local anesthetic agent before making the  incision and placing the trocars.   The patient was positioned  in reverse Trendelenburg, robot was brought to the surgical field and docked in the standard fashion.  We made sure all the instrumentation was kept indirect view at all times and that there were no collision between the arms. I scrubbed out and went to the console. Some adhesions  from the omentum to the abdominal wall were taken down with cautery. No bowel injuries seen.  The gallbladder was identified, the fundus grasped and retracted cephalad. Adhesions were lysed bluntly. The infundibulum was grasped and retracted laterally, exposing the peritoneum overlying the triangle of [place]ot. This was then divided and exposed in a blunt fashion. An extended critical view of the cystic duct and cystic artery was obtained.  The cystic duct was clearly identified and bluntly dissected.   Artery and duct were double clipped and divided. Using ICG cholangiography we visualize the cystic duct and CBD w/o evidence of bile injuries. The gallbladder was taken from the gallbladder fossa in a retrograde fashion with the electrocautery.  Hemostasis was achieved with the electrocautery. nspection of the right upper quadrant was performed. No bleeding, bile duct injury or leak, or bowel injury was noted. Robotic instruments and robotic arms were undocked in the standard fashion.  I scrubbed back in.  The gallbladder was removed and placed in an Endocatch bag.   Pneumoperitoneum was released.  The periumbilical port site was closed with interrumpted 0 Vicryl sutures. 4-0 subcuticular Monocryl was used to close the skin. Dermabond was  applied.  The patient was then extubated and brought to the recovery room in stable condition. Sponge, lap, and needle counts were correct at closure and at the conclusion of the  case.               Caroleen Hamman, MD, FACS

## 2022-07-18 NOTE — Anesthesia Preprocedure Evaluation (Signed)
Anesthesia Evaluation  Patient identified by MRN, date of birth, ID band Patient awake    Reviewed: Allergy & Precautions, NPO status , Patient's Chart, lab work & pertinent test results  History of Anesthesia Complications (+) PONV and history of anesthetic complications  Airway Mallampati: IV  TM Distance: >3 FB Neck ROM: full    Dental  (+) Chipped, Missing, Poor Dentition   Pulmonary neg shortness of breath, neg sleep apnea, pneumonia, resolved, neg COPD, neg recent URI, former smoker   Pulmonary exam normal        Cardiovascular Exercise Tolerance: Good hypertension, + CAD  (-) Past MI Normal cardiovascular exam(-) dysrhythmias (-) Valvular Problems/Murmurs  Per cardiology, "this patient is optimized for surgery and may proceed with the planned procedural course with a LOW risk of significant perioperative cardiovascular complications".    Neuro/Psych  PSYCHIATRIC DISORDERS Anxiety     negative neurological ROS  negative psych ROS   GI/Hepatic Neg liver ROS, hiatal hernia,GERD  ,,  Endo/Other  negative endocrine ROS    Renal/GU Renal disease     Musculoskeletal  (+) Arthritis ,    Abdominal   Peds  Hematology negative hematology ROS (+)   Anesthesia Other Findings Past Medical History: No date: Anxiety No date: Aortic atherosclerosis (HCC) No date: Arthritis No date: Bilateral renal cysts 09/25/2001: Coronary artery disease     Comment:  a.) LHC 09/25/2001: EF 78%. 20% pLAD - med mgmt; b.) LHC              06/09/2003: EF 63%. 20% pLCx, 20% pLAD, 50% mLAD, 50%               dLAD - med mgmt. [id number]: Diastolic dysfunction     Comment:  a.) Stress echo 12/30/2017: ED >55%, triv PR, mild               AR.MR, mod TR, G1DD; b.) TTE 03/30/2020: EF >55%, mild               LVH, mild panvalvular regurgitation. No date: Gastritis No date: GERD (gastroesophageal reflux disease) No date: Hepatic  steatosis 01/11/2022: History of 2019 novel coronavirus disease (COVID-19) No date: History of hiatal hernia No date: Hyperlipemia No date: Hyperplastic colon polyp No date: Hypertension No date: Pneumonia No date: PONV (postoperative nausea and vomiting)     Comment:  a.) PONV following orthopedic surgery (hip) in 2022 No date: Prediabetes No date: Redundant colon No date: Tubular adenoma of colon No date: Umbilical hernia  Past Surgical History: 1971: APPENDECTOMY 1980: ARTHRODESIS 1980: BACK SURGERY 12/12/2016: COLONOSCOPY WITH PROPOFOL; N/A     Comment:  Procedure: COLONOSCOPY WITH PROPOFOL;  Surgeon:               Lollie Sails, MD;  Location: [facility] END[facility];                Service: Endoscopy;  Laterality: N/A; 07/14/2019: COLONOSCOPY WITH PROPOFOL; N/A     Comment:  Procedure: COLONOSCOPY WITH PROPOFOL;  Surgeon: Toledo,               Benay Pike, MD;  Location: [facility];  Service:               Gastroenterology;  Laterality: N/A; 12/27/2021: ESOPHAGOGASTRODUODENOSCOPY; N/A     Comment:  Procedure: ESOPHAGOGASTRODUODENOSCOPY (EGD);  Surgeon:               Benjamine Sprague, DO;  Location: [facility];  Service:  General;  Laterality: N/A; 12/12/2016: ESOPHAGOGASTRODUODENOSCOPY (EGD) WITH PROPOFOL; N/A     Comment:  Procedure: ESOPHAGOGASTRODUODENOSCOPY (EGD) WITH               PROPOFOL;  Surgeon: Lollie Sails, MD;  Location:               [facility];  Service: Endoscopy;  Laterality: N/A; 07/14/2019: ESOPHAGOGASTRODUODENOSCOPY (EGD) WITH PROPOFOL; N/A     Comment:  Procedure: ESOPHAGOGASTRODUODENOSCOPY (EGD) WITH               PROPOFOL;  Surgeon: Toledo, Benay Pike, MD;  Location:               [facility];  Service: Gastroenterology;  Laterality:               N/A; 1985: LAPAROSCOPIC UNILATERAL SALPINGO OOPHERECTOMY     Comment:  right 09/25/2001: LEFT HEART CATH AND CORONARY ANGIOGRAPHY; Left     Comment:  Procedure: LEFT HEART CATH  AND CORONARY ANGIOGRAPHY;               Location: [place]; Surgeon: Katrine Coho, MD 06/09/2003: LEFT HEART CATH AND CORONARY ANGIOGRAPHY; Left     Comment:  Procedure: LEFT HEART CATH AND CORONARY ANGIOGRAPHY;               Location: [place]; Surgeon: Katrine Coho, MD No date: ORIF ANKLE FRACTURE; Right No date: REPLACEMENT TOTAL HIP W/  RESURFACING IMPLANTS; Right 1948: TONSILLECTOMY 1971: TUBAL LIGATION 1985: VAGINAL HYSTERECTOMY     Comment:  d/t heavy bleeding  BMI    Body Mass Index: 28.56 kg/m      Reproductive/Obstetrics negative OB ROS                             Anesthesia Physical Anesthesia Plan  ASA: 3  Anesthesia Plan: General   Post-op Pain Management:    Induction: Intravenous  PONV Risk Score and Plan: 4 or greater and Ondansetron, Midazolam, Treatment may vary due to age or medical condition and Dexamethasone  Airway Management Planned: Oral ETT  Additional Equipment:   Intra-op Plan:   Post-operative Plan: Extubation in OR  Informed Consent: I have reviewed the patients History and Physical, chart, labs and discussed the procedure including the risks, benefits and alternatives for the proposed anesthesia with the patient or authorized representative who has indicated his/her understanding and acceptance.     Dental Advisory Given  Plan Discussed with: Anesthesiologist, CRNA and Surgeon  Anesthesia Plan Comments: (Patient consented for risks of anesthesia including but not limited to:  - adverse reactions to medications - damage to eyes, teeth, lips or other oral mucosa - nerve damage due to positioning  - sore throat or hoarseness - Damage to heart, brain, nerves, lungs, other parts of body or loss of life  Patient voiced understanding.)        Anesthesia Quick Evaluation

## 2022-07-18 NOTE — Anesthesia Postprocedure Evaluation (Signed)
Anesthesia Post Note  Patient: JAQUASHA GOTTS  Procedure(s) Performed: XI ROBOTIC ASSISTED LAPAROSCOPIC CHOLECYSTECTOMY INDOCYANINE GREEN FLUORESCENCE IMAGING (ICG)  Patient location during evaluation: PACU Anesthesia Type: General Level of consciousness: awake and alert Pain management: pain level controlled Vital Signs Assessment: post-procedure vital signs reviewed and stable Respiratory status: spontaneous breathing, nonlabored ventilation, respiratory function stable and patient connected to nasal cannula oxygen Cardiovascular status: blood pressure returned to baseline and stable Postop Assessment: no apparent nausea or vomiting Anesthetic complications: no   No notable events documented.   Last Vitals:  Vitals:   07/18/22 1252 07/18/22 1307  BP: (!) 144/68 (!) 142/64  Pulse: (!) 53 (!) 54  Resp: 15 14  Temp: 36.6 C 36.6 C  SpO2: 97% 96%    Last Pain:  Vitals:   07/18/22 1307  TempSrc: Temporal  PainSc: 0-No pain                 Martha Clan

## 2022-07-19 LAB — SURGICAL PATHOLOGY

## 2022-07-22 DIAGNOSIS — I340 Nonrheumatic mitral (valve) insufficiency: Secondary | ICD-10-CM | POA: Diagnosis not present

## 2022-07-22 DIAGNOSIS — I071 Rheumatic tricuspid insufficiency: Secondary | ICD-10-CM | POA: Diagnosis not present

## 2022-07-22 DIAGNOSIS — Z7982 Long term (current) use of aspirin: Secondary | ICD-10-CM | POA: Diagnosis not present

## 2022-07-22 DIAGNOSIS — E785 Hyperlipidemia, unspecified: Secondary | ICD-10-CM | POA: Diagnosis not present

## 2022-07-22 DIAGNOSIS — I471 Supraventricular tachycardia, unspecified: Secondary | ICD-10-CM | POA: Diagnosis not present

## 2022-07-22 DIAGNOSIS — I351 Nonrheumatic aortic (valve) insufficiency: Secondary | ICD-10-CM | POA: Diagnosis not present

## 2022-07-22 DIAGNOSIS — I10 Essential (primary) hypertension: Secondary | ICD-10-CM | POA: Diagnosis not present

## 2022-07-22 DIAGNOSIS — I2510 Atherosclerotic heart disease of native coronary artery without angina pectoris: Secondary | ICD-10-CM | POA: Diagnosis not present

## 2022-08-06 ENCOUNTER — Other Ambulatory Visit: Payer: Self-pay | Admitting: Physician Assistant

## 2022-08-07 ENCOUNTER — Encounter: Payer: Self-pay | Admitting: Surgery

## 2022-08-07 ENCOUNTER — Ambulatory Visit (INDEPENDENT_AMBULATORY_CARE_PROVIDER_SITE_OTHER): Payer: 59 | Admitting: Surgery

## 2022-08-07 VITALS — BP 129/74 | HR 65 | Temp 98.1°F | Ht 64.0 in | Wt 158.0 lb

## 2022-08-07 DIAGNOSIS — K8010 Calculus of gallbladder with chronic cholecystitis without obstruction: Secondary | ICD-10-CM

## 2022-08-07 DIAGNOSIS — Z09 Encounter for follow-up examination after completed treatment for conditions other than malignant neoplasm: Secondary | ICD-10-CM

## 2022-08-07 NOTE — Patient Instructions (Signed)
GENERAL POST-OPERATIVE PATIENT INSTRUCTIONS   WOUND CARE INSTRUCTIONS:  If the wound becomes bright red and painful or starts to drain infected material that is not clear, please contact your physician immediately.  If the wound is mildly pink and has a thick firm ridge underneath it, this is normal, and is referred to as a healing ridge.  This will resolve over the next 4-6 weeks.  BATHING: You may shower if you have been informed of this by your surgeon. However, Please do not submerge in a tub, hot tub, or pool until incisions are completely sealed or have been told by your surgeon that you may do so.  DIET:  You may eat any foods that you can tolerate.  It is a good idea to eat a high fiber diet and take in plenty of fluids to prevent constipation.  If you do become constipated you may want to take a mild laxative or take ducolax tablets on a daily basis until your bowel habits are regular.  Constipation can be very uncomfortable, along with straining, after recent surgery.  ACTIVITY:   You should not lift more than 20 pounds for 6 weeks total after surgery as it could put you at increased risk for complications. Twenty pounds is roughly equivalent to a plastic bag of groceries. At that time- Listen to your body when lifting, if you have pain when lifting, stop and then try again in a few days. Soreness after doing exercises or activities of daily living is normal as you get back in to your normal routine.  MEDICATIONS:  Try to take narcotic medications and anti-inflammatory medications, such as tylenol, ibuprofen, naprosyn, etc., with food.  This will minimize stomach upset from the medication.  Should you develop nausea and vomiting from the pain medication, or develop a rash, please discontinue the medication and contact your physician.  You should not drive, make important decisions, or operate machinery when taking narcotic pain medication.  SUNBLOCK Use sun block to incision area over the  next year if this area will be exposed to sun. This helps decrease scarring and will allow you avoid a permanent darkened area over your incision.  QUESTIONS:  Please feel free to call our office if you have any questions, and we will be glad to assist you. [phone]   

## 2022-08-09 NOTE — Progress Notes (Signed)
Amanda Duke is 2 1/2 weeks out from robotic cholecystectomy.  Doing very well.  Preoperative abdominal pain resolved. PE NAD Abd: soft, incisions c/d/I , healing well , no infection or peritonitis  A/P doing very well without complications.  RTC as needed

## 2022-08-15 DIAGNOSIS — H2513 Age-related nuclear cataract, bilateral: Secondary | ICD-10-CM | POA: Diagnosis not present

## 2022-08-15 DIAGNOSIS — Z83518 Family history of other specified eye disorder: Secondary | ICD-10-CM | POA: Diagnosis not present

## 2022-08-15 DIAGNOSIS — H25011 Cortical age-related cataract, right eye: Secondary | ICD-10-CM | POA: Diagnosis not present

## 2022-08-15 DIAGNOSIS — H25012 Cortical age-related cataract, left eye: Secondary | ICD-10-CM | POA: Diagnosis not present

## 2022-09-02 ENCOUNTER — Ambulatory Visit: Payer: 59 | Admitting: Physician Assistant

## 2022-09-06 DIAGNOSIS — H43813 Vitreous degeneration, bilateral: Secondary | ICD-10-CM | POA: Diagnosis not present

## 2022-09-13 ENCOUNTER — Other Ambulatory Visit: Payer: Self-pay | Admitting: Physician Assistant

## 2022-09-13 DIAGNOSIS — I10 Essential (primary) hypertension: Secondary | ICD-10-CM

## 2022-10-17 ENCOUNTER — Ambulatory Visit: Payer: 59 | Admitting: Obstetrics and Gynecology

## 2022-10-17 NOTE — Progress Notes (Deleted)
PCP: Carlean Jews, PA-C   No chief complaint on file.   HPI:      [name] is a 82 y.o. [id number] whose LMP was No LMP recorded. Patient has had a hysterectomy., presents today for her annual examination.  Her menses are absent due to vag hyst due to AUB. Has left ovary and part of right ovary remaining   Sex activity: {sex active: 315163}. She {does:18564} have vaginal dryness.  Last Pap: {AVWU:981191478}  Results were: {norm/abn:16707::"no abnormalities"} /neg HPV DNA.  Hx of STDs: {STD hx:14358}  Last mammogram: 06/05/22  Results were: normal--routine follow-up in 12 months There is a FH of breast cancer in her mom age 28. There is a FH of ovarian cancer in her sister in her 29s. The patient {does:18564} do self-breast exams. Pt is BRCA neg; doesn't meet medicare guidelines for updated testing.   Colonoscopy: {hx:15363}  Repeat due after 10*** years.   Tobacco use: {tob:20664} Alcohol use: {Alcohol:11675} No drug use Exercise: {exercise:31265}  She {does:18564} get adequate calcium and Vitamin D in her diet.  Labs with PCP.   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 02/20/2022   S/P repair of paraesophageal hernia 02/05/2022   Ventral hernia without obstruction or gangrene    History of spinal surgery 11/26/2021   History of hysterectomy 11/26/2021   Hypertensive disorder 11/26/2021   Dyslipidemia 09/10/2021   History of total hip arthroplasty 01/09/2021   Osteoarthritis of right hip 01/04/2021   Postoperative nausea and vomiting 12/29/2020   Groin strain 03/25/2020   Encounter for general adult medical examination with abnormal findings 01/19/2020   Encounter for screening mammogram for malignant neoplasm of breast 01/19/2020   Infection of tooth socket 01/18/2020   Atherosclerosis of aorta (HCC) 01/12/2020    Left hip pain 06/27/2019   Osteopenia of multiple sites 04/06/2019   Sepsis (HCC) 01/10/2019   Nausea 08/21/2018   Cough 04/16/2018   Screening for osteoporosis 12/24/2017   Generalized anxiety disorder 12/24/2017   Need for shingles vaccine 12/24/2017   Urinary tract infection without hematuria 09/12/2017   Acute sinusitis, unspecified 04/24/2017   Noninfective gastroenteritis and colitis, unspecified 04/24/2017   Solitary pulmonary nodule 04/24/2017   Acute pharyngitis, unspecified 04/24/2017   Chronic kidney disease, 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/2017   Palpitations 04/24/2017   Primary generalized (osteo)arthritis 04/24/2017   Vitamin D deficiency 04/24/2017   Impaired fasting glucose 04/24/2017   Dysuria 04/24/2017   Insomnia, unspecified 04/24/2017   Age-related osteoporosis without current pathological fracture 04/24/2017   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   Supraventricular tachycardia 05/23/2015   Family history of breast cancer in first degree relative 02/02/2015   Family history of ovarian cancer 02/02/2015   Irritable bowel syndrome with constipation 02/02/2015   Anxiety 02/02/2015   Osteoporosis 02/02/2015   Benign essential HTN 12/16/2014   Mixed hyperlipidemia 05/19/2014   Acid reflux 05/19/2014   Arteriosclerosis of coronary artery 05/19/2014   Chest pain 05/19/2014   Hiatal hernia with GERD 05/19/2014   Coronary artery disease 05/19/2014   Cystocele, midline 07/01/2013   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   Urge incontinence of urine 11/20/2012   Renal colic 11/20/2012   Symptoms involving urinary system 11/20/2012   Incomplete bladder emptying 11/20/2012   Frank hematuria 11/20/2012   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 11/20/2012    Past Surgical History:  Procedure Laterality Date   APPENDECTOMY  1971   ARTHRODESIS  1980   BACK SURGERY  1980   COLONOSCOPY WITH PROPOFOL N/A 12/12/2016    Procedure: COLONOSCOPY WITH PROPOFOL;  Surgeon: Christena Deem, MD;  Location: [facility] END[facility]COPY;  Service: Endoscopy;  Laterality: N/A;   COLONOSCOPY WITH PROPOFOL N/A 07/14/2019   Procedure: COLONOSCOPY WITH PROPOFOL;  Surgeon: Toledo, Boykin Nearing, MD;  Location: [facility] END[facility];  Service: Gastroenterology;  Laterality: N/A;   ESOPHAGOGASTRODUODENOSCOPY N/A 12/27/2021   Procedure: ESOPHAGOGASTRODUODENOSCOPY (EGD);  Surgeon: Sung Amabile, DO;  Location: [facility];  Service: General;  Laterality: N/A;   ESOPHAGOGASTRODUODENOSCOPY (EGD) WITH PROPOFOL N/A 12/12/2016   Procedure: ESOPHAGOGASTRODUODENOSCOPY (EGD) WITH PROPOFOL;  Surgeon: Christena Deem, MD;  Location: [facility] ENDOSCOPY;  Service: Endoscopy;  Laterality: N/A;   ESOPHAGOGASTRODUODENOSCOPY (EGD) WITH PROPOFOL N/A 07/14/2019   Procedure: ESOPHAGOGASTRODUODENOSCOPY (EGD) WITH PROPOFOL;  Surgeon: Toledo, Boykin Nearing, MD;  Location: [facility] END[facility];  Service: Gastroenterology;  Laterality: N/A;   INSERTION OF MESH  02/05/2022   Procedure: INSERTION OF MESH;  Surgeon: Leafy Ro, MD;  Location: [facility] ORS;  Service: General;;   LAPAROSCOPIC UNILATERAL SALPINGO OOPHERECTOMY  1985   right   LEFT HEART CATH AND CORONARY ANGIOGRAPHY Left 09/25/2001   Procedure: LEFT HEART CATH AND CORONARY ANGIOGRAPHY; Location: [facility]; Surgeon: Rudean Hitt, MD   LEFT HEART CATH AND CORONARY ANGIOGRAPHY Left 06/09/2003   Procedure: LEFT HEART CATH AND CORONARY ANGIOGRAPHY; Location: [facility]; Surgeon: Rudean Hitt, MD   ORIF ANKLE FRACTURE Right    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    TONSILLECTOMY  1948   TUBAL LIGATION  1971   UMBILICAL HERNIA REPAIR N/A 02/05/2022   Procedure: HERNIA REPAIR UMBILICAL ADULT;  Surgeon: Leafy Ro, MD;  Location: [facility] OR[facility];  Service: General;  Laterality: N/A;   VAGINAL HYSTERECTOMY  1985   d/t heavy bleeding   XI ROBOTIC ASSISTED PARAESOPHAGEAL HERNIA REPAIR N/A 02/05/2022   Procedure: XI ROBOTIC  ASSISTED PARAESOPHAGEAL HERNIA REPAIR, RNFA to assist;  Surgeon: Leafy Ro, MD;  Location: [facility] OR[facility];  Service: General;  Laterality: N/A;    Family History  Problem Relation Age of Onset   Heart failure Mother    Stroke Mother    Breast cancer Mother        dx at age 44 yo   Diabetes Mother    Osteoporosis Mother    Emphysema Father    Ovarian cancer Sister        dx at age 69 yo   Asthma Sister    Diabetes Daughter    Heart disease Daughter    COPD Daughter     Social History   Socioeconomic History   Marital status: Widowed    Spouse name: Not on file   Number of children: Not on file   Years of education: Not on file   Highest education level: Not on file  Occupational History   Not on file  Tobacco Use   Smoking status: Former    Packs/day: 0.50    Years: 5.00    Additional pack years: 0.00    Total pack years: 2.50    Types: Cigarettes    Quit date: 53    Years since quitting: 44.4    Passive exposure: Past   Smokeless tobacco: Never  Vaping Use   Vaping Use: Never used  Substance and Sexual Activity   Alcohol use: No    Alcohol/week:  0.0 standard drinks of alcohol   Drug use: Never   Sexual activity: Not Currently  Other Topics Concern   Not on file  Social History Narrative   Not on file   Social Determinants of Health   Financial Resource Strain: Low Risk  (11/17/2020)   Overall Financial Resource Strain (CARDIA)    Difficulty of Paying Living Expenses: Not very hard  Food Insecurity: No Food Insecurity (02/05/2022)   Hunger Vital Sign    Worried About Running Out of Food in the Last Year: Never true    Ran Out of Food in the Last Year: Never true  Transportation Needs: No Transportation Needs (02/05/2022)   PRAPARE - Administrator, Civil Service (Medical): No    Lack of Transportation (Non-Medical): No  Physical Activity: Inactive (01/10/2019)   Exercise Vital Sign    Days of Exercise per Week: 0 days    Minutes of  Exercise per Session: 0 min  Stress: No Stress Concern Present (01/10/2019)   [employer] of Occupational Health - Occupational Stress Questionnaire    Feeling of Stress : Not at all  Social Connections: Socially Isolated (01/10/2019)   Social Connection and Isolation Panel [NHANES]    Frequency of Communication with Friends and Family: Three times a week    Frequency of Social Gatherings with Friends and Family: Three times a week    Attends Religious Services: Never    Active Member of Clubs or Organizations: No    Attends Banker Meetings: Never    Marital Status: Widowed  Intimate Partner Violence: Not At Risk (02/05/2022)   Humiliation, Afraid, Rape, and Kick questionnaire    Fear of Current or Ex-Partner: No    Emotionally Abused: No    Physically Abused: No    Sexually Abused: No     Current Outpatient Medications:    acetaminophen (TYLENOL) 500 MG tablet, Take 1,000 mg by mouth every 6 (six) hours as needed for moderate pain., Disp: , Rfl:    aspirin EC 81 MG tablet, Take 81 mg by mouth daily. Swallow whole., Disp: , Rfl:    carvedilol (COREG) 3.125 MG tablet, Take 3.125 mg by mouth 2 (two) times daily with a meal. , Disp: , Rfl:    cholecalciferol (VITAMIN D) 1000 units tablet, Take 1,000 Units by mouth daily., Disp: , Rfl:    clonazePAM (KLONOPIN) 0.5 MG tablet, Take 1 tablet (0.5 mg total) by mouth as needed for anxiety. Take 1 tablet by mouth as needed, Disp: 30 tablet, Rfl: 1   diltiazem (CARDIZEM CD) 180 MG 24 hr capsule, TAKE 1 CAPSULE BY MOUTH EVERY DAY, Disp: 90 capsule, Rfl: 0   fluticasone (FLONASE) 50 MCG/ACT nasal spray, SPRAY 1 SPRAY INTO BOTH NOSTRILS DAILY., Disp: 16 mL, Rfl: 1   icosapent Ethyl (VASCEPA) 1 g capsule, Take 2 g by mouth 2 (two) times daily., Disp: , Rfl:    linaclotide (LINZESS) 290 MCG CAPS capsule, Take 1 capsule (290 mcg total) by mouth daily. (Patient taking differently: Take 290 mcg by mouth daily before breakfast.), Disp: 30  capsule, Rfl: 3   losartan (COZAAR) 100 MG tablet, Take 100 mg by mouth every morning., Disp: , Rfl:    ondansetron (ZOFRAN-ODT) 4 MG disintegrating tablet, Take 1 tablet (4 mg total) by mouth every 8 (eight) hours as needed. (Patient taking differently: Take 4 mg by mouth every 8 (eight) hours as needed for nausea or vomiting.), Disp: 20 tablet, Rfl: 0   pantoprazole (  PROTONIX) 40 MG tablet, Take 40 mg by mouth as needed., Disp: , Rfl:    rosuvastatin (CRESTOR) 40 MG tablet, Take 40 mg by mouth at bedtime., Disp: , Rfl:    solifenacin (VESICARE) 5 MG tablet, TAKE 1 TABLET (5 MG TOTAL) BY MOUTH DAILY. (Patient taking differently: Take 5 mg by mouth as needed.), Disp: 90 tablet, Rfl: 1     ROS:  Review of Systems BREAST: No symptoms    Objective: There were no vitals taken for this visit.   OBGyn Exam  Results: No results found for this or any previous visit (from the past 24 hour(s)).  Assessment/Plan:  No diagnosis found.   No orders of the defined types were placed in this encounter.           GYN counsel {counseling: 16159}    F/U  No follow-ups on file.  Trampas Stettner B. Chelcie Estorga, PA-C 10/17/2022 12:44 PM

## 2022-11-14 ENCOUNTER — Encounter: Payer: Self-pay | Admitting: Physician Assistant

## 2022-11-14 ENCOUNTER — Ambulatory Visit (INDEPENDENT_AMBULATORY_CARE_PROVIDER_SITE_OTHER): Payer: 59 | Admitting: Physician Assistant

## 2022-11-14 VITALS — BP 130/80 | HR 61 | Temp 98.2°F | Resp 16 | Ht 64.0 in | Wt 157.6 lb

## 2022-11-14 DIAGNOSIS — F411 Generalized anxiety disorder: Secondary | ICD-10-CM

## 2022-11-14 DIAGNOSIS — J309 Allergic rhinitis, unspecified: Secondary | ICD-10-CM | POA: Diagnosis not present

## 2022-11-14 DIAGNOSIS — I10 Essential (primary) hypertension: Secondary | ICD-10-CM | POA: Diagnosis not present

## 2022-11-14 DIAGNOSIS — R7303 Prediabetes: Secondary | ICD-10-CM | POA: Diagnosis not present

## 2022-11-14 NOTE — Progress Notes (Signed)
[facility] [address] [place], [place] [address]  Internal MEDICINE  Office Visit Note  Patient Name: Amanda Duke  [id number]  [id number]  Date of Service: 11/20/2022  Chief Complaint  Patient presents with   Follow-up   Gastroesophageal Reflux   Hypertension   Hyperlipidemia   Quality Metric Gaps    TDAP and Shingles Vaccines    HPI Pt is here for routine follow up -A little reflux so went back to protonix daily -checks sugar occasionally, but has been better with watching diet.  -BP stable -Recovered from GB surgery -Has also seen cardiology -using flonase for some sinus pressure/drip -was in [place] for 2 weeks and [place] for 2 weeks and pollen was bad in [place] and may have triggered her sinuses.   Current Medication: Outpatient Encounter Medications as of 11/14/2022  Medication Sig   acetaminophen (TYLENOL) 500 MG tablet Take 1,000 mg by mouth every 6 (six) hours as needed for moderate pain.   aspirin EC 81 MG tablet Take 81 mg by mouth daily. Swallow whole.   cholecalciferol (VITAMIN D) 1000 units tablet Take 1,000 Units by mouth daily.   clonazePAM (KLONOPIN) 0.5 MG tablet Take 1 tablet (0.5 mg total) by mouth as needed for anxiety. Take 1 tablet by mouth as needed   diltiazem (CARDIZEM CD) 180 MG 24 hr capsule TAKE 1 CAPSULE BY MOUTH EVERY DAY   fluticasone (FLONASE) 50 MCG/ACT nasal spray SPRAY 1 SPRAY INTO BOTH NOSTRILS DAILY.   icosapent Ethyl (VASCEPA) 1 g capsule Take 2 g by mouth 2 (two) times daily.   linaclotide (LINZESS) 290 MCG CAPS capsule Take 1 capsule (290 mcg total) by mouth daily. (Patient taking differently: Take 290 mcg by mouth daily before breakfast.)   losartan (COZAAR) 100 MG tablet Take 100 mg by mouth every morning.   ondansetron (ZOFRAN-ODT) 4 MG disintegrating tablet Take 1 tablet (4 mg total) by mouth every 8 (eight) hours as needed. (Patient taking differently: Take 4 mg by mouth every 8 (eight) hours as needed for nausea or  vomiting.)   pantoprazole (PROTONIX) 40 MG tablet Take 40 mg by mouth as needed.   rosuvastatin (CRESTOR) 40 MG tablet Take 40 mg by mouth at bedtime.   solifenacin (VESICARE) 5 MG tablet TAKE 1 TABLET (5 MG TOTAL) BY MOUTH DAILY. (Patient taking differently: Take 5 mg by mouth as needed.)   carvedilol (COREG) 3.125 MG tablet Take 3.125 mg by mouth 2 (two) times daily with a meal.    No facility-administered encounter medications on file as of 11/14/2022.    Surgical History: Past Surgical History:  Procedure Laterality Date   APPENDECTOMY  1971   ARTHRODESIS  1980   BACK SURGERY  1980   COLONOSCOPY WITH PROPOFOL N/A 12/12/2016   Procedure: COLONOSCOPY WITH PROPOFOL;  Surgeon: Christena Deem, MD;  Location: [facility] ENDOSCOPY;  Service: Endoscopy;  Laterality: N/A;   COLONOSCOPY WITH PROPOFOL N/A 07/14/2019   Procedure: COLONOSCOPY WITH PROPOFOL;  Surgeon: Toledo, Boykin Nearing, MD;  Location: [facility] ENDOSCOPY;  Service: Gastroenterology;  Laterality: N/A;   ESOPHAGOGASTRODUODENOSCOPY N/A 12/27/2021   Procedure: ESOPHAGOGASTRODUODENOSCOPY (EGD);  Surgeon: Sung Amabile, DO;  Location: [facility] ENDOSCOPY;  Service: General;  Laterality: N/A;   ESOPHAGOGASTRODUODENOSCOPY (EGD) WITH PROPOFOL N/A 12/12/2016   Procedure: ESOPHAGOGASTRODUODENOSCOPY (EGD) WITH PROPOFOL;  Surgeon: Christena Deem, MD;  Location: [facility] ENDOSCOPY;  Service: Endoscopy;  Laterality: N/A;   ESOPHAGOGASTRODUODENOSCOPY (EGD) WITH PROPOFOL N/A 07/14/2019   Procedure: ESOPHAGOGASTRODUODENOSCOPY (EGD) WITH PROPOFOL;  Surgeon: Hurdsfield,  Boykin Nearing, MD;  Location: [facility] ENDOSCOPY;  Service: Gastroenterology;  Laterality: N/A;   INSERTION OF MESH  02/05/2022   Procedure: INSERTION OF MESH;  Surgeon: Leafy Ro, MD;  Location: [facility] ORS;  Service: General;;   LAPAROSCOPIC UNILATERAL SALPINGO OOPHERECTOMY  1985   right   LEFT HEART CATH AND CORONARY ANGIOGRAPHY Left 09/25/2001   Procedure: LEFT HEART CATH AND CORONARY ANGIOGRAPHY;  Location: [facility]; Surgeon: Rudean Hitt, MD   LEFT HEART CATH AND CORONARY ANGIOGRAPHY Left 06/09/2003   Procedure: LEFT HEART CATH AND CORONARY ANGIOGRAPHY; Location: [facility]; Surgeon: Rudean Hitt, MD   ORIF ANKLE FRACTURE Right    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    TONSILLECTOMY  1948   TUBAL LIGATION  1971   UMBILICAL HERNIA REPAIR N/A 02/05/2022   Procedure: HERNIA REPAIR UMBILICAL ADULT;  Surgeon: Leafy Ro, MD;  Location: [facility] ORS;  Service: General;  Laterality: N/A;   VAGINAL HYSTERECTOMY  1985   d/t heavy bleeding   XI ROBOTIC ASSISTED PARAESOPHAGEAL HERNIA REPAIR N/A 02/05/2022   Procedure: XI ROBOTIC ASSISTED PARAESOPHAGEAL HERNIA REPAIR, RNFA to assist;  Surgeon: Leafy Ro, MD;  Location: [facility] ORS;  Service: General;  Laterality: N/A;    Medical History: Past Medical History:  Diagnosis Date   Anxiety    Aortic atherosclerosis (HCC)    Arthritis    Bilateral renal cysts    Coronary artery disease 09/25/2001   a.) LHC 09/25/2001: EF 78%. 20% pLAD - med mgmt; b.) LHC 06/09/2003: EF 63%. 20% pLCx, 20% pLAD, 50% mLAD, 50% dLAD - med mgmt.   Diastolic dysfunction 12/30/2017   a.) Stress echo 12/30/2017: ED >55%, triv PR, mild AR.MR, mod TR, G1DD; b.) TTE 03/30/2020: EF >55%, mild LVH, mild panvalvular regurgitation.   Gastritis    GERD (gastroesophageal reflux disease)    Hepatic steatosis    History of 2019 novel coronavirus disease (COVID-19) 01/11/2022   History of hiatal hernia    a.) s/p repair 01/2022   Hyperlipemia    Hyperplastic colon polyp    Hypertension    Hypoglycemia 05/2022   this is something new-seeing pcp for this currently   Pneumonia    PONV (postoperative nausea and vomiting)    a.) PONV following orthopedic surgery (hip) in 2022   Prediabetes    PSVT (paroxysmal supraventricular tachycardia)    Redundant colon    Tubular adenoma of colon    Umbilical hernia     Family History: Family History  Problem  Relation Age of Onset   Heart failure Mother    Stroke Mother    Breast cancer Mother        dx at age 63 yo   Diabetes Mother    Osteoporosis Mother    Emphysema Father    Ovarian cancer Sister        dx at age 15 yo   Asthma Sister    Diabetes Daughter    Heart disease Daughter    COPD Daughter     Social History   Socioeconomic History   Marital status: Widowed    Spouse name: Not on file   Number of children: Not on file   Years of education: Not on file   Highest education level: Not on file  Occupational History   Not on file  Tobacco Use   Smoking status: Former    Current packs/day: 0.00    Average packs/day: 0.5 packs/day for 5.0 years (2.5 ttl pk-yrs)  Types: Cigarettes    Start date: 75    Quit date: 71    Years since quitting: 44.5    Passive exposure: Past   Smokeless tobacco: Never  Vaping Use   Vaping status: Never Used  Substance and Sexual Activity   Alcohol use: No    Alcohol/week: 0.0 standard drinks of alcohol   Drug use: Never   Sexual activity: Not Currently  Other Topics Concern   Not on file  Social History Narrative   Not on file   Social Determinants of Health   Financial Resource Strain: Low Risk  (11/17/2020)   Overall Financial Resource Strain (CARDIA)    Difficulty of Paying Living Expenses: Not very hard  Food Insecurity: No Food Insecurity (02/05/2022)   Hunger Vital Sign    Worried About Running Out of Food in the Last Year: Never true    Ran Out of Food in the Last Year: Never true  Transportation Needs: No Transportation Needs (02/05/2022)   PRAPARE - Administrator, Civil Service (Medical): No    Lack of Transportation (Non-Medical): No  Physical Activity: Inactive (01/10/2019)   Exercise Vital Sign    Days of Exercise per Week: 0 days    Minutes of Exercise per Session: 0 min  Stress: No Stress Concern Present (01/10/2019)   [employer] of Occupational Health - Occupational Stress Questionnaire     Feeling of Stress : Not at all  Social Connections: Socially Isolated (01/10/2019)   Social Connection and Isolation Panel [NHANES]    Frequency of Communication with Friends and Family: Three times a week    Frequency of Social Gatherings with Friends and Family: Three times a week    Attends Religious Services: Never    Active Member of Clubs or Organizations: No    Attends Banker Meetings: Never    Marital Status: Widowed  Intimate Partner Violence: Not At Risk (02/05/2022)   Humiliation, Afraid, Rape, and Kick questionnaire    Fear of Current or Ex-Partner: No    Emotionally Abused: No    Physically Abused: No    Sexually Abused: No      Review of Systems  Constitutional:  Negative for chills, fatigue and unexpected weight change.  HENT:  Positive for postnasal drip and sinus pressure. Negative for dental problem, rhinorrhea, sneezing and sore throat.   Eyes:  Negative for redness.  Respiratory: Negative.  Negative for cough, chest tightness, shortness of breath and wheezing.   Cardiovascular: Negative.  Negative for chest pain and palpitations.  Gastrointestinal:  Negative for abdominal pain, constipation, diarrhea, nausea and vomiting.  Genitourinary:  Negative for dysuria and frequency.  Musculoskeletal:  Negative for arthralgias, back pain, joint swelling and neck pain.  Skin:  Negative for rash.  Neurological: Negative.  Negative for tremors and numbness.  Hematological:  Negative for adenopathy. Does not bruise/bleed easily.  Psychiatric/Behavioral:  Negative for behavioral problems (Depression), sleep disturbance and suicidal ideas. The patient is not nervous/anxious.     Vital Signs: BP 130/80   Pulse 61   Temp 98.2 F (36.8 C)   Resp 16   Ht 5\' 4"  (1.626 m)   Wt 157 lb 9.6 oz (71.5 kg)   SpO2 97%   BMI 27.05 kg/m    Physical Exam Vitals and nursing note reviewed.  Constitutional:      General: She is not in acute distress.     Appearance: Normal appearance. She is well-developed. She is not diaphoretic.  HENT:     Head: Normocephalic and atraumatic.     Mouth/Throat:     Pharynx: Posterior oropharyngeal erythema present.  Eyes:     Pupils: Pupils are equal, round, and reactive to light.  Neck:     Thyroid: No thyromegaly.     Vascular: No JVD.     Trachea: No tracheal deviation.  Cardiovascular:     Rate and Rhythm: Normal rate and regular rhythm.     Heart sounds: Normal heart sounds. No murmur heard.    No friction rub. No gallop.  Pulmonary:     Effort: Pulmonary effort is normal. No respiratory distress.     Breath sounds: No wheezing or rales.  Chest:     Chest wall: No tenderness.  Neurological:     Mental Status: She is alert and oriented to person, place, and time.     Cranial Nerves: No cranial nerve deficit.  Psychiatric:        Mood and Affect: Mood normal.        Behavior: Behavior normal.        Assessment/Plan: 1. Benign essential HTN Stable, continue current medications  2. Prediabetes Stable, continue to monitor and control with diet/exercise  3. Generalized anxiety disorder May continue klonopin as needed  4. Allergic rhinitis, unspecified seasonality, unspecified trigger Continue flonase, if progression of congestion or worsening symptoms call office   General Counseling: Lyrik verbalizes understanding of the findings of todays visit and agrees with plan of treatment. I have discussed any further diagnostic evaluation that may be needed or ordered today. We also reviewed her medications today. she has been encouraged to call the office with any questions or concerns that should arise related to todays visit.    No orders of the defined types were placed in this encounter.   No orders of the defined types were placed in this encounter.   This patient was seen by Lynn Ito, PA-C in collaboration with Dr. Beverely Risen as a part of collaborative care  agreement.   Total time spent:30 Minutes Time spent includes review of chart, medications, test results, and follow up plan with the patient.      Dr Lyndon Code Internal medicine

## 2022-11-26 DIAGNOSIS — M19071 Primary osteoarthritis, right ankle and foot: Secondary | ICD-10-CM | POA: Diagnosis not present

## 2022-11-27 ENCOUNTER — Ambulatory Visit: Payer: 59 | Admitting: Nurse Practitioner

## 2022-11-29 ENCOUNTER — Ambulatory Visit: Payer: 59 | Admitting: Nurse Practitioner

## 2022-12-08 ENCOUNTER — Other Ambulatory Visit: Payer: Self-pay | Admitting: Physician Assistant

## 2022-12-08 DIAGNOSIS — I10 Essential (primary) hypertension: Secondary | ICD-10-CM

## 2022-12-19 DIAGNOSIS — M19071 Primary osteoarthritis, right ankle and foot: Secondary | ICD-10-CM | POA: Diagnosis not present

## 2023-01-17 ENCOUNTER — Other Ambulatory Visit: Payer: Self-pay | Admitting: Physician Assistant

## 2023-01-17 DIAGNOSIS — F411 Generalized anxiety disorder: Secondary | ICD-10-CM

## 2023-01-20 NOTE — Telephone Encounter (Signed)
Ast 7/24 next 12/24

## 2023-01-25 ENCOUNTER — Encounter: Payer: Self-pay | Admitting: Internal Medicine

## 2023-01-25 ENCOUNTER — Other Ambulatory Visit: Payer: Self-pay

## 2023-01-25 ENCOUNTER — Observation Stay
Admission: EM | Admit: 2023-01-25 | Discharge: 2023-01-26 | Disposition: A | Discharge status: Home Health | Payer: 59 | Attending: Internal Medicine | Admitting: Internal Medicine

## 2023-01-25 ENCOUNTER — Emergency Department: Payer: 59

## 2023-01-25 ENCOUNTER — Observation Stay
Admit: 2023-01-25 | Discharge: 2023-01-25 | Disposition: A | Discharge status: Home / Self Care | Payer: 59 | Attending: Internal Medicine

## 2023-01-25 DIAGNOSIS — Z8616 Personal history of COVID-19: Secondary | ICD-10-CM | POA: Insufficient documentation

## 2023-01-25 DIAGNOSIS — Z7982 Long term (current) use of aspirin: Secondary | ICD-10-CM | POA: Insufficient documentation

## 2023-01-25 DIAGNOSIS — Z87891 Personal history of nicotine dependence: Secondary | ICD-10-CM | POA: Diagnosis not present

## 2023-01-25 DIAGNOSIS — R300 Dysuria: Secondary | ICD-10-CM | POA: Insufficient documentation

## 2023-01-25 DIAGNOSIS — Z79899 Other long term (current) drug therapy: Secondary | ICD-10-CM | POA: Diagnosis not present

## 2023-01-25 DIAGNOSIS — R29818 Other symptoms and signs involving the nervous system: Principal | ICD-10-CM | POA: Insufficient documentation

## 2023-01-25 DIAGNOSIS — G459 Transient cerebral ischemic attack, unspecified: Secondary | ICD-10-CM | POA: Diagnosis not present

## 2023-01-25 DIAGNOSIS — R2990 Unspecified symptoms and signs involving the nervous system: Secondary | ICD-10-CM | POA: Diagnosis present

## 2023-01-25 DIAGNOSIS — Z96641 Presence of right artificial hip joint: Secondary | ICD-10-CM | POA: Insufficient documentation

## 2023-01-25 DIAGNOSIS — N182 Chronic kidney disease, stage 2 (mild): Secondary | ICD-10-CM | POA: Diagnosis present

## 2023-01-25 DIAGNOSIS — F419 Anxiety disorder, unspecified: Secondary | ICD-10-CM | POA: Diagnosis present

## 2023-01-25 DIAGNOSIS — R339 Retention of urine, unspecified: Secondary | ICD-10-CM | POA: Diagnosis present

## 2023-01-25 DIAGNOSIS — E785 Hyperlipidemia, unspecified: Secondary | ICD-10-CM | POA: Diagnosis present

## 2023-01-25 DIAGNOSIS — K219 Gastro-esophageal reflux disease without esophagitis: Secondary | ICD-10-CM | POA: Diagnosis present

## 2023-01-25 DIAGNOSIS — Z90710 Acquired absence of both cervix and uterus: Secondary | ICD-10-CM | POA: Diagnosis present

## 2023-01-25 DIAGNOSIS — I700 Atherosclerosis of aorta: Secondary | ICD-10-CM | POA: Insufficient documentation

## 2023-01-25 DIAGNOSIS — I6523 Occlusion and stenosis of bilateral carotid arteries: Secondary | ICD-10-CM | POA: Insufficient documentation

## 2023-01-25 DIAGNOSIS — I6389 Other cerebral infarction: Secondary | ICD-10-CM | POA: Diagnosis not present

## 2023-01-25 DIAGNOSIS — I2510 Atherosclerotic heart disease of native coronary artery without angina pectoris: Secondary | ICD-10-CM | POA: Insufficient documentation

## 2023-01-25 DIAGNOSIS — Z7902 Long term (current) use of antithrombotics/antiplatelets: Secondary | ICD-10-CM | POA: Insufficient documentation

## 2023-01-25 DIAGNOSIS — R7301 Impaired fasting glucose: Secondary | ICD-10-CM | POA: Insufficient documentation

## 2023-01-25 DIAGNOSIS — R338 Other retention of urine: Secondary | ICD-10-CM | POA: Diagnosis not present

## 2023-01-25 DIAGNOSIS — K449 Diaphragmatic hernia without obstruction or gangrene: Secondary | ICD-10-CM | POA: Diagnosis not present

## 2023-01-25 DIAGNOSIS — R531 Weakness: Secondary | ICD-10-CM | POA: Diagnosis present

## 2023-01-25 DIAGNOSIS — I129 Hypertensive chronic kidney disease with stage 1 through stage 4 chronic kidney disease, or unspecified chronic kidney disease: Secondary | ICD-10-CM | POA: Diagnosis not present

## 2023-01-25 DIAGNOSIS — G4700 Insomnia, unspecified: Secondary | ICD-10-CM | POA: Diagnosis present

## 2023-01-25 DIAGNOSIS — I10 Essential (primary) hypertension: Secondary | ICD-10-CM | POA: Diagnosis present

## 2023-01-25 LAB — URINALYSIS, COMPLETE (UACMP) WITH MICROSCOPIC
Bacteria, UA: NONE SEEN
Bilirubin Urine: NEGATIVE
Glucose, UA: NEGATIVE mg/dL
Hgb urine dipstick: NEGATIVE
Ketones, ur: NEGATIVE mg/dL
Leukocytes,Ua: NEGATIVE
Nitrite: NEGATIVE
Protein, ur: NEGATIVE mg/dL
Specific Gravity, Urine: 1.004 — ABNORMAL LOW (ref 1.005–1.030)
pH: 8 (ref 5.0–8.0)

## 2023-01-25 LAB — COMPREHENSIVE METABOLIC PANEL
ALT: 13 U/L (ref 0–44)
AST: 22 U/L (ref 15–41)
Albumin: 4.1 g/dL (ref 3.5–5.0)
Alkaline Phosphatase: 85 U/L (ref 38–126)
Anion gap: 10 (ref 5–15)
BUN: 16 mg/dL (ref 8–23)
CO2: 23 mmol/L (ref 22–32)
Calcium: 9 mg/dL (ref 8.9–10.3)
Chloride: 105 mmol/L (ref 98–111)
Creatinine, Ser: 1 mg/dL (ref 0.44–1.00)
GFR, Estimated: 56 mL/min — ABNORMAL LOW (ref >60)
Glucose, Bld: 120 mg/dL — ABNORMAL HIGH (ref 70–99)
Potassium: 3.5 mmol/L (ref 3.5–5.1)
Sodium: 138 mmol/L (ref 135–145)
Total Bilirubin: 0.6 mg/dL (ref 0.3–1.2)
Total Protein: 7.6 g/dL (ref 6.5–8.1)

## 2023-01-25 LAB — CBC
HCT: 42.9 % (ref 36.0–46.0)
Hemoglobin: 14.3 g/dL (ref 12.0–15.0)
MCH: 28.9 pg (ref 26.0–34.0)
MCHC: 33.3 g/dL (ref 30.0–36.0)
MCV: 86.7 fL (ref 80.0–100.0)
Platelets: 305 10*3/uL (ref 150–400)
RBC: 4.95 MIL/uL (ref 3.87–5.11)
RDW: 14.7 % (ref 11.5–15.5)
WBC: 6.8 10*3/uL (ref 4.0–10.5)
nRBC: 0 % (ref 0.0–0.2)

## 2023-01-25 LAB — DIFFERENTIAL
Abs Immature Granulocytes: 0.03 10*3/uL (ref 0.00–0.07)
Basophils Absolute: 0.1 10*3/uL (ref 0.0–0.1)
Basophils Relative: 1 %
Eosinophils Absolute: 0.2 10*3/uL (ref 0.0–0.5)
Eosinophils Relative: 3 %
Immature Granulocytes: 0 %
Lymphocytes Relative: 42 %
Lymphs Abs: 2.8 10*3/uL (ref 0.7–4.0)
Monocytes Absolute: 0.8 10*3/uL (ref 0.1–1.0)
Monocytes Relative: 12 %
Neutro Abs: 2.8 10*3/uL (ref 1.7–7.7)
Neutrophils Relative %: 42 %

## 2023-01-25 LAB — PROTIME-INR
INR: 0.9 (ref 0.8–1.2)
Prothrombin Time: 12.7 s (ref 11.4–15.2)

## 2023-01-25 LAB — LIPID PANEL
Cholesterol: 210 mg/dL — ABNORMAL HIGH (ref 0–200)
HDL: 58 mg/dL (ref >40)
LDL Cholesterol: 131 mg/dL — ABNORMAL HIGH (ref 0–99)
Total CHOL/HDL Ratio: 3.6 {ratio}
Triglycerides: 107 mg/dL (ref <150)
VLDL: 21 mg/dL (ref 0–40)

## 2023-01-25 LAB — ETHANOL: Alcohol, Ethyl (B): <10 mg/dL (ref <10)

## 2023-01-25 LAB — APTT: aPTT: 26 s (ref 24–36)

## 2023-01-25 LAB — CBG MONITORING, ED: Glucose-Capillary: 104 mg/dL — ABNORMAL HIGH (ref 70–99)

## 2023-01-25 MED ORDER — MELATONIN 5 MG PO TABS: 5.0000 mg | ORAL_TABLET | Freq: Every evening | ORAL | Status: DC | PRN

## 2023-01-25 MED ORDER — LABETALOL HCL 5 MG/ML IV SOLN: 5.0000 mg | INTRAVENOUS | Status: DC | PRN

## 2023-01-25 MED ORDER — ACETAMINOPHEN 325 MG PO TABS: 650.0000 mg | ORAL_TABLET | ORAL | Status: DC | PRN

## 2023-01-25 MED ORDER — CLOPIDOGREL BISULFATE 75 MG PO TABS
300.0000 mg | ORAL_TABLET | Freq: Once | ORAL | Status: AC
  Administered 2023-01-25: 300 mg via ORAL
  Filled 2023-01-25: qty 4

## 2023-01-25 MED ORDER — DEXTROSE 50 % IV SOLN: 1.0000 | INTRAVENOUS | Status: DC | PRN

## 2023-01-25 MED ORDER — ASPIRIN 81 MG PO CHEW
324.0000 mg | CHEWABLE_TABLET | Freq: Once | ORAL | Status: AC
  Administered 2023-01-25: 324 mg via ORAL
  Filled 2023-01-25: qty 4

## 2023-01-25 MED ORDER — ACETAMINOPHEN 160 MG/5ML PO SOLN: 650.0000 mg | ORAL | Status: DC | PRN

## 2023-01-25 MED ORDER — STROKE: EARLY STAGES OF RECOVERY BOOK: Freq: Once | Status: AC

## 2023-01-25 MED ORDER — ROSUVASTATIN CALCIUM 20 MG PO TABS
40.0000 mg | ORAL_TABLET | Freq: Every day | ORAL | Status: DC
  Administered 2023-01-25: 40 mg via ORAL
  Filled 2023-01-25: qty 2

## 2023-01-25 MED ORDER — VITAMIN D 25 MCG (1000 UNIT) PO TABS
1000.0000 [IU] | ORAL_TABLET | Freq: Every day | ORAL | Status: DC
  Administered 2023-01-26: 1000 [IU] via ORAL
  Filled 2023-01-25: qty 1

## 2023-01-25 MED ORDER — ASPIRIN 81 MG PO TBEC
81.0000 mg | DELAYED_RELEASE_TABLET | Freq: Every day | ORAL | Status: DC
  Administered 2023-01-26: 81 mg via ORAL
  Filled 2023-01-25: qty 1

## 2023-01-25 MED ORDER — FLUTICASONE PROPIONATE 50 MCG/ACT NA SUSP: 1.0000 | Freq: Every day | NASAL | Status: DC | PRN

## 2023-01-25 MED ORDER — PANTOPRAZOLE SODIUM 40 MG PO TBEC
40.0000 mg | DELAYED_RELEASE_TABLET | ORAL | Status: DC | PRN
  Administered 2023-01-25 – 2023-01-26 (×2): 40 mg via ORAL
  Filled 2023-01-25 (×2): qty 1

## 2023-01-25 MED ORDER — ACETAMINOPHEN 650 MG RE SUPP: 650.0000 mg | RECTAL | Status: DC | PRN

## 2023-01-25 MED ORDER — LORAZEPAM 2 MG/ML IJ SOLN: 0.5000 mg | INTRAMUSCULAR | Status: DC | PRN

## 2023-01-25 MED ORDER — LINACLOTIDE 145 MCG PO CAPS
290.0000 ug | ORAL_CAPSULE | Freq: Every day | ORAL | Status: DC
  Administered 2023-01-26: 290 ug via ORAL
  Filled 2023-01-25: qty 1
  Filled 2023-01-25: qty 2
  Filled 2023-01-25: qty 1

## 2023-01-25 MED ORDER — ICOSAPENT ETHYL 1 G PO CAPS
2.0000 g | ORAL_CAPSULE | Freq: Two times a day (BID) | ORAL | Status: DC
  Administered 2023-01-25 – 2023-01-26 (×2): 2 g via ORAL
  Filled 2023-01-25 (×3): qty 2

## 2023-01-25 MED ORDER — SENNOSIDES-DOCUSATE SODIUM 8.6-50 MG PO TABS: 1.0000 | ORAL_TABLET | Freq: Every evening | ORAL | Status: DC | PRN

## 2023-01-25 NOTE — Progress Notes (Signed)
  Echocardiogram 2D Echocardiogram has been performed.  Lenor Coffin 01/25/2023, 3:33 PM

## 2023-01-25 NOTE — ED Notes (Signed)
Advised nursed that patient has ready bed

## 2023-01-25 NOTE — Hospital Course (Addendum)
[name] is a 82 year old female with history of hypertension, anxiety, hyperlipidemia, GERD, who presents emergency department for chief concerns of right-sided weakness and numbness.  Vitals in the ED showed temperature of 97.5, respiration rate of 28, heart rate of 68, blood pressure 161/85, SpO2 98% on room air.  Serum sodium is 138, potassium 2.5, chloride 105, bicarb 23, BUN of 16,'s serum creatinine 1.0, EGFR 56, nonfasting blood glucose 120, WBC 6.8, hemoglobin 14.3, platelets of 305.  CT of the head code stroke without contrast: Was read as normal for age noncontrast CT appearance of the brain.  ED treatment: Aspirin 324 mg p.o. one-time dose.

## 2023-01-25 NOTE — Evaluation (Signed)
Occupational Therapy Evaluation Patient Details Name: Amanda Duke MRN: [id number] DOB: 02/12/1941 Today's Date: 01/25/2023   History of Present Illness presented to ER secondary to acute onset of R-sided weakness, numbness; admitted for TIA/CVA work-up.  Initial head CT negative for acute intracranial insult.   Clinical Impression   Pt seen for OT evaluation this date.  Son present intermittently throughout evaluation.  Pt demonstrates ability to perform basic self care during eval with set up/supv-modified indep.  Set up d/t pt reporting feeling a little more unsteady than normal, but performs well with RW.  Pt reports that she does have a RW at home but does not use it, occasionally using a cane in community setting.  With RW, pt managing ADLs in bathroom today with modified indep, including changing a brief, performing toilet transfer, standing hand hygiene, and transferring out of bed and into chair.  Good BUE strength with no significant discrepancy, L slightly stronger but this is pt's dominant side.  Pt reports initial RUE weakness early this morning during onset of symptoms, but now reports only general fatigue and some residual tingling in the R arm.  RUE GMC/FMC skills WNL.  AE needs assessed for home; none recommended at this time.  No additional skilled OT in the acute setting, though will re-eval as needed should any new symptoms arise that may contribute to ADL decline.  Pt acknowledges baseline status with ADLs when using RW today.  Pt in agreement with plan.    If plan is discharge home, recommend the following: A little help with walking and/or transfers    Functional Status Assessment  Patient has had a recent decline in their functional status and demonstrates the ability to make significant improvements in function in a reasonable and predictable amount of time.  Equipment Recommendations  None recommended by OT    Recommendations for Other Services       Precautions  / Restrictions Precautions Precautions: Fall Restrictions Weight Bearing Restrictions: No      Mobility Bed Mobility Overal bed mobility: Modified Independent       Supine to sit: Modified independent (Device/Increase time)       Patient Response: Cooperative  Transfers Overall transfer level: Needs assistance Equipment used: Rolling walker (2 wheels) Transfers: Sit to/from Stand Sit to Stand: Supervision, Contact guard assist                  Balance Overall balance assessment: Needs assistance Sitting-balance support: No upper extremity supported, Feet supported Sitting balance-Leahy Scale: Normal Sitting balance - Comments: able to reach below knees and thread legs through new pull up without UE support or LOB.   Standing balance support: Bilateral upper extremity supported, During functional activity Standing balance-Leahy Scale: Fair                             ADL either performed or assessed with clinical judgement   ADL Overall ADL's : At baseline                                       General ADL Comments: able to amb to bathroom with RW and stand by assist, modified indep with toilet transfer and changing brief, modified indep clothing management and hand hygiene at sink.     Vision Patient Visual Report: No change from baseline Additional Comments: Pt reports no  recent changes.  Assessed visual scanning, peripheral vision, all WNL.  Hx of cataracts, per pt.     Perception         Praxis Praxis: WFL       Pertinent Vitals/Pain Pain Assessment Pain Assessment: No/denies pain     Extremity/Trunk Assessment Upper Extremity Assessment Upper Extremity Assessment: Overall WFL for tasks assessed   Lower Extremity Assessment Lower Extremity Assessment: Defer to PT evaluation       Communication Communication Communication: No apparent difficulties   Cognition Arousal: Alert Behavior During Therapy: WFL for  tasks assessed/performed Overall Cognitive Status: Within Functional Limits for tasks assessed                                 General Comments: 0x4     General Comments       Exercises Other Exercises Other Exercises: Educ on OT role   Shoulder Instructions      Home Living Family/patient expects to be discharged to:: Private residence Living Arrangements: Children Available Help at Discharge: Family Type of Home: House Home Access: Stairs to enter Secretary/administrator of Steps: 3-4 Entrance Stairs-Rails: Left Home Layout: One level     Bathroom Shower/Tub: Tub/shower unit         Home Equipment: Grab bars - toilet;Grab bars - tub/shower   Additional Comments: Pt denies need for shower chair d/t grab bar      Prior Functioning/Environment Prior Level of Function : Independent/Modified Independent             Mobility Comments: Per pt and PT noted: Mod indep for ADLs, household and community mobilization; intermittent use of SPC for mobility (tends to use grocery cart in stores/longer distances). Denies fall history. ADLs Comments: modified indep-indep with ADLs and IADLs, including driving and managing her own grocery shopping.        OT Problem List: Impaired sensation;Impaired balance (sitting and/or standing)      OT Treatment/Interventions:      OT Goals(Current goals can be found in the care plan section) Acute Rehab OT Goals Patient Stated Goal: Home with family OT Goal Formulation: With patient Time For Goal Achievement: 02/08/23 Potential to Achieve Goals: Good ADL Goals Pt Will Perform Lower Body Dressing: with set-up Pt Will Transfer to Toilet: with modified independence Pt Will Perform Toileting - Clothing Manipulation and hygiene: with modified independence  OT Frequency:                    AM-PAC OT "6 Clicks" Daily Activity     Outcome Measure Help from another person eating meals?: None Help from another  person taking care of personal grooming?: None Help from another person toileting, which includes using toliet, bedpan, or urinal?: None Help from another person bathing (including washing, rinsing, drying)?: None Help from another person to put on and taking off regular upper body clothing?: None Help from another person to put on and taking off regular lower body clothing?: None 6 Click Score: 24   End of Session Equipment Utilized During Treatment: Rolling walker (2 wheels) Nurse Communication: Mobility status  Activity Tolerance: Patient tolerated treatment well Patient left: in chair;with call bell/phone within reach;with chair alarm set  OT Visit Diagnosis: Unsteadiness on feet (R26.81);Muscle weakness (generalized) (M62.81)                Time: 5621-3086 OT Time Calculation (min): 30 min Charges:  OT  General Charges $OT Visit: 1 Visit OT Evaluation $OT Eval Low Complexity: 1 Low  Danelle Earthly, MS, OTR/L  Otis Dials 01/25/2023, 12:10 PM

## 2023-01-25 NOTE — ED Provider Notes (Signed)
Care assumed of patient from outgoing provider.  See their note for initial history, exam and plan.  Clinical Course as of 01/25/23 0701  Sat Jan 25, 2023  0621 I received a phone call from Dr. Margo Aye with radiology who confirmed no acute findings on the code stroke CT scan.  I subsequently personally viewed and interpreted the scan and I also see no evidence of acute abnormality. [CF]  [phone] Code stroke activated - baseline at 11p, woke up 4-5a R sided arm weakness/numbness. NIH 2. Ct head negative. Neuro recs pending.  [SM]    Clinical Course User Index [CF] Loleta Rose, MD [SM] Corena Herter, MD     Corena Herter, MD 01/25/23 [phone]

## 2023-01-25 NOTE — ED Triage Notes (Addendum)
Patient presents to the ER c/o r sided weakness. Patient states that she woke up about an hour ago and felt like her R arm and R leg were numbness. Patient states that she was up walking to the bathroom when she felt the numbness and weakness to her right side. NIH 0. Patient transfers to bed from wheelchair. CBG 104. Patient has no noted weakness to extremity upon assessment. Patient is AOX4. Nad noted. Resp even and unlabored. Skin pwd. Speech Clear and Concise.Code stroke activated at bedside with dr. York Cerise.  Prior to an hour ago, patient went to bed around 11 p.m.

## 2023-01-25 NOTE — Assessment & Plan Note (Signed)
Home antihypertensive medications not resumed on admission Labetalol 5 mg IV every 3 hours as needed for SBP greater than 220, 48 hours of coverage ordered for permissive hypertension

## 2023-01-25 NOTE — Consult Note (Addendum)
NEUROLOGY CONSULT NOTE   Date of service: January 25, 2023 Patient Name: Amanda Duke MRN:  [id number] DOB:  Jun 20, 1940 Chief Complaint: "Right-sided weakness" Requesting Provider: Lovenia Kim, DO  History of Present Illness  Amanda Duke is a 82 y.o. left-handed or ambidextrous woman with a past medical history significant for hypertension, hyperlipidemia, coronary artery disease, mild CKD, anxiety on intermittent benzos (reports approximately 3-4 doses per week of 1/4-1/2 tab), remote lumbar degenerative disc disease s/p lumbar surgery in the 1980s, minor very remote tobacco use  She reports that she was in her usual state of health initially at 4 AM on awakening, however she subsequently developed some right hand paresthesias and weakness while using her phone.  Then when she went to walk she felt weak in the right leg as if it was going to buckle beneath her and she sat down to avoid falling.  Due to her symptoms she woke her son and asked him to take her to the ED (avoiding EMS transport due to not wanting to scare her grandchildren)  On arrival her symptoms had essentially resolved other than she reports she still felt a little disoriented but no longer felt any weakness or numbness.  Therefore no TNK was given and she was admitted for stroke/TIA workup.  I was consulted for a full evaluation   ROS   A detailed 10 point review of system was completed and negative except above.  Past History   Past Medical History:  Diagnosis Date   Anxiety    Aortic atherosclerosis (HCC)    Arthritis    Bilateral renal cysts    Coronary artery disease 09/25/2001   a.) LHC 09/25/2001: EF 78%. 20% pLAD - med mgmt; b.) LHC 06/09/2003: EF 63%. 20% pLCx, 20% pLAD, 50% mLAD, 50% dLAD - med mgmt.   Diastolic dysfunction 12/30/2017   a.) Stress echo 12/30/2017: ED >55%, triv PR, mild AR.MR, mod TR, G1DD; b.) TTE 03/30/2020: EF >55%, mild LVH, mild panvalvular regurgitation.   Gastritis    GERD  (gastroesophageal reflux disease)    Hepatic steatosis    History of 2019 novel coronavirus disease (COVID-19) 01/11/2022   History of hiatal hernia    a.) s/p repair 01/2022   Hyperlipemia    Hyperplastic colon polyp    Hypertension    Hypoglycemia 05/2022   this is something new-seeing pcp for this currently   Pneumonia    PONV (postoperative nausea and vomiting)    a.) PONV following orthopedic surgery (hip) in 2022   Prediabetes    PSVT (paroxysmal supraventricular tachycardia)    Redundant colon    Tubular adenoma of colon    Umbilical hernia    Past Surgical History:  Procedure Laterality Date   APPENDECTOMY  1971   ARTHRODESIS  1980   BACK SURGERY  1980   COLONOSCOPY WITH PROPOFOL N/A 12/12/2016   Procedure: COLONOSCOPY WITH PROPOFOL;  Surgeon: Christena Deem, MD;  Location: [facility] ENDOSCOPY;  Service: Endoscopy;  Laterality: N/A;   COLONOSCOPY WITH PROPOFOL N/A 07/14/2019   Procedure: COLONOSCOPY WITH PROPOFOL;  Surgeon: Toledo, Boykin Nearing, MD;  Location: [facility] ENDOSCOPY;  Service: Gastroenterology;  Laterality: N/A;   ESOPHAGOGASTRODUODENOSCOPY N/A 12/27/2021   Procedure: ESOPHAGOGASTRODUODENOSCOPY (EGD);  Surgeon: Sung Amabile, DO;  Location: [facility] ENDOSCOPY;  Service: General;  Laterality: N/A;   ESOPHAGOGASTRODUODENOSCOPY (EGD) WITH PROPOFOL N/A 12/12/2016   Procedure: ESOPHAGOGASTRODUODENOSCOPY (EGD) WITH PROPOFOL;  Surgeon: Christena Deem, MD;  Location: [facility] ENDOSCOPY;  Service: Endoscopy;  Laterality: N/A;  ESOPHAGOGASTRODUODENOSCOPY (EGD) WITH PROPOFOL N/A 07/14/2019   Procedure: ESOPHAGOGASTRODUODENOSCOPY (EGD) WITH PROPOFOL;  Surgeon: Toledo, Boykin Nearing, MD;  Location: [facility] ENDOSCOPY;  Service: Gastroenterology;  Laterality: N/A;   INSERTION OF MESH  02/05/2022   Procedure: INSERTION OF MESH;  Surgeon: Leafy Ro, MD;  Location: [facility] ORS;  Service: General;;   LAPAROSCOPIC UNILATERAL SALPINGO OOPHERECTOMY  1985   right   LEFT HEART CATH AND CORONARY  ANGIOGRAPHY Left 09/25/2001   Procedure: LEFT HEART CATH AND CORONARY ANGIOGRAPHY; Location: [facility]; Surgeon: Rudean Hitt, MD   LEFT HEART CATH AND CORONARY ANGIOGRAPHY Left 06/09/2003   Procedure: LEFT HEART CATH AND CORONARY ANGIOGRAPHY; Location: [facility]; Surgeon: Rudean Hitt, MD   ORIF ANKLE FRACTURE Right    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    TONSILLECTOMY  1948   TUBAL LIGATION  1971   UMBILICAL HERNIA REPAIR N/A 02/05/2022   Procedure: HERNIA REPAIR UMBILICAL ADULT;  Surgeon: Leafy Ro, MD;  Location: [facility] ORS;  Service: General;  Laterality: N/A;   VAGINAL HYSTERECTOMY  1985   d/t heavy bleeding   XI ROBOTIC ASSISTED PARAESOPHAGEAL HERNIA REPAIR N/A 02/05/2022   Procedure: XI ROBOTIC ASSISTED PARAESOPHAGEAL HERNIA REPAIR, RNFA to assist;  Surgeon: Leafy Ro, MD;  Location: [facility] ORS;  Service: General;  Laterality: N/A;   Family History  Problem Relation Age of Onset   Heart failure Mother    Stroke Mother    Breast cancer Mother        dx at age 36 yo   Diabetes Mother    Osteoporosis Mother    Emphysema Father    Ovarian cancer Sister        dx at age 2 yo   Asthma Sister    Diabetes Daughter    Heart disease Daughter    COPD Daughter    Social History   Socioeconomic History   Marital status: Widowed    Spouse name: Not on file   Number of children: Not on file   Years of education: Not on file   Highest education level: Not on file  Occupational History   Not on file  Tobacco Use   Smoking status: Former    Current packs/day: 0.00    Average packs/day: 0.5 packs/day for 5.0 years (2.5 ttl pk-yrs)    Types: Cigarettes    Start date: 2    Quit date: 37    Years since quitting: 44.7    Passive exposure: Past   Smokeless tobacco: Never  Vaping Use   Vaping status: Never Used  Substance and Sexual Activity   Alcohol use: No    Alcohol/week: 0.0 standard drinks of alcohol   Drug use: Never   Sexual activity: Not  Currently  Other Topics Concern   Not on file  Social History Narrative   Not on file   Social Determinants of Health   Financial Resource Strain: Low Risk  (11/17/2020)   Overall Financial Resource Strain (CARDIA)    Difficulty of Paying Living Expenses: Not very hard  Food Insecurity: No Food Insecurity (02/05/2022)   Hunger Vital Sign    Worried About Running Out of Food in the Last Year: Never true    Ran Out of Food in the Last Year: Never true  Transportation Needs: No Transportation Needs (02/05/2022)   PRAPARE - Administrator, Civil Service (Medical): No    Lack of Transportation (Non-Medical): No  Physical Activity: Inactive (01/10/2019)   Exercise Vital Sign  Days of Exercise per Week: 0 days    Minutes of Exercise per Session: 0 min  Stress: No Stress Concern Present (01/10/2019)   [employer] of Occupational Health - Occupational Stress Questionnaire    Feeling of Stress : Not at all  Social Connections: Socially Isolated (01/10/2019)   Social Connection and Isolation Panel [NHANES]    Frequency of Communication with Friends and Family: Three times a week    Frequency of Social Gatherings with Friends and Family: Three times a week    Attends Religious Services: Never    Active Member of Clubs or Organizations: No    Attends Banker Meetings: Never    Marital Status: Widowed   Allergies  Allergen Reactions   Amoxicillin Hives    Patient tolerated Ceftriaxone in ED on 01/10/19.    Clindamycin Diarrhea   Gemfibrozil Other (See Comments)    muscle ache   Levofloxacin Other (See Comments)    Muscle ache for several months   Metoprolol Other (See Comments)    "Lowers blood pressure extremely low"   Simvastatin Other (See Comments)    muscle ache   Macrobid [Nitrofurantoin Macrocrystal] Diarrhea   Niacin Rash   Nystatin Rash   Nystatin-Triamcinolone Rash   Sulfa Antibiotics Rash    Family history of medication allergy     Medications   Medications Prior to Admission  Medication Sig Dispense Refill Last Dose   acetaminophen (TYLENOL) 500 MG tablet Take 1,000 mg by mouth every 6 (six) hours as needed for moderate pain.      aspirin EC 81 MG tablet Take 81 mg by mouth daily. Swallow whole.      carvedilol (COREG) 3.125 MG tablet Take 3.125 mg by mouth 2 (two) times daily with a meal.       cholecalciferol (VITAMIN D) 1000 units tablet Take 1,000 Units by mouth daily.      clonazePAM (KLONOPIN) 0.5 MG tablet TAKE 1 TABLET (0.5 MG TOTAL) BY MOUTH AS NEEDED FOR ANXIETY. TAKE 1 TABLET BY MOUTH AS NEEDED 30 tablet 0    diltiazem (CARDIZEM CD) 180 MG 24 hr capsule TAKE 1 CAPSULE BY MOUTH EVERY DAY 90 capsule 0    fluticasone (FLONASE) 50 MCG/ACT nasal spray SPRAY 1 SPRAY INTO BOTH NOSTRILS DAILY. 16 mL 1    icosapent Ethyl (VASCEPA) 1 g capsule Take 2 g by mouth 2 (two) times daily.      linaclotide (LINZESS) 290 MCG CAPS capsule Take 1 capsule (290 mcg total) by mouth daily. (Patient taking differently: Take 290 mcg by mouth daily before breakfast.) 30 capsule 3    losartan (COZAAR) 100 MG tablet Take 100 mg by mouth every morning.      ondansetron (ZOFRAN-ODT) 4 MG disintegrating tablet Take 1 tablet (4 mg total) by mouth every 8 (eight) hours as needed. (Patient taking differently: Take 4 mg by mouth every 8 (eight) hours as needed for nausea or vomiting.) 20 tablet 0    pantoprazole (PROTONIX) 40 MG tablet Take 40 mg by mouth as needed.      rosuvastatin (CRESTOR) 40 MG tablet Take 40 mg by mouth at bedtime.      solifenacin (VESICARE) 5 MG tablet TAKE 1 TABLET (5 MG TOTAL) BY MOUTH DAILY. (Patient taking differently: Take 5 mg by mouth as needed.) 90 tablet 1      Vitals  Current vital signs: BP (!) 156/77 (BP Location: Right Arm)   Pulse 60   Temp 98.1 F (36.7 C) (  Oral)   Resp 18   Ht 5\' 5"  (1.651 m)   Wt 74.1 kg   SpO2 98%   BMI 27.19 kg/m  Vital signs in last 24 hours: Temp:  [97.8 F (36.6  C)-98.2 F (36.8 C)] 98.1 F (36.7 C) (09/28 0911) Pulse Rate:  [58-67] 60 (09/28 0911) Resp:  [12-28] 18 (09/28 0911) BP: (149-195)/(72-89) 156/77 (09/28 0911) SpO2:  [96 %-99 %] 98 % (09/28 0911) Weight:  [74.1 kg] 74.1 kg (09/28 0550)     Body mass index is 27.19 kg/m.  Physical Exam   Constitutional: Appears well-developed and well-nourished.  Psych: Affect appropriate to situation, mildly anxious but cooperative and pleasant Eyes: No scleral injection.  HENT: No OP obstruction.  Head: Normocephalic.  Cardiovascular: Perfusing extremities well Respiratory: Effort normal, non-labored breathing.  GI: Soft.  No distension. There is no tenderness.  Skin: WDI.   Neurologic Examination   Physical Exam  Constitutional: Appears well-developed and well-nourished.  Psych: Affect appropriate to situation Eyes: No scleral injection HENT: No OP obstrucion MSK: no joint deformities.  Cardiovascular: Normal rate and regular rhythm.  Respiratory: Effort normal, non-labored breathing GI: Soft.  No distension. There is no tenderness.  Skin: WDI  Neuro: Mental Status: Patient is awake, alert, oriented to person, place, month, year, and situation. Patient is able to give a clear and coherent history No signs of aphasia or neglect Cranial Nerves: II: Visual Fields are full. Pupils are equal, round, and reactive to light.   III,IV, VI: EOMI without ptosis or diploplia, though she does have some slight direction changing nystagmus on examination, she reports no dizziness V: Facial sensation is symmetric to temperature VII: Facial movement is symmetric.  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 XII: tongue is midline without atrophy or fasciculations.  Motor: Tone is normal. Bulk is normal. 5/5 strength was present in all four extremities other than mild hip flexor weakness, 4 - on the right  and 4+ on the left.  Slightly slower finger tapping on the left hand compared to right Sensory: Sensation is symmetric to light touch and temperature in the arms and legs. Deep Tendon Reflexes: 2+ and symmetric in the biceps and patellae.  Cerebellar: FNF and HKS are intact bilaterally  NIHSS total 0    Labs   CBC:  Recent Labs  Lab 01/25/23 0553  WBC 6.8  NEUTROABS 2.8  HGB 14.3  HCT 42.9  MCV 86.7  PLT 305    Basic Metabolic Panel:  Lab Results  Component Value Date   NA 138 01/25/2023   K 3.5 01/25/2023   CO2 23 01/25/2023   GLUCOSE 120 (H) 01/25/2023   BUN 16 01/25/2023   CREATININE 1.00 01/25/2023   CALCIUM 9.0 01/25/2023   GFRNONAA 56 (L) 01/25/2023   GFRAA 55 (L) 08/03/2019   Lipid Panel:  Lab Results  Component Value Date   CHOL 210 (H) 01/25/2023   HDL 58 01/25/2023   LDLCALC 131 (H) 01/25/2023   TRIG 107 01/25/2023   CHOLHDL 3.6 01/25/2023    HgbA1c:  Lab Results  Component Value Date   HGBA1C 5.7 (A) 06/10/2022     CT Head without contrast(Personally reviewed): Normal for age noncontrast CT appearance of the brain.  ASPECTS 10.  CT angio Head and Neck with contrast(Personally reviewed): pending   Impression   Amanda Duke is a 82 y.o. female with TIA versus small stroke,  unfortunately intolerant of MRI due to anxiety.  Do not feel that the benefit from MRI is outweighed by the risk of general anesthesia in a person of her age and therefore will obtain repeat head CT with CTA imaging to confirm no significant interval change  Recommendations   # Concern for left sided stroke/TIA resulting in right-sided numbness and weakness - Stroke labs HgbA1c, fasting lipid panel - LDL not meeting goal despite rosuvastatin 40 mg home medication, however she reports poor adherence  -Repeat lipid panel in 3 to 6 months, with escalation of the therapy if cholesterol remains above goal despite adherence - MRI brain refused by patient due to severe  anxiety, therefore we will repeat head CT at a 24-hour mark with CTA completed at the same time - Frequent neuro checks - Echocardiogram - Prophylactic therapy-Antiplatelet med: Aspirin - dose 325mg  PO or 300mg  PR, followed by 81 mg daily - Plavix 300 mg load with 75 mg daily course to be determined pending vessel imaging - Risk factor modification, diet, exercise and medication adherence counseling - Telemetry monitoring;  - Blood pressure goal permissive hypertension for 24 to 48 hours - PT consult, OT consult, Speech consult - Consider outpatient neurocognitive testing due to patient's reports of difficulty remembering her medications at times and some slight repetitive questioning during my evaluation - Neurology (Dr. Wilford Corner tomorrow) will follow-up CTA head and neck as well as echocardiogram and make final recommendations on duration of Plavix.  Otherwise we will sign off and be available as needed.  Please reach out with questions or concerns.   ______________________________________________________________________   Thank you for the opportunity to take part in the care of this patient. If you have any further questions, please contact the neurology consultation team on call. Updated oncall schedule is listed on AMION.  Signed,  Brooke Dare MD-PhD [facility]

## 2023-01-25 NOTE — Assessment & Plan Note (Signed)
Home rosuvastatin 40 mg nightly and Vascepa 2 g p.o. twice daily resumed

## 2023-01-25 NOTE — Assessment & Plan Note (Signed)
As needed home PPI resumed

## 2023-01-25 NOTE — Assessment & Plan Note (Signed)
At baseline 

## 2023-01-25 NOTE — Assessment & Plan Note (Addendum)
Stroke-like symptoms Neurology has been consulted and recommends one-time dose of Plavix 200 mg p.o. and neurology service will continue to follow MRI of the brain ordered on admission as patient is very afraid of MRI machine Fasting lipid and A1c ordered Permissive hypertension per teleneurology: Labetalol 5 mg IV every 3 hours as needed for SBP greater than 220, 48 hours ordered Frequent neuro vascular checks PT, OT Fall precaution

## 2023-01-25 NOTE — ED Notes (Signed)
Code Stroke called to [facility] spoke with [facility]  /rt side numbness/weakness b/p 195/80 last normal was @11 :00pm

## 2023-01-25 NOTE — Progress Notes (Addendum)
Code stroke timeline MRS 1 LKW 2230 0636 Code stroke cart activation. Pt had CT and EDP assessment prior to cart activation 0641 Neuro [facility] paged [phone] Neuro [facility] on camera Imogene Burn) negative NCCT results relayed during report.  0702 Neuro [facility] and [facility] off camera. No TNK or advanced imaging at this time  -[place], Multimedia programmer.

## 2023-01-25 NOTE — H&P (Signed)
History and Physical   Amanda Duke TDD:[id number] DOB: July 15, 1940 DOA: 01/25/2023  PCP: Carlean Jews, PA-C  Patient coming from: home  I have personally briefly reviewed patient's old medical records in [facility] EMR.  Chief Concern: Home  HPI: [name] is a 82 year old female with history of hypertension, anxiety, hyperlipidemia, GERD, who presents emergency department for chief concerns of right-sided weakness and numbness.  Vitals in the ED showed temperature of 97.5, respiration rate of 28, heart rate of 68, blood pressure 161/85, SpO2 98% on room air.  Serum sodium is 138, potassium 2.5, chloride 105, bicarb 23, BUN of 16,'s serum creatinine 1.0, EGFR 56, nonfasting blood glucose 120, WBC 6.8, hemoglobin 14.3, platelets of 305.  CT of the head code stroke without contrast: Was read as normal for age noncontrast CT appearance of the brain.  ED treatment: Aspirin 324 mg p.o. one-time dose. -------------------------------- At bedside patient able to tell me her name, age, location, current calendar year.  She reports that she woke up with right-sided weakness, numbness in the right leg and right arm.  Patient states that she was sleeping on her left side.  She reports this is never happened before.  She reports the symptoms lasted about 2 to 5 minutes and then resolved on its own.  She endorses dysuria that started yesterday.  She denies hematuria.  She endorses intermittent nausea especially when she becomes hypoglycemic.  She denies trauma to her person.  She reports occasionally she has a stool chest discomfort on the right side.  It will last for several moments and goes away.  The symptoms are present when she is resting and goes away when she is resting.  She denies shortness of breath.  She states that she ambulates with a cane and can walk to the mailbox if she needed to.  She denies swelling of her lower extremities.  Social history: She lives at her  own home.  Her son lives with her.  She denies tobacco, EtOH, recreational drug use.  She is retired and formerly worked in the [employer].  ROS: Constitutional: no weight change, no fever ENT/Mouth: no sore throat, no rhinorrhea Eyes: no eye pain, no vision changes Cardiovascular: + chest dull pain, no dyspnea,  no edema, no palpitations Respiratory: no cough, no sputum, no wheezing Gastrointestinal: no nausea, no vomiting, no diarrhea, no constipation Genitourinary: no urinary incontinence, no dysuria, no hematuria Musculoskeletal: no arthralgias, no myalgias Skin: no skin lesions, no pruritus, Neuro: + weakness of her right upper and lower extremities, no loss of consciousness, no syncope Psych: no anxiety, no depression, + decrease appetite Heme/Lymph: no bruising, no bleeding  ED Course: Discussed with emergency medicine provider, patient requiring hospitalization for chief concerns of stroke  Assessment/Plan  Principal Problem:   Stroke-like symptom Active Problems:   Incomplete bladder emptying   Acid reflux   Benign essential HTN   Anxiety   Chronic kidney disease, stage 2 (mild)   Impaired fasting glucose   Dysuria   Insomnia, unspecified   Hiatal hernia with GERD   Dyslipidemia   History of hysterectomy   Hypertensive disorder   Assessment and Plan:  * Stroke-like symptom Stroke-like symptoms Neurology has been consulted and recommends one-time dose of Plavix 200 mg p.o. and neurology service will continue to follow MRI of the brain ordered on admission as patient is very afraid of MRI machine Fasting lipid and A1c ordered Permissive hypertension per teleneurology: Labetalol 5 mg IV every 3  hours as needed for SBP greater than 220, 48 hours ordered Frequent neuro vascular checks PT, OT Fall precaution  Hypertensive disorder Home antihypertensive medications not resumed on admission Labetalol 5 mg IV every 3 hours as needed for SBP greater than 220, 48  hours of coverage ordered for permissive hypertension  Dyslipidemia Home rosuvastatin 40 mg nightly and Vascepa 2 g p.o. twice daily resumed  Hiatal hernia with GERD Status post hernia repair  Dysuria Patient has prolapsed bladder and states she has frequent UTI Check a UA on admission  Impaired fasting glucose Patient reports hypoglycemic blood glucose episodes and is not on any diabetes medication Patient has never been diagnosed with diabetes despite multiple PCP checks D50, 1 amp as needed for low blood glucose  Chronic kidney disease, stage 2 (mild) At base line  Acid reflux As needed home PPI resumed  Chart reviewed.   DVT prophylaxis: None at this time AM team to initiate pharmacologic DVT prophylaxis when the benefits outweigh the risk Code Status: Full code Diet: Heart healthy Family Communication: No Disposition Plan: Pending clinical course Consults called: Neurology Admission status: Telemetry medical, observation  Past Medical History:  Diagnosis Date   Anxiety    Aortic atherosclerosis (HCC)    Arthritis    Bilateral renal cysts    Coronary artery disease 09/25/2001   a.) LHC 09/25/2001: EF 78%. 20% pLAD - med mgmt; b.) LHC 06/09/2003: EF 63%. 20% pLCx, 20% pLAD, 50% mLAD, 50% dLAD - med mgmt.   Diastolic dysfunction 12/30/2017   a.) Stress echo 12/30/2017: ED >55%, triv PR, mild AR.MR, mod TR, G1DD; b.) TTE 03/30/2020: EF >55%, mild LVH, mild panvalvular regurgitation.   Gastritis    GERD (gastroesophageal reflux disease)    Hepatic steatosis    History of 2019 novel coronavirus disease (COVID-19) 01/11/2022   History of hiatal hernia    a.) s/p repair 01/2022   Hyperlipemia    Hyperplastic colon polyp    Hypertension    Hypoglycemia 05/2022   this is something new-seeing pcp for this currently   Pneumonia    PONV (postoperative nausea and vomiting)    a.) PONV following orthopedic surgery (hip) in 2022   Prediabetes    PSVT (paroxysmal  supraventricular tachycardia)    Redundant colon    Tubular adenoma of colon    Umbilical hernia    Past Surgical History:  Procedure Laterality Date   APPENDECTOMY  1971   ARTHRODESIS  1980   BACK SURGERY  1980   COLONOSCOPY WITH PROPOFOL N/A 12/12/2016   Procedure: COLONOSCOPY WITH PROPOFOL;  Surgeon: Christena Deem, MD;  Location: [facility] ENDOSCOPY;  Service: Endoscopy;  Laterality: N/A;   COLONOSCOPY WITH PROPOFOL N/A 07/14/2019   Procedure: COLONOSCOPY WITH PROPOFOL;  Surgeon: Toledo, Boykin Nearing, MD;  Location: [facility] ENDOSCOPY;  Service: Gastroenterology;  Laterality: N/A;   ESOPHAGOGASTRODUODENOSCOPY N/A 12/27/2021   Procedure: ESOPHAGOGASTRODUODENOSCOPY (EGD);  Surgeon: Sung Amabile, DO;  Location: [facility] ENDOSCOPY;  Service: General;  Laterality: N/A;   ESOPHAGOGASTRODUODENOSCOPY (EGD) WITH PROPOFOL N/A 12/12/2016   Procedure: ESOPHAGOGASTRODUODENOSCOPY (EGD) WITH PROPOFOL;  Surgeon: Christena Deem, MD;  Location: [facility] ENDOSCOPY;  Service: Endoscopy;  Laterality: N/A;   ESOPHAGOGASTRODUODENOSCOPY (EGD) WITH PROPOFOL N/A 07/14/2019   Procedure: ESOPHAGOGASTRODUODENOSCOPY (EGD) WITH PROPOFOL;  Surgeon: Toledo, Boykin Nearing, MD;  Location: [facility] ENDOSCOPY;  Service: Gastroenterology;  Laterality: N/A;   INSERTION OF MESH  02/05/2022   Procedure: INSERTION OF MESH;  Surgeon: Leafy Ro, MD;  Location: [facility] ORS;  Service: General;;  LAPAROSCOPIC UNILATERAL SALPINGO OOPHERECTOMY  1985   right   LEFT HEART CATH AND CORONARY ANGIOGRAPHY Left 09/25/2001   Procedure: LEFT HEART CATH AND CORONARY ANGIOGRAPHY; Location: [facility]; Surgeon: Rudean Hitt, MD   LEFT HEART CATH AND CORONARY ANGIOGRAPHY Left 06/09/2003   Procedure: LEFT HEART CATH AND CORONARY ANGIOGRAPHY; Location: [facility]; Surgeon: Rudean Hitt, MD   ORIF ANKLE FRACTURE Right    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    TONSILLECTOMY  1948   TUBAL LIGATION  1971   UMBILICAL HERNIA REPAIR N/A 02/05/2022    Procedure: HERNIA REPAIR UMBILICAL ADULT;  Surgeon: Leafy Ro, MD;  Location: [facility] ORS;  Service: General;  Laterality: N/A;   VAGINAL HYSTERECTOMY  1985   d/t heavy bleeding   XI ROBOTIC ASSISTED PARAESOPHAGEAL HERNIA REPAIR N/A 02/05/2022   Procedure: XI ROBOTIC ASSISTED PARAESOPHAGEAL HERNIA REPAIR, RNFA to assist;  Surgeon: Leafy Ro, MD;  Location: [facility] ORS;  Service: General;  Laterality: N/A;   Social History:  reports that she quit smoking about 44 years ago. Her smoking use included cigarettes. She started smoking about 49 years ago. She has a 2.5 pack-year smoking history. She has been exposed to tobacco smoke. She has never used smokeless tobacco. She reports that she does not drink alcohol and does not use drugs.  Allergies  Allergen Reactions   Amoxicillin Hives    Patient tolerated Ceftriaxone in ED on 01/10/19.    Clindamycin Diarrhea   Gemfibrozil Other (See Comments)    muscle ache   Levofloxacin Other (See Comments)    Muscle ache for several months   Metoprolol Other (See Comments)    "Lowers blood pressure extremely low"   Simvastatin Other (See Comments)    muscle ache   Macrobid [Nitrofurantoin Macrocrystal] Diarrhea   Niacin Rash   Nystatin Rash   Nystatin-Triamcinolone Rash   Sulfa Antibiotics Rash    Family history of medication allergy   Family History  Problem Relation Age of Onset   Heart failure Mother    Stroke Mother    Breast cancer Mother        dx at age 76 yo   Diabetes Mother    Osteoporosis Mother    Emphysema Father    Ovarian cancer Sister        dx at age 5 yo   Asthma Sister    Diabetes Daughter    Heart disease Daughter    COPD Daughter    Family history: Family history reviewed and not pertinent.  Prior to Admission medications   Medication Sig Start Date End Date Taking? Authorizing Provider  acetaminophen (TYLENOL) 500 MG tablet Take 1,000 mg by mouth every 6 (six) hours as needed for moderate pain.    [provider]  aspirin EC 81 MG tablet Take 81 mg by mouth daily. Swallow whole.    [provider]  carvedilol (COREG) 3.125 MG tablet Take 3.125 mg by mouth 2 (two) times daily with a meal.  12/25/18 07/18/22  [provider]  cholecalciferol (VITAMIN D) 1000 units tablet Take 1,000 Units by mouth daily.    [provider]  clonazePAM (KLONOPIN) 0.5 MG tablet TAKE 1 TABLET (0.5 MG TOTAL) BY MOUTH AS NEEDED FOR ANXIETY. TAKE 1 TABLET BY MOUTH AS NEEDED 01/21/23   Sallyanne Kuster, NP  diltiazem (CARDIZEM CD) 180 MG 24 hr capsule TAKE 1 CAPSULE BY MOUTH EVERY DAY 12/09/22   McDonough, Lauren K, PA-C  fluticasone (FLONASE) 50 MCG/ACT nasal  spray SPRAY 1 SPRAY INTO BOTH NOSTRILS DAILY. 08/06/22   McDonough, Salomon Fick, PA-C  icosapent Ethyl (VASCEPA) 1 g capsule Take 2 g by mouth 2 (two) times daily.    [provider]  linaclotide (LINZESS) 290 MCG CAPS capsule Take 1 capsule (290 mcg total) by mouth daily. Patient taking differently: Take 290 mcg by mouth daily before breakfast. 09/28/18   Carlean Jews, NP  losartan (COZAAR) 100 MG tablet Take 100 mg by mouth every morning.    [provider]  ondansetron (ZOFRAN-ODT) 4 MG disintegrating tablet Take 1 tablet (4 mg total) by mouth every 8 (eight) hours as needed. Patient taking differently: Take 4 mg by mouth every 8 (eight) hours as needed for nausea or vomiting. 05/30/22   Delton Prairie, MD  pantoprazole (PROTONIX) 40 MG tablet Take 40 mg by mouth as needed. 03/09/18   [provider]  rosuvastatin (CRESTOR) 40 MG tablet Take 40 mg by mouth at bedtime. 08/14/20   [provider]  solifenacin (VESICARE) 5 MG tablet TAKE 1 TABLET (5 MG TOTAL) BY MOUTH DAILY. Patient taking differently: Take 5 mg by mouth as needed. 11/05/21   Lyndon Code, MD   Physical Exam: Vitals:   01/25/23 0630 01/25/23 0645 01/25/23 0801 01/25/23 0911  BP: (!) 149/72  (!) 179/77 (!) 156/77  Pulse: (!) 58 (!) 58 65 60   Resp: 14 12 15 18   Temp:   98.2 F (36.8 C) 98.1 F (36.7 C)  TempSrc:   Oral Oral  SpO2: 97% 96% 99% 98%  Weight:      Height:       Constitutional: appears age-appropriate, NAD, calm Eyes: PERRL, lids and conjunctivae normal ENMT: Mucous membranes are moist. Posterior pharynx clear of any exudate or lesions. Age-appropriate dentition. Hearing appropriate Neck: normal, supple, no masses, no thyromegaly Respiratory: clear to auscultation bilaterally, no wheezing, no crackles. Normal respiratory effort. No accessory muscle use.  Cardiovascular: Regular rate and rhythm, no murmurs / rubs / gallops. No extremity edema. 2+ pedal pulses. No carotid bruits.  Abdomen: no tenderness, no masses palpated, no hepatosplenomegaly. Bowel sounds positive.  Musculoskeletal: no clubbing / cyanosis. No joint deformity upper and lower extremities. Good ROM, no contractures, no atrophy. Normal muscle tone.  Skin: no rashes, lesions, ulcers. No induration Neurologic: Sensation intact. Strength 5/5 in all 4.  Psychiatric: Normal judgment and insight. Alert and oriented x 3. Normal mood.   EKG: independently reviewed, showing irregular heart rhythm with rate of 71, QTc 460  Chest x-ray on Admission: Not indicated at this time  CT HEAD CODE STROKE WO CONTRAST  Addendum Date: 01/25/2023   ADDENDUM REPORT: 01/25/2023 06:19 ADDENDUM: Study discussed by telephone with Dr. Loleta Rose on 01/25/2023 at 0607 hours. Electronically Signed   By: Odessa Fleming M.D.   On: 01/25/2023 06:19   Result Date: 01/25/2023 CLINICAL DATA:  Code stroke. 82 year old female with right side deficits. EXAM: CT HEAD WITHOUT CONTRAST TECHNIQUE: Contiguous axial images were obtained from the base of the skull through the vertex without intravenous contrast. RADIATION DOSE REDUCTION: This exam was performed according to the departmental dose-optimization program which includes automated exposure control, adjustment of the mA and/or kV according  to patient size and/or use of iterative reconstruction technique. COMPARISON:  Report of head CT 07/15/1998 (no images available). FINDINGS: Brain: Cerebral volume is within normal limits for age. No midline shift, mass effect, or evidence of intracranial mass lesion. No acute intracranial hemorrhage identified. No ventriculomegaly. Gray-white  differentiation appears normal for age, symmetric. No acute or chronic cortically based infarct identified. Vascular: No suspicious intracranial vascular hyperdensity. Minimal Calcified atherosclerosis at the skull base. Skull: Intact.  No acute osseous abnormality identified. Sinuses/Orbits: Minimal bubbly opacity in the left sphenoid, visualized paranasal sinuses and mastoids are well aerated. Other: No gaze deviation. Visualized orbits and scalp soft tissues are within normal limits. ASPECTS [facility] Stroke Program Early CT Score) Total score (0-10 with 10 being normal): 10 IMPRESSION: Normal for age noncontrast CT appearance of the brain.  ASPECTS 10. Electronically Signed: By: Odessa Fleming M.D. On: 01/25/2023 06:06    Labs on Admission: I have personally reviewed following labs  CBC: Recent Labs  Lab 01/25/23 0553  WBC 6.8  NEUTROABS 2.8  HGB 14.3  HCT 42.9  MCV 86.7  PLT 305   Basic Metabolic Panel: Recent Labs  Lab 01/25/23 0553  NA 138  K 3.5  CL 105  CO2 23  GLUCOSE 120*  BUN 16  CREATININE 1.00  CALCIUM 9.0   GFR: Estimated Creatinine Clearance: 43.7 mL/min (by C-G formula based on SCr of 1 mg/dL).  Liver Function Tests: Recent Labs  Lab 01/25/23 0553  AST 22  ALT 13  ALKPHOS 85  BILITOT 0.6  PROT 7.6  ALBUMIN 4.1   Coagulation Profile: Recent Labs  Lab 01/25/23 0553  INR 0.9   CBG: Recent Labs  Lab 01/25/23 0544  GLUCAP 104*   Lipid Profile: Recent Labs    01/25/23 0918  CHOL 210*  HDL 58  LDLCALC 131*  TRIG 107  CHOLHDL 3.6   Urine analysis:    Component Value Date/Time   COLORURINE YELLOW (A) 05/30/2022  0041   APPEARANCEUR HAZY (A) 05/30/2022 0041   APPEARANCEUR Cloudy (A) 03/28/2022 1615   LABSPEC 1.011 05/30/2022 0041   LABSPEC 1.004 03/06/2013 1158   PHURINE 6.0 05/30/2022 0041   GLUCOSEU NEGATIVE 05/30/2022 0041   GLUCOSEU Negative 03/06/2013 1158   HGBUR NEGATIVE 05/30/2022 0041   BILIRUBINUR NEGATIVE 05/30/2022 0041   BILIRUBINUR Negative 03/28/2022 1615   BILIRUBINUR Negative 03/06/2013 1158   KETONESUR NEGATIVE 05/30/2022 0041   PROTEINUR NEGATIVE 05/30/2022 0041   UROBILINOGEN 0.2 05/17/2021 1129   NITRITE NEGATIVE 05/30/2022 0041   LEUKOCYTESUR SMALL (A) 05/30/2022 0041   LEUKOCYTESUR 3+ 03/06/2013 1158   This document was prepared using Dragon Voice Recognition software and may include unintentional dictation errors.  Dr. Sedalia Muta [facility]  If 7PM-7AM, please contact overnight-coverage provider If 7AM-7PM, please contact day attending provider [email or web address]  01/25/2023, 10:51 AM

## 2023-01-25 NOTE — Evaluation (Addendum)
Physical Therapy Evaluation Patient Details Name: Amanda Duke MRN: [id number] DOB: 1940/05/30 Today's Date: 01/25/2023  History of Present Illness  presented to ER secondary to acute onset of R-sided weakness, numbness; admitted for TIA/CVA work-up.  Initial head CT negative for acute intracranial insult.  Clinical Impression  Patient resting in bed upon arrival to room; alert and oriented, follows commands and agreeable to participation with session.  Endorses sensing that R UE > LE still "not quite right", but improved since initial onset of symptoms.  Denies pain.  Bilat UE/LE strength and ROM grossly symmetrical and WFL; no focal weakness appreciated.  Able to complete bed mobility indep; sit/stand, basic transfers and gait (140') with RW, close sup/mod indep.  Demonstrates reciprocal stepping pattern, decreased cadence/gait speed; fair step height/length, mildly antalgic (previous orthopedic hip injury). does prefer use of RW at this time ("it makes my hip feel better"); discussed use at initial disharge for optimal safety/stability, weaning with continued PT    Would benefit from skilled PT to address above deficits and promote optimal return to PLOF.; recommend post-acute PT follow up as indicated by interdisciplinary care team.          If plan is discharge home, recommend the following: A little help with walking and/or transfers;A little help with bathing/dressing/bathroom   Can travel by private vehicle        Equipment Recommendations  (has RW)  Recommendations for Other Services       Functional Status Assessment Patient has had a recent decline in their functional status and demonstrates the ability to make significant improvements in function in a reasonable and predictable amount of time.     Precautions / Restrictions Precautions Precautions: Fall Restrictions Weight Bearing Restrictions: No      Mobility  Bed Mobility Overal bed mobility: Needs  Assistance Bed Mobility: Supine to Sit, Sit to Supine     Supine to sit: Modified independent (Device/Increase time) Sit to supine: Modified independent (Device/Increase time)        Transfers Overall transfer level: Needs assistance Equipment used: Rolling walker (2 wheels) Transfers: Sit to/from Stand Sit to Stand: Contact guard assist, Supervision                Ambulation/Gait Ambulation/Gait assistance: Contact guard assist, Supervision Gait Distance (Feet): 140 Feet Assistive device: Rolling walker (2 wheels)         General Gait Details: reciprocal stepping pattern, decreased cadence/gait speed; fair step height/length, mildly antalgic (previous orthopedic hip injury).  does prefer use of RW at this time ("it makes my hip feel better"); discussed use at initial disharge for optimal safety/stability, weaning with continued PT  Stairs  Stairs deferred this date due to generalized fatigue; will plan to assess/complete next session as appropriate          Wheelchair Mobility     Tilt Bed    Modified Rankin (Stroke Patients Only)       Balance Overall balance assessment: Needs assistance Sitting-balance support: No upper extremity supported, Feet supported Sitting balance-Leahy Scale: Good     Standing balance support: Bilateral upper extremity supported Standing balance-Leahy Scale: Fair                               Pertinent Vitals/Pain Pain Assessment Pain Assessment: No/denies pain    Home Living Family/patient expects to be discharged to:: Private residence Living Arrangements: Children Available Help at Discharge: Family Type of  Home: House Home Access: Stairs to enter Entrance Stairs-Rails: Left Entrance Stairs-Number of Steps: 3-4   Home Layout: One level        Prior Function Prior Level of Function : Independent/Modified Independent             Mobility Comments: Mod indep for ADLs, household and community  mobilization; intermittent use of SPC for mobility (tends to use grocery cart in stores/longer distances). Denies fall history.       Extremity/Trunk Assessment   Upper Extremity Assessment Upper Extremity Assessment: Overall WFL for tasks assessed (grossly at least 4/5 throughout; no focal weakness, sensory or coordination deficits)    Lower Extremity Assessment Lower Extremity Assessment: Overall WFL for tasks assessed (grossly at least 4/5 throughout; no focal weakness, sensory or coordination deficits)       Communication      Cognition Arousal: Alert Behavior During Therapy: WFL for tasks assessed/performed Overall Cognitive Status: Within Functional Limits for tasks assessed                                          General Comments      Exercises Other Exercises Other Exercises: Toilet transfer, ambulatory with RW, close sup/mod indep; sit/stand from standard toilet with grab bar/RW, close sup/mod indep.   Assessment/Plan    PT Assessment Patient needs continued PT services  PT Problem List Decreased activity tolerance;Decreased balance;Decreased mobility;Decreased coordination;Decreased knowledge of use of DME;Decreased safety awareness;Decreased knowledge of precautions       PT Treatment Interventions DME instruction;Gait training;Stair training;Functional mobility training;Therapeutic activities;Therapeutic exercise;Balance training;Patient/family education    PT Goals (Current goals can be found in the Care Plan section)  Acute Rehab PT Goals Patient Stated Goal: to get all of this checked out PT Goal Formulation: With patient Time For Goal Achievement: 02/08/23 Potential to Achieve Goals: Good    Frequency Min 1X/week     Co-evaluation               AM-PAC PT "6 Clicks" Mobility  Outcome Measure Help needed turning from your back to your side while in a flat bed without using bedrails?: None Help needed moving from lying on  your back to sitting on the side of a flat bed without using bedrails?: None Help needed moving to and from a bed to a chair (including a wheelchair)?: None Help needed standing up from a chair using your arms (e.g., wheelchair or bedside chair)?: None Help needed to walk in hospital room?: A Little Help needed climbing 3-5 steps with a railing? : A Little 6 Click Score: 22    End of Session Equipment Utilized During Treatment: Gait belt Activity Tolerance: Patient tolerated treatment well Patient left: in bed;with call bell/phone within reach;with bed alarm set Nurse Communication: Mobility status PT Visit Diagnosis: Muscle weakness (generalized) (M62.81);Difficulty in walking, not elsewhere classified (R26.2);Other symptoms and signs involving the nervous system (R29.898)    Time: 1000-1018 PT Time Calculation (min) (ACUTE ONLY): 18 min   Charges:   PT Evaluation $PT Eval Low Complexity: 1 Low   PT General Charges $$ ACUTE PT VISIT: 1 Visit         Chastity Noland H. Manson Passey, PT, DPT, NCS 01/25/23, 10:34 AM [phone]

## 2023-01-25 NOTE — Progress Notes (Signed)
SLP Cancellation Note  Patient Details Name: Amanda Duke MRN: [id number] DOB: October 31, 1940   Cancelled treatment:       Reason Eval/Treat Not Completed: SLP screened, no needs identified, will sign off (SLP consult recevied and appreciated. Chart review completed. Imaging negative, thus far, negative for stroke. Most recent NIHSS=0. Spoke with pt. No speech/language/cognitive concerns.)  Clyde Canterbury, M.S., CCC-SLP Speech-Language Pathologist [facility] [phone] Arnette Felts)  Woodroe Chen 01/25/2023, 9:38 AM

## 2023-01-25 NOTE — Assessment & Plan Note (Addendum)
Patient reports hypoglycemic blood glucose episodes and is not on any diabetes medication Patient has never been diagnosed with diabetes despite multiple PCP checks D50, 1 amp as needed for low blood glucose

## 2023-01-25 NOTE — Assessment & Plan Note (Signed)
Status post hernia repair.

## 2023-01-25 NOTE — Consult Note (Signed)
[facility] TeleNeurology Consult Services   Patient Name:   Amanda Duke, Amanda Duke Date of Birth:   May 01, 1940 Identification Number:   MRN - [id number] Date of Service:   01/25/2023 06:41:07  Diagnosis:       G45.9 - Transient cerebral ischemic attack, unspecified  Impression:      Patient presents with transient R side weakness. Presentation is most suggestive of transient ischemic attack.   Symptoms have resolved, current NIHSS is 0 and patient is not a candidate for IV thrombolytic or NIR consideration. However, given her risk for near term stroke, I recommend admission for TIA workup to include MRI brain, echo, CTA head and neck, telemetry, BP control, lipid panel, A1c. If patient cannot get MRI, then repeat CTH in 24-48 hours. I also recommend starting asa for prophylaxis.  Our recommendations are outlined below.  Recommendations:        Stroke/Telemetry Floor       Neuro Checks       Bedside Swallow Eval       DVT Prophylaxis       IV Fluids, Normal Saline       Head of Bed 30 Degrees       Euglycemia and Avoid Hyperthermia (PRN Acetaminophen)       Initiate or continue Aspirin 325 MG daily       Antihypertensives PRN if Blood pressure is greater than 220/120 or there is a concern for End organ damage/contraindications for permissive HTN. If blood pressure is greater than 220/120 give labetalol PO or IV or Vasotec IV with a goal of 15% reduction in BP during the first 24 hours.       admit for TIA workup       start asa 325mg  load and continue 81mg  daily  Sign Out:       Discussed with Emergency Department Provider    ------------------------------------------------------------------------------  Advanced Imaging: Advanced Imaging Deferred because:  resolved, suspect TIA   Metrics: Last Known Well: 01/24/2023 22:30:00 [facility] Notification Time: 01/25/2023 06:41:07 Arrival Time: 01/25/2023 05:39:00 Stamp Time: 01/25/2023 06:41:07 Initial  Response Time: 01/25/2023 06:42:31 Symptoms: R side weakness. Initial patient interaction: 01/25/2023 06:50:00 NIHSS Assessment Completed: 01/25/2023 06:53:14 Patient is not a candidate for Thrombolytic. Thrombolytic Medical Decision: 01/25/2023 06:54:50 Patient was not deemed candidate for Thrombolytic because of following reasons: Resolved symptoms .  CT head showed no acute hemorrhage or acute core infarct.  Primary Provider Notified of Diagnostic Impression and Management Plan on: 01/25/2023 07:11:37    ------------------------------------------------------------------------------  History of Present Illness: Patient is a 82 year old Female.  Patient was brought by EMS for symptoms of R side weakness. She came in with HTN and R weakness. LKW was 2230 when she went to bed. She woke up at 4am and went to the bathroom and her R arm felt like she couldn't move it. Then her leg was also weak and couldn't walk. There was no numbness. It lasted a few minutes. BP is better now. Her R arm and leg weakness is resolved. She has never had symptoms like this before. She had a headache when she went to bed. She also endorses chest pain in the last week. She denies any weakness.   Past Medical History:      Hypertension      Hyperlipidemia      Coronary Artery Disease      There is no history of Stroke      There is no history of Migraine Headaches Other PMH:  hematuria  IBS  anxiety  SVT  spinal surgery  Medications:  No Anticoagulant use  No Antiplatelet use Reviewed EMR for current medications  Allergies:  Reviewed  Social History: Smoking: No  Family History:  There is no family history of premature cerebrovascular disease pertinent to this consultation  ROS : 14 Points Review of Systems was performed and was negative except mentioned in HPI.  Past Surgical History: There Is No Surgical History Contributory To Today's Visit    Examination: BP(161/85),  Pulse(78), 1A: Level of Consciousness - Alert; keenly responsive + 0 1B: Ask Month and Age - Both Questions Right + 0 1C: Blink Eyes & Squeeze Hands - Performs Both Tasks + 0 2: Test Horizontal Extraocular Movements - Normal + 0 3: Test Visual Fields - No Visual Loss + 0 4: Test Facial Palsy (Use Grimace if Obtunded) - Normal symmetry + 0 5A: Test Left Arm Motor Drift - No Drift for 10 Seconds + 0 5B: Test Right Arm Motor Drift - No Drift for 10 Seconds + 0 6A: Test Left Leg Motor Drift - No Drift for 5 Seconds + 0 6B: Test Right Leg Motor Drift - No Drift for 5 Seconds + 0 7: Test Limb Ataxia (FNF/Heel-Shin) - No Ataxia + 0 8: Test Sensation - Normal; No sensory loss + 0 9: Test Language/Aphasia - Normal; No aphasia + 0 10: Test Dysarthria - Normal + 0 11: Test Extinction/Inattention - No abnormality + 0  NIHSS Score: 0   Pre-Morbid Modified Rankin Scale: 1 Points = No significant disability despite symptoms; able to carry out all usual duties and activities  Spoke with : Dr York Cerise  This consult was conducted in real time using interactive audio and Immunologist. Patient was informed of the technology being used for this visit and agreed to proceed. Patient located in hospital and provider located at home/office setting.   Patient is being evaluated for possible acute neurologic impairment and high probability of imminent or life-threatening deterioration. I spent total of 35 minutes providing care to this patient, including time for face to face visit via telemedicine, review of medical records, imaging studies and discussion of findings with providers, the patient and/or family.   Dr Lynnda Child   [facility] For Inpatient follow-up with [facility] physician please call [facility] [phone]. This is not an outpatient service. Post hospital discharge, please contact hospital directly.  Please do not communicate with [facility] physicians via secure chat. If you  have any questions, Please contact [facility]. Please call or reconsult our service if there are any clinical or diagnostic changes.

## 2023-01-25 NOTE — ED Provider Notes (Signed)
[facility] Provider Note    Event Date/Time   First MD Initiated Contact with Patient 01/25/23 [phone]     (approximate)   History   Extremity Weakness   HPI Amanda Duke is a 82 y.o. female with extensive chronic medical issues but no prior history of CVA or TIA.  She presents by private vehicle for evaluation of acute onset right arm and right leg weakness with right arm numbness.  The history is a little bit unclear and vague, but she says that she did not have any other weakness or numbness when she went to bed last night sometime after 11 PM.  She woke up at some time between 4 AM and 5 AM, and after going to the bathroom she was not able to go back to sleep and then all of a sudden she felt like her right arm was weak and numb.  When she tried to walk she felt like her right leg was not cooperating as well as usual.  The exact timeframe is a bit unclear, but it seems that the symptoms did not occur when she first woke up at 4 AM to 5 AM, but rather occurred after she was already awake, sometime around 5 AM.  She reports feeling poorly in general but cannot provide any other specific details about how she is not feeling well.  No nausea or vomiting.  No chest pain or shortness of breath although she said that her chest felt "funny" at some point yesterday evening.  She does not take blood thinners.  She took her blood pressure medicine at home.     Physical Exam   ED Triage Vitals  Encounter Vitals Group     BP 01/25/23 0550 (!) 195/89     Systolic BP Percentile --      Diastolic BP Percentile --      Pulse Rate 01/25/23 0550 66     Resp 01/25/23 0550 16     Temp 01/25/23 0550 97.8 F (36.6 C)     Temp Source 01/25/23 0550 Oral     SpO2 --      Weight 01/25/23 0550 74.1 kg (163 lb 6.4 oz)     Height 01/25/23 0550 1.651 m (5\' 5" )     Head Circumference --      Peak Flow --      Pain Score 01/25/23 0551 0     Pain Loc --      Pain Education --       Exclude from Growth Chart --      Most recent vital signs: Vitals:   01/25/23 0630 01/25/23 0645  BP: (!) 149/72   Pulse: (!) 58 (!) 58  Resp: 14 12  Temp:    SpO2: 97% 96%    General: Awake, alert, no obvious distress. CV:  Good peripheral perfusion.  Regular rate and rhythm. Resp:  Normal effort. Speaking easily and comfortably, no accessory muscle usage nor intercostal retractions.  No tenderness to palpation. Abd:  No distention.  No tenderness to palpation the abdomen. Neuro:  No obvious CN deficits.  No dysarthria nor aphasia.  Patient reports subjective decrease sensation in her right arm and right leg.  She reports not being able to move her right leg as well as usual but she is still moving her right arm.  NIH Stroke Scale  Interval: Baseline Time: 5:50 AM Person Administering Scale: Loleta Rose  Administer stroke scale items in the order listed.  Record performance in each category after each subscale exam. Do not go back and change scores. Follow directions provided for each exam technique. Scores should reflect what the patient does, not what the clinician thinks the patient can do. The clinician should record answers while administering the exam and work quickly. Except where indicated, the patient should not be coached (i.e., repeated requests to patient to make a special effort).   1a  Level of consciousness: 0=alert; keenly responsive  1b. LOC questions:  0=Performs both tasks correctly  1c. LOC commands: 0=Performs both tasks correctly  2.  Best Gaze: 0=normal  3.  Visual: 0=No visual loss  4. Facial Palsy: 0=Normal symmetric movement  5a.  Motor left arm: 0=No drift, limb holds 90 (or 45) degrees for full 10 seconds  5b.  Motor right arm: 0=No drift, limb holds 90 (or 45) degrees for full 10 seconds  6a. motor left leg: 0=No drift, limb holds 90 (or 45) degrees for full 10 seconds  6b  Motor right leg:  1=Drift, limb holds 90 (or 45) degrees but drifts down  before full 10 seconds: does not hit bed  7. Limb Ataxia: 0=Absent  8.  Sensory: 1=Mild to moderate sensory loss; patient feels pinprick is less sharp or is dull on the affected side; there is a loss of superficial pain with pinprick but patient is aware She is being touched  9. Best Language:  0=No aphasia, normal  10. Dysarthria: 0=Normal  11. Extinction and Inattention: 0=No abnormality  12. Distal motor function: 0=Normal   Total:    2      ED Results / Procedures / Treatments   Labs (all labs ordered are listed, but only abnormal results are displayed) Labs Reviewed  COMPREHENSIVE METABOLIC PANEL - Abnormal; Notable for the following components:      Result Value   Glucose, Bld 120 (*)    GFR, Estimated 56 (*)    All other components within normal limits  CBG MONITORING, ED - Abnormal; Notable for the following components:   Glucose-Capillary 104 (*)    All other components within normal limits  PROTIME-INR  APTT  CBC  DIFFERENTIAL  ETHANOL     EKG  ED ECG REPORT I, Loleta Rose, the attending physician, personally viewed and interpreted this ECG.  Date: 01/25/2023 EKG Time: 5:51 AM Rate: 71 Rhythm: normal sinus rhythm QRS Axis: normal Intervals: normal ST/T Wave abnormalities: normal Narrative Interpretation: no evidence of acute ischemia    RADIOLOGY See hospital course for details: I viewed and interpreted her head CT and see no sign of acute hemorrhage or obvious stroke.   PROCEDURES:  Critical Care performed: Yes, see critical care procedure note(s)  .Critical Care  Performed by: Loleta Rose, MD Authorized by: Loleta Rose, MD   Critical care provider statement:    Critical care time (minutes):  30   Critical care time was exclusive of:  Separately billable procedures and treating other patients   Critical care was necessary to treat or prevent imminent or life-threatening deterioration of the following conditions:  CNS failure or  compromise   Critical care was time spent personally by me on the following activities:  Development of treatment plan with patient or surrogate, evaluation of patient's response to treatment, examination of patient, obtaining history from patient or surrogate, ordering and performing treatments and interventions, ordering and review of laboratory studies, ordering and review of radiographic studies, pulse oximetry, re-evaluation of patient's condition and review of old charts .1-3  Lead EKG Interpretation  Performed by: Loleta Rose, MD Authorized by: Loleta Rose, MD     Interpretation: normal     ECG rate:  60   ECG rate assessment: normal     Rhythm: sinus rhythm     Ectopy: none     Conduction: normal       IMPRESSION / MDM / ASSESSMENT AND PLAN / ED COURSE  I reviewed the triage vital signs and the nursing notes.                              Differential diagnosis includes, but is not limited to, TIA/CVA, acute intracranial hemorrhage, electrolyte or metabolic abnormality, medication or drug side effect.  Patient's presentation is most consistent with acute presentation with potential threat to life or bodily function.  Labs/studies ordered: Ethanol level to look for other explanations for her apparent neurological deficit, CMP, APTT, pro time-INR (again according to stroke protocol), CBC with differential, CT head   Interventions/Medications given:  Medications  aspirin chewable tablet 324 mg (has no administration in time range)    (Note:  hospital course my include additional interventions and/or labs/studies not listed above.)   The exact onset of symptoms is somewhat unclear but she may be within a thrombolytic treatment window if in fact her symptoms started after she woke up this morning.  Symptoms are very mild and I doubt she would benefit from [facility], but I will defer to neurology evaluation.  I initiated code stroke and order the appropriate lab work and imaging.   Nursing will move forward with using the teleneurology cart to start the assessment.  The patient is on the cardiac monitor to evaluate for evidence of arrhythmia and/or significant heart rate changes.   Clinical Course as of 01/25/23 [id number]  Sat Jan 25, 2023  0621 I received a phone call from Dr. Margo Aye with radiology who confirmed no acute findings on the code stroke CT scan.  I subsequently personally viewed and interpreted the scan and I also see no evidence of acute abnormality. [CF]  [phone] Code stroke activated - baseline at 11p, woke up 4-5a R sided arm weakness/numbness. NIH 2. Ct head negative. Neuro recs pending.  [SM]  [phone] Consulted by phone with the neurologist that evaluated the patient.  He feels that TIA is most likely and recommends hospitalization for full stroke workup.  He recommended 324 mg p.o. aspirin which I ordered and the patient has passed the stroke swallow screen.  Consulting hospitalist for admission. [CF]    Clinical Course User Index [CF] Loleta Rose, MD [SM] Corena Herter, MD     FINAL CLINICAL IMPRESSION(S) / ED DIAGNOSES   Final diagnoses:  TIA (transient ischemic attack)     Rx / DC Orders   ED Discharge Orders     None        Note:  This document was prepared using Dragon voice recognition software and may include unintentional dictation errors.   Loleta Rose, MD 01/25/23 [phone]

## 2023-01-25 NOTE — Assessment & Plan Note (Signed)
Patient has prolapsed bladder and states she has frequent UTI Check a UA on admission

## 2023-01-26 ENCOUNTER — Observation Stay: Payer: 59

## 2023-01-26 DIAGNOSIS — R2990 Unspecified symptoms and signs involving the nervous system: Secondary | ICD-10-CM | POA: Diagnosis not present

## 2023-01-26 DIAGNOSIS — I672 Cerebral atherosclerosis: Secondary | ICD-10-CM | POA: Diagnosis not present

## 2023-01-26 DIAGNOSIS — I6523 Occlusion and stenosis of bilateral carotid arteries: Secondary | ICD-10-CM | POA: Diagnosis not present

## 2023-01-26 DIAGNOSIS — I708 Atherosclerosis of other arteries: Secondary | ICD-10-CM | POA: Diagnosis not present

## 2023-01-26 DIAGNOSIS — I6509 Occlusion and stenosis of unspecified vertebral artery: Secondary | ICD-10-CM | POA: Diagnosis not present

## 2023-01-26 DIAGNOSIS — R29818 Other symptoms and signs involving the nervous system: Secondary | ICD-10-CM | POA: Diagnosis not present

## 2023-01-26 LAB — HEMOGLOBIN A1C
Hgb A1c MFr Bld: 5.8 % — ABNORMAL HIGH (ref 4.8–5.6)
Mean Plasma Glucose: 119.76 mg/dL

## 2023-01-26 LAB — ECHOCARDIOGRAM COMPLETE
AR max vel: 1.88 cm2
AV Peak grad: 13.5 mm[Hg]
Ao pk vel: 1.84 m/s
Area-P 1/2: 2.26 cm2
Height: 65 in
P 1/2 time: 624 ms
S' Lateral: 2.1 cm
Weight: 2614.4 [oz_av]

## 2023-01-26 MED ORDER — IOHEXOL 350 MG/ML SOLN
75.0000 mL | Freq: Once | INTRAVENOUS | Status: AC | PRN
  Administered 2023-01-26: 75 mL via INTRAVENOUS

## 2023-01-26 MED ORDER — CLOPIDOGREL BISULFATE 75 MG PO TABS: 75.0000 mg | ORAL_TABLET | Freq: Every day | ORAL | 0 refills | Status: DC

## 2023-01-26 MED ORDER — SENNOSIDES-DOCUSATE SODIUM 8.6-50 MG PO TABS: 1.0000 | ORAL_TABLET | Freq: Every evening | ORAL | 0 refills | Status: DC | PRN

## 2023-01-26 NOTE — Progress Notes (Signed)
Pt received from CT via bed in stable condition.

## 2023-01-26 NOTE — Plan of Care (Signed)
Neurology chart review note  Signed off by Dr. Iver Nestle to review the chart for final set of recommendations.  -2D echocardiogram: LVEF 65-70%, LA normal.  -CT head and CTA head and neck reviewed and agree with rads interpretation IMPRESSION: 1. Negative for large vessel occlusion. Stable CT appearance of the brain, negative aside from small chronic appearing right caudate lacunar infarct.  2. Generally mild for age extracranial atherosclerosis (see #4). Right > Left ICA origin and bulb involvement without hemodynamically significant stenosis. 3. No significant ICA siphon or posterior circulation atherosclerosis. Calcified plaque at the origin of th dominant Right ACA A2 with up to moderate stenosis there. 4. Bulky calcified plaque at the Right Subclavian Artery origin with estimated 75% stenosis. Less pronounced proximal left subclavian artery plaque. Aortic Atherosclerosis (ICD10-I70.0).   -A1c 5.8 (goal <7.0)  -Lipid panel - LDL 131 (goal <70)   IMPRESSION -Concern for left hemispheric stroke v TIA (patient refused MRI). Repeat head CT stable appearance. CTA head nad neck with no evidence of LVO. -Rt subclavian artery stenosis - does not correlate with symptoms  Recs: Continue DAPT x 3 weeks followed by ASA only Hight intensity statin for goal LDL <70. Outpatient neurology follow up in 8-12 weeks. Plan relayed to Dr. Meriam Sprague  -- Milon Dikes, MD Neurologist [facility] Pager: [phone]

## 2023-01-26 NOTE — Progress Notes (Signed)
OT Cancellation Note  Patient Details Name: Amanda Duke MRN: [id number] DOB: Sep 28, 1940   Cancelled Treatment:    Reason Eval/Treat Not Completed: OT screened, no needs identified, will sign off. Duplicate orders received. Pt evaluated by OT this admission and signed off as pt is near functional baseline for ADLs and mobility with strong family support. Please see 01/25/23 evaluation for recommendations. Pt ontinues to deny OT needs, will sign off. Thank you.   Kathie Dike, M.S. OTR/L  01/26/23, 10:24 AM  ascom [phone]

## 2023-01-26 NOTE — TOC CM/SW Note (Addendum)
Transition of Care [facility]) - Inpatient Brief Assessment   Patient Details  Name: Amanda Duke MRN: [id number] Date of Birth: Jun 06, 1940  Transition of [facility] CM/SW Contact:    Bing Quarry, RN Phone Number: 01/26/2023, 12:31 PM   Clinical Narrative: 9/29: DC pending today. Admitted via ED 01/25/23 with RIGHT sided weakness.  OBS status. R/O TIA vs CVA. LKW at 11 pm the night before presentation. DC to home with [facility] recommendations per PT evaluation. NIHSS 0 now per notes. Unable to reach patient, echocardiogram in progress. Reached out to son as patient lives with adult children. He will contact patient regarding if the want [facility] PT/OT on discharge. Patient has a RW at home. PCP confirmed in chart. Ins. [employer]. DC today post echocardiogram report/HH agency pending acceptance due to [facility] insurance.  PCP: Estil Daft [phone].  RX: [id number] [place], [place] - [address] [[id number]]   Gabriel Cirri MSN RN CM  Transitions of Care Department [facility] [phone] Weekends Only   225 pm: Update: [facility] accepted for [facility] PT/OT and RN CM spoke with patient via phone call and patient was agreeable.   Gabriel Cirri MSN RN CM  Transitions of Care Department [facility] [phone] Weekends Only    Transition of Care Asessment: Insurance and Status: Insurance coverage has been reviewed Patient has primary care physician: Yes Home environment has been reviewed: Lives in single level home with children with 3 steps inot house. Prior level of function:: Ind/Mod I with no recent falls. Has a RW but does not always use it per PT notes. Prior/Current Home Services: No current home services   Readmission risk has been reviewed: No (OBS status at 29 hours. NO RA score calculated. Positive for TIA.) Transition of care needs: transition of care needs identified, TOC will continue to follow

## 2023-01-26 NOTE — Progress Notes (Signed)
Patient sent for CT in stable condition.

## 2023-01-26 NOTE — Plan of Care (Signed)
Patient is alert and oriented x 4. Discharge instruction given. No any concerns or questions at this time.  Problem: Education: Goal: Knowledge of disease or condition will improve Outcome: Completed/Met Goal: Knowledge of secondary prevention will improve (MUST DOCUMENT ALL) Outcome: Completed/Met Goal: Knowledge of patient specific risk factors will improve Loraine Leriche N/A or DELETE if not current risk factor) Outcome: Completed/Met   Problem: Ischemic Stroke/TIA Tissue Perfusion: Goal: Complications of ischemic stroke/TIA will be minimized Outcome: Completed/Met   Problem: Coping: Goal: Will verbalize positive feelings about self Outcome: Completed/Met Goal: Will identify appropriate support needs Outcome: Completed/Met   Problem: Health Behavior/Discharge Planning: Goal: Ability to manage health-related needs will improve Outcome: Completed/Met Goal: Goals will be collaboratively established with patient/family Outcome: Completed/Met   Problem: Self-Care: Goal: Ability to participate in self-care as condition permits will improve Outcome: Completed/Met Goal: Verbalization of feelings and concerns over difficulty with self-care will improve Outcome: Completed/Met Goal: Ability to communicate needs accurately will improve Outcome: Completed/Met   Problem: Nutrition: Goal: Risk of aspiration will decrease Outcome: Completed/Met Goal: Dietary intake will improve Outcome: Completed/Met   Problem: Education: Goal: Knowledge of General Education information will improve Description: Including pain rating scale, medication(s)/side effects and non-pharmacologic comfort measures Outcome: Completed/Met   Problem: Health Behavior/Discharge Planning: Goal: Ability to manage health-related needs will improve Outcome: Completed/Met   Problem: Clinical Measurements: Goal: Ability to maintain clinical measurements within normal limits will improve Outcome: Completed/Met Goal: Will  remain free from infection Outcome: Completed/Met Goal: Diagnostic test results will improve Outcome: Completed/Met Goal: Respiratory complications will improve Outcome: Completed/Met Goal: Cardiovascular complication will be avoided Outcome: Completed/Met   Problem: Activity: Goal: Risk for activity intolerance will decrease Outcome: Completed/Met   Problem: Nutrition: Goal: Adequate nutrition will be maintained Outcome: Completed/Met   Problem: Coping: Goal: Level of anxiety will decrease Outcome: Completed/Met   Problem: Elimination: Goal: Will not experience complications related to bowel motility Outcome: Completed/Met Goal: Will not experience complications related to urinary retention Outcome: Completed/Met   Problem: Pain Managment: Goal: General experience of comfort will improve Outcome: Completed/Met   Problem: Safety: Goal: Ability to remain free from injury will improve Outcome: Completed/Met   Problem: Skin Integrity: Goal: Risk for impaired skin integrity will decrease Outcome: Completed/Met

## 2023-01-26 NOTE — Discharge Summary (Signed)
Physician Discharge Summary   Patient: Amanda Duke MRN: [id number] DOB: 1941/02/13  Admit date:     01/25/2023  Discharge date: 01/26/23  Discharge Physician: Loyce Dys   PCP: Carlean Jews, PA-C   Recommendations at discharge:  Follow-up with primary care physician and neurologist in 8 to 12 weeks   Discharge Diagnoses: TIA, acute CVA ruled out Hypertensive disorder Dyslipidemia Hiatal hernia with GERD Dysuria, urinalysis within normal limit Impaired fasting glucose Chronic kidney disease, stage 2 (mild) Acid reflux    Hospital Course: Amanda Duke is a 82 year old female with history of hypertension, anxiety, hyperlipidemia, GERD, who presents emergency department for chief concerns of right-sided weakness and numbness.  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  This was discussed with neurologist and since patient is asymptomatic no additional recommendation needed.  Patient also underwent echocardiogram.  PT OT saw patient and have recommended discharge home with home health.  Patient will follow-up with primary care physician.  Consultants: Neurology Procedures performed: None Disposition: Home health Diet recommendation:  Cardiac diet DISCHARGE MEDICATION: Allergies as of 01/26/2023       Reactions   Amoxicillin Hives   Patient tolerated Ceftriaxone in ED on 01/10/19.    Clindamycin Diarrhea   Gemfibrozil Other (See Comments)   muscle ache   Levofloxacin Other (See Comments)   Muscle ache for several months   Metoprolol Other (See Comments)   "Lowers blood pressure extremely low"   Simvastatin Other (See Comments)   muscle ache   Macrobid [nitrofurantoin Macrocrystal] Diarrhea   Niacin Rash   Nystatin Rash   Nystatin-triamcinolone Rash   Sulfa Antibiotics Rash   Family history of medication allergy        Medication List      STOP taking these medications    carvedilol 3.125 MG tablet Commonly known as: COREG       TAKE these medications    acetaminophen 500 MG tablet Commonly known as: TYLENOL Take 1,000 mg by mouth every 6 (six) hours as needed for moderate pain.   aspirin EC 81 MG tablet Take 81 mg by mouth daily. Swallow whole.   cholecalciferol 1000 units tablet Commonly known as: VITAMIN D Take 1,000 Units by mouth daily.   clonazePAM 0.5 MG tablet Commonly known as: KLONOPIN TAKE 1 TABLET (0.5 MG TOTAL) BY MOUTH AS NEEDED FOR ANXIETY. TAKE 1 TABLET BY MOUTH AS NEEDED   clopidogrel 75 MG tablet Commonly known as: Plavix Take 1 tablet (75 mg total) by mouth daily.   diltiazem 180 MG 24 hr capsule Commonly known as: CARDIZEM CD TAKE 1 CAPSULE BY MOUTH EVERY DAY   fluticasone 50 MCG/ACT nasal spray Commonly known as: FLONASE SPRAY 1 SPRAY INTO BOTH NOSTRILS DAILY.   icosapent Ethyl 1 g capsule Commonly known as: VASCEPA Take 2 g by mouth 2 (two) times daily.   linaclotide 290 MCG Caps capsule Commonly known as: Linzess Take 1 capsule (290 mcg total) by mouth daily. What changed: when to take this   losartan 100 MG tablet Commonly known as: COZAAR Take 100 mg by mouth every morning.   ondansetron 4 MG disintegrating tablet Commonly known as: ZOFRAN-ODT Take 1 tablet (4 mg total) by mouth every 8 (eight) hours as needed. What changed: reasons to take this   pantoprazole 40 MG tablet Commonly known as: PROTONIX Take 40 mg by mouth as  needed.   rosuvastatin 40 MG tablet Commonly known as: CRESTOR Take 40 mg by mouth at bedtime.   senna-docusate 8.6-50 MG tablet Commonly known as: Senokot-S Take 1 tablet by mouth at bedtime as needed for mild constipation.   solifenacin 5 MG tablet Commonly known as: VESICARE TAKE 1 TABLET (5 MG TOTAL) BY MOUTH DAILY. What changed:  when to take this reasons to take this        Discharge Exam: Filed Weights   01/25/23  0550  Weight: 74.1 kg   General: In no acute distress Neck: normal, supple, no masses, no thyromegaly Respiratory: clear to auscultation bilaterally, no wheezing, no crackles. Normal respiratory effort. No accessory muscle use.  Cardiovascular: Regular rate and rhythm, no murmurs / rubs / gallops. No extremity edema. 2+ pedal pulses. No carotid bruits.  Abdomen: no tenderness, no masses palpated, no hepatosplenomegaly. Bowel sounds positive.  Musculoskeletal: no clubbing / cyanosis. No joint deformity upper and lower extremities. Good ROM, no contractures, no atrophy. Normal muscle tone.  Skin: no rashes, lesions, ulcers. No induration Neurologic: Sensation intact. Strength 5/5 in all 4.  Psychiatric: Normal judgment and insight. Alert and oriented x 3. Normal mood.     Condition at discharge: good  The results of significant diagnostics from this hospitalization (including imaging, microbiology, ancillary and laboratory) are listed below for reference.   Imaging Studies: CT ANGIO HEAD NECK W WO CM  Result Date: 01/26/2023 CLINICAL DATA:  82 year old female code stroke presentation. Right side deficits. EXAM: CT ANGIOGRAPHY HEAD AND NECK WITH AND WITHOUT CONTRAST TECHNIQUE: Multidetector CT imaging of the head and neck was performed using the standard protocol during bolus administration of intravenous contrast. Multiplanar CT image reconstructions and MIPs were obtained to evaluate the vascular anatomy. Carotid stenosis measurements (when applicable) are obtained utilizing NASCET criteria, using the distal internal carotid diameter as the denominator. RADIATION DOSE REDUCTION: This exam was performed according to the departmental dose-optimization program which includes automated exposure control, adjustment of the mA and/or kV according to patient size and/or use of iterative reconstruction technique. CONTRAST:  75mL OMNIPAQUE IOHEXOL 350 MG/ML SOLN COMPARISON:  Plain head CT yesterday.  FINDINGS: CT HEAD Brain: Normal cerebral volume for age. No midline shift, ventriculomegaly, mass effect, evidence of mass lesion, intracranial hemorrhage or evidence of cortically based acute infarction. Gray-white differentiation appears stable and largely normal for age. There is a subtle chronic right caudate lacunar infarct which is more conspicuous today on series 4, image 16, but appears stable. Calvarium and skull base: Negative. Paranasal sinuses: Visualized paranasal sinuses and mastoids are stable and well aerated. Orbits: Visualized orbits and scalp soft tissues are within normal limits. CTA NECK Skeleton: Carious dentition. Mild for age cervical spine degeneration. No acute osseous abnormality identified. Upper chest: Negative. Other neck: Negative. Aortic arch: Calcified aortic atherosclerosis. Three vessel arch configuration. Right carotid system: Minimal brachiocephalic artery and right CCA origin plaque without stenosis. Tortuous proximal right CCA, partially retropharyngeal course. Right ICA origin soft and calcified plaque is mild-to-moderate. Less than 50 % stenosis with respect to the distal vessel. Left carotid system: Minimal left CCA origin plaque without stenosis. Partially retropharyngeal course of the left CCA. Calcified plaque at the left ICA origin and bulb with less than 50 % stenosis with respect to the distal vessel. Vertebral arteries: Calcified right subclavian artery origin with up to 75 % stenosis with respect to the distal vessel. Right subclavian remains patent. Normal right vertebral artery origin. Patent right vertebral artery to the  skull base with no significant plaque or stenosis. Proximal left subclavian artery combined soft and calcified plaque. Less than 50 % stenosis with respect to the distal vessel. Normal left vertebral artery origin. Mildly dominant left vertebral artery. Patent left vertebral artery to the skull base with no significant plaque or stenosis. CTA HEAD  Posterior circulation: Mildly dominant left V4 segment. Normal PICA origins. Patent distal vertebral arteries and vertebrobasilar junction with no significant plaque or stenosis. Patent basilar artery without stenosis. Patent SCA and PCA origins. Right posterior communicating artery is present, the left is diminutive or absent. Bilateral PCA branches are patent, only mild PCA irregularity. Anterior circulation: Both ICA siphons are patent. Minimal left siphon plaque without stenosis. Minimal right siphon plaque without stenosis. Normal right posterior communicating artery origin. Patent carotid termini, MCA and ACA origins. Mildly dominant left ACA A1. Normal anterior communicating artery. Calcified plaque at the right ACA A2 origin, also evident on plain head CT (series 9, image 208). And the right A2 appears dominant. Stenosis there could be moderate (series 11, image 102). But the right A2 remains dominant. Otherwise ACA branches are within normal limits. Left MCA M1 segment and bifurcation are patent without stenosis. Right MCA M1 segment and trifurcation are patent without stenosis. Bilateral MCA branches are within normal limits. Venous sinuses: Early contrast timing, grossly patent. Anatomic variants: Dominant left vertebral artery. Dominant left A1 but dominant right A2 ACA segments. Review of the MIP images confirms the above findings IMPRESSION: 1. Negative for large vessel occlusion. Stable CT appearance of the brain, negative aside from small chronic appearing right caudate lacunar infarct. 2. Generally mild for age extracranial atherosclerosis (see #4). Right > Left ICA origin and bulb involvement without hemodynamically significant stenosis. 3. No significant ICA siphon or posterior circulation atherosclerosis. Calcified plaque at the origin of the dominant Right ACA A2 with up to moderate stenosis there. 4. Bulky calcified plaque at the Right Subclavian Artery origin with estimated 75% stenosis. Less  pronounced proximal left subclavian artery plaque. Aortic Atherosclerosis (ICD10-I70.0). Electronically Signed   By: Odessa Fleming M.D.   On: 01/26/2023 12:57   CT HEAD CODE STROKE WO CONTRAST  Addendum Date: 01/25/2023   ADDENDUM REPORT: 01/25/2023 06:19 ADDENDUM: Study discussed by telephone with Dr. Loleta Rose on 01/25/2023 at 0607 hours. Electronically Signed   By: Odessa Fleming M.D.   On: 01/25/2023 06:19   Result Date: 01/25/2023 CLINICAL DATA:  Code stroke. 82 year old female with right side deficits. EXAM: CT HEAD WITHOUT CONTRAST TECHNIQUE: Contiguous axial images were obtained from the base of the skull through the vertex without intravenous contrast. RADIATION DOSE REDUCTION: This exam was performed according to the departmental dose-optimization program which includes automated exposure control, adjustment of the mA and/or kV according to patient size and/or use of iterative reconstruction technique. COMPARISON:  Report of head CT 07/15/1998 (no images available). FINDINGS: Brain: Cerebral volume is within normal limits for age. No midline shift, mass effect, or evidence of intracranial mass lesion. No acute intracranial hemorrhage identified. No ventriculomegaly. Gray-white differentiation appears normal for age, symmetric. No acute or chronic cortically based infarct identified. Vascular: No suspicious intracranial vascular hyperdensity. Minimal Calcified atherosclerosis at the skull base. Skull: Intact.  No acute osseous abnormality identified. Sinuses/Orbits: Minimal bubbly opacity in the left sphenoid, visualized paranasal sinuses and mastoids are well aerated. Other: No gaze deviation. Visualized orbits and scalp soft tissues are within normal limits. ASPECTS [facility] Stroke Program Early CT Score) Total score (0-10 with 10 being normal):  10 IMPRESSION: Normal for age noncontrast CT appearance of the brain.  ASPECTS 10. Electronically Signed: By: Odessa Fleming M.D. On: 01/25/2023 06:06     Microbiology: Results for orders placed or performed during the hospital encounter of 11/06/20  Urine Culture     Status: Abnormal   Collection Time: 11/03/20  1:28 PM   Specimen: Urine, Random  Result Value Ref Range Status   Specimen Description   Final    URINE, RANDOM Performed at [facility], [address], [place], [place] [address]    Special Requests   Final    NONE Performed at [facility], [address] [place], [place] [address]    Culture >=100,000 COLONIES/mL CITROBACTER FREUNDII (A)  Final   Report Status 11/05/2020 FINAL  Final   Organism ID, Bacteria CITROBACTER FREUNDII (A)  Final      Susceptibility   Citrobacter freundii - MIC*    CEFAZOLIN >=64 RESISTANT Resistant     CEFEPIME <=0.12 SENSITIVE Sensitive     CEFTRIAXONE 0.5 SENSITIVE Sensitive     CIPROFLOXACIN 0.5 SENSITIVE Sensitive     GENTAMICIN >=16 RESISTANT Resistant     IMIPENEM <=0.25 SENSITIVE Sensitive     NITROFURANTOIN <=16 SENSITIVE Sensitive     TRIMETH/SULFA >=320 RESISTANT Resistant     PIP/TAZO <=4 SENSITIVE Sensitive     * >=100,000 COLONIES/mL CITROBACTER FREUNDII    Labs: CBC: Recent Labs  Lab 01/25/23 0553  WBC 6.8  NEUTROABS 2.8  HGB 14.3  HCT 42.9  MCV 86.7  PLT 305   Basic Metabolic Panel: Recent Labs  Lab 01/25/23 0553  NA 138  K 3.5  CL 105  CO2 23  GLUCOSE 120*  BUN 16  CREATININE 1.00  CALCIUM 9.0   Liver Function Tests: Recent Labs  Lab 01/25/23 0553  AST 22  ALT 13  ALKPHOS 85  BILITOT 0.6  PROT 7.6  ALBUMIN 4.1   CBG: Recent Labs  Lab 01/25/23 0544  GLUCAP 104*    Discharge time spent:  35 minutes.  Signed: Loyce Dys, MD [facility] 01/26/2023

## 2023-01-26 NOTE — TOC Transition Note (Signed)
Transition of Care [facility]) - CM/SW Discharge Note   Patient Details  Name: Amanda Duke MRN: [id number] Date of Birth: [birth year]  Transition of Care [facility]) CM/SW Contact:  Bing Quarry, RN Phone Number: 01/26/2023, 2:29 PM   Clinical Narrative: 01/26/23: Patient is pending DC to home with [facility] services via [facility]. Pending completion and results of ordered echocardiogram. See prior notes for other details. Please reach out to [facility] if any issues arise prior to actual discharge.   Gabriel Cirri MSN RN CM  Transitions of Care Department [facility] [phone] Weekends Only       Final next level of care: Home w Home Health Services     Patient Goals and CMS Choice      Discharge Placement                         Discharge Plan and Services Additional resources added to the After Visit Summary for                            [facility] Arranged: PT, OT HH Agency: [facility] Services        Social Determinants of Health (SDOH) Interventions SDOH Screenings   Food Insecurity: No Food Insecurity (01/25/2023)  Housing: Patient Declined (01/25/2023)  Transportation Needs: No Transportation Needs (01/25/2023)  Utilities: Not At Risk (01/25/2023)  Alcohol Screen: Low Risk  (10/06/2020)  Depression (PHQ2-9): Low Risk  (11/14/2022)  Financial Resource Strain: Low Risk  (11/17/2020)  Physical Activity: Inactive (01/10/2019)  Social Connections: Socially Isolated (01/10/2019)  Stress: No Stress Concern Present (01/10/2019)  Tobacco Use: Medium Risk (01/25/2023)     Readmission Risk Interventions     No data to display

## 2023-01-26 NOTE — Plan of Care (Signed)
  Problem: Education: Goal: Knowledge of disease or condition will improve Outcome: Progressing Goal: Knowledge of secondary prevention will improve (MUST DOCUMENT ALL) Outcome: Progressing   Problem: Ischemic Stroke/TIA Tissue Perfusion: Goal: Complications of ischemic stroke/TIA will be minimized Outcome: Progressing   Problem: Coping: Goal: Will verbalize positive feelings about self Outcome: Progressing Goal: Will identify appropriate support needs Outcome: Progressing   Problem: Self-Care: Goal: Ability to participate in self-care as condition permits will improve Outcome: Progressing Goal: Verbalization of feelings and concerns over difficulty with self-care will improve Outcome: Progressing

## 2023-01-29 DIAGNOSIS — K219 Gastro-esophageal reflux disease without esophagitis: Secondary | ICD-10-CM | POA: Diagnosis not present

## 2023-01-29 DIAGNOSIS — Z8673 Personal history of transient ischemic attack (TIA), and cerebral infarction without residual deficits: Secondary | ICD-10-CM | POA: Diagnosis not present

## 2023-01-29 DIAGNOSIS — I129 Hypertensive chronic kidney disease with stage 1 through stage 4 chronic kidney disease, or unspecified chronic kidney disease: Secondary | ICD-10-CM | POA: Diagnosis not present

## 2023-01-29 DIAGNOSIS — E785 Hyperlipidemia, unspecified: Secondary | ICD-10-CM | POA: Diagnosis not present

## 2023-01-29 DIAGNOSIS — K760 Fatty (change of) liver, not elsewhere classified: Secondary | ICD-10-CM | POA: Diagnosis not present

## 2023-01-29 DIAGNOSIS — I6521 Occlusion and stenosis of right carotid artery: Secondary | ICD-10-CM | POA: Diagnosis not present

## 2023-01-29 DIAGNOSIS — I2510 Atherosclerotic heart disease of native coronary artery without angina pectoris: Secondary | ICD-10-CM | POA: Diagnosis not present

## 2023-01-29 DIAGNOSIS — M1990 Unspecified osteoarthritis, unspecified site: Secondary | ICD-10-CM | POA: Diagnosis not present

## 2023-01-29 DIAGNOSIS — Z96641 Presence of right artificial hip joint: Secondary | ICD-10-CM | POA: Diagnosis not present

## 2023-01-29 DIAGNOSIS — N182 Chronic kidney disease, stage 2 (mild): Secondary | ICD-10-CM | POA: Diagnosis not present

## 2023-01-29 DIAGNOSIS — Z87891 Personal history of nicotine dependence: Secondary | ICD-10-CM | POA: Diagnosis not present

## 2023-01-29 DIAGNOSIS — Z7902 Long term (current) use of antithrombotics/antiplatelets: Secondary | ICD-10-CM | POA: Diagnosis not present

## 2023-01-29 DIAGNOSIS — R7303 Prediabetes: Secondary | ICD-10-CM | POA: Diagnosis not present

## 2023-01-31 DIAGNOSIS — I700 Atherosclerosis of aorta: Secondary | ICD-10-CM | POA: Diagnosis not present

## 2023-01-31 DIAGNOSIS — I471 Supraventricular tachycardia, unspecified: Secondary | ICD-10-CM | POA: Diagnosis not present

## 2023-01-31 DIAGNOSIS — I10 Essential (primary) hypertension: Secondary | ICD-10-CM | POA: Diagnosis not present

## 2023-01-31 DIAGNOSIS — I2510 Atherosclerotic heart disease of native coronary artery without angina pectoris: Secondary | ICD-10-CM | POA: Diagnosis not present

## 2023-01-31 DIAGNOSIS — Z7982 Long term (current) use of aspirin: Secondary | ICD-10-CM | POA: Diagnosis not present

## 2023-01-31 DIAGNOSIS — I351 Nonrheumatic aortic (valve) insufficiency: Secondary | ICD-10-CM | POA: Diagnosis not present

## 2023-01-31 DIAGNOSIS — I708 Atherosclerosis of other arteries: Secondary | ICD-10-CM | POA: Diagnosis not present

## 2023-01-31 DIAGNOSIS — G459 Transient cerebral ischemic attack, unspecified: Secondary | ICD-10-CM | POA: Diagnosis not present

## 2023-01-31 DIAGNOSIS — I340 Nonrheumatic mitral (valve) insufficiency: Secondary | ICD-10-CM | POA: Diagnosis not present

## 2023-01-31 DIAGNOSIS — E785 Hyperlipidemia, unspecified: Secondary | ICD-10-CM | POA: Diagnosis not present

## 2023-01-31 DIAGNOSIS — I6523 Occlusion and stenosis of bilateral carotid arteries: Secondary | ICD-10-CM | POA: Diagnosis not present

## 2023-01-31 DIAGNOSIS — I071 Rheumatic tricuspid insufficiency: Secondary | ICD-10-CM | POA: Diagnosis not present

## 2023-02-04 ENCOUNTER — Inpatient Hospital Stay: Payer: 59 | Admitting: Nurse Practitioner

## 2023-02-05 ENCOUNTER — Telehealth: Payer: Self-pay

## 2023-02-05 DIAGNOSIS — E785 Hyperlipidemia, unspecified: Secondary | ICD-10-CM | POA: Diagnosis not present

## 2023-02-05 DIAGNOSIS — Z7902 Long term (current) use of antithrombotics/antiplatelets: Secondary | ICD-10-CM | POA: Diagnosis not present

## 2023-02-05 DIAGNOSIS — Z96641 Presence of right artificial hip joint: Secondary | ICD-10-CM | POA: Diagnosis not present

## 2023-02-05 DIAGNOSIS — K219 Gastro-esophageal reflux disease without esophagitis: Secondary | ICD-10-CM | POA: Diagnosis not present

## 2023-02-05 DIAGNOSIS — I2510 Atherosclerotic heart disease of native coronary artery without angina pectoris: Secondary | ICD-10-CM | POA: Diagnosis not present

## 2023-02-05 DIAGNOSIS — R7303 Prediabetes: Secondary | ICD-10-CM | POA: Diagnosis not present

## 2023-02-05 DIAGNOSIS — I6521 Occlusion and stenosis of right carotid artery: Secondary | ICD-10-CM | POA: Diagnosis not present

## 2023-02-05 DIAGNOSIS — K760 Fatty (change of) liver, not elsewhere classified: Secondary | ICD-10-CM | POA: Diagnosis not present

## 2023-02-05 DIAGNOSIS — I129 Hypertensive chronic kidney disease with stage 1 through stage 4 chronic kidney disease, or unspecified chronic kidney disease: Secondary | ICD-10-CM | POA: Diagnosis not present

## 2023-02-05 DIAGNOSIS — Z87891 Personal history of nicotine dependence: Secondary | ICD-10-CM | POA: Diagnosis not present

## 2023-02-05 DIAGNOSIS — M1990 Unspecified osteoarthritis, unspecified site: Secondary | ICD-10-CM | POA: Diagnosis not present

## 2023-02-05 DIAGNOSIS — Z8673 Personal history of transient ischemic attack (TIA), and cerebral infarction without residual deficits: Secondary | ICD-10-CM | POA: Diagnosis not present

## 2023-02-05 DIAGNOSIS — N182 Chronic kidney disease, stage 2 (mild): Secondary | ICD-10-CM | POA: Diagnosis not present

## 2023-02-08 ENCOUNTER — Emergency Department
Admission: EM | Admit: 2023-02-08 | Discharge: 2023-02-08 | Disposition: A | Discharge status: Home / Self Care | Payer: 59 | Attending: Emergency Medicine | Admitting: Emergency Medicine

## 2023-02-08 ENCOUNTER — Other Ambulatory Visit: Payer: Self-pay

## 2023-02-08 DIAGNOSIS — R944 Abnormal results of kidney function studies: Secondary | ICD-10-CM | POA: Insufficient documentation

## 2023-02-08 DIAGNOSIS — K0889 Other specified disorders of teeth and supporting structures: Secondary | ICD-10-CM | POA: Diagnosis present

## 2023-02-08 DIAGNOSIS — R7309 Other abnormal glucose: Secondary | ICD-10-CM | POA: Insufficient documentation

## 2023-02-08 DIAGNOSIS — D72829 Elevated white blood cell count, unspecified: Secondary | ICD-10-CM | POA: Insufficient documentation

## 2023-02-08 DIAGNOSIS — R001 Bradycardia, unspecified: Secondary | ICD-10-CM | POA: Diagnosis not present

## 2023-02-08 DIAGNOSIS — K047 Periapical abscess without sinus: Secondary | ICD-10-CM | POA: Diagnosis not present

## 2023-02-08 LAB — CBC
HCT: 44.1 % (ref 36.0–46.0)
Hemoglobin: 14.8 g/dL (ref 12.0–15.0)
MCH: 28.7 pg (ref 26.0–34.0)
MCHC: 33.6 g/dL (ref 30.0–36.0)
MCV: 85.5 fL (ref 80.0–100.0)
Platelets: 302 10*3/uL (ref 150–400)
RBC: 5.16 MIL/uL — ABNORMAL HIGH (ref 3.87–5.11)
RDW: 14.7 % (ref 11.5–15.5)
WBC: 10.9 10*3/uL — ABNORMAL HIGH (ref 4.0–10.5)
nRBC: 0 % (ref 0.0–0.2)

## 2023-02-08 LAB — BASIC METABOLIC PANEL
Anion gap: 10 (ref 5–15)
BUN: 14 mg/dL (ref 8–23)
CO2: 26 mmol/L (ref 22–32)
Calcium: 9.6 mg/dL (ref 8.9–10.3)
Chloride: 100 mmol/L (ref 98–111)
Creatinine, Ser: 1.26 mg/dL — ABNORMAL HIGH (ref 0.44–1.00)
GFR, Estimated: 43 mL/min — ABNORMAL LOW (ref >60)
Glucose, Bld: 130 mg/dL — ABNORMAL HIGH (ref 70–99)
Potassium: 3.8 mmol/L (ref 3.5–5.1)
Sodium: 136 mmol/L (ref 135–145)

## 2023-02-08 LAB — HEPATIC FUNCTION PANEL
ALT: 16 U/L (ref 0–44)
AST: 22 U/L (ref 15–41)
Albumin: 4.2 g/dL (ref 3.5–5.0)
Alkaline Phosphatase: 98 U/L (ref 38–126)
Bilirubin, Direct: <0.1 mg/dL (ref 0.0–0.2)
Total Bilirubin: 0.8 mg/dL (ref 0.3–1.2)
Total Protein: 7.9 g/dL (ref 6.5–8.1)

## 2023-02-08 LAB — LIPASE, BLOOD: Lipase: 23 U/L (ref 11–51)

## 2023-02-08 LAB — TROPONIN I (HIGH SENSITIVITY): Troponin I (High Sensitivity): 7 ng/L (ref <18)

## 2023-02-08 LAB — CBG MONITORING, ED: Glucose-Capillary: 106 mg/dL — ABNORMAL HIGH (ref 70–99)

## 2023-02-08 MED ORDER — METRONIDAZOLE 500 MG PO TABS: 500.0000 mg | ORAL_TABLET | Freq: Three times a day (TID) | ORAL | 0 refills | Status: AC

## 2023-02-08 MED ORDER — METRONIDAZOLE 500 MG PO TABS
500.0000 mg | ORAL_TABLET | Freq: Once | ORAL | Status: AC
  Administered 2023-02-08: 500 mg via ORAL
  Filled 2023-02-08: qty 1

## 2023-02-08 MED ORDER — FAMOTIDINE 20 MG PO TABS
20.0000 mg | ORAL_TABLET | Freq: Once | ORAL | Status: AC
  Administered 2023-02-08: 20 mg via ORAL
  Filled 2023-02-08: qty 1

## 2023-02-08 MED ORDER — CEFUROXIME AXETIL 500 MG PO TABS: 500.0000 mg | ORAL_TABLET | Freq: Two times a day (BID) | ORAL | 0 refills | Status: AC

## 2023-02-08 NOTE — ED Notes (Signed)
This Rn called lab to check for the results once again regarding results. Spoke with chemist Arbutus Ped again who let me know that the results should be visible shortly

## 2023-02-08 NOTE — ED Triage Notes (Signed)
Pt comes with c/o left sided tooth ache. Pt states she thinks it might be infected.   HR checked and 46. Pt states no cp or sob. Pt states she just wore heart monitor for week bc of her HR.

## 2023-02-08 NOTE — ED Notes (Signed)
Patient ambulatory to bathroom.

## 2023-02-08 NOTE — Discharge Instructions (Signed)
Please call your dentist and set a follow-up appointment this upcoming week to deal with the dental infections and teeth removal.  Please return to emergency department for any worsening symptoms.

## 2023-02-08 NOTE — ED Notes (Signed)
Called lab to discuss the delay in results of pt's troponin and hepatic panel. This rn transferred to chemist. Chemist informed this RN, there was no error, and that the results should post in the next 10 min. MD Anner Crete notified via secure chat.

## 2023-02-08 NOTE — ED Provider Notes (Signed)
[facility] Provider Note    Event Date/Time   First MD Initiated Contact with Patient 02/08/23 1046     (approximate)   History   Dental Pain and Bradycardia   HPI Amanda Duke is a 82 y.o. female presenting today for dental pain.  Patient states over the past couple of days she has noticed pain on the upper left side of her mouth and is worried for dental infection.  She has a history of poor dentition and was supposed to see a dentist to have the teeth removed but has not gotten seen them yet.  She denies fever, chills.  She has had some intermittent nausea but otherwise denies chest pain, shortness of breath, abdominal pain, diarrhea, constipation.  Separately, patient noted her heart rate was lower today which is why she came to the emergency department over concerns of possible sepsis.  She has been followed with a Holter monitor outpatient for diagnosis of A-fib and is currently on medication for it.  Per chart review: Patient had recent admission for TIA with no return of symptoms.     Physical Exam   Triage Vital Signs: ED Triage Vitals  Encounter Vitals Group     BP 02/08/23 1034 139/75     Systolic BP Percentile --      Diastolic BP Percentile --      Pulse Rate 02/08/23 1034 (!) 46     Resp 02/08/23 1034 18     Temp 02/08/23 1034 98 F (36.7 C)     Temp src --      SpO2 02/08/23 1034 98 %     Weight --      Height --      Head Circumference --      Peak Flow --      Pain Score 02/08/23 1036 6     Pain Loc --      Pain Education --      Exclude from Growth Chart --     Most recent vital signs: Vitals:   02/08/23 1034  BP: 139/75  Pulse: (!) 46  Resp: 18  Temp: 98 F (36.7 C)  SpO2: 98%   Physical Exam: I have reviewed the vital signs and nursing notes. General: Awake, alert, no acute distress.  Nontoxic appearing. Head:  Atraumatic, normocephalic.   ENT:  EOM intact, PERRL. Oral mucosa is pink and moist with no  lesions.  Poor dentition noted to maxillary teeth with only several teeth remaining.  Tenderness palpation over left maxillary canine without signs of abscess.  No overt unilateral facial swelling. Neck: Neck is supple with full range of motion, No meningeal signs. Cardiovascular:  RRR, No murmurs. Peripheral pulses palpable and equal bilaterally. Respiratory:  Symmetrical chest wall expansion.  No rhonchi, rales, or wheezes.  Good air movement throughout.  No use of accessory muscles.   Musculoskeletal:  No cyanosis or edema. Moving extremities with full ROM Abdomen:  Soft, nontender, nondistended. Neuro:  GCS 15, moving all four extremities, interacting appropriately. Speech clear. Psych:  Calm, appropriate.   Skin:  Warm, dry, no rash.    ED Results / Procedures / Treatments   Labs (all labs ordered are listed, but only abnormal results are displayed) Labs Reviewed  BASIC METABOLIC PANEL - Abnormal; Notable for the following components:      Result Value   Glucose, Bld 130 (*)    Creatinine, Ser 1.26 (*)    GFR, Estimated 43 (*)  All other components within normal limits  CBC - Abnormal; Notable for the following components:   WBC 10.9 (*)    RBC 5.16 (*)    All other components within normal limits  CBG MONITORING, ED - Abnormal; Notable for the following components:   Glucose-Capillary 106 (*)    All other components within normal limits  HEPATIC FUNCTION PANEL  LIPASE, BLOOD  TROPONIN I (HIGH SENSITIVITY)     EKG My EKG interpretation: Rate of 99, appears to be sinus rhythm with occasional PACs in a pattern of bigeminy.   RADIOLOGY    PROCEDURES:  Critical Care performed: No  Procedures   MEDICATIONS ORDERED IN ED: Medications  famotidine (PEPCID) tablet 20 mg (20 mg Oral Given 02/08/23 1118)  metroNIDAZOLE (FLAGYL) tablet 500 mg (500 mg Oral Given 02/08/23 1308)     IMPRESSION / MDM / ASSESSMENT AND PLAN / ED COURSE  I reviewed the triage vital signs  and the nursing notes.                              Differential diagnosis includes, but is not limited to, dental infection, dental abscess, indigestion, GERD  Patient's presentation is most consistent with acute complicated illness / injury requiring diagnostic workup.  Patient is an 82 year old female presenting today for dental pain and feeling unwell.  Vital signs on arrival show initial bradycardia.  Once patient was placed on continuous monitor, heart rate is stable in the 80s to 90s.  EKG with some concern for possible A-fib versus frequent PACs.  No overt tachycardia.  Vital signs otherwise stable and patient not meeting sepsis criteria.  Exam does show evidence of poor dentition in the maxillary teeth with only a couple teeth remaining but no obvious evidence of abscess.  Concern for dental infection.  Further laboratory workup was performed and overall reassuring.  Plan to discharge patient at this time with outpatient follow-up with dentistry.  Given prior allergic reactions to amoxicillin and clindamycin which patient does not wish to take at this time, discussed case with pharmacy who recommended course of Ceftin and Flagyl.  Patient given strict return precautions.  The patient is on the cardiac monitor to evaluate for evidence of arrhythmia and/or significant heart rate changes. Clinical Course as of 02/08/23 1309  Sat Feb 08, 2023  1108 Basic metabolic panel(!) Slight elevation in creatinine but does have history of CKD.  No profound AKI [DW]  1114 WBC(!): 10.9 Mild leukocytosis was present possible in the setting of her dental infection.  No tachycardia, hypotension, or tachypnea.  Patient not meeting sepsis criteria at this time. [DW]    Clinical Course User Index [DW] Janith Lima, MD     FINAL CLINICAL IMPRESSION(S) / ED DIAGNOSES   Final diagnoses:  Dental infection     Rx / DC Orders   ED Discharge Orders          Ordered    metroNIDAZOLE (FLAGYL) 500 MG  tablet  3 times daily        02/08/23 1305    cefUROXime (CEFTIN) 500 MG tablet  2 times daily with meals        02/08/23 1305             Note:  This document was prepared using Dragon voice recognition software and may include unintentional dictation errors.   Janith Lima, MD 02/08/23 1310

## 2023-02-10 DIAGNOSIS — R531 Weakness: Secondary | ICD-10-CM | POA: Diagnosis not present

## 2023-02-10 DIAGNOSIS — Z7902 Long term (current) use of antithrombotics/antiplatelets: Secondary | ICD-10-CM | POA: Diagnosis not present

## 2023-02-10 DIAGNOSIS — I129 Hypertensive chronic kidney disease with stage 1 through stage 4 chronic kidney disease, or unspecified chronic kidney disease: Secondary | ICD-10-CM | POA: Diagnosis not present

## 2023-02-10 DIAGNOSIS — Z8673 Personal history of transient ischemic attack (TIA), and cerebral infarction without residual deficits: Secondary | ICD-10-CM | POA: Diagnosis not present

## 2023-02-10 DIAGNOSIS — K760 Fatty (change of) liver, not elsewhere classified: Secondary | ICD-10-CM | POA: Diagnosis not present

## 2023-02-10 DIAGNOSIS — Z87891 Personal history of nicotine dependence: Secondary | ICD-10-CM | POA: Diagnosis not present

## 2023-02-10 DIAGNOSIS — G459 Transient cerebral ischemic attack, unspecified: Secondary | ICD-10-CM | POA: Diagnosis not present

## 2023-02-10 DIAGNOSIS — Z96641 Presence of right artificial hip joint: Secondary | ICD-10-CM | POA: Diagnosis not present

## 2023-02-10 DIAGNOSIS — R7303 Prediabetes: Secondary | ICD-10-CM | POA: Diagnosis not present

## 2023-02-10 DIAGNOSIS — N182 Chronic kidney disease, stage 2 (mild): Secondary | ICD-10-CM | POA: Diagnosis not present

## 2023-02-10 DIAGNOSIS — I6521 Occlusion and stenosis of right carotid artery: Secondary | ICD-10-CM | POA: Diagnosis not present

## 2023-02-10 DIAGNOSIS — K219 Gastro-esophageal reflux disease without esophagitis: Secondary | ICD-10-CM | POA: Diagnosis not present

## 2023-02-10 DIAGNOSIS — I2510 Atherosclerotic heart disease of native coronary artery without angina pectoris: Secondary | ICD-10-CM | POA: Diagnosis not present

## 2023-02-10 DIAGNOSIS — E785 Hyperlipidemia, unspecified: Secondary | ICD-10-CM | POA: Diagnosis not present

## 2023-02-10 DIAGNOSIS — I771 Stricture of artery: Secondary | ICD-10-CM | POA: Diagnosis not present

## 2023-02-10 DIAGNOSIS — M1990 Unspecified osteoarthritis, unspecified site: Secondary | ICD-10-CM | POA: Diagnosis not present

## 2023-02-11 ENCOUNTER — Encounter: Payer: Self-pay | Admitting: Nurse Practitioner

## 2023-02-11 ENCOUNTER — Ambulatory Visit (INDEPENDENT_AMBULATORY_CARE_PROVIDER_SITE_OTHER): Payer: 59 | Admitting: Nurse Practitioner

## 2023-02-11 VITALS — BP 134/68 | HR 91 | Temp 98.2°F | Resp 16 | Ht 64.0 in | Wt 151.6 lb

## 2023-02-11 DIAGNOSIS — Z09 Encounter for follow-up examination after completed treatment for conditions other than malignant neoplasm: Secondary | ICD-10-CM

## 2023-02-11 DIAGNOSIS — I700 Atherosclerosis of aorta: Secondary | ICD-10-CM | POA: Diagnosis not present

## 2023-02-11 DIAGNOSIS — I499 Cardiac arrhythmia, unspecified: Secondary | ICD-10-CM

## 2023-02-11 DIAGNOSIS — Z8673 Personal history of transient ischemic attack (TIA), and cerebral infarction without residual deficits: Secondary | ICD-10-CM

## 2023-02-11 DIAGNOSIS — K047 Periapical abscess without sinus: Secondary | ICD-10-CM | POA: Diagnosis not present

## 2023-02-11 NOTE — Progress Notes (Unsigned)
[facility] [place], [place] [address] [place] [address] [phone]                                   Transitional Care Clinic   [facility] Discharge Acute Issues Care Follow Up                                                                        Patient Demographics  Amanda Duke, is a 82 y.o. female  DOB 10/30/1940  MRN [id number].  Primary MD  Carlean Jews, PA-C  Admit date:     01/25/2023  Discharge date: 01/26/23    Reason for TCC follow Up - TIA and then ED visit for a dental infection    Past Medical History:  Diagnosis Date   Anxiety    Aortic atherosclerosis (HCC)    Arthritis    Bilateral renal cysts    Coronary artery disease 09/25/2001   a.) LHC 09/25/2001: EF 78%. 20% pLAD - med mgmt; b.) LHC 06/09/2003: EF 63%. 20% pLCx, 20% pLAD, 50% mLAD, 50% dLAD - med mgmt.   Diastolic dysfunction 12/30/2017   a.) Stress echo 12/30/2017: ED >55%, triv PR, mild AR.MR, mod TR, G1DD; b.) TTE 03/30/2020: EF >55%, mild LVH, mild panvalvular regurgitation.   Gastritis    GERD (gastroesophageal reflux disease)    Hepatic steatosis    History of 2019 novel coronavirus disease (COVID-19) 01/11/2022   History of hiatal hernia    a.) s/p repair 01/2022   Hyperlipemia    Hyperplastic colon polyp    Hypertension    Hypoglycemia 05/2022   this is something new-seeing pcp for this currently   Pneumonia    PONV (postoperative nausea and vomiting)    a.) PONV following orthopedic surgery (hip) in 2022   Prediabetes    PSVT (paroxysmal supraventricular tachycardia) (HCC)    Redundant colon    Tubular adenoma of colon    Umbilical hernia     Past Surgical History:  Procedure Laterality Date   APPENDECTOMY  1971   ARTHRODESIS  1980   BACK SURGERY  1980   COLONOSCOPY WITH PROPOFOL N/A 12/12/2016   Procedure: COLONOSCOPY WITH PROPOFOL;  Surgeon: Christena Deem, MD;  Location: [facility] ENDOSCOPY;  Service: Endoscopy;  Laterality:  N/A;   COLONOSCOPY WITH PROPOFOL N/A 07/14/2019   Procedure: COLONOSCOPY WITH PROPOFOL;  Surgeon: Toledo, Boykin Nearing, MD;  Location: [facility] ENDOSCOPY;  Service: Gastroenterology;  Laterality: N/A;   ESOPHAGOGASTRODUODENOSCOPY N/A 12/27/2021   Procedure: ESOPHAGOGASTRODUODENOSCOPY (EGD);  Surgeon: Sung Amabile, DO;  Location: [facility] ENDOSCOPY;  Service: General;  Laterality: N/A;   ESOPHAGOGASTRODUODENOSCOPY (EGD) WITH PROPOFOL N/A 12/12/2016   Procedure: ESOPHAGOGASTRODUODENOSCOPY (EGD) WITH PROPOFOL;  Surgeon: Christena Deem, MD;  Location: [facility] ENDOSCOPY;  Service: Endoscopy;  Laterality: N/A;   ESOPHAGOGASTRODUODENOSCOPY (EGD) WITH PROPOFOL N/A 07/14/2019   Procedure: ESOPHAGOGASTRODUODENOSCOPY (EGD) WITH PROPOFOL;  Surgeon: Toledo, Boykin Nearing, MD;  Location: [facility] ENDOSCOPY;  Service: Gastroenterology;  Laterality: N/A;   INSERTION OF MESH  02/05/2022   Procedure: INSERTION OF MESH;  Surgeon: Leafy Ro, MD;  Location: [facility] ORS;  Service: General;;   LAPAROSCOPIC UNILATERAL SALPINGO  OOPHERECTOMY  1985   right   LEFT HEART CATH AND CORONARY ANGIOGRAPHY Left 09/25/2001   Procedure: LEFT HEART CATH AND CORONARY ANGIOGRAPHY; Location: [facility]; Surgeon: Rudean Hitt, MD   LEFT HEART CATH AND CORONARY ANGIOGRAPHY Left 06/09/2003   Procedure: LEFT HEART CATH AND CORONARY ANGIOGRAPHY; Location: [facility]; Surgeon: Rudean Hitt, MD   ORIF ANKLE FRACTURE Right    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    TONSILLECTOMY  1948   TUBAL LIGATION  1971   UMBILICAL HERNIA REPAIR N/A 02/05/2022   Procedure: HERNIA REPAIR UMBILICAL ADULT;  Surgeon: Leafy Ro, MD;  Location: [facility] ORS;  Service: General;  Laterality: N/A;   VAGINAL HYSTERECTOMY  1985   d/t heavy bleeding   XI ROBOTIC ASSISTED PARAESOPHAGEAL HERNIA REPAIR N/A 02/05/2022   Procedure: XI ROBOTIC ASSISTED PARAESOPHAGEAL HERNIA REPAIR, RNFA to assist;  Surgeon: Leafy Ro, MD;  Location: [facility] ORS;  Service: General;   Laterality: N/A;       Recent HPI and Hospital Course  Hospital Course: Amanda Duke is a 82 year old female with history of hypertension, anxiety, hyperlipidemia, GERD, who presents emergency department for chief concerns of right-sided weakness and numbness.  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  This was discussed with neurologist and since patient is asymptomatic no additional recommendation needed.  Patient also underwent echocardiogram.  PT OT saw patient and have recommended discharge home with home health.  Patient will follow-up with primary care physician.  Post Hospital Acute Care Issue to be followed in the Clinic   TIA, acute CVA ruled out Hypertensive disorder Dyslipidemia Hiatal hernia with GERD Dysuria, urinalysis within normal limit Impaired fasting glucose Chronic kidney disease, stage 2 (mild) Acid reflux   Subjective:   Amanda Duke today has, No headache, No chest pain, No abdominal pain - No Nausea, No new weakness tingling or numbness, No Cough - SOB.   Assessment & Plan   1. Hospital discharge follow-up Treated for TIA in hospital then had ED visit for dental abscess  2. Dental abscess Currently taking her antibiotics and then will be seeing the dentist soon   3. History of TIA (transient ischemic attack) Symptoms resolved   4. Irregular heart rhythm Is following up with cardiology in a few weeks.   5. Atherosclerosis of aorta (HCC) On max dose rosuvastatin    Reason for frequent admissions/ER visits    possible Afib TIA Hypertension Hx of SVT osteoporosis   Objective:   Vitals:   02/11/23 1143  BP: 134/68  Pulse: 91  Resp: 16  Temp: 98.2 F (36.8 C)  SpO2: 97%  Weight: 151 lb 9.6 oz (68.8 kg)  Height: 5\' 4"  (1.626 m)    Wt Readings from Last 3 Encounters:  02/11/23 151 lb 9.6 oz (68.8 kg)   01/25/23 163 lb 6.4 oz (74.1 kg)  11/14/22 157 lb 9.6 oz (71.5 kg)    Allergies as of 02/11/2023       Reactions   Amoxicillin Hives   Patient tolerated Ceftriaxone in ED on 01/10/19.    Clindamycin Diarrhea   Gemfibrozil Other (See Comments)   muscle ache   Levofloxacin Other (See Comments)   Muscle ache for several months   Metoprolol Other (See Comments)   "Lowers blood pressure extremely low"   Simvastatin Other (See Comments)   muscle ache   Macrobid [nitrofurantoin Macrocrystal]  Diarrhea   Niacin Rash   Nystatin Rash   Nystatin-triamcinolone Rash   Sulfa Antibiotics Rash   Family history of medication allergy        Medication List        Accurate as of February 11, 2023 11:59 PM. If you have any questions, ask your nurse or doctor.          acetaminophen 500 MG tablet Commonly known as: TYLENOL Take 1,000 mg by mouth every 6 (six) hours as needed for moderate pain.   aspirin EC 81 MG tablet Take 81 mg by mouth daily. Swallow whole.   cefUROXime 500 MG tablet Commonly known as: CEFTIN Take 1 tablet (500 mg total) by mouth 2 (two) times daily with a meal for 7 days.   cholecalciferol 1000 units tablet Commonly known as: VITAMIN D Take 1,000 Units by mouth daily.   clonazePAM 0.5 MG tablet Commonly known as: KLONOPIN TAKE 1 TABLET (0.5 MG TOTAL) BY MOUTH AS NEEDED FOR ANXIETY. TAKE 1 TABLET BY MOUTH AS NEEDED   clopidogrel 75 MG tablet Commonly known as: Plavix Take 1 tablet (75 mg total) by mouth daily.   diltiazem 180 MG 24 hr capsule Commonly known as: CARDIZEM CD TAKE 1 CAPSULE BY MOUTH EVERY DAY   fluticasone 50 MCG/ACT nasal spray Commonly known as: FLONASE SPRAY 1 SPRAY INTO BOTH NOSTRILS DAILY.   icosapent Ethyl 1 g capsule Commonly known as: VASCEPA Take 2 g by mouth 2 (two) times daily.   linaclotide 290 MCG Caps capsule Commonly known as: Linzess Take 1 capsule (290 mcg total) by mouth daily. What changed: when to take this    losartan 100 MG tablet Commonly known as: COZAAR Take 100 mg by mouth every morning.   metroNIDAZOLE 500 MG tablet Commonly known as: Flagyl Take 1 tablet (500 mg total) by mouth 3 (three) times daily for 7 days.   ondansetron 4 MG disintegrating tablet Commonly known as: ZOFRAN-ODT Take 1 tablet (4 mg total) by mouth every 8 (eight) hours as needed. What changed: reasons to take this   pantoprazole 40 MG tablet Commonly known as: PROTONIX Take 40 mg by mouth as needed.   rosuvastatin 40 MG tablet Commonly known as: CRESTOR Take 40 mg by mouth at bedtime.   senna-docusate 8.6-50 MG tablet Commonly known as: Senokot-S Take 1 tablet by mouth at bedtime as needed for mild constipation.   solifenacin 5 MG tablet Commonly known as: VESICARE TAKE 1 TABLET (5 MG TOTAL) BY MOUTH DAILY. What changed:  when to take this reasons to take this         Physical Exam: Constitutional: Patient appears well-developed and well-nourished. Not in obvious distress. HENT: Normocephalic, atraumatic, External right and left ear normal. Oropharynx is clear and moist.  Eyes: Conjunctivae and EOM are normal. PERRLA, no scleral icterus. Neck: Normal ROM. Neck supple. No JVD. No tracheal deviation. No thyromegaly. CVS: RRR, S1/S2 +, no murmurs, no gallops, no carotid bruit.  Pulmonary: Effort and breath sounds normal, no stridor, rhonchi, wheezes, rales.  Abdominal: Soft. BS +, no distension, tenderness, rebound or guarding.  Musculoskeletal: Normal range of motion. No edema and no tenderness.  Lymphadenopathy: No lymphadenopathy noted, cervical, inguinal or axillary Neuro: Alert. Normal reflexes, muscle tone coordination. No cranial nerve deficit. Skin: Skin is warm and dry. No rash noted. Not diaphoretic. No erythema. No pallor. Psychiatric: Normal mood and affect. Behavior, judgment, thought content normal.   Data Review   Micro Results No results found  for this or any previous visit  (from the past 240 hour(s)).   CBC Recent Labs  Lab 02/08/23 1045  WBC 10.9*  HGB 14.8  HCT 44.1  PLT 302  MCV 85.5  MCH 28.7  MCHC 33.6  RDW 14.7    Chemistries  Recent Labs  Lab 02/08/23 1045  NA 136  K 3.8  CL 100  CO2 26  GLUCOSE 130*  BUN 14  CREATININE 1.26*  CALCIUM 9.6  AST 22  ALT 16  ALKPHOS 98  BILITOT 0.8   ------------------------------------------------------------------------------------------------------------------ estimated creatinine clearance is 32.8 mL/min (A) (by C-G formula based on SCr of 1.26 mg/dL (H)). ------------------------------------------------------------------------------------------------------------------ No results for input(s): "HGBA1C" in the last 72 hours. ------------------------------------------------------------------------------------------------------------------ No results for input(s): "CHOL", "HDL", "LDLCALC", "TRIG", "CHOLHDL", "LDLDIRECT" in the last 72 hours. ------------------------------------------------------------------------------------------------------------------ No results for input(s): "TSH", "T4TOTAL", "T3FREE", "THYROIDAB" in the last 72 hours.  Invalid input(s): "FREET3" ------------------------------------------------------------------------------------------------------------------ No results for input(s): "VITAMINB12", "FOLATE", "FERRITIN", "TIBC", "IRON", "RETICCTPCT" in the last 72 hours.  Coagulation profile No results for input(s): "INR", "PROTIME" in the last 168 hours.  No results for input(s): "DDIMER" in the last 72 hours.  Cardiac Enzymes No results for input(s): "CKMB", "TROPONINI", "MYOGLOBIN" in the last 168 hours.  Invalid input(s): "CK" ------------------------------------------------------------------------------------------------------------------ Invalid input(s): "POCBNP"  Return for previously scheduled appt with PCP in december.   Time Spent in minutes  45 Time spent  with patient included reviewing progress notes, labs, imaging studies, and discussing plan for follow up.   This patient was seen by Sallyanne Kuster, FNP-C in collaboration with Dr. Beverely Risen as a part of collaborative care agreement.    Sallyanne Kuster MSN, FNP-C on 02/11/2023 at 12:04 PM   **Disclaimer: This note may have been dictated with voice recognition software. Similar sounding words can inadvertently be transcribed and this note may contain transcription errors which may not have been corrected upon publication of note.**

## 2023-02-12 ENCOUNTER — Encounter: Payer: Self-pay | Admitting: Nurse Practitioner

## 2023-02-12 DIAGNOSIS — H2513 Age-related nuclear cataract, bilateral: Secondary | ICD-10-CM | POA: Diagnosis not present

## 2023-02-12 DIAGNOSIS — H43813 Vitreous degeneration, bilateral: Secondary | ICD-10-CM | POA: Diagnosis not present

## 2023-02-13 DIAGNOSIS — I471 Supraventricular tachycardia, unspecified: Secondary | ICD-10-CM | POA: Diagnosis not present

## 2023-02-13 DIAGNOSIS — I071 Rheumatic tricuspid insufficiency: Secondary | ICD-10-CM | POA: Diagnosis not present

## 2023-02-13 DIAGNOSIS — I6521 Occlusion and stenosis of right carotid artery: Secondary | ICD-10-CM | POA: Diagnosis not present

## 2023-02-13 DIAGNOSIS — Z96641 Presence of right artificial hip joint: Secondary | ICD-10-CM | POA: Diagnosis not present

## 2023-02-13 DIAGNOSIS — I2510 Atherosclerotic heart disease of native coronary artery without angina pectoris: Secondary | ICD-10-CM | POA: Diagnosis not present

## 2023-02-13 DIAGNOSIS — K219 Gastro-esophageal reflux disease without esophagitis: Secondary | ICD-10-CM | POA: Diagnosis not present

## 2023-02-13 DIAGNOSIS — I10 Essential (primary) hypertension: Secondary | ICD-10-CM | POA: Diagnosis not present

## 2023-02-13 DIAGNOSIS — I708 Atherosclerosis of other arteries: Secondary | ICD-10-CM | POA: Diagnosis not present

## 2023-02-13 DIAGNOSIS — G459 Transient cerebral ischemic attack, unspecified: Secondary | ICD-10-CM | POA: Diagnosis not present

## 2023-02-13 DIAGNOSIS — I340 Nonrheumatic mitral (valve) insufficiency: Secondary | ICD-10-CM | POA: Diagnosis not present

## 2023-02-13 DIAGNOSIS — R7303 Prediabetes: Secondary | ICD-10-CM | POA: Diagnosis not present

## 2023-02-13 DIAGNOSIS — I129 Hypertensive chronic kidney disease with stage 1 through stage 4 chronic kidney disease, or unspecified chronic kidney disease: Secondary | ICD-10-CM | POA: Diagnosis not present

## 2023-02-13 DIAGNOSIS — I700 Atherosclerosis of aorta: Secondary | ICD-10-CM | POA: Diagnosis not present

## 2023-02-13 DIAGNOSIS — I351 Nonrheumatic aortic (valve) insufficiency: Secondary | ICD-10-CM | POA: Diagnosis not present

## 2023-02-13 DIAGNOSIS — I6523 Occlusion and stenosis of bilateral carotid arteries: Secondary | ICD-10-CM | POA: Diagnosis not present

## 2023-02-13 DIAGNOSIS — Z8673 Personal history of transient ischemic attack (TIA), and cerebral infarction without residual deficits: Secondary | ICD-10-CM | POA: Diagnosis not present

## 2023-02-13 DIAGNOSIS — Z87891 Personal history of nicotine dependence: Secondary | ICD-10-CM | POA: Diagnosis not present

## 2023-02-13 DIAGNOSIS — E785 Hyperlipidemia, unspecified: Secondary | ICD-10-CM | POA: Diagnosis not present

## 2023-02-13 DIAGNOSIS — Z7982 Long term (current) use of aspirin: Secondary | ICD-10-CM | POA: Diagnosis not present

## 2023-02-13 DIAGNOSIS — N182 Chronic kidney disease, stage 2 (mild): Secondary | ICD-10-CM | POA: Diagnosis not present

## 2023-02-13 DIAGNOSIS — K760 Fatty (change of) liver, not elsewhere classified: Secondary | ICD-10-CM | POA: Diagnosis not present

## 2023-02-13 DIAGNOSIS — M1990 Unspecified osteoarthritis, unspecified site: Secondary | ICD-10-CM | POA: Diagnosis not present

## 2023-02-13 DIAGNOSIS — Z7902 Long term (current) use of antithrombotics/antiplatelets: Secondary | ICD-10-CM | POA: Diagnosis not present

## 2023-02-20 DIAGNOSIS — N182 Chronic kidney disease, stage 2 (mild): Secondary | ICD-10-CM | POA: Diagnosis not present

## 2023-02-20 DIAGNOSIS — I6521 Occlusion and stenosis of right carotid artery: Secondary | ICD-10-CM | POA: Diagnosis not present

## 2023-02-20 DIAGNOSIS — Z96641 Presence of right artificial hip joint: Secondary | ICD-10-CM | POA: Diagnosis not present

## 2023-02-20 DIAGNOSIS — Z7902 Long term (current) use of antithrombotics/antiplatelets: Secondary | ICD-10-CM | POA: Diagnosis not present

## 2023-02-20 DIAGNOSIS — K219 Gastro-esophageal reflux disease without esophagitis: Secondary | ICD-10-CM | POA: Diagnosis not present

## 2023-02-20 DIAGNOSIS — E785 Hyperlipidemia, unspecified: Secondary | ICD-10-CM | POA: Diagnosis not present

## 2023-02-20 DIAGNOSIS — R7303 Prediabetes: Secondary | ICD-10-CM | POA: Diagnosis not present

## 2023-02-20 DIAGNOSIS — Z87891 Personal history of nicotine dependence: Secondary | ICD-10-CM | POA: Diagnosis not present

## 2023-02-20 DIAGNOSIS — I2510 Atherosclerotic heart disease of native coronary artery without angina pectoris: Secondary | ICD-10-CM | POA: Diagnosis not present

## 2023-02-20 DIAGNOSIS — I129 Hypertensive chronic kidney disease with stage 1 through stage 4 chronic kidney disease, or unspecified chronic kidney disease: Secondary | ICD-10-CM | POA: Diagnosis not present

## 2023-02-20 DIAGNOSIS — Z8673 Personal history of transient ischemic attack (TIA), and cerebral infarction without residual deficits: Secondary | ICD-10-CM | POA: Diagnosis not present

## 2023-02-20 DIAGNOSIS — K760 Fatty (change of) liver, not elsewhere classified: Secondary | ICD-10-CM | POA: Diagnosis not present

## 2023-02-20 DIAGNOSIS — M1990 Unspecified osteoarthritis, unspecified site: Secondary | ICD-10-CM | POA: Diagnosis not present

## 2023-02-25 ENCOUNTER — Telehealth: Payer: Self-pay

## 2023-02-26 ENCOUNTER — Telehealth: Payer: Self-pay

## 2023-02-26 DIAGNOSIS — N182 Chronic kidney disease, stage 2 (mild): Secondary | ICD-10-CM | POA: Diagnosis not present

## 2023-02-26 DIAGNOSIS — Z96641 Presence of right artificial hip joint: Secondary | ICD-10-CM | POA: Diagnosis not present

## 2023-02-26 DIAGNOSIS — I129 Hypertensive chronic kidney disease with stage 1 through stage 4 chronic kidney disease, or unspecified chronic kidney disease: Secondary | ICD-10-CM | POA: Diagnosis not present

## 2023-02-26 DIAGNOSIS — Z8673 Personal history of transient ischemic attack (TIA), and cerebral infarction without residual deficits: Secondary | ICD-10-CM | POA: Diagnosis not present

## 2023-02-26 DIAGNOSIS — I2510 Atherosclerotic heart disease of native coronary artery without angina pectoris: Secondary | ICD-10-CM | POA: Diagnosis not present

## 2023-02-26 DIAGNOSIS — K219 Gastro-esophageal reflux disease without esophagitis: Secondary | ICD-10-CM | POA: Diagnosis not present

## 2023-02-26 DIAGNOSIS — I6521 Occlusion and stenosis of right carotid artery: Secondary | ICD-10-CM | POA: Diagnosis not present

## 2023-02-26 DIAGNOSIS — E785 Hyperlipidemia, unspecified: Secondary | ICD-10-CM | POA: Diagnosis not present

## 2023-02-26 DIAGNOSIS — M1990 Unspecified osteoarthritis, unspecified site: Secondary | ICD-10-CM | POA: Diagnosis not present

## 2023-02-26 DIAGNOSIS — Z87891 Personal history of nicotine dependence: Secondary | ICD-10-CM | POA: Diagnosis not present

## 2023-02-26 DIAGNOSIS — K760 Fatty (change of) liver, not elsewhere classified: Secondary | ICD-10-CM | POA: Diagnosis not present

## 2023-02-26 DIAGNOSIS — Z7902 Long term (current) use of antithrombotics/antiplatelets: Secondary | ICD-10-CM | POA: Diagnosis not present

## 2023-02-26 DIAGNOSIS — R7303 Prediabetes: Secondary | ICD-10-CM | POA: Diagnosis not present

## 2023-02-26 NOTE — Telephone Encounter (Addendum)
[facility] called Marisue Ivan [phone] that they received a home health referral from hospital for OT and PT and they like to they can used hypertension for focus of care as per alyssa gave ok to used hypetenion

## 2023-03-05 ENCOUNTER — Telehealth: Payer: Self-pay | Admitting: Physician Assistant

## 2023-03-05 NOTE — Telephone Encounter (Signed)
Received home plan of care from [facility]. Gave to [employer]

## 2023-03-07 ENCOUNTER — Telehealth: Payer: Self-pay | Admitting: Physician Assistant

## 2023-03-07 NOTE — Telephone Encounter (Signed)
Home Health Plan of Care faxed back to [facility]; [phone]. Scanned-Toni

## 2023-03-08 ENCOUNTER — Other Ambulatory Visit: Payer: Self-pay | Admitting: Physician Assistant

## 2023-03-08 DIAGNOSIS — I10 Essential (primary) hypertension: Secondary | ICD-10-CM

## 2023-03-11 DIAGNOSIS — K219 Gastro-esophageal reflux disease without esophagitis: Secondary | ICD-10-CM | POA: Diagnosis not present

## 2023-03-11 DIAGNOSIS — K5909 Other constipation: Secondary | ICD-10-CM | POA: Diagnosis not present

## 2023-03-12 ENCOUNTER — Telehealth: Payer: Self-pay

## 2023-03-14 ENCOUNTER — Encounter (INDEPENDENT_AMBULATORY_CARE_PROVIDER_SITE_OTHER): Payer: 59 | Admitting: Vascular Surgery

## 2023-03-31 ENCOUNTER — Other Ambulatory Visit: Payer: Self-pay | Admitting: Physician Assistant

## 2023-04-03 ENCOUNTER — Ambulatory Visit (INDEPENDENT_AMBULATORY_CARE_PROVIDER_SITE_OTHER): Payer: 59 | Admitting: Physician Assistant

## 2023-04-03 ENCOUNTER — Encounter: Payer: Self-pay | Admitting: Physician Assistant

## 2023-04-03 VITALS — BP 170/78 | HR 77 | Temp 98.3°F | Resp 16 | Ht 64.0 in | Wt 159.0 lb

## 2023-04-03 DIAGNOSIS — F411 Generalized anxiety disorder: Secondary | ICD-10-CM | POA: Diagnosis not present

## 2023-04-03 DIAGNOSIS — Z0000 Encounter for general adult medical examination without abnormal findings: Secondary | ICD-10-CM

## 2023-04-03 DIAGNOSIS — I771 Stricture of artery: Secondary | ICD-10-CM | POA: Diagnosis not present

## 2023-04-03 DIAGNOSIS — I10 Essential (primary) hypertension: Secondary | ICD-10-CM

## 2023-04-03 MED ORDER — CLONAZEPAM 0.5 MG PO TABS: 0.5000 mg | ORAL_TABLET | ORAL | 0 refills | Status: DC | PRN

## 2023-04-03 MED ORDER — DILTIAZEM HCL ER 240 MG PO TB24: 240.0000 mg | ORAL_TABLET | Freq: Every day | ORAL | 0 refills | Status: DC

## 2023-04-03 NOTE — Progress Notes (Signed)
[facility] [address] [place], [place] [address]  Internal MEDICINE  Office Visit Note  Patient Name: Amanda Duke  [id number]  [id number]  Date of Service: 04/11/2023  Chief Complaint  Patient presents with   Medicare Wellness   Gastroesophageal Reflux   Hypertension   Hyperlipidemia   Quality Metric Gaps    Shingles and TDAP    HPI Marvina presents for an annual well visit and physical exam.  Well-appearing 82 y.o.female Needs med refills sent Other concerns: Seeing neurology and cardiology now since TIA. Will be seeing vascular soon due to finding of right subclavian artery stenosis -Will plan on getting shingles vaccines, will check tdap -BP elevated in office and did not improve on recheck and has been elevated at home as well. Will try increasing diltiazem and monitor closely. Pt will hold on to lower dose in case of improvement and need to go back down again     04/03/2023    3:10 PM 03/28/2022    2:20 PM 01/11/2021   11:11 AM  MMSE - Mini Mental State Exam  Orientation to time 5 5 5   Orientation to Place 5 5 5   Registration 3 3 3   Attention/ Calculation 5 5 5   Recall 3 3 3   Language- name 2 objects 2 2 2   Language- repeat 1 1 1   Language- follow 3 step command 3 3 3   Language- read & follow direction 1 1 1   Write a sentence 1 1 1   Copy design 1 1 1   Total score 30 30 30     Functional Status Survey: Is the patient deaf or have difficulty hearing?: No Does the patient have difficulty seeing, even when wearing glasses/contacts?: No Does the patient have difficulty concentrating, remembering, or making decisions?: No Does the patient have difficulty walking or climbing stairs?: No Does the patient have difficulty dressing or bathing?: No Does the patient have difficulty doing errands alone such as visiting a doctor's office or shopping?: No     06/10/2022    2:23 PM 07/08/2022    9:59 AM 07/11/2022    2:54 PM 11/14/2022    1:45 PM 04/03/2023     3:10 PM  Fall Risk  Falls in the past year? 0 0 0 0 0  Was there an injury with Fall? 0      Fall Risk Category Calculator 0      Patient at Risk for Falls Due to No Fall Risks      Fall risk Follow up Falls evaluation completed           04/03/2023    3:10 PM  Depression screen PHQ 2/9  Decreased Interest 0  Down, Depressed, Hopeless 0  PHQ - 2 Score 0        No data to display            Current Medication: Outpatient Encounter Medications as of 04/03/2023  Medication Sig Note   acetaminophen (TYLENOL) 500 MG tablet Take 1,000 mg by mouth every 6 (six) hours as needed for moderate pain.    aspirin EC 81 MG tablet Take 81 mg by mouth daily. Swallow whole.    cholecalciferol (VITAMIN D) 1000 units tablet Take 1,000 Units by mouth daily.    clopidogrel (PLAVIX) 75 MG tablet Take 1 tablet (75 mg total) by mouth daily.    diltiazem (CARDIZEM LA) 240 MG 24 hr tablet Take 1 tablet (240 mg total) by mouth daily.    fluticasone (FLONASE)  50 MCG/ACT nasal spray INSTILL 1 SPRAY INTO BOTH NOSTRILS DAILY    icosapent Ethyl (VASCEPA) 1 g capsule Take 2 g by mouth 2 (two) times daily.    linaclotide (LINZESS) 290 MCG CAPS capsule Take 1 capsule (290 mcg total) by mouth daily. (Patient taking differently: Take 290 mcg by mouth daily before breakfast.)    losartan (COZAAR) 100 MG tablet Take 100 mg by mouth every morning.    ondansetron (ZOFRAN-ODT) 4 MG disintegrating tablet Take 1 tablet (4 mg total) by mouth every 8 (eight) hours as needed. (Patient taking differently: Take 4 mg by mouth every 8 (eight) hours as needed for nausea or vomiting.)    pantoprazole (PROTONIX) 40 MG tablet Take 40 mg by mouth as needed. 01/25/2023: Is taking every day now instead of prn   rosuvastatin (CRESTOR) 40 MG tablet Take 40 mg by mouth at bedtime.    senna-docusate (SENOKOT-S) 8.6-50 MG tablet Take 1 tablet by mouth at bedtime as needed for mild constipation.    solifenacin (VESICARE) 5 MG tablet  TAKE 1 TABLET (5 MG TOTAL) BY MOUTH DAILY. (Patient taking differently: Take 5 mg by mouth as needed.)    [DISCONTINUED] clonazePAM (KLONOPIN) 0.5 MG tablet TAKE 1 TABLET (0.5 MG TOTAL) BY MOUTH AS NEEDED FOR ANXIETY. TAKE 1 TABLET BY MOUTH AS NEEDED    [DISCONTINUED] diltiazem (CARDIZEM CD) 180 MG 24 hr capsule TAKE 1 CAPSULE BY MOUTH EVERY DAY    clonazePAM (KLONOPIN) 0.5 MG tablet Take 1 tablet (0.5 mg total) by mouth as needed for anxiety. Take 1 tablet by mouth as needed    No facility-administered encounter medications on file as of 04/03/2023.    Surgical History: Past Surgical History:  Procedure Laterality Date   APPENDECTOMY  1971   ARTHRODESIS  1980   BACK SURGERY  1980   COLONOSCOPY WITH PROPOFOL N/A 12/12/2016   Procedure: COLONOSCOPY WITH PROPOFOL;  Surgeon: Christena Deem, MD;  Location: [facility] ENDOSCOPY;  Service: Endoscopy;  Laterality: N/A;   COLONOSCOPY WITH PROPOFOL N/A 07/14/2019   Procedure: COLONOSCOPY WITH PROPOFOL;  Surgeon: Toledo, Boykin Nearing, MD;  Location: [facility] ENDOSCOPY;  Service: Gastroenterology;  Laterality: N/A;   ESOPHAGOGASTRODUODENOSCOPY N/A 12/27/2021   Procedure: ESOPHAGOGASTRODUODENOSCOPY (EGD);  Surgeon: Sung Amabile, DO;  Location: [facility] ENDOSCOPY;  Service: General;  Laterality: N/A;   ESOPHAGOGASTRODUODENOSCOPY (EGD) WITH PROPOFOL N/A 12/12/2016   Procedure: ESOPHAGOGASTRODUODENOSCOPY (EGD) WITH PROPOFOL;  Surgeon: Christena Deem, MD;  Location[facility] ENDOSCOPY;  Service: Endoscopy;  Laterality: N/A;   ESOPHAGOGASTRODUODENOSCOPY (EGD) WITH PROPOFOL N/A 07/14/2019   Procedure: ESOPHAGOGASTRODUODENOSCOPY (EGD) WITH PROPOFOL;  Surgeon: Toledo, Boykin Nearing, MD;  Location: [facility] ENDOSCOPY;  Service: Gastroenterology;  Laterality: N/A;   INSERTION OF MESH  02/05/2022   Procedure: INSERTION OF MESH;  Surgeon: Leafy Ro, MD;  Location: [facility] ORS;  Service: General;;   LAPAROSCOPIC UNILATERAL SALPINGO OOPHERECTOMY  1985   right   LEFT HEART CATH AND  CORONARY ANGIOGRAPHY Left 09/25/2001   Procedure: LEFT HEART CATH AND CORONARY ANGIOGRAPHY; Location: [facility]; Surgeon: Rudean Hitt, MD   LEFT HEART CATH AND CORONARY ANGIOGRAPHY Left 06/09/2003   Procedure: LEFT HEART CATH AND CORONARY ANGIOGRAPHY; Location: [facility]; Surgeon: Rudean Hitt, MD   ORIF ANKLE FRACTURE Right    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    TONSILLECTOMY  1948   TUBAL LIGATION  1971   UMBILICAL HERNIA REPAIR N/A 02/05/2022   Procedure: HERNIA REPAIR UMBILICAL ADULT;  Surgeon: Leafy Ro, MD;  Location: [facility] ORS;  Service: General;  Laterality: N/A;   VAGINAL HYSTERECTOMY  1985   d/t heavy bleeding   XI ROBOTIC ASSISTED PARAESOPHAGEAL HERNIA REPAIR N/A 02/05/2022   Procedure: XI ROBOTIC ASSISTED PARAESOPHAGEAL HERNIA REPAIR, RNFA to assist;  Surgeon: Leafy Ro, MD;  Location: [facility] ORS;  Service: General;  Laterality: N/A;    Medical History: Past Medical History:  Diagnosis Date   Anxiety    Aortic atherosclerosis (HCC)    Arthritis    Bilateral renal cysts    Coronary artery disease 09/25/2001   a.) LHC 09/25/2001: EF 78%. 20% pLAD - med mgmt; b.) LHC 06/09/2003: EF 63%. 20% pLCx, 20% pLAD, 50% mLAD, 50% dLAD - med mgmt.   Diastolic dysfunction 12/30/2017   a.) Stress echo 12/30/2017: ED >55%, triv PR, mild AR.MR, mod TR, G1DD; b.) TTE 03/30/2020: EF >55%, mild LVH, mild panvalvular regurgitation.   Gastritis    GERD (gastroesophageal reflux disease)    Hepatic steatosis    History of 2019 novel coronavirus disease (COVID-19) 01/11/2022   History of hiatal hernia    a.) s/p repair 01/2022   Hyperlipemia    Hyperplastic colon polyp    Hypertension    Hypoglycemia 05/2022   this is something new-seeing pcp for this currently   Pneumonia    PONV (postoperative nausea and vomiting)    a.) PONV following orthopedic surgery (hip) in 2022   Prediabetes    PSVT (paroxysmal supraventricular tachycardia) (HCC)    Redundant colon     Tubular adenoma of colon    Umbilical hernia     Family History: Family History  Problem Relation Age of Onset   Heart failure Mother    Stroke Mother    Breast cancer Mother        dx at age 31 yo   Diabetes Mother    Osteoporosis Mother    Emphysema Father    Ovarian cancer Sister        dx at age 56 yo   Asthma Sister    Diabetes Daughter    Heart disease Daughter    COPD Daughter     Social History   Socioeconomic History   Marital status: Widowed    Spouse name: Not on file   Number of children: Not on file   Years of education: Not on file   Highest education level: Not on file  Occupational History   Not on file  Tobacco Use   Smoking status: Former    Current packs/day: 0.00    Average packs/day: 0.5 packs/day for 5.0 years (2.5 ttl pk-yrs)    Types: Cigarettes    Start date: 37    Quit date: 22    Years since quitting: 44.9    Passive exposure: Past   Smokeless tobacco: Never  Vaping Use   Vaping status: Never Used  Substance and Sexual Activity   Alcohol use: No    Alcohol/week: 0.0 standard drinks of alcohol   Drug use: Never   Sexual activity: Not Currently  Other Topics Concern   Not on file  Social History Narrative   Not on file   Social Drivers of Health   Financial Resource Strain: Low Risk  (11/17/2020)   Overall Financial Resource Strain (CARDIA)    Difficulty of Paying Living Expenses: Not very hard  Food Insecurity: No Food Insecurity (01/25/2023)   Hunger Vital Sign    Worried About Running Out of Food in the Last Year: Never true    Ran Out  of Food in the Last Year: Never true  Transportation Needs: No Transportation Needs (01/25/2023)   PRAPARE - Administrator, Civil Service (Medical): No    Lack of Transportation (Non-Medical): No  Physical Activity: Inactive (01/10/2019)   Exercise Vital Sign    Days of Exercise per Week: 0 days    Minutes of Exercise per Session: 0 min  Stress: No Stress Concern Present  (01/10/2019)   [employer] of Occupational Health - Occupational Stress Questionnaire    Feeling of Stress : Not at all  Social Connections: Socially Isolated (01/10/2019)   Social Connection and Isolation Panel [NHANES]    Frequency of Communication with Friends and Family: Three times a week    Frequency of Social Gatherings with Friends and Family: Three times a week    Attends Religious Services: Never    Active Member of Clubs or Organizations: No    Attends Banker Meetings: Never    Marital Status: Widowed  Intimate Partner Violence: Unknown (01/25/2023)   Humiliation, Afraid, Rape, and Kick questionnaire    Fear of Current or Ex-Partner: No    Emotionally Abused: No    Physically Abused: Not on file    Sexually Abused: No      Review of Systems  Constitutional:  Negative for chills, fatigue and unexpected weight change.  HENT:  Negative for congestion, dental problem, postnasal drip, rhinorrhea, sneezing and sore throat.   Eyes:  Negative for redness.  Respiratory: Negative.  Negative for cough, chest tightness, shortness of breath and wheezing.   Cardiovascular: Negative.  Negative for chest pain and palpitations.  Gastrointestinal:  Negative for abdominal pain, constipation, diarrhea, nausea and vomiting.  Genitourinary:  Negative for dysuria and frequency.  Musculoskeletal:  Negative for arthralgias, back pain, joint swelling and neck pain.  Skin:  Negative for rash.  Neurological: Negative.  Negative for tremors and numbness.  Hematological:  Negative for adenopathy. Does not bruise/bleed easily.  Psychiatric/Behavioral:  Negative for behavioral problems (Depression), sleep disturbance and suicidal ideas. The patient is nervous/anxious.     Vital Signs: BP (!) 170/78   Pulse 77   Temp 98.3 F (36.8 C)   Resp 16   Ht 5\' 4"  (1.626 m)   Wt 159 lb (72.1 kg)   SpO2 97%   BMI 27.29 kg/m    Physical Exam Vitals and nursing note reviewed.   Constitutional:      Appearance: Normal appearance.  HENT:     Head: Normocephalic and atraumatic.  Eyes:     Pupils: Pupils are equal, round, and reactive to light.  Cardiovascular:     Rate and Rhythm: Normal rate and regular rhythm.  Pulmonary:     Effort: Pulmonary effort is normal.     Breath sounds: Normal breath sounds.  Skin:    General: Skin is warm and dry.  Neurological:     Mental Status: She is alert.  Psychiatric:        Mood and Affect: Mood normal.        Behavior: Behavior normal.        Assessment/Plan: 1. Encounter for Medicare annual wellness exam (Primary) AWV performed, UTD on PHM apart from vaccines and will check on these  2. Benign essential HTN Very elevated in office, will increase diltiazem and pt will monitor closely. Call if any problems  3. Generalized anxiety disorder - clonazePAM (KLONOPIN) 0.5 MG tablet; Take 1 tablet (0.5 mg total) by mouth as needed for anxiety.  Take 1 tablet by mouth as needed  Dispense: 30 tablet; Refill: 0  4. Subclavian artery stenosis, right (HCC) Has appt with vascular soon    General Counseling: Jadeyn verbalizes understanding of the findings of todays visit and agrees with plan of treatment. I have discussed any further diagnostic evaluation that may be needed or ordered today. We also reviewed her medications today. she has been encouraged to call the office with any questions or concerns that should arise related to todays visit.    No orders of the defined types were placed in this encounter.   Meds ordered this encounter  Medications   clonazePAM (KLONOPIN) 0.5 MG tablet    Sig: Take 1 tablet (0.5 mg total) by mouth as needed for anxiety. Take 1 tablet by mouth as needed    Dispense:  30 tablet    Refill:  0    This request is for a new prescription for a controlled substance as required by Federal/State law. DX Code Needed  .   diltiazem (CARDIZEM LA) 240 MG 24 hr tablet    Sig: Take 1 tablet (240  mg total) by mouth daily.    Dispense:  30 tablet    Refill:  0    Return in about 4 weeks (around 05/01/2023) for HTN.   Total time spent:35 Minutes Time spent includes review of chart, medications, test results, and follow up plan with the patient.   [place] Controlled Substance Database was reviewed by me.  This patient was seen by Lynn Ito, PA-C in collaboration with Dr. Beverely Risen as a part of collaborative care agreement.  Lynn Ito, PA-C Internal medicine

## 2023-04-08 ENCOUNTER — Ambulatory Visit (INDEPENDENT_AMBULATORY_CARE_PROVIDER_SITE_OTHER): Payer: 59 | Admitting: Vascular Surgery

## 2023-04-08 ENCOUNTER — Encounter (INDEPENDENT_AMBULATORY_CARE_PROVIDER_SITE_OTHER): Payer: Self-pay | Admitting: Vascular Surgery

## 2023-04-08 VITALS — BP 147/85 | HR 72 | Resp 16 | Wt 159.0 lb

## 2023-04-08 DIAGNOSIS — I6523 Occlusion and stenosis of bilateral carotid arteries: Secondary | ICD-10-CM

## 2023-04-08 DIAGNOSIS — I6529 Occlusion and stenosis of unspecified carotid artery: Secondary | ICD-10-CM | POA: Insufficient documentation

## 2023-04-08 DIAGNOSIS — E782 Mixed hyperlipidemia: Secondary | ICD-10-CM | POA: Diagnosis not present

## 2023-04-08 DIAGNOSIS — I771 Stricture of artery: Secondary | ICD-10-CM | POA: Diagnosis not present

## 2023-04-08 DIAGNOSIS — I10 Essential (primary) hypertension: Secondary | ICD-10-CM

## 2023-04-08 NOTE — Progress Notes (Unsigned)
Patient ID: Amanda Duke, female   DOB: 15-May-1940, 82 y.o.   MRN: [id number]  Chief Complaint  Patient presents with   New Patient (Initial Visit)    Ref Marlene Bast consult SCA stenosis    HPI Amanda Duke is a 82 y.o. female.  I am asked to see the patient by Nilda Calamity for evaluation of right subclavian artery stenosis seen on a recent CT angiogram which I have independently reviewed.  She had a TIA with right arm and leg weakness that resolved within minutes to hours.  She was seen at the hospital and a workup was performed.  One of the studies included a CT angiogram of the neck which I have independently reviewed.  She had mild carotid disease that was not felt to be more than 50% on either side with the right side being a little worse than the left.  I think that the right side is at least approaching 50% although really not high-grade.  The left subclavian artery has very mild disease but the right subclavian artery was interpreted as a 75% stenosis.  This seems to be a reasonable estimate per my review.  The patient denies any significant right arm claudication symptoms, ulceration, or infection of the right upper extremity.  She does not have significant vertebrobasilar symptoms.   Past Medical History:  Diagnosis Date   Anxiety    Aortic atherosclerosis (HCC)    Arthritis    Bilateral renal cysts    Coronary artery disease 09/25/2001   a.) LHC 09/25/2001: EF 78%. 20% pLAD - med mgmt; b.) LHC 06/09/2003: EF 63%. 20% pLCx, 20% pLAD, 50% mLAD, 50% dLAD - med mgmt.   Diastolic dysfunction 12/30/2017   a.) Stress echo 12/30/2017: ED >55%, triv PR, mild AR.MR, mod TR, G1DD; b.) TTE 03/30/2020: EF >55%, mild LVH, mild panvalvular regurgitation.   Gastritis    GERD (gastroesophageal reflux disease)    Hepatic steatosis    History of 2019 novel coronavirus disease (COVID-19) 01/11/2022   History of hiatal hernia    a.) s/p repair 01/2022   Hyperlipemia    Hyperplastic colon  polyp    Hypertension    Hypoglycemia 05/2022   this is something new-seeing pcp for this currently   Pneumonia    PONV (postoperative nausea and vomiting)    a.) PONV following orthopedic surgery (hip) in 2022   Prediabetes    PSVT (paroxysmal supraventricular tachycardia) (HCC)    Redundant colon    Tubular adenoma of colon    Umbilical hernia     Past Surgical History:  Procedure Laterality Date   APPENDECTOMY  1971   ARTHRODESIS  1980   BACK SURGERY  1980   COLONOSCOPY WITH PROPOFOL N/A 12/12/2016   Procedure: COLONOSCOPY WITH PROPOFOL;  Surgeon: Christena Deem, MD;  Location: [facility] ENDOSCOPY;  Service: Endoscopy;  Laterality: N/A;   COLONOSCOPY WITH PROPOFOL N/A 07/14/2019   Procedure: COLONOSCOPY WITH PROPOFOL;  Surgeon: Toledo, Boykin Nearing, MD;  Location: [facility] ENDOSCOPY;  Service: Gastroenterology;  Laterality: N/A;   ESOPHAGOGASTRODUODENOSCOPY N/A 12/27/2021   Procedure: ESOPHAGOGASTRODUODENOSCOPY (EGD);  Surgeon: Sung Amabile, DO;  Location: [facility] ENDOSCOPY;  Service: General;  Laterality: N/A;   ESOPHAGOGASTRODUODENOSCOPY (EGD) WITH PROPOFOL N/A 12/12/2016   Procedure: ESOPHAGOGASTRODUODENOSCOPY (EGD) WITH PROPOFOL;  Surgeon: Christena Deem, MD;  Location: [facility] ENDOSCOPY;  Service: Endoscopy;  Laterality: N/A;   ESOPHAGOGASTRODUODENOSCOPY (EGD) WITH PROPOFOL N/A 07/14/2019   Procedure: ESOPHAGOGASTRODUODENOSCOPY (EGD) WITH PROPOFOL;  Surgeon: Norma Fredrickson, Boykin Nearing, MD;  Location: [facility] ENDOSCOPY;  Service: Gastroenterology;  Laterality: N/A;   INSERTION OF MESH  02/05/2022   Procedure: INSERTION OF MESH;  Surgeon: Leafy Ro, MD;  Location: [facility] ORS;  Service: General;;   LAPAROSCOPIC UNILATERAL SALPINGO OOPHERECTOMY  1985   right   LEFT HEART CATH AND CORONARY ANGIOGRAPHY Left 09/25/2001   Procedure: LEFT HEART CATH AND CORONARY ANGIOGRAPHY; Location: [facility]; Surgeon: Rudean Hitt, MD   LEFT HEART CATH AND CORONARY ANGIOGRAPHY Left 06/09/2003   Procedure: LEFT HEART  CATH AND CORONARY ANGIOGRAPHY; Location: [facility]; Surgeon: Rudean Hitt, MD   ORIF ANKLE FRACTURE Right    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    TONSILLECTOMY  1948   TUBAL LIGATION  1971   UMBILICAL HERNIA REPAIR N/A 02/05/2022   Procedure: HERNIA REPAIR UMBILICAL ADULT;  Surgeon: Leafy Ro, MD;  Location: [facility] ORS;  Service: General;  Laterality: N/A;   VAGINAL HYSTERECTOMY  1985   d/t heavy bleeding   XI ROBOTIC ASSISTED PARAESOPHAGEAL HERNIA REPAIR N/A 02/05/2022   Procedure: XI ROBOTIC ASSISTED PARAESOPHAGEAL HERNIA REPAIR, RNFA to assist;  Surgeon: Leafy Ro, MD;  Location: [facility] ORS;  Service: General;  Laterality: N/A;     Family History  Problem Relation Age of Onset   Heart failure Mother    Stroke Mother    Breast cancer Mother        dx at age 47 yo   Diabetes Mother    Osteoporosis Mother    Emphysema Father    Ovarian cancer Sister        dx at age 64 yo   Asthma Sister    Diabetes Daughter    Heart disease Daughter    COPD Daughter       Social History   Tobacco Use   Smoking status: Former    Current packs/day: 0.00    Average packs/day: 0.5 packs/day for 5.0 years (2.5 ttl pk-yrs)    Types: Cigarettes    Start date: 96    Quit date: 1980    Years since quitting: 44.9    Passive exposure: Past   Smokeless tobacco: Never  Vaping Use   Vaping status: Never Used  Substance Use Topics   Alcohol use: No    Alcohol/week: 0.0 standard drinks of alcohol   Drug use: Never     Allergies  Allergen Reactions   Amoxicillin Hives    Patient tolerated Ceftriaxone in ED on 01/10/19.    Clindamycin Diarrhea   Gemfibrozil Other (See Comments)    muscle ache   Levofloxacin Other (See Comments)    Muscle ache for several months   Metoprolol Other (See Comments)    "Lowers blood pressure extremely low"   Simvastatin Other (See Comments)    muscle ache   Macrobid [Nitrofurantoin Macrocrystal] Diarrhea   Niacin Rash   Nystatin  Rash   Nystatin-Triamcinolone Rash   Sulfa Antibiotics Rash    Family history of medication allergy    Current Outpatient Medications  Medication Sig Dispense Refill   acetaminophen (TYLENOL) 500 MG tablet Take 1,000 mg by mouth every 6 (six) hours as needed for moderate pain.     aspirin EC 81 MG tablet Take 81 mg by mouth daily. Swallow whole.     cholecalciferol (VITAMIN D) 1000 units tablet Take 1,000 Units by mouth daily.     clonazePAM (KLONOPIN) 0.5 MG tablet Take 1 tablet (0.5 mg total) by mouth as needed for anxiety. Take 1 tablet by mouth  as needed 30 tablet 0   clopidogrel (PLAVIX) 75 MG tablet Take 1 tablet (75 mg total) by mouth daily. 21 tablet 0   diltiazem (CARDIZEM LA) 240 MG 24 hr tablet Take 1 tablet (240 mg total) by mouth daily. 30 tablet 0   fluticasone (FLONASE) 50 MCG/ACT nasal spray INSTILL 1 SPRAY INTO BOTH NOSTRILS DAILY 24 mL 2   icosapent Ethyl (VASCEPA) 1 g capsule Take 2 g by mouth 2 (two) times daily.     linaclotide (LINZESS) 290 MCG CAPS capsule Take 1 capsule (290 mcg total) by mouth daily. (Patient taking differently: Take 290 mcg by mouth daily before breakfast.) 30 capsule 3   losartan (COZAAR) 100 MG tablet Take 100 mg by mouth every morning.     ondansetron (ZOFRAN-ODT) 4 MG disintegrating tablet Take 1 tablet (4 mg total) by mouth every 8 (eight) hours as needed. (Patient taking differently: Take 4 mg by mouth every 8 (eight) hours as needed for nausea or vomiting.) 20 tablet 0   pantoprazole (PROTONIX) 40 MG tablet Take 40 mg by mouth as needed.     rosuvastatin (CRESTOR) 40 MG tablet Take 40 mg by mouth at bedtime.     senna-docusate (SENOKOT-S) 8.6-50 MG tablet Take 1 tablet by mouth at bedtime as needed for mild constipation. 20 tablet 0   solifenacin (VESICARE) 5 MG tablet TAKE 1 TABLET (5 MG TOTAL) BY MOUTH DAILY. (Patient taking differently: Take 5 mg by mouth as needed.) 90 tablet 1   No current facility-administered medications for this visit.       REVIEW OF SYSTEMS (Negative unless checked)  Constitutional: [] Weight loss  [] Fever  [] Chills Cardiac: [] Chest pain   [] Chest pressure   [] Palpitations   [] Shortness of breath when laying flat   [] Shortness of breath at rest   [] Shortness of breath with exertion. Vascular:  [] Pain in legs with walking   [] Pain in legs at rest   [] Pain in legs when laying flat   [] Claudication   [] Pain in feet when walking  [] Pain in feet at rest  [] Pain in feet when laying flat   [] History of DVT   [] Phlebitis   [] Swelling in legs   [] Varicose veins   [] Non-healing ulcers Pulmonary:   [] Uses home oxygen   [] Productive cough   [] Hemoptysis   [] Wheeze  [] COPD   [] Asthma Neurologic:  [] Dizziness  [] Blackouts   [] Seizures   [] History of stroke   [x] History of TIA  [] Aphasia   [] Temporary blindness   [] Dysphagia   [] Weakness or numbness in arms   [] Weakness or numbness in legs Musculoskeletal:  [x] Arthritis   [] Joint swelling   [x] Joint pain   [] Low back pain Hematologic:  [] Easy bruising  [] Easy bleeding   [] Hypercoagulable state   [] Anemic  [] Hepatitis Gastrointestinal:  [] Blood in stool   [] Vomiting blood  [x] Gastroesophageal reflux/heartburn   [] Abdominal pain Genitourinary:  [] Chronic kidney disease   [] Difficult urination  [] Frequent urination  [] Burning with urination   [] Hematuria Skin:  [] Rashes   [] Ulcers   [] Wounds Psychological:  [x] History of anxiety   []  History of major depression.    Physical Exam BP (!) 147/85 (BP Location: Left Arm)   Pulse 72   Resp 16   Wt 159 lb (72.1 kg)   BMI 27.29 kg/m  Gen:  WD/WN, NAD.  Appears younger than her stated age. Head: [place]/AT, No temporalis wasting.  Ear/Nose/Throat: Hearing grossly intact, nares w/o erythema or drainage, oropharynx w/o Erythema/Exudate Eyes: Conjunctiva clear, sclera non-icteric  Neck: trachea midline.  No JVD.  Pulmonary:  Good air movement, respirations not labored, no use of accessory muscles  Cardiac: RRR, no JVD Vascular:   Vessel Right Left  Radial 2+ Palpable 2+ Palpable                                   Gastrointestinal:. No masses, surgical incisions, or scars. Musculoskeletal: M/S 5/5 throughout.  Extremities without ischemic changes.  No deformity or atrophy. No LE edema. Neurologic: Sensation grossly intact in extremities.  Symmetrical.  Speech is fluent. Motor exam as listed above. Psychiatric: Judgment intact, Mood & affect appropriate for pt's clinical situation. Dermatologic: No rashes or ulcers noted.  No cellulitis or open wounds.    Radiology No results found.  Labs Recent Results (from the past 2160 hour(s))  CBG monitoring, ED     Status: Abnormal   Collection Time: 01/25/23  5:44 AM  Result Value Ref Range   Glucose-Capillary 104 (H) 70 - 99 mg/dL    Comment: Glucose reference range applies only to samples taken after fasting for at least 8 hours.  Protime-INR     Status: None   Collection Time: 01/25/23  5:53 AM  Result Value Ref Range   Prothrombin Time 12.7 11.4 - 15.2 seconds   INR 0.9 0.8 - 1.2    Comment: (NOTE) INR goal varies based on device and disease states. Performed at [facility], [address] [place], [place] [address]   APTT     Status: None   Collection Time: 01/25/23  5:53 AM  Result Value Ref Range   aPTT 26 24 - 36 seconds    Comment: Performed at [facility], [address] [place], [place] [address]  CBC     Status: None   Collection Time: 01/25/23  5:53 AM  Result Value Ref Range   WBC 6.8 4.0 - 10.5 K/uL   RBC 4.95 3.87 - 5.11 MIL/uL   Hemoglobin 14.3 12.0 - 15.0 g/dL   HCT 82.9 56.2 - 13.0 %   MCV 86.7 80.0 - 100.0 fL   MCH 28.9 26.0 - 34.0 pg   MCHC 33.3 30.0 - 36.0 g/dL   RDW 86.5 78.4 - 69.6 %   Platelets 305 150 - 400 K/uL   nRBC 0.0 0.0 - 0.2 %    Comment: Performed at [facility], [address] [place], [place] [address]  Differential     Status: None   Collection Time: 01/25/23  5:53 AM   Result Value Ref Range   Neutrophils Relative % 42 %   Neutro Abs 2.8 1.7 - 7.7 K/uL   Lymphocytes Relative 42 %   Lymphs Abs 2.8 0.7 - 4.0 K/uL   Monocytes Relative 12 %   Monocytes Absolute 0.8 0.1 - 1.0 K/uL   Eosinophils Relative 3 %   Eosinophils Absolute 0.2 0.0 - 0.5 K/uL   Basophils Relative 1 %   Basophils Absolute 0.1 0.0 - 0.1 K/uL   Immature Granulocytes 0 %   Abs Immature Granulocytes 0.03 0.00 - 0.07 K/uL    Comment: Performed at [facility], [address] [place], [place] [address]  Comprehensive metabolic panel     Status: Abnormal   Collection Time: 01/25/23  5:53 AM  Result Value Ref Range   Sodium 138 135 - 145 mmol/L   Potassium 3.5 3.5 - 5.1 mmol/L   Chloride  105 98 - 111 mmol/L   CO2 23 22 - 32 mmol/L   Glucose, Bld 120 (H) 70 - 99 mg/dL    Comment: Glucose reference range applies only to samples taken after fasting for at least 8 hours.   BUN 16 8 - 23 mg/dL   Creatinine, Ser 1.61 0.44 - 1.00 mg/dL   Calcium 9.0 8.9 - 09.6 mg/dL   Total Protein 7.6 6.5 - 8.1 g/dL   Albumin 4.1 3.5 - 5.0 g/dL   AST 22 15 - 41 U/L   ALT 13 0 - 44 U/L   Alkaline Phosphatase 85 38 - 126 U/L   Total Bilirubin 0.6 0.3 - 1.2 mg/dL   GFR, Estimated 56 (L) >60 mL/min    Comment: (NOTE) Calculated using the CKD-EPI Creatinine Equation (2021)    Anion gap 10 5 - 15    Comment: Performed at [facility], [address], [place], [place] [address]  Ethanol     Status: None   Collection Time: 01/25/23  9:18 AM  Result Value Ref Range   Alcohol, Ethyl (B) <10 <10 mg/dL    Comment: (NOTE) Lowest detectable limit for serum alcohol is 10 mg/dL.  For medical purposes only. Performed at [facility], [address] [place], [place] [address]   Lipid panel     Status: Abnormal   Collection Time: 01/25/23  9:18 AM  Result Value Ref Range   Cholesterol 210 (H) 0 - 200 mg/dL   Triglycerides 147 <829 mg/dL   HDL 58 >56 mg/dL   Total CHOL/HDL  Ratio 3.6 RATIO   VLDL 21 0 - 40 mg/dL   LDL Cholesterol 213 (H) 0 - 99 mg/dL    Comment:        Total Cholesterol/HDL:CHD Risk Coronary Heart Disease Risk Table                     Men   Women  1/2 Average Risk   3.4   3.3  Average Risk       5.0   4.4  2 X Average Risk   9.6   7.1  3 X Average Risk  23.4   11.0        Use the calculated Patient Ratio above and the CHD Risk Table to determine the patient's CHD Risk.        ATP III CLASSIFICATION (LDL):  <100     mg/dL   Optimal  086-578  mg/dL   Near or Above                    Optimal  130-159  mg/dL   Borderline  469-629  mg/dL   High  >528     mg/dL   Very High Performed at [facility], [address] [place], [place] [address]   Urinalysis, Complete w Microscopic -Urine, Clean Catch     Status: Abnormal   Collection Time: 01/25/23 12:42 PM  Result Value Ref Range   Color, Urine STRAW (A) YELLOW   APPearance CLEAR (A) CLEAR   Specific Gravity, Urine 1.004 (L) 1.005 - 1.030   pH 8.0 5.0 - 8.0   Glucose, UA NEGATIVE NEGATIVE mg/dL   Hgb urine dipstick NEGATIVE NEGATIVE   Bilirubin Urine NEGATIVE NEGATIVE   Ketones, ur NEGATIVE NEGATIVE mg/dL   Protein, ur NEGATIVE NEGATIVE mg/dL   Nitrite NEGATIVE NEGATIVE   Leukocytes,Ua NEGATIVE NEGATIVE   RBC / HPF 0-5 0 -  5 RBC/hpf   WBC, UA 0-5 0 - 5 WBC/hpf   Bacteria, UA NONE SEEN NONE SEEN   Squamous Epithelial / HPF 0-5 0 - 5 /HPF    Comment: Performed at [facility], [address] [place], [place] [address]  ECHOCARDIOGRAM COMPLETE     Status: None   Collection Time: 01/25/23  3:33 PM  Result Value Ref Range   Weight 2,614.4 oz   Height 65 in   BP 151/79 mmHg   Ao pk vel 1.84 m/s   AR max vel 1.88 cm2   AV Peak grad 13.5 mmHg   S' Lateral 2.10 cm   Area-P 1/2 2.26 cm2   P 1/2 time 624 msec   Est EF 65 - 70%   Hemoglobin A1c     Status: Abnormal   Collection Time: 01/26/23  4:48 AM  Result Value Ref Range   Hgb A1c MFr Bld 5.8 (H) 4.8 -  5.6 %    Comment: (NOTE) Pre diabetes:          5.7%-6.4%  Diabetes:              >6.4%  Glycemic control for   <7.0% adults with diabetes    Mean Plasma Glucose 119.76 mg/dL    Comment: Performed at [facility] [place], [address] [place], [place] [address]  Basic metabolic panel     Status: Abnormal   Collection Time: 02/08/23 10:45 AM  Result Value Ref Range   Sodium 136 135 - 145 mmol/L   Potassium 3.8 3.5 - 5.1 mmol/L   Chloride 100 98 - 111 mmol/L   CO2 26 22 - 32 mmol/L   Glucose, Bld 130 (H) 70 - 99 mg/dL    Comment: Glucose reference range applies only to samples taken after fasting for at least 8 hours.   BUN 14 8 - 23 mg/dL   Creatinine, Ser 1.47 (H) 0.44 - 1.00 mg/dL   Calcium 9.6 8.9 - 82.9 mg/dL   GFR, Estimated 43 (L) >60 mL/min    Comment: (NOTE) Calculated using the CKD-EPI Creatinine Equation (2021)    Anion gap 10 5 - 15    Comment: Performed at [facility], [address] [place], [place] [address]  CBC     Status: Abnormal   Collection Time: 02/08/23 10:45 AM  Result Value Ref Range   WBC 10.9 (H) 4.0 - 10.5 K/uL   RBC 5.16 (H) 3.87 - 5.11 MIL/uL   Hemoglobin 14.8 12.0 - 15.0 g/dL   HCT 08.6 57.8 - 46.9 %   MCV 85.5 80.0 - 100.0 fL   MCH 28.7 26.0 - 34.0 pg   MCHC 33.6 30.0 - 36.0 g/dL   RDW 62.9 52.8 - 41.3 %   Platelets 302 150 - 400 K/uL   nRBC 0.0 0.0 - 0.2 %    Comment: Performed at [facility], [address] [place], [place] [address]  Hepatic function panel     Status: None   Collection Time: 02/08/23 10:45 AM  Result Value Ref Range   Total Protein 7.9 6.5 - 8.1 g/dL   Albumin 4.2 3.5 - 5.0 g/dL   AST 22 15 - 41 U/L   ALT 16 0 - 44 U/L   Alkaline Phosphatase 98 38 - 126 U/L   Total Bilirubin 0.8 0.3 - 1.2 mg/dL   Bilirubin, Direct <0.2 0.0 - 0.2 mg/dL   Indirect Bilirubin NOT CALCULATED 0.3 - 0.9 mg/dL  Comment: Performed at [facility], [address] [place], [place] [address]  Lipase,  blood     Status: None   Collection Time: 02/08/23 10:45 AM  Result Value Ref Range   Lipase 23 11 - 51 U/L    Comment: Performed at [facility], [address] [place], [place] [address]  Troponin I (High Sensitivity)     Status: None   Collection Time: 02/08/23 10:45 AM  Result Value Ref Range   Troponin I (High Sensitivity) 7 <18 ng/L    Comment: (NOTE) Elevated high sensitivity troponin I (hsTnI) values and significant  changes across serial measurements may suggest ACS but many other  chronic and acute conditions are known to elevate hsTnI results.  Refer to the "Links" section for chest pain algorithms and additional  guidance. Performed at [facility], [address] [place], [place] [address]   CBG monitoring, ED     Status: Abnormal   Collection Time: 02/08/23 10:58 AM  Result Value Ref Range   Glucose-Capillary 106 (H) 70 - 99 mg/dL    Comment: Glucose reference range applies only to samples taken after fasting for at least 8 hours.    Assessment/Plan:  Subclavian artery stenosis, right (HCC) CT angiogram of the neck which I have independently reviewed.  She had mild carotid disease that was not felt to be more than 50% on either side with the right side being a little worse than the left.  I think that the right side is at least approaching 50% although really not high-grade.  The left subclavian artery has very mild disease but the right subclavian artery was interpreted as a 75% stenosis.  This seems to be a reasonable estimate per my review. I had a long discussion with the patient today regarding these findings.  Most people with subclavian artery stenosis do not have severe symptoms requiring treatment, but occasionally significant vertebrobasilar symptoms or arm claudication or weakness prompt treatment.  She does not have these.  She actually still has a fairly strong radial pulse and has not previously been noted to have disparate findings from  her blood pressure.  We did discuss that her left arm blood pressure would be her true blood pressure.  We will not plan any intervention for this.  We will be following this with duplex when we check her carotid arteries as well in a few months.  Benign essential HTN blood pressure control important in reducing the progression of atherosclerotic disease. On appropriate oral medications.   Carotid stenosis CT angiogram of the neck which I have independently reviewed.  She had mild carotid disease that was not felt to be more than 50% on either side with the right side being a little worse than the left.  I think that the right side is at least approaching 50% although really not high-grade.  The left subclavian artery has very mild disease but the right subclavian artery was interpreted as a 75% stenosis.  This seems to be a reasonable estimate per my review. No role for intervention at this degree of stenosis.  She is on antiplatelet therapy and a statin agent.  We will plan to follow her up in 3 to 4 months with a carotid duplex and continue to follow this ongoing.  Mixed hyperlipidemia lipid control important in reducing the progression of atherosclerotic disease. Continue statin therapy      Festus Barren 04/08/2023, 3:55 PM   This note was created with Dragon medical  transcription system.  Any errors from dictation are unintentional.

## 2023-04-08 NOTE — Assessment & Plan Note (Signed)
CT angiogram of the neck which I have independently reviewed.  She had mild carotid disease that was not felt to be more than 50% on either side with the right side being a little worse than the left.  I think that the right side is at least approaching 50% although really not high-grade.  The left subclavian artery has very mild disease but the right subclavian artery was interpreted as a 75% stenosis.  This seems to be a reasonable estimate per my review. No role for intervention at this degree of stenosis.  She is on antiplatelet therapy and a statin agent.  We will plan to follow her up in 3 to 4 months with a carotid duplex and continue to follow this ongoing.

## 2023-04-08 NOTE — Assessment & Plan Note (Signed)
blood pressure control important in reducing the progression of atherosclerotic disease. On appropriate oral medications.  

## 2023-04-08 NOTE — Assessment & Plan Note (Signed)
lipid control important in reducing the progression of atherosclerotic disease. Continue statin therapy  

## 2023-04-08 NOTE — Assessment & Plan Note (Signed)
CT angiogram of the neck which I have independently reviewed.  She had mild carotid disease that was not felt to be more than 50% on either side with the right side being a little worse than the left.  I think that the right side is at least approaching 50% although really not high-grade.  The left subclavian artery has very mild disease but the right subclavian artery was interpreted as a 75% stenosis.  This seems to be a reasonable estimate per my review. I had a long discussion with the patient today regarding these findings.  Most people with subclavian artery stenosis do not have severe symptoms requiring treatment, but occasionally significant vertebrobasilar symptoms or arm claudication or weakness prompt treatment.  She does not have these.  She actually still has a fairly strong radial pulse and has not previously been noted to have disparate findings from her blood pressure.  We did discuss that her left arm blood pressure would be her true blood pressure.  We will not plan any intervention for this.  We will be following this with duplex when we check her carotid arteries as well in a few months.

## 2023-04-17 DIAGNOSIS — I708 Atherosclerosis of other arteries: Secondary | ICD-10-CM | POA: Diagnosis not present

## 2023-04-17 DIAGNOSIS — I6523 Occlusion and stenosis of bilateral carotid arteries: Secondary | ICD-10-CM | POA: Diagnosis not present

## 2023-04-17 DIAGNOSIS — G459 Transient cerebral ischemic attack, unspecified: Secondary | ICD-10-CM | POA: Diagnosis not present

## 2023-04-17 DIAGNOSIS — E785 Hyperlipidemia, unspecified: Secondary | ICD-10-CM | POA: Diagnosis not present

## 2023-04-17 DIAGNOSIS — Z7982 Long term (current) use of aspirin: Secondary | ICD-10-CM | POA: Diagnosis not present

## 2023-04-17 DIAGNOSIS — I351 Nonrheumatic aortic (valve) insufficiency: Secondary | ICD-10-CM | POA: Diagnosis not present

## 2023-04-17 DIAGNOSIS — I2510 Atherosclerotic heart disease of native coronary artery without angina pectoris: Secondary | ICD-10-CM | POA: Diagnosis not present

## 2023-04-17 DIAGNOSIS — I700 Atherosclerosis of aorta: Secondary | ICD-10-CM | POA: Diagnosis not present

## 2023-04-17 DIAGNOSIS — I071 Rheumatic tricuspid insufficiency: Secondary | ICD-10-CM | POA: Diagnosis not present

## 2023-04-17 DIAGNOSIS — I10 Essential (primary) hypertension: Secondary | ICD-10-CM | POA: Diagnosis not present

## 2023-04-17 DIAGNOSIS — I471 Supraventricular tachycardia, unspecified: Secondary | ICD-10-CM | POA: Diagnosis not present

## 2023-04-17 DIAGNOSIS — I340 Nonrheumatic mitral (valve) insufficiency: Secondary | ICD-10-CM | POA: Diagnosis not present

## 2023-05-01 ENCOUNTER — Other Ambulatory Visit: Payer: Self-pay | Admitting: Physician Assistant

## 2023-05-08 ENCOUNTER — Encounter: Payer: Self-pay | Admitting: Physician Assistant

## 2023-05-08 ENCOUNTER — Ambulatory Visit (INDEPENDENT_AMBULATORY_CARE_PROVIDER_SITE_OTHER): Payer: 59 | Admitting: Physician Assistant

## 2023-05-08 VITALS — BP 120/79 | HR 74 | Temp 97.8°F | Resp 16 | Ht 64.0 in | Wt 160.8 lb

## 2023-05-08 DIAGNOSIS — R5383 Other fatigue: Secondary | ICD-10-CM | POA: Diagnosis not present

## 2023-05-08 DIAGNOSIS — E538 Deficiency of other specified B group vitamins: Secondary | ICD-10-CM | POA: Diagnosis not present

## 2023-05-08 DIAGNOSIS — Z1329 Encounter for screening for other suspected endocrine disorder: Secondary | ICD-10-CM

## 2023-05-08 DIAGNOSIS — R6889 Other general symptoms and signs: Secondary | ICD-10-CM | POA: Diagnosis not present

## 2023-05-08 DIAGNOSIS — N390 Urinary tract infection, site not specified: Secondary | ICD-10-CM | POA: Diagnosis not present

## 2023-05-08 DIAGNOSIS — R300 Dysuria: Secondary | ICD-10-CM | POA: Diagnosis not present

## 2023-05-08 DIAGNOSIS — R7303 Prediabetes: Secondary | ICD-10-CM | POA: Diagnosis not present

## 2023-05-08 DIAGNOSIS — I10 Essential (primary) hypertension: Secondary | ICD-10-CM

## 2023-05-08 DIAGNOSIS — E782 Mixed hyperlipidemia: Secondary | ICD-10-CM | POA: Diagnosis not present

## 2023-05-08 DIAGNOSIS — E559 Vitamin D deficiency, unspecified: Secondary | ICD-10-CM | POA: Diagnosis not present

## 2023-05-08 DIAGNOSIS — N1831 Chronic kidney disease, stage 3a: Secondary | ICD-10-CM | POA: Diagnosis not present

## 2023-05-08 LAB — POCT URINALYSIS DIPSTICK
Bilirubin, UA: NEGATIVE
Blood, UA: NEGATIVE
Glucose, UA: NEGATIVE
Ketones, UA: NEGATIVE
Nitrite, UA: POSITIVE
Protein, UA: NEGATIVE
Spec Grav, UA: 1.01 (ref 1.010–1.025)
Urobilinogen, UA: 0.2 U/dL
pH, UA: 5 (ref 5.0–8.0)

## 2023-05-08 MED ORDER — CEPHALEXIN 500 MG PO CAPS
500.0000 mg | ORAL_CAPSULE | Freq: Two times a day (BID) | ORAL | 0 refills | Status: DC
Start: 2023-05-08 — End: 2023-09-04

## 2023-05-08 NOTE — Progress Notes (Signed)
 [facility] [address] [place], [place] [address]  Internal MEDICINE  Office Visit Note  Patient Name: Amanda Duke  [id number]  [id number]  Date of Service: 05/14/2023  Chief Complaint  Patient presents with   Follow-up   Hypertension   Hyperlipidemia   Gastroesophageal Reflux   Quality Metric Gaps    TDAP and Shingles Vaccines   Urinary Tract Infection    Odor, urine frequency, some burning, pelvic pain    HPI Pt is here for routine follow up -Having UTI sx for about 5 days, having burning, frequency, pelvic and low back pain. Has been drinking plenty of water. No blood in urine that she can tell. -BP has been improving, 130s/70s, tolerating med change well -feeling more cold recently, knows weather may play a role, but will check labs  Current Medication: Outpatient Encounter Medications as of 05/08/2023  Medication Sig Note   acetaminophen  (TYLENOL ) 500 MG tablet Take 1,000 mg by mouth every 6 (six) hours as needed for moderate pain.    aspirin  EC 81 MG tablet Take 81 mg by mouth daily. Swallow whole.    cephALEXin  (KEFLEX ) 500 MG capsule Take 1 capsule (500 mg total) by mouth 2 (two) times daily.    cholecalciferol  (VITAMIN D ) 1000 units tablet Take 1,000 Units by mouth daily.    clonazePAM  (KLONOPIN ) 0.5 MG tablet Take 1 tablet (0.5 mg total) by mouth as needed for anxiety. Take 1 tablet by mouth as needed    clopidogrel  (PLAVIX ) 75 MG tablet Take 1 tablet (75 mg total) by mouth daily.    diltiazem  (CARDIZEM  LA) 240 MG 24 hr tablet TAKE 1 TABLET BY MOUTH EVERY DAY    fluticasone  (FLONASE ) 50 MCG/ACT nasal spray INSTILL 1 SPRAY INTO BOTH NOSTRILS DAILY    icosapent  Ethyl (VASCEPA ) 1 g capsule Take 2 g by mouth 2 (two) times daily.    linaclotide  (LINZESS ) 290 MCG CAPS capsule Take 1 capsule (290 mcg total) by mouth daily. (Patient taking differently: Take 290 mcg by mouth daily before breakfast.)    losartan  (COZAAR ) 100 MG tablet Take 100 mg by mouth  every morning.    ondansetron  (ZOFRAN -ODT) 4 MG disintegrating tablet Take 1 tablet (4 mg total) by mouth every 8 (eight) hours as needed. (Patient taking differently: Take 4 mg by mouth every 8 (eight) hours as needed for nausea or vomiting.)    pantoprazole  (PROTONIX ) 40 MG tablet Take 40 mg by mouth as needed. 01/25/2023: Is taking every day now instead of prn   rosuvastatin  (CRESTOR ) 40 MG tablet Take 40 mg by mouth at bedtime.    senna-docusate (SENOKOT-S) 8.6-50 MG tablet Take 1 tablet by mouth at bedtime as needed for mild constipation.    solifenacin  (VESICARE ) 5 MG tablet TAKE 1 TABLET (5 MG TOTAL) BY MOUTH DAILY. (Patient taking differently: Take 5 mg by mouth as needed.)    [DISCONTINUED] diltiazem  (CARDIZEM  LA) 240 MG 24 hr tablet Take 1 tablet (240 mg total) by mouth daily.    No facility-administered encounter medications on file as of 05/08/2023.    Surgical History: Past Surgical History:  Procedure Laterality Date   APPENDECTOMY  1971   ARTHRODESIS  1980   BACK SURGERY  1980   COLONOSCOPY WITH PROPOFOL  N/A 12/12/2016   Procedure: COLONOSCOPY WITH PROPOFOL ;  Surgeon: Gaylyn Gladis PENNER, MD;  Location: [facility] ENDOSCOPY;  Service: Endoscopy;  Laterality: N/A;   COLONOSCOPY WITH PROPOFOL  N/A 07/14/2019   Procedure: COLONOSCOPY WITH PROPOFOL ;  Surgeon: [place],  Teodoro K, MD;  Location: [facility] ENDOSCOPY;  Service: Gastroenterology;  Laterality: N/A;   ESOPHAGOGASTRODUODENOSCOPY N/A 12/27/2021   Procedure: ESOPHAGOGASTRODUODENOSCOPY (EGD);  Surgeon: Tye Millet, DO;  Location: [facility] ENDOSCOPY;  Service: General;  Laterality: N/A;   ESOPHAGOGASTRODUODENOSCOPY (EGD) WITH PROPOFOL  N/A 12/12/2016   Procedure: ESOPHAGOGASTRODUODENOSCOPY (EGD) WITH PROPOFOL ;  Surgeon: Gaylyn Gladis PENNER, MD;  Location: [facility] ENDOSCOPY;  Service: Endoscopy;  Laterality: N/A;   ESOPHAGOGASTRODUODENOSCOPY (EGD) WITH PROPOFOL  N/A 07/14/2019   Procedure: ESOPHAGOGASTRODUODENOSCOPY (EGD) WITH PROPOFOL ;  Surgeon:  [place], Ladell POUR, MD;  Location: [facility] ENDOSCOPY;  Service: Gastroenterology;  Laterality: N/A;   INSERTION OF MESH  02/05/2022   Procedure: INSERTION OF MESH;  Surgeon: Jordis Laneta FALCON, MD;  Location: [facility] ORS;  Service: General;;   LAPAROSCOPIC UNILATERAL SALPINGO OOPHERECTOMY  1985   right   LEFT HEART CATH AND CORONARY ANGIOGRAPHY Left 09/25/2001   Procedure: LEFT HEART CATH AND CORONARY ANGIOGRAPHY; Location: [facility]; Surgeon: Margie Lovelace, MD   LEFT HEART CATH AND CORONARY ANGIOGRAPHY Left 06/09/2003   Procedure: LEFT HEART CATH AND CORONARY ANGIOGRAPHY; Location: [facility]; Surgeon: Margie Lovelace, MD   ORIF ANKLE FRACTURE Right    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    TONSILLECTOMY  1948   TUBAL LIGATION  1971   UMBILICAL HERNIA REPAIR N/A 02/05/2022   Procedure: HERNIA REPAIR UMBILICAL ADULT;  Surgeon: Jordis Laneta FALCON, MD;  Location: [facility] ORS;  Service: General;  Laterality: N/A;   VAGINAL HYSTERECTOMY  1985   d/t heavy bleeding   XI ROBOTIC ASSISTED PARAESOPHAGEAL HERNIA REPAIR N/A 02/05/2022   Procedure: XI ROBOTIC ASSISTED PARAESOPHAGEAL HERNIA REPAIR, RNFA to assist;  Surgeon: Jordis Laneta FALCON, MD;  Location: [facility] ORS;  Service: General;  Laterality: N/A;    Medical History: Past Medical History:  Diagnosis Date   Anxiety    Aortic atherosclerosis (HCC)    Arthritis    Bilateral renal cysts    Coronary artery disease 09/25/2001   a.) LHC 09/25/2001: EF 78%. 20% pLAD - med mgmt; b.) LHC 06/09/2003: EF 63%. 20% pLCx, 20% pLAD, 50% mLAD, 50% dLAD - med mgmt.   Diastolic dysfunction 12/30/2017   a.) Stress echo 12/30/2017: ED >55%, triv PR, mild AR.MR, mod TR, G1DD; b.) TTE 03/30/2020: EF >55%, mild LVH, mild panvalvular regurgitation.   Gastritis    GERD (gastroesophageal reflux disease)    Hepatic steatosis    History of 2019 novel coronavirus disease (COVID-19) 01/11/2022   History of hiatal hernia    a.) s/p repair 01/2022   Hyperlipemia    Hyperplastic  colon polyp    Hypertension    Hypoglycemia 05/2022   this is something new-seeing pcp for this currently   Pneumonia    PONV (postoperative nausea and vomiting)    a.) PONV following orthopedic surgery (hip) in 2022   Prediabetes    PSVT (paroxysmal supraventricular tachycardia) (HCC)    Redundant colon    Tubular adenoma of colon    Umbilical hernia     Family History: Family History  Problem Relation Age of Onset   Heart failure Mother    Stroke Mother    Breast cancer Mother        dx at age 71 yo   Diabetes Mother    Osteoporosis Mother    Emphysema Father    Ovarian cancer Sister        dx at age 39 yo   Asthma Sister    Diabetes Daughter    Heart disease Daughter  COPD Daughter     Social History   Socioeconomic History   Marital status: Widowed    Spouse name: Not on file   Number of children: Not on file   Years of education: Not on file   Highest education level: Not on file  Occupational History   Not on file  Tobacco Use   Smoking status: Former    Current packs/day: 0.00    Average packs/day: 0.5 packs/day for 5.0 years (2.5 ttl pk-yrs)    Types: Cigarettes    Start date: 66    Quit date: 1    Years since quitting: 45.0    Passive exposure: Past   Smokeless tobacco: Never  Vaping Use   Vaping status: Never Used  Substance and Sexual Activity   Alcohol use: No    Alcohol/week: 0.0 standard drinks of alcohol   Drug use: Never   Sexual activity: Not Currently  Other Topics Concern   Not on file  Social History Narrative   Not on file   Social Drivers of Health   Financial Resource Strain: Low Risk  (11/17/2020)   Overall Financial Resource Strain (CARDIA)    Difficulty of Paying Living Expenses: Not very hard  Food Insecurity: No Food Insecurity (01/25/2023)   Hunger Vital Sign    Worried About Running Out of Food in the Last Year: Never true    Ran Out of Food in the Last Year: Never true  Transportation Needs: No Transportation  Needs (01/25/2023)   PRAPARE - Administrator, Civil Service (Medical): No    Lack of Transportation (Non-Medical): No  Physical Activity: Inactive (01/10/2019)   Exercise Vital Sign    Days of Exercise per Week: 0 days    Minutes of Exercise per Session: 0 min  Stress: No Stress Concern Present (01/10/2019)   [employer] of Occupational Health - Occupational Stress Questionnaire    Feeling of Stress : Not at all  Social Connections: Socially Isolated (01/10/2019)   Social Connection and Isolation Panel [NHANES]    Frequency of Communication with Friends and Family: Three times a week    Frequency of Social Gatherings with Friends and Family: Three times a week    Attends Religious Services: Never    Active Member of Clubs or Organizations: No    Attends Banker Meetings: Never    Marital Status: Widowed  Intimate Partner Violence: Unknown (01/25/2023)   Humiliation, Afraid, Rape, and Kick questionnaire    Fear of Current or Ex-Partner: No    Emotionally Abused: No    Physically Abused: Not on file    Sexually Abused: No      Review of Systems  Constitutional:  Negative for chills, fatigue and unexpected weight change.  HENT:  Negative for congestion, dental problem, postnasal drip, rhinorrhea, sneezing and sore throat.   Eyes:  Negative for redness.  Respiratory: Negative.  Negative for cough, chest tightness, shortness of breath and wheezing.   Cardiovascular: Negative.  Negative for chest pain and palpitations.  Gastrointestinal:  Negative for abdominal pain, constipation, diarrhea, nausea and vomiting.  Endocrine: Positive for cold intolerance.  Genitourinary:  Positive for dysuria, flank pain, frequency and pelvic pain. Negative for hematuria and urgency.  Musculoskeletal:  Negative for arthralgias, joint swelling and neck pain.  Skin:  Negative for rash.  Neurological: Negative.  Negative for tremors and numbness.  Hematological:  Negative for  adenopathy. Does not bruise/bleed easily.  Psychiatric/Behavioral:  Negative for behavioral problems (Depression),  sleep disturbance and suicidal ideas.     Vital Signs: BP 120/79   Pulse 74   Temp 97.8 F (36.6 C)   Resp 16   Ht 5' 4 (1.626 m)   Wt 160 lb 12.8 oz (72.9 kg)   SpO2 96%   BMI 27.60 kg/m    Physical Exam Vitals and nursing note reviewed.  Constitutional:      Appearance: Normal appearance.  HENT:     Head: Normocephalic and atraumatic.  Eyes:     Pupils: Pupils are equal, round, and reactive to light.  Cardiovascular:     Rate and Rhythm: Normal rate and regular rhythm.  Pulmonary:     Effort: Pulmonary effort is normal.     Breath sounds: Normal breath sounds.  Skin:    General: Skin is warm and dry.  Neurological:     Mental Status: She is alert.  Psychiatric:        Mood and Affect: Mood normal.        Behavior: Behavior normal.        Assessment/Plan: 1. Benign essential HTN (Primary) Improved, continue current medications  2. Urinary tract infection without hematuria, site unspecified Will start on keflex  and adjust based on C/S - CULTURE, URINE COMPREHENSIVE  3. Dysuria - POCT Urinalysis Dipstick - cephALEXin  (KEFLEX ) 500 MG capsule; Take 1 capsule (500 mg total) by mouth 2 (two) times daily.  Dispense: 20 capsule; Refill: 0  4. Prediabetes Will check labs - Hgb A1C w/o eAG  5. Mixed hyperlipidemia Continue current medications and will update labs - Lipid Panel With LDL/HDL Ratio  6. Stage 3a chronic kidney disease (HCC) - Comprehensive metabolic panel  7. Thyroid  disorder screening - TSH + free T4  8. Cold feeling Will check thyroid  as well as Iron/B12 - TSH + free T4 - Fe+TIBC+Fer  9. Vitamin D  deficiency - VITAMIN D  25 Hydroxy (Vit-D Deficiency, Fractures)  10. B12 deficiency - B12 and Folate Panel  11. Other fatigue - CBC w/Diff/Platelet - Fe+TIBC+Fer   General Counseling: Frankye verbalizes understanding of  the findings of todays visit and agrees with plan of treatment. I have discussed any further diagnostic evaluation that may be needed or ordered today. We also reviewed her medications today. she has been encouraged to call the office with any questions or concerns that should arise related to todays visit.    Orders Placed This Encounter  Procedures   CULTURE, URINE COMPREHENSIVE   CBC w/Diff/Platelet   Comprehensive metabolic panel   TSH + free T4   Fe+TIBC+Fer   Lipid Panel With LDL/HDL Ratio   VITAMIN D  25 Hydroxy (Vit-D Deficiency, Fractures)   Hgb A1C w/o eAG   B12 and Folate Panel   POCT Urinalysis Dipstick    Meds ordered this encounter  Medications   cephALEXin  (KEFLEX ) 500 MG capsule    Sig: Take 1 capsule (500 mg total) by mouth 2 (two) times daily.    Dispense:  20 capsule    Refill:  0    This patient was seen by Tinnie Pro, PA-C in collaboration with Dr. Sigrid Bathe as a part of collaborative care agreement.   Total time spent:30 Minutes Time spent includes review of chart, medications, test results, and follow up plan with the patient.      Dr Fozia M Khan Internal medicine

## 2023-05-13 ENCOUNTER — Other Ambulatory Visit
Admission: RE | Admit: 2023-05-13 | Discharge: 2023-05-13 | Disposition: A | Discharge status: Home / Self Care | Payer: 59 | Attending: Physician Assistant | Admitting: Physician Assistant

## 2023-05-13 DIAGNOSIS — E538 Deficiency of other specified B group vitamins: Secondary | ICD-10-CM | POA: Diagnosis not present

## 2023-05-13 DIAGNOSIS — R7303 Prediabetes: Secondary | ICD-10-CM | POA: Diagnosis not present

## 2023-05-13 DIAGNOSIS — E782 Mixed hyperlipidemia: Secondary | ICD-10-CM | POA: Diagnosis not present

## 2023-05-13 DIAGNOSIS — N1831 Chronic kidney disease, stage 3a: Secondary | ICD-10-CM | POA: Insufficient documentation

## 2023-05-13 DIAGNOSIS — E559 Vitamin D deficiency, unspecified: Secondary | ICD-10-CM | POA: Insufficient documentation

## 2023-05-13 DIAGNOSIS — Z1329 Encounter for screening for other suspected endocrine disorder: Secondary | ICD-10-CM | POA: Diagnosis not present

## 2023-05-13 DIAGNOSIS — R6889 Other general symptoms and signs: Secondary | ICD-10-CM | POA: Diagnosis not present

## 2023-05-13 DIAGNOSIS — R5383 Other fatigue: Secondary | ICD-10-CM | POA: Insufficient documentation

## 2023-05-13 LAB — COMPREHENSIVE METABOLIC PANEL
ALT: 12 U/L (ref 0–44)
AST: 19 U/L (ref 15–41)
Albumin: 4.1 g/dL (ref 3.5–5.0)
Alkaline Phosphatase: 88 U/L (ref 38–126)
Anion gap: 11 (ref 5–15)
BUN: 12 mg/dL (ref 8–23)
CO2: 26 mmol/L (ref 22–32)
Calcium: 9.3 mg/dL (ref 8.9–10.3)
Chloride: 101 mmol/L (ref 98–111)
Creatinine, Ser: 0.94 mg/dL (ref 0.44–1.00)
GFR, Estimated: >60 mL/min (ref >60)
Glucose, Bld: 101 mg/dL — ABNORMAL HIGH (ref 70–99)
Potassium: 3.9 mmol/L (ref 3.5–5.1)
Sodium: 138 mmol/L (ref 135–145)
Total Bilirubin: 0.7 mg/dL (ref 0.0–1.2)
Total Protein: 7.6 g/dL (ref 6.5–8.1)

## 2023-05-13 LAB — IRON AND TIBC
Iron: 58 ug/dL (ref 28–170)
Saturation Ratios: 12 % (ref 10.4–31.8)
TIBC: 466 ug/dL — ABNORMAL HIGH (ref 250–450)
UIBC: 408 ug/dL

## 2023-05-13 LAB — LIPID PANEL
Cholesterol: 132 mg/dL (ref 0–200)
HDL: 67 mg/dL (ref >40)
LDL Cholesterol: 47 mg/dL (ref 0–99)
Total CHOL/HDL Ratio: 2 {ratio}
Triglycerides: 91 mg/dL (ref <150)
VLDL: 18 mg/dL (ref 0–40)

## 2023-05-13 LAB — CBC WITH DIFFERENTIAL/PLATELET
Abs Immature Granulocytes: 0.02 10*3/uL (ref 0.00–0.07)
Basophils Absolute: 0.1 10*3/uL (ref 0.0–0.1)
Basophils Relative: 1 %
Eosinophils Absolute: 0.2 10*3/uL (ref 0.0–0.5)
Eosinophils Relative: 3 %
HCT: 44.1 % (ref 36.0–46.0)
Hemoglobin: 14.3 g/dL (ref 12.0–15.0)
Immature Granulocytes: 0 %
Lymphocytes Relative: 22 %
Lymphs Abs: 1.8 10*3/uL (ref 0.7–4.0)
MCH: 29.4 pg (ref 26.0–34.0)
MCHC: 32.4 g/dL (ref 30.0–36.0)
MCV: 90.6 fL (ref 80.0–100.0)
Monocytes Absolute: 0.6 10*3/uL (ref 0.1–1.0)
Monocytes Relative: 8 %
Neutro Abs: 5.2 10*3/uL (ref 1.7–7.7)
Neutrophils Relative %: 66 %
Platelets: 273 10*3/uL (ref 150–400)
RBC: 4.87 MIL/uL (ref 3.87–5.11)
RDW: 14.6 % (ref 11.5–15.5)
WBC: 7.9 10*3/uL (ref 4.0–10.5)
nRBC: 0 % (ref 0.0–0.2)

## 2023-05-13 LAB — FOLATE: Folate: 28 ng/mL (ref >5.9)

## 2023-05-13 LAB — VITAMIN B12: Vitamin B-12: 156 pg/mL — ABNORMAL LOW (ref 180–914)

## 2023-05-13 LAB — VITAMIN D 25 HYDROXY (VIT D DEFICIENCY, FRACTURES): Vit D, 25-Hydroxy: 22.51 ng/mL — ABNORMAL LOW (ref 30–100)

## 2023-05-13 LAB — TSH: TSH: 1.62 u[IU]/mL (ref 0.350–4.500)

## 2023-05-13 LAB — HEMOGLOBIN A1C
Hgb A1c MFr Bld: 5.6 % (ref 4.8–5.6)
Mean Plasma Glucose: 114.02 mg/dL

## 2023-05-13 LAB — FERRITIN: Ferritin: 29 ng/mL (ref 11–307)

## 2023-05-13 LAB — T4, FREE: Free T4: 0.76 ng/dL (ref 0.61–1.12)

## 2023-05-14 ENCOUNTER — Telehealth: Payer: Self-pay

## 2023-05-14 NOTE — Telephone Encounter (Signed)
 Spoke with patient regarding labs, patient advised to start B12 injections, patient says she will consider it and let us  know.

## 2023-05-14 NOTE — Telephone Encounter (Signed)
-----   Message from Jacques Mattock sent at 05/13/2023  4:16 PM EST ----- Please let her know that her B12 is very low and I recommend B12 injections. Vit D also low and can do OTC supplement. Iron saturation borderline low and may consider a little iron supplement/increasing in diet. A1c improved to normal range at 5.6 now. Cholesterol greatly improved.

## 2023-05-15 LAB — CULTURE, URINE COMPREHENSIVE

## 2023-05-16 ENCOUNTER — Telehealth: Payer: Self-pay

## 2023-05-16 NOTE — Telephone Encounter (Signed)
Spoke with patient regarding urine culture results and scheduled patient's first B12 injection.

## 2023-05-16 NOTE — Telephone Encounter (Signed)
-----   Message from Carlean Jews sent at 05/15/2023  4:28 PM EST ----- Please let her know she does have UTI and should be ok with her current ABX

## 2023-05-20 ENCOUNTER — Ambulatory Visit (INDEPENDENT_AMBULATORY_CARE_PROVIDER_SITE_OTHER): Payer: 59

## 2023-05-20 DIAGNOSIS — E538 Deficiency of other specified B group vitamins: Secondary | ICD-10-CM | POA: Diagnosis not present

## 2023-05-20 MED ORDER — CYANOCOBALAMIN 1000 MCG/ML IJ SOLN
1000.0000 ug | Freq: Once | INTRAMUSCULAR | Status: AC
  Administered 2023-05-20: 1000 ug via INTRAMUSCULAR

## 2023-05-27 ENCOUNTER — Ambulatory Visit (INDEPENDENT_AMBULATORY_CARE_PROVIDER_SITE_OTHER): Payer: 59

## 2023-05-27 DIAGNOSIS — E538 Deficiency of other specified B group vitamins: Secondary | ICD-10-CM

## 2023-05-27 MED ORDER — CYANOCOBALAMIN 1000 MCG/ML IJ SOLN
1000.0000 ug | Freq: Once | INTRAMUSCULAR | Status: AC
  Administered 2023-05-27: 1000 ug via INTRAMUSCULAR

## 2023-06-03 ENCOUNTER — Ambulatory Visit (INDEPENDENT_AMBULATORY_CARE_PROVIDER_SITE_OTHER): Payer: 59

## 2023-06-03 DIAGNOSIS — E538 Deficiency of other specified B group vitamins: Secondary | ICD-10-CM | POA: Diagnosis not present

## 2023-06-03 MED ORDER — CYANOCOBALAMIN 1000 MCG/ML IJ SOLN
1000.0000 ug | Freq: Once | INTRAMUSCULAR | Status: AC
  Administered 2023-06-03: 1000 ug via INTRAMUSCULAR

## 2023-06-04 DIAGNOSIS — G459 Transient cerebral ischemic attack, unspecified: Secondary | ICD-10-CM | POA: Diagnosis not present

## 2023-06-04 DIAGNOSIS — I771 Stricture of artery: Secondary | ICD-10-CM | POA: Diagnosis not present

## 2023-06-04 DIAGNOSIS — R531 Weakness: Secondary | ICD-10-CM | POA: Diagnosis not present

## 2023-07-01 ENCOUNTER — Ambulatory Visit (INDEPENDENT_AMBULATORY_CARE_PROVIDER_SITE_OTHER): Payer: 59

## 2023-07-01 ENCOUNTER — Other Ambulatory Visit: Payer: Self-pay | Admitting: Physician Assistant

## 2023-07-01 VITALS — Resp 16 | Ht 64.0 in

## 2023-07-01 DIAGNOSIS — E538 Deficiency of other specified B group vitamins: Secondary | ICD-10-CM

## 2023-07-01 MED ORDER — CYANOCOBALAMIN 1000 MCG/ML IJ SOLN
1000.0000 ug | Freq: Once | INTRAMUSCULAR | Status: AC
  Administered 2023-07-01: 1000 ug via INTRAMUSCULAR

## 2023-07-08 ENCOUNTER — Ambulatory Visit (INDEPENDENT_AMBULATORY_CARE_PROVIDER_SITE_OTHER): Payer: 59

## 2023-07-08 ENCOUNTER — Encounter (INDEPENDENT_AMBULATORY_CARE_PROVIDER_SITE_OTHER): Payer: Self-pay | Admitting: Vascular Surgery

## 2023-07-08 ENCOUNTER — Ambulatory Visit (INDEPENDENT_AMBULATORY_CARE_PROVIDER_SITE_OTHER): Payer: 59 | Admitting: Vascular Surgery

## 2023-07-08 VITALS — BP 169/87 | HR 74 | Resp 16 | Wt 159.2 lb

## 2023-07-08 DIAGNOSIS — I6523 Occlusion and stenosis of bilateral carotid arteries: Secondary | ICD-10-CM | POA: Diagnosis not present

## 2023-07-08 DIAGNOSIS — I10 Essential (primary) hypertension: Secondary | ICD-10-CM | POA: Diagnosis not present

## 2023-07-08 DIAGNOSIS — I771 Stricture of artery: Secondary | ICD-10-CM

## 2023-07-08 DIAGNOSIS — E785 Hyperlipidemia, unspecified: Secondary | ICD-10-CM

## 2023-07-08 NOTE — Assessment & Plan Note (Signed)
 blood pressure control important in reducing the progression of atherosclerotic disease. On appropriate oral medications.

## 2023-07-08 NOTE — Progress Notes (Unsigned)
 MRN : [id number]  Amanda Duke is a 83 y.o. (Dec 21, 1940) female who presents with chief complaint of No chief complaint on file. Marland Kitchen  History of Present Illness: Patient returns today in follow up of ***  Current Outpatient Medications  Medication Sig Dispense Refill   acetaminophen (TYLENOL) 500 MG tablet Take 1,000 mg by mouth every 6 (six) hours as needed for moderate pain.     aspirin EC 81 MG tablet Take 81 mg by mouth daily. Swallow whole.     cephALEXin (KEFLEX) 500 MG capsule Take 1 capsule (500 mg total) by mouth 2 (two) times daily. 20 capsule 0   cholecalciferol (VITAMIN D) 1000 units tablet Take 1,000 Units by mouth daily.     clonazePAM (KLONOPIN) 0.5 MG tablet Take 1 tablet (0.5 mg total) by mouth as needed for anxiety. Take 1 tablet by mouth as needed 30 tablet 0   clopidogrel (PLAVIX) 75 MG tablet Take 1 tablet (75 mg total) by mouth daily. 21 tablet 0   diltiazem (CARDIZEM LA) 240 MG 24 hr tablet TAKE 1 TABLET BY MOUTH EVERY DAY 30 tablet 2   fluticasone (FLONASE) 50 MCG/ACT nasal spray SPRAY 1 SPRAY INTO EACH NOSTRIL DAILY 16 mL 2   icosapent Ethyl (VASCEPA) 1 g capsule Take 2 g by mouth 2 (two) times daily.     linaclotide (LINZESS) 290 MCG CAPS capsule Take 1 capsule (290 mcg total) by mouth daily. (Patient taking differently: Take 290 mcg by mouth daily before breakfast.) 30 capsule 3   losartan (COZAAR) 100 MG tablet Take 100 mg by mouth every morning.     ondansetron (ZOFRAN-ODT) 4 MG disintegrating tablet Take 1 tablet (4 mg total) by mouth every 8 (eight) hours as needed. (Patient taking differently: Take 4 mg by mouth every 8 (eight) hours as needed for nausea or vomiting.) 20 tablet 0   pantoprazole (PROTONIX) 40 MG tablet Take 40 mg by mouth as needed.     rosuvastatin (CRESTOR) 40 MG tablet Take 40 mg by mouth at bedtime.     senna-docusate (SENOKOT-S) 8.6-50 MG tablet Take 1 tablet by mouth at bedtime as needed for mild constipation. 20 tablet 0    solifenacin (VESICARE) 5 MG tablet TAKE 1 TABLET (5 MG TOTAL) BY MOUTH DAILY. (Patient taking differently: Take 5 mg by mouth as needed.) 90 tablet 1   No current facility-administered medications for this visit.    Past Medical History:  Diagnosis Date   Anxiety    Aortic atherosclerosis (HCC)    Arthritis    Bilateral renal cysts    Coronary artery disease 09/25/2001   a.) LHC 09/25/2001: EF 78%. 20% pLAD - med mgmt; b.) LHC 06/09/2003: EF 63%. 20% pLCx, 20% pLAD, 50% mLAD, 50% dLAD - med mgmt.   Diastolic dysfunction 12/30/2017   a.) Stress echo 12/30/2017: ED >55%, triv PR, mild AR.MR, mod TR, G1DD; b.) TTE 03/30/2020: EF >55%, mild LVH, mild panvalvular regurgitation.   Gastritis    GERD (gastroesophageal reflux disease)    Hepatic steatosis    History of 2019 novel coronavirus disease (COVID-19) 01/11/2022   History of hiatal hernia    a.) s/p repair 01/2022   Hyperlipemia    Hyperplastic colon polyp    Hypertension    Hypoglycemia 05/2022   this is something new-seeing pcp for this currently   Pneumonia    PONV (postoperative nausea and vomiting)    a.) PONV following orthopedic surgery (hip) in 2022   Prediabetes  PSVT (paroxysmal supraventricular tachycardia) (HCC)    Redundant colon    Tubular adenoma of colon    Umbilical hernia     Past Surgical History:  Procedure Laterality Date   APPENDECTOMY  1971   ARTHRODESIS  1980   BACK SURGERY  1980   COLONOSCOPY WITH PROPOFOL N/A 12/12/2016   Procedure: COLONOSCOPY WITH PROPOFOL;  Surgeon: Christena Deem, MD;  Location: [facility] ENDOSCOPY;  Service: Endoscopy;  Laterality: N/A;   COLONOSCOPY WITH PROPOFOL N/A 07/14/2019   Procedure: COLONOSCOPY WITH PROPOFOL;  Surgeon: [place], Boykin Nearing, MD;  Location: [facility] ENDOSCOPY;  Service: Gastroenterology;  Laterality: N/A;   ESOPHAGOGASTRODUODENOSCOPY N/A 12/27/2021   Procedure: ESOPHAGOGASTRODUODENOSCOPY (EGD);  Surgeon: Sung Amabile, DO;  Location: [facility] ENDOSCOPY;   Service: General;  Laterality: N/A;   ESOPHAGOGASTRODUODENOSCOPY (EGD) WITH PROPOFOL N/A 12/12/2016   Procedure: ESOPHAGOGASTRODUODENOSCOPY (EGD) WITH PROPOFOL;  Surgeon: Christena Deem, MD;  Location: [facility] ENDOSCOPY;  Service: Endoscopy;  Laterality: N/A;   ESOPHAGOGASTRODUODENOSCOPY (EGD) WITH PROPOFOL N/A 07/14/2019   Procedure: ESOPHAGOGASTRODUODENOSCOPY (EGD) WITH PROPOFOL;  Surgeon: [place], Boykin Nearing, MD;  Location: [facility] ENDOSCOPY;  Service: Gastroenterology;  Laterality: N/A;   INSERTION OF MESH  02/05/2022   Procedure: INSERTION OF MESH;  Surgeon: Leafy Ro, MD;  Location: [facility] ORS;  Service: General;;   LAPAROSCOPIC UNILATERAL SALPINGO OOPHERECTOMY  1985   right   LEFT HEART CATH AND CORONARY ANGIOGRAPHY Left 09/25/2001   Procedure: LEFT HEART CATH AND CORONARY ANGIOGRAPHY; Location: [facility]; Surgeon: Rudean Hitt, MD   LEFT HEART CATH AND CORONARY ANGIOGRAPHY Left 06/09/2003   Procedure: LEFT HEART CATH AND CORONARY ANGIOGRAPHY; Location: [facility]; Surgeon: Rudean Hitt, MD   ORIF ANKLE FRACTURE Right    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    TONSILLECTOMY  1948   TUBAL LIGATION  1971   UMBILICAL HERNIA REPAIR N/A 02/05/2022   Procedure: HERNIA REPAIR UMBILICAL ADULT;  Surgeon: Leafy Ro, MD;  Location: [facility] ORS;  Service: General;  Laterality: N/A;   VAGINAL HYSTERECTOMY  1985   d/t heavy bleeding   XI ROBOTIC ASSISTED PARAESOPHAGEAL HERNIA REPAIR N/A 02/05/2022   Procedure: XI ROBOTIC ASSISTED PARAESOPHAGEAL HERNIA REPAIR, RNFA to assist;  Surgeon: Leafy Ro, MD;  Location: [facility] ORS;  Service: General;  Laterality: N/A;     Social History   Tobacco Use   Smoking status: Former    Current packs/day: 0.00    Average packs/day: 0.5 packs/day for 5.0 years (2.5 ttl pk-yrs)    Types: Cigarettes    Start date: 64    Quit date: 1980    Years since quitting: 45.2    Passive exposure: Past   Smokeless tobacco: Never  Vaping Use   Vaping  status: Never Used  Substance Use Topics   Alcohol use: No    Alcohol/week: 0.0 standard drinks of alcohol   Drug use: Never      Family History  Problem Relation Age of Onset   Heart failure Mother    Stroke Mother    Breast cancer Mother        dx at age 62 yo   Diabetes Mother    Osteoporosis Mother    Emphysema Father    Ovarian cancer Sister        dx at age 38 yo   Asthma Sister    Diabetes Daughter    Heart disease Daughter    COPD Daughter      Allergies  Allergen Reactions   Amoxicillin Hives  Patient tolerated Ceftriaxone in ED on 01/10/19.    Clindamycin Diarrhea   Gemfibrozil Other (See Comments)    muscle ache   Levofloxacin Other (See Comments)    Muscle ache for several months   Metoprolol Other (See Comments)    "Lowers blood pressure extremely low"   Simvastatin Other (See Comments)    muscle ache   Macrobid [Nitrofurantoin Macrocrystal] Diarrhea   Niacin Rash   Nystatin Rash   Nystatin-Triamcinolone Rash   Sulfa Antibiotics Rash    Family history of medication allergy    REVIEW OF SYSTEMS (Negative unless checked)   Constitutional: [] Weight loss  [] Fever  [] Chills Cardiac: [] Chest pain   [] Chest pressure   [] Palpitations   [] Shortness of breath when laying flat   [] Shortness of breath at rest   [] Shortness of breath with exertion. Vascular:  [] Pain in legs with walking   [] Pain in legs at rest   [] Pain in legs when laying flat   [] Claudication   [] Pain in feet when walking  [] Pain in feet at rest  [] Pain in feet when laying flat   [] History of DVT   [] Phlebitis   [] Swelling in legs   [] Varicose veins   [] Non-healing ulcers Pulmonary:   [] Uses home oxygen   [] Productive cough   [] Hemoptysis   [] Wheeze  [] COPD   [] Asthma Neurologic:  [] Dizziness  [] Blackouts   [] Seizures   [] History of stroke   [x] History of TIA  [] Aphasia   [] Temporary blindness   [] Dysphagia   [] Weakness or numbness in arms   [] Weakness or numbness in legs Musculoskeletal:   [x] Arthritis   [] Joint swelling   [x] Joint pain   [] Low back pain Hematologic:  [] Easy bruising  [] Easy bleeding   [] Hypercoagulable state   [] Anemic  [] Hepatitis Gastrointestinal:  [] Blood in stool   [] Vomiting blood  [x] Gastroesophageal reflux/heartburn   [] Abdominal pain Genitourinary:  [] Chronic kidney disease   [] Difficult urination  [] Frequent urination  [] Burning with urination   [] Hematuria Skin:  [] Rashes   [] Ulcers   [] Wounds Psychological:  [x] History of anxiety   []  History of major depression.  Physical Examination  There were no vitals taken for this visit. Gen:  WD/WN, NAD Head: Ray/AT, No temporalis wasting. Ear/Nose/Throat: Hearing grossly intact, nares w/o erythema or drainage Eyes: Conjunctiva clear. Sclera non-icteric Neck: Supple.  Trachea midline Pulmonary:  Good air movement, no use of accessory muscles.  Cardiac: RRR, no JVD Vascular:  Vessel Right Left  Radial Palpable Palpable                          PT Palpable Palpable  DP Palpable Palpable   Gastrointestinal: soft, non-tender/non-distended. No guarding/reflex.  Musculoskeletal: M/S 5/5 throughout.  No deformity or atrophy. *** edema. Neurologic: Sensation grossly intact in extremities.  Symmetrical.  Speech is fluent.  Psychiatric: Judgment intact, Mood & affect appropriate for pt's clinical situation. Dermatologic: No rashes or ulcers noted.  No cellulitis or open wounds.      Labs Recent Results (from the past 2160 hours)  POCT Urinalysis Dipstick     Status: Abnormal   Collection Time: 05/08/23  3:35 PM  Result Value Ref Range   Color, UA     Clarity, UA     Glucose, UA Negative Negative   Bilirubin, UA Negative    Ketones, UA Negative    Spec Grav, UA 1.010 1.010 - 1.025   Blood, UA Negative    pH, UA 5.0 5.0 - 8.0  Protein, UA Negative Negative   Urobilinogen, UA 0.2 0.2 or 1.0 E.U./dL   Nitrite, UA Positive    Leukocytes, UA Large (3+) (A) Negative   Appearance     Odor     CULTURE, URINE COMPREHENSIVE     Status: Abnormal   Collection Time: 05/08/23  3:39 PM   Specimen: Urine   Urine  Result Value Ref Range   Urine Culture, Comprehensive Final report (A)    Organism ID, Bacteria Escherichia coli (A)     Comment: Greater than 100,000 colony forming units per mL   ANTIMICROBIAL SUSCEPTIBILITY Comment     Comment:       ** S = Susceptible; I = Intermediate; R = Resistant **                    P = Positive; N = Negative             MICS are expressed in micrograms per mL    Antibiotic                 RSLT#1    RSLT#2    RSLT#3    RSLT#4 Amoxicillin/Clavulanic Acid    S Ampicillin                     I Cefepime                       S Ceftriaxone                    S Cefuroxime                     I Ciprofloxacin                  S Ertapenem                      S Gentamicin                     S Imipenem                       S Levofloxacin                   S Meropenem                      S Nitrofurantoin                 S Piperacillin/Tazobactam        S Tetracycline                   I Tobramycin                     S Trimethoprim/Sulfa             S   CBC with Differential/Platelet     Status: None   Collection Time: 05/13/23 10:00 AM  Result Value Ref Range   WBC 7.9 4.0 - 10.5 K/uL   RBC 4.87 3.87 - 5.11 MIL/uL   Hemoglobin 14.3 12.0 - 15.0 g/dL   HCT 54.0 98.1 - 19.1 %   MCV 90.6 80.0 - 100.0 fL   MCH 29.4 26.0 - 34.0 pg   MCHC 32.4 30.0 - 36.0 g/dL   RDW 47.8 29.5 - 62.1 %  Platelets 273 150 - 400 K/uL   nRBC 0.0 0.0 - 0.2 %   Neutrophils Relative % 66 %   Neutro Abs 5.2 1.7 - 7.7 K/uL   Lymphocytes Relative 22 %   Lymphs Abs 1.8 0.7 - 4.0 K/uL   Monocytes Relative 8 %   Monocytes Absolute 0.6 0.1 - 1.0 K/uL   Eosinophils Relative 3 %   Eosinophils Absolute 0.2 0.0 - 0.5 K/uL   Basophils Relative 1 %   Basophils Absolute 0.1 0.0 - 0.1 K/uL   Immature Granulocytes 0 %   Abs Immature Granulocytes 0.02 0.00 - 0.07 K/uL     Comment: Performed at [facility], [address] [place], [place] [address]  Comprehensive metabolic panel     Status: Abnormal   Collection Time: 05/13/23 10:00 AM  Result Value Ref Range   Sodium 138 135 - 145 mmol/L   Potassium 3.9 3.5 - 5.1 mmol/L   Chloride 101 98 - 111 mmol/L   CO2 26 22 - 32 mmol/L   Glucose, Bld 101 (H) 70 - 99 mg/dL    Comment: Glucose reference range applies only to samples taken after fasting for at least 8 hours.   BUN 12 8 - 23 mg/dL   Creatinine, Ser 2.13 0.44 - 1.00 mg/dL   Calcium 9.3 8.9 - 08.6 mg/dL   Total Protein 7.6 6.5 - 8.1 g/dL   Albumin 4.1 3.5 - 5.0 g/dL   AST 19 15 - 41 U/L   ALT 12 0 - 44 U/L   Alkaline Phosphatase 88 38 - 126 U/L   Total Bilirubin 0.7 0.0 - 1.2 mg/dL   GFR, Estimated >57 >84 mL/min    Comment: (NOTE) Calculated using the CKD-EPI Creatinine Equation (2021)    Anion gap 11 5 - 15    Comment: Performed at [facility], [address] [place], [place] [address]  TSH     Status: None   Collection Time: 05/13/23 10:00 AM  Result Value Ref Range   TSH 1.620 0.350 - 4.500 uIU/mL    Comment: Performed by a 3rd Generation assay with a functional sensitivity of <=0.01 uIU/mL. Performed at [facility], [address] [place], [place] [address]   T4, free     Status: None   Collection Time: 05/13/23 10:00 AM  Result Value Ref Range   Free T4 0.76 0.61 - 1.12 ng/dL    Comment: (NOTE) Biotin ingestion may interfere with free T4 tests. If the results are inconsistent with the TSH level, previous test results, or the clinical presentation, then consider biotin interference. If needed, order repeat testing after stopping biotin. Performed at [facility], [address] [place], [place] [address]   Iron and TIBC     Status: Abnormal   Collection Time: 05/13/23 10:00 AM  Result Value Ref Range   Iron 58 28 - 170 ug/dL   TIBC 102 (H) 725 - 366 ug/dL   Saturation Ratios 12 10.4 -  31.8 %   UIBC 408 ug/dL    Comment: Performed at [facility], [address] [place], [place] [address]  Ferritin     Status: None   Collection Time: 05/13/23 10:00 AM  Result Value Ref Range   Ferritin 29 11 - 307 ng/mL    Comment: Performed at [facility], [address], [place], [place] [address]  Lipid panel     Status: None   Collection Time: 05/13/23 10:00 AM  Result  Value Ref Range   Cholesterol 132 0 - 200 mg/dL   Triglycerides 91 <161 mg/dL   HDL 67 >09 mg/dL   Total CHOL/HDL Ratio 2.0 RATIO   VLDL 18 0 - 40 mg/dL   LDL Cholesterol 47 0 - 99 mg/dL    Comment:        Total Cholesterol/HDL:CHD Risk Coronary Heart Disease Risk Table                     Men   Women  1/2 Average Risk   3.4   3.3  Average Risk       5.0   4.4  2 X Average Risk   9.6   7.1  3 X Average Risk  23.4   11.0        Use the calculated Patient Ratio above and the CHD Risk Table to determine the patient's CHD Risk.        ATP III CLASSIFICATION (LDL):  <100     mg/dL   Optimal  604-540  mg/dL   Near or Above                    Optimal  130-159  mg/dL   Borderline  981-191  mg/dL   High  >478     mg/dL   Very High Performed at [facility], [address] [place], [place] [address]   VITAMIN D 25 Hydroxy (Vit-D Deficiency, Fractures)     Status: Abnormal   Collection Time: 05/13/23 10:00 AM  Result Value Ref Range   Vit D, 25-Hydroxy 22.51 (L) 30 - 100 ng/mL    Comment: (NOTE) Vitamin D deficiency has been defined by the Institute of Medicine  and an Endocrine Society practice guideline as a level of serum 25-OH  vitamin D less than 20 ng/mL (1,2). The Endocrine Society went on to  further define vitamin D insufficiency as a level between 21 and 29  ng/mL (2).  1. IOM (Institute of Medicine). 2010. Dietary reference intakes for  calcium and D. [place]: [employer]. 2. Holick MF, Binkley Kilmarnock, Bischoff-Ferrari HA, et al. Evaluation,   treatment, and prevention of vitamin D deficiency: an Endocrine  Society clinical practice guideline, JCEM. 2011 Jul; 96(7): 1911-30.  Performed at [facility], [address], [place], [place] [address]   Hemoglobin A1c     Status: None   Collection Time: 05/13/23 10:00 AM  Result Value Ref Range   Hgb A1c MFr Bld 5.6 4.8 - 5.6 %    Comment: (NOTE) Pre diabetes:          5.7%-6.4%  Diabetes:              >6.4%  Glycemic control for   <7.0% adults with diabetes    Mean Plasma Glucose 114.02 mg/dL    Comment: Performed at [facility], [address], [place], [place] [address]  Vitamin B12     Status: Abnormal   Collection Time: 05/13/23 10:00 AM  Result Value Ref Range   Vitamin B-12 156 (L) 180 - 914 pg/mL    Comment: (NOTE) This assay is not validated for testing neonatal or myeloproliferative syndrome specimens for Vitamin B12 levels. Performed at [facility], [address], [place], [place] [address]   Folate     Status: None   Collection Time: 05/13/23 10:00 AM  Result Value Ref Range   Folate 28.0 >5.9  ng/mL    Comment: Performed at [facility], [address], [place], [place] [address]    Radiology No results found.  Assessment/Plan  No problem-specific Assessment & Plan notes found for this encounter.    Festus Barren, MD  07/08/2023 1:59 PM    This note was created with Dragon medical transcription system.  Any errors from dictation are purely unintentional

## 2023-07-08 NOTE — Assessment & Plan Note (Signed)
 lipid control important in reducing the progression of atherosclerotic disease. Continue statin therapy

## 2023-07-10 NOTE — Assessment & Plan Note (Signed)
 Carotid duplex today shows stable, mild, 1 to 39% ICA stenosis bilaterally without significant progression from previous studies.  Both vertebral arteries are antegrade.  There are velocities in the right subclavian artery consistent with some degree of stenosis although this does not appear to be high-grade.  The left subclavian artery appears normal.  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  No role for intervention.  Recheck in 1 year.

## 2023-07-10 NOTE — Assessment & Plan Note (Signed)
 Carotid duplex today shows stable, mild, 1 to 39% ICA stenosis bilaterally without significant progression from previous studies.  Both vertebral arteries are antegrade.  There are velocities in the right subclavian artery consistent with some degree of stenosis although this does not appear to be high-grade.  The left subclavian artery appears normal.  No role for intervention.  Continue current medical regimen.  Recheck in 1 year.

## 2023-07-18 ENCOUNTER — Other Ambulatory Visit: Payer: Self-pay | Admitting: Nurse Practitioner

## 2023-07-18 DIAGNOSIS — F411 Generalized anxiety disorder: Secondary | ICD-10-CM

## 2023-07-21 NOTE — Telephone Encounter (Signed)
 Please review and send

## 2023-07-29 ENCOUNTER — Ambulatory Visit: Payer: 59

## 2023-08-04 ENCOUNTER — Other Ambulatory Visit: Payer: Self-pay | Admitting: Physician Assistant

## 2023-08-26 DIAGNOSIS — M7062 Trochanteric bursitis, left hip: Secondary | ICD-10-CM | POA: Diagnosis not present

## 2023-08-26 DIAGNOSIS — M1612 Unilateral primary osteoarthritis, left hip: Secondary | ICD-10-CM | POA: Diagnosis not present

## 2023-09-01 DIAGNOSIS — M7062 Trochanteric bursitis, left hip: Secondary | ICD-10-CM | POA: Diagnosis not present

## 2023-09-01 DIAGNOSIS — M1612 Unilateral primary osteoarthritis, left hip: Secondary | ICD-10-CM | POA: Diagnosis not present

## 2023-09-02 ENCOUNTER — Ambulatory Visit: Payer: 59

## 2023-09-04 ENCOUNTER — Ambulatory Visit (INDEPENDENT_AMBULATORY_CARE_PROVIDER_SITE_OTHER): Payer: 59 | Admitting: Physician Assistant

## 2023-09-04 ENCOUNTER — Encounter: Payer: Self-pay | Admitting: Physician Assistant

## 2023-09-04 VITALS — BP 138/70 | HR 90 | Temp 98.5°F | Resp 16 | Ht 64.0 in | Wt 158.2 lb

## 2023-09-04 DIAGNOSIS — I771 Stricture of artery: Secondary | ICD-10-CM

## 2023-09-04 DIAGNOSIS — I10 Essential (primary) hypertension: Secondary | ICD-10-CM | POA: Diagnosis not present

## 2023-09-04 DIAGNOSIS — N1831 Chronic kidney disease, stage 3a: Secondary | ICD-10-CM | POA: Diagnosis not present

## 2023-09-04 DIAGNOSIS — E538 Deficiency of other specified B group vitamins: Secondary | ICD-10-CM | POA: Diagnosis not present

## 2023-09-04 MED ORDER — CYANOCOBALAMIN 1000 MCG/ML IJ SOLN
1000.0000 ug | Freq: Once | INTRAMUSCULAR | Status: AC
  Administered 2023-09-04: 1000 ug via INTRAMUSCULAR

## 2023-09-04 NOTE — Progress Notes (Signed)
 [facility] [address] [place], [place] [address]  Internal MEDICINE  Office Visit Note  Patient Name: Amanda Duke  [id number]  [id number]  Date of Service: 09/04/2023  Chief Complaint  Patient presents with   Gastroesophageal Reflux   Hypothyroidism   Hypertension   Quality Metric Gaps    TDAP and Shingles vaccines    HPI Pt is here for routine follow up -since last visit has been to see neurology and vascular surgery and has 1 yr follow up plan with both. States US  did not show severe stenosis and plan for repeat in 1 year to monitor. -Had a cortisone shot for left hip with ortho last week. Sugars up some from this, but last A1c was normal. She was worried she may need another surgery, but is doing better since shot -BP at home up some today, but had been doing well prior to this was 130s/50s -she has been more tired past few days and may contribute some -granddaughter and her kids staying with her right now so a little more anxiety and staying up later -B12 shot missed last month, given today  Current Medication: Outpatient Encounter Medications as of 09/04/2023  Medication Sig Note   acetaminophen  (TYLENOL ) 500 MG tablet Take 1,000 mg by mouth every 6 (six) hours as needed for moderate pain.    aspirin  EC 81 MG tablet Take 81 mg by mouth daily. Swallow whole.    cholecalciferol  (VITAMIN D ) 1000 units tablet Take 1,000 Units by mouth daily.    clonazePAM  (KLONOPIN ) 0.5 MG tablet TAKE 1 TABLET (0.5 MG TOTAL) BY MOUTH AS NEEDED FOR ANXIETY. TAKE 1 TABLET BY MOUTH AS NEEDED    diltiazem  (CARDIZEM  LA) 240 MG 24 hr tablet TAKE 1 TABLET BY MOUTH EVERY DAY    fluticasone  (FLONASE ) 50 MCG/ACT nasal spray SPRAY 1 SPRAY INTO EACH NOSTRIL DAILY    icosapent  Ethyl (VASCEPA ) 1 g capsule Take 2 g by mouth 2 (two) times daily.    linaclotide  (LINZESS ) 290 MCG CAPS capsule Take 1 capsule (290 mcg total) by mouth daily. (Patient taking differently: Take 290 mcg by mouth daily  before breakfast.)    losartan  (COZAAR ) 100 MG tablet Take 100 mg by mouth every morning.    ondansetron  (ZOFRAN -ODT) 4 MG disintegrating tablet Take 1 tablet (4 mg total) by mouth every 8 (eight) hours as needed. (Patient taking differently: Take 4 mg by mouth every 8 (eight) hours as needed for nausea or vomiting.)    pantoprazole  (PROTONIX ) 40 MG tablet Take 40 mg by mouth as needed. 01/25/2023: Is taking every day now instead of prn   rosuvastatin  (CRESTOR ) 40 MG tablet Take 40 mg by mouth at bedtime.    senna-docusate (SENOKOT-S) 8.6-50 MG tablet Take 1 tablet by mouth at bedtime as needed for mild constipation.    solifenacin  (VESICARE ) 5 MG tablet TAKE 1 TABLET (5 MG TOTAL) BY MOUTH DAILY. (Patient taking differently: Take 5 mg by mouth as needed.)    [DISCONTINUED] cephALEXin  (KEFLEX ) 500 MG capsule Take 1 capsule (500 mg total) by mouth 2 (two) times daily.    [DISCONTINUED] clopidogrel  (PLAVIX ) 75 MG tablet Take 1 tablet (75 mg total) by mouth daily.    [EXPIRED] cyanocobalamin  (VITAMIN B12) injection 1,000 mcg     No facility-administered encounter medications on file as of 09/04/2023.    Surgical History: Past Surgical History:  Procedure Laterality Date   APPENDECTOMY  1971   ARTHRODESIS  1980   BACK SURGERY  1980   COLONOSCOPY WITH PROPOFOL  N/A 12/12/2016   Procedure: COLONOSCOPY WITH PROPOFOL ;  Surgeon: Deveron Fly, MD;  Location: [facility] ENDOSCOPY;  Service: Endoscopy;  Laterality: N/A;   COLONOSCOPY WITH PROPOFOL  N/A 07/14/2019   Procedure: COLONOSCOPY WITH PROPOFOL ;  Surgeon: Toledo, Alphonsus Jeans, MD;  Location: [facility] ENDOSCOPY;  Service: Gastroenterology;  Laterality: N/A;   ESOPHAGOGASTRODUODENOSCOPY N/A 12/27/2021   Procedure: ESOPHAGOGASTRODUODENOSCOPY (EGD);  Surgeon: Conrado Delay, DO;  Location: [facility] ENDOSCOPY;  Service: General;  Laterality: N/A;   ESOPHAGOGASTRODUODENOSCOPY (EGD) WITH PROPOFOL  N/A 12/12/2016   Procedure: ESOPHAGOGASTRODUODENOSCOPY (EGD) WITH  PROPOFOL ;  Surgeon: Deveron Fly, MD;  Location: [facility] ENDOSCOPY;  Service: Endoscopy;  Laterality: N/A;   ESOPHAGOGASTRODUODENOSCOPY (EGD) WITH PROPOFOL  N/A 07/14/2019   Procedure: ESOPHAGOGASTRODUODENOSCOPY (EGD) WITH PROPOFOL ;  Surgeon: Toledo, Alphonsus Jeans, MD;  Location: [facility] ENDOSCOPY;  Service: Gastroenterology;  Laterality: N/A;   INSERTION OF MESH  02/05/2022   Procedure: INSERTION OF MESH;  Surgeon: Alben Alma, MD;  Location: [facility] ORS;  Service: General;;   LAPAROSCOPIC UNILATERAL SALPINGO OOPHERECTOMY  1985   right   LEFT HEART CATH AND CORONARY ANGIOGRAPHY Left 09/25/2001   Procedure: LEFT HEART CATH AND CORONARY ANGIOGRAPHY; Location: [facility]; Surgeon: Thais Fill, MD   LEFT HEART CATH AND CORONARY ANGIOGRAPHY Left 06/09/2003   Procedure: LEFT HEART CATH AND CORONARY ANGIOGRAPHY; Location: [facility]; Surgeon: Thais Fill, MD   ORIF ANKLE FRACTURE Right    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    TONSILLECTOMY  1948   TUBAL LIGATION  1971   UMBILICAL HERNIA REPAIR N/A 02/05/2022   Procedure: HERNIA REPAIR UMBILICAL ADULT;  Surgeon: Alben Alma, MD;  Location: [facility] ORS;  Service: General;  Laterality: N/A;   VAGINAL HYSTERECTOMY  1985   d/t heavy bleeding   XI ROBOTIC ASSISTED PARAESOPHAGEAL HERNIA REPAIR N/A 02/05/2022   Procedure: XI ROBOTIC ASSISTED PARAESOPHAGEAL HERNIA REPAIR, RNFA to assist;  Surgeon: Alben Alma, MD;  Location: [facility] ORS;  Service: General;  Laterality: N/A;    Medical History: Past Medical History:  Diagnosis Date   Anxiety    Aortic atherosclerosis (HCC)    Arthritis    Bilateral renal cysts    Coronary artery disease 09/25/2001   a.) LHC 09/25/2001: EF 78%. 20% pLAD - med mgmt; b.) LHC 06/09/2003: EF 63%. 20% pLCx, 20% pLAD, 50% mLAD, 50% dLAD - med mgmt.   Diastolic dysfunction 12/30/2017   a.) Stress echo 12/30/2017: ED >55%, triv PR, mild AR.MR, mod TR, G1DD; b.) TTE 03/30/2020: EF >55%, mild LVH, mild panvalvular  regurgitation.   Gastritis    GERD (gastroesophageal reflux disease)    Hepatic steatosis    History of 2019 novel coronavirus disease (COVID-19) 01/11/2022   History of hiatal hernia    a.) s/p repair 01/2022   Hyperlipemia    Hyperplastic colon polyp    Hypertension    Hypoglycemia 05/2022   this is something new-seeing pcp for this currently   Pneumonia    PONV (postoperative nausea and vomiting)    a.) PONV following orthopedic surgery (hip) in 2022   Prediabetes    PSVT (paroxysmal supraventricular tachycardia) (HCC)    Redundant colon    Tubular adenoma of colon    Umbilical hernia     Family History: Family History  Problem Relation Age of Onset   Heart failure Mother    Stroke Mother    Breast cancer Mother        dx at age 57 yo   Diabetes  Mother    Osteoporosis Mother    Emphysema Father    Ovarian cancer Sister        dx at age 91 yo   Asthma Sister    Diabetes Daughter    Heart disease Daughter    COPD Daughter     Social History   Socioeconomic History   Marital status: Widowed    Spouse name: Not on file   Number of children: Not on file   Years of education: Not on file   Highest education level: Not on file  Occupational History   Not on file  Tobacco Use   Smoking status: Former    Current packs/day: 0.00    Average packs/day: 0.5 packs/day for 5.0 years (2.5 ttl pk-yrs)    Types: Cigarettes    Start date: 76    Quit date: 63    Years since quitting: 45.3    Passive exposure: Past   Smokeless tobacco: Never  Vaping Use   Vaping status: Never Used  Substance and Sexual Activity   Alcohol use: No    Alcohol/week: 0.0 standard drinks of alcohol   Drug use: Never   Sexual activity: Not Currently  Other Topics Concern   Not on file  Social History Narrative   Not on file   Social Drivers of Health   Financial Resource Strain: Low Risk  (11/17/2020)   Overall Financial Resource Strain (CARDIA)    Difficulty of Paying Living  Expenses: Not very hard  Food Insecurity: No Food Insecurity (01/25/2023)   Hunger Vital Sign    Worried About Running Out of Food in the Last Year: Never true    Ran Out of Food in the Last Year: Never true  Transportation Needs: No Transportation Needs (01/25/2023)   PRAPARE - Administrator, Civil Service (Medical): No    Lack of Transportation (Non-Medical): No  Physical Activity: Inactive (01/10/2019)   Exercise Vital Sign    Days of Exercise per Week: 0 days    Minutes of Exercise per Session: 0 min  Stress: No Stress Concern Present (01/10/2019)   [employer] of Occupational Health - Occupational Stress Questionnaire    Feeling of Stress : Not at all  Social Connections: Socially Isolated (01/10/2019)   Social Connection and Isolation Panel [NHANES]    Frequency of Communication with Friends and Family: Three times a week    Frequency of Social Gatherings with Friends and Family: Three times a week    Attends Religious Services: Never    Active Member of Clubs or Organizations: No    Attends Banker Meetings: Never    Marital Status: Widowed  Intimate Partner Violence: Unknown (01/25/2023)   Humiliation, Afraid, Rape, and Kick questionnaire    Fear of Current or Ex-Partner: No    Emotionally Abused: No    Physically Abused: Not on file    Sexually Abused: No      Review of Systems  Constitutional:  Positive for fatigue. Negative for chills and unexpected weight change.  HENT:  Negative for congestion, dental problem, postnasal drip, rhinorrhea, sneezing and sore throat.   Eyes:  Negative for redness.  Respiratory: Negative.  Negative for cough, chest tightness, shortness of breath and wheezing.   Cardiovascular: Negative.  Negative for chest pain and palpitations.  Gastrointestinal:  Negative for abdominal pain, constipation, diarrhea, nausea and vomiting.  Genitourinary:  Negative for dysuria and frequency.  Musculoskeletal:  Positive for  arthralgias. Negative for back pain,  joint swelling and neck pain.  Skin:  Negative for rash.  Neurological: Negative.  Negative for tremors and numbness.  Hematological:  Negative for adenopathy. Does not bruise/bleed easily.  Psychiatric/Behavioral:  Negative for behavioral problems (Depression) and suicidal ideas. The patient is nervous/anxious.     Vital Signs: BP 138/70 Comment: 155/90  Pulse 90   Temp 98.5 F (36.9 C)   Resp 16   Ht 5\' 4"  (1.626 m)   Wt 158 lb 3.2 oz (71.8 kg)   SpO2 98%   BMI 27.15 kg/m    Physical Exam Vitals and nursing note reviewed.  Constitutional:      Appearance: Normal appearance.  HENT:     Head: Normocephalic and atraumatic.  Eyes:     Extraocular Movements: Extraocular movements intact.  Cardiovascular:     Rate and Rhythm: Normal rate and regular rhythm.  Pulmonary:     Effort: Pulmonary effort is normal.     Breath sounds: Normal breath sounds.  Skin:    General: Skin is warm and dry.  Neurological:     Mental Status: She is alert.  Psychiatric:        Mood and Affect: Mood normal.        Behavior: Behavior normal.        Assessment/Plan: 1. Benign essential HTN (Primary) Initially elevated, but improved on recheck. Continue to monitor  2. Subclavian artery stenosis, right (HCC) Followed by vascular  3. B12 deficiency - cyanocobalamin  (VITAMIN B12) injection 1,000 mcg  4. Stage 3a chronic kidney disease (HCC) Improved on recent labs   General Counseling: Taline verbalizes understanding of the findings of todays visit and agrees with plan of treatment. I have discussed any further diagnostic evaluation that may be needed or ordered today. We also reviewed her medications today. she has been encouraged to call the office with any questions or concerns that should arise related to todays visit.    No orders of the defined types were placed in this encounter.   Meds ordered this encounter  Medications    cyanocobalamin  (VITAMIN B12) injection 1,000 mcg    This patient was seen by Taylor Favia, PA-C in collaboration with Dr. Verneta Gone as a part of collaborative care agreement.   Total time spent:30 Minutes Time spent includes review of chart, medications, test results, and follow up plan with the patient.      Dr Fozia M Khan Internal medicine

## 2023-09-10 DIAGNOSIS — I471 Supraventricular tachycardia, unspecified: Secondary | ICD-10-CM | POA: Diagnosis not present

## 2023-09-10 DIAGNOSIS — G459 Transient cerebral ischemic attack, unspecified: Secondary | ICD-10-CM | POA: Diagnosis not present

## 2023-09-10 DIAGNOSIS — R002 Palpitations: Secondary | ICD-10-CM | POA: Diagnosis not present

## 2023-09-10 DIAGNOSIS — I10 Essential (primary) hypertension: Secondary | ICD-10-CM | POA: Diagnosis not present

## 2023-09-16 ENCOUNTER — Encounter (INDEPENDENT_AMBULATORY_CARE_PROVIDER_SITE_OTHER): Payer: Self-pay

## 2023-09-23 ENCOUNTER — Ambulatory Visit (INDEPENDENT_AMBULATORY_CARE_PROVIDER_SITE_OTHER)

## 2023-09-23 DIAGNOSIS — E538 Deficiency of other specified B group vitamins: Secondary | ICD-10-CM

## 2023-09-23 MED ORDER — CYANOCOBALAMIN 1000 MCG/ML IJ SOLN
1000.0000 ug | Freq: Once | INTRAMUSCULAR | Status: AC
  Administered 2023-09-23: 1000 ug via INTRAMUSCULAR

## 2023-09-30 ENCOUNTER — Ambulatory Visit: Payer: 59

## 2023-10-02 DIAGNOSIS — K5900 Constipation, unspecified: Secondary | ICD-10-CM | POA: Diagnosis not present

## 2023-10-02 DIAGNOSIS — R300 Dysuria: Secondary | ICD-10-CM | POA: Diagnosis not present

## 2023-10-02 DIAGNOSIS — R350 Frequency of micturition: Secondary | ICD-10-CM | POA: Diagnosis not present

## 2023-10-02 DIAGNOSIS — N390 Urinary tract infection, site not specified: Secondary | ICD-10-CM | POA: Diagnosis not present

## 2023-10-28 ENCOUNTER — Ambulatory Visit: Payer: 59

## 2023-11-01 ENCOUNTER — Other Ambulatory Visit: Payer: Self-pay | Admitting: Physician Assistant

## 2023-11-11 ENCOUNTER — Other Ambulatory Visit: Payer: Self-pay | Admitting: Physician Assistant

## 2023-11-11 DIAGNOSIS — F411 Generalized anxiety disorder: Secondary | ICD-10-CM

## 2023-11-26 ENCOUNTER — Ambulatory Visit (INDEPENDENT_AMBULATORY_CARE_PROVIDER_SITE_OTHER)

## 2023-11-26 DIAGNOSIS — E538 Deficiency of other specified B group vitamins: Secondary | ICD-10-CM

## 2023-11-26 MED ORDER — CYANOCOBALAMIN 1000 MCG/ML IJ SOLN
1000.0000 ug | Freq: Once | INTRAMUSCULAR | Status: AC
  Administered 2023-11-26: 1000 ug via INTRAMUSCULAR

## 2023-12-04 DIAGNOSIS — H43813 Vitreous degeneration, bilateral: Secondary | ICD-10-CM | POA: Diagnosis not present

## 2023-12-04 DIAGNOSIS — Z83518 Family history of other specified eye disorder: Secondary | ICD-10-CM | POA: Diagnosis not present

## 2023-12-04 DIAGNOSIS — H2513 Age-related nuclear cataract, bilateral: Secondary | ICD-10-CM | POA: Diagnosis not present

## 2023-12-08 ENCOUNTER — Emergency Department

## 2023-12-08 ENCOUNTER — Emergency Department
Admission: EM | Admit: 2023-12-08 | Discharge: 2023-12-08 | Disposition: A | Discharge status: Home / Self Care | Attending: Emergency Medicine | Admitting: Emergency Medicine

## 2023-12-08 ENCOUNTER — Other Ambulatory Visit: Payer: Self-pay

## 2023-12-08 DIAGNOSIS — N390 Urinary tract infection, site not specified: Secondary | ICD-10-CM | POA: Diagnosis not present

## 2023-12-08 DIAGNOSIS — I2510 Atherosclerotic heart disease of native coronary artery without angina pectoris: Secondary | ICD-10-CM | POA: Insufficient documentation

## 2023-12-08 DIAGNOSIS — I10 Essential (primary) hypertension: Secondary | ICD-10-CM | POA: Insufficient documentation

## 2023-12-08 DIAGNOSIS — R519 Headache, unspecified: Secondary | ICD-10-CM | POA: Diagnosis not present

## 2023-12-08 LAB — CBC WITH DIFFERENTIAL/PLATELET
Abs Immature Granulocytes: 0.02 K/uL (ref 0.00–0.07)
Basophils Absolute: 0.1 K/uL (ref 0.0–0.1)
Basophils Relative: 1 %
Eosinophils Absolute: 0.1 K/uL (ref 0.0–0.5)
Eosinophils Relative: 1 %
HCT: 43.4 % (ref 36.0–46.0)
Hemoglobin: 14.1 g/dL (ref 12.0–15.0)
Immature Granulocytes: 0 %
Lymphocytes Relative: 29 %
Lymphs Abs: 2.3 K/uL (ref 0.7–4.0)
MCH: 29.1 pg (ref 26.0–34.0)
MCHC: 32.5 g/dL (ref 30.0–36.0)
MCV: 89.5 fL (ref 80.0–100.0)
Monocytes Absolute: 0.6 K/uL (ref 0.1–1.0)
Monocytes Relative: 7 %
Neutro Abs: 4.9 K/uL (ref 1.7–7.7)
Neutrophils Relative %: 62 %
Platelets: 278 K/uL (ref 150–400)
RBC: 4.85 MIL/uL (ref 3.87–5.11)
RDW: 15.8 % — ABNORMAL HIGH (ref 11.5–15.5)
WBC: 7.9 K/uL (ref 4.0–10.5)
nRBC: 0 % (ref 0.0–0.2)

## 2023-12-08 LAB — URINALYSIS, ROUTINE W REFLEX MICROSCOPIC
Bilirubin Urine: NEGATIVE
Glucose, UA: NEGATIVE mg/dL
Hgb urine dipstick: NEGATIVE
Ketones, ur: NEGATIVE mg/dL
Nitrite: NEGATIVE
Protein, ur: NEGATIVE mg/dL
Specific Gravity, Urine: 1.003 — ABNORMAL LOW (ref 1.005–1.030)
pH: 7 (ref 5.0–8.0)

## 2023-12-08 LAB — COMPREHENSIVE METABOLIC PANEL WITH GFR
ALT: 15 U/L (ref 0–44)
AST: 23 U/L (ref 15–41)
Albumin: 4.2 g/dL (ref 3.5–5.0)
Alkaline Phosphatase: 78 U/L (ref 38–126)
Anion gap: 8 (ref 5–15)
BUN: 12 mg/dL (ref 8–23)
CO2: 24 mmol/L (ref 22–32)
Calcium: 9.1 mg/dL (ref 8.9–10.3)
Chloride: 106 mmol/L (ref 98–111)
Creatinine, Ser: 1.07 mg/dL — ABNORMAL HIGH (ref 0.44–1.00)
GFR, Estimated: 52 mL/min — ABNORMAL LOW (ref >60)
Glucose, Bld: 115 mg/dL — ABNORMAL HIGH (ref 70–99)
Potassium: 3.8 mmol/L (ref 3.5–5.1)
Sodium: 138 mmol/L (ref 135–145)
Total Bilirubin: 0.8 mg/dL (ref 0.0–1.2)
Total Protein: 7.5 g/dL (ref 6.5–8.1)

## 2023-12-08 MED ORDER — CLONIDINE HCL 0.1 MG PO TABS
0.1000 mg | ORAL_TABLET | Freq: Once | ORAL | Status: AC
  Administered 2023-12-08 (×2): 0.1 mg via ORAL
  Filled 2023-12-08: qty 1

## 2023-12-08 MED ORDER — CEPHALEXIN 500 MG PO CAPS: 500.0000 mg | ORAL_CAPSULE | Freq: Two times a day (BID) | ORAL | 0 refills | Status: AC

## 2023-12-08 MED ORDER — CEPHALEXIN 500 MG PO CAPS
500.0000 mg | ORAL_CAPSULE | Freq: Once | ORAL | Status: AC
  Administered 2023-12-08 (×2): 500 mg via ORAL
  Filled 2023-12-08: qty 1

## 2023-12-08 MED ORDER — ACETAMINOPHEN 325 MG PO TABS
650.0000 mg | ORAL_TABLET | Freq: Once | ORAL | Status: AC
  Administered 2023-12-08 (×2): 650 mg via ORAL
  Filled 2023-12-08: qty 2

## 2023-12-08 NOTE — Discharge Instructions (Signed)
 Your blood work today was normal.  Your urinalysis showed signs of infection.  Please take the antibiotics as prescribed.  The CT scan of your head was normal.  I believe your headaches are the result of high blood pressure.  Please continue to take your blood pressure medications as prescribed.  Follow-up with your primary care provider to discuss possible medication changes.  Return to the emergency department with any worsening symptoms.

## 2023-12-08 NOTE — ED Provider Notes (Signed)
 [facility] Provider Note    Event Date/Time   First MD Initiated Contact with Patient 12/08/23 1728     (approximate)   History   Hypertension   HPI  Amanda Duke is a 83 y.o. female with PMH of hypertension, CAD presents for evaluation of hypertension, headache and urinary frequency.  Patient states that she has had a headache on and off since last week.  She does not have a history of headaches.  She thinks that it may be due to her blood pressure being elevated but she is not sure as she had not been checking her blood pressure the whole week.  She is able to get some relief with Tylenol  but the headache never fully goes away.  Patient also endorses urinary frequency, denies dysuria, back pain and fevers.  She states she had a UTI in June which presented with similar symptoms.  She denies chest pain and shortness of breath.  She endorses some visual changes but states she thinks this is due to cataracts.      Physical Exam   Triage Vital Signs: ED Triage Vitals  Encounter Vitals Group     BP 12/08/23 1431 (!) 171/95     Girls Systolic BP Percentile --      Girls Diastolic BP Percentile --      Boys Systolic BP Percentile --      Boys Diastolic BP Percentile --      Pulse Rate 12/08/23 1431 95     Resp 12/08/23 1431 18     Temp 12/08/23 1431 98.2 F (36.8 C)     Temp src --      SpO2 12/08/23 1431 95 %     Weight 12/08/23 1432 158 lb (71.7 kg)     Height 12/08/23 1432 5' 5 (1.651 m)     Head Circumference --      Peak Flow --      Pain Score 12/08/23 1431 7     Pain Loc --      Pain Education --      Exclude from Growth Chart --     Most recent vital signs: Vitals:   12/08/23 1431 12/08/23 2024  BP: (!) 171/95 115/70  Pulse: 95 62  Resp: 18 16  Temp: 98.2 F (36.8 C) 97.7 F (36.5 C)  SpO2: 95% 96%   General: Awake, no distress.  CV:  Good peripheral perfusion.  RRR Resp:  Normal effort.  CTAB. Abd:  No distention.   Other:  PERRL, EOM intact, no focal neurodeficits, no ataxia, no pronator drift, able to walk without difficulty   ED Results / Procedures / Treatments   Labs (all labs ordered are listed, but only abnormal results are displayed) Labs Reviewed  CBC WITH DIFFERENTIAL/PLATELET - Abnormal; Notable for the following components:      Result Value   RDW 15.8 (*)    All other components within normal limits  COMPREHENSIVE METABOLIC PANEL WITH GFR - Abnormal; Notable for the following components:   Glucose, Bld 115 (*)    Creatinine, Ser 1.07 (*)    GFR, Estimated 52 (*)    All other components within normal limits  URINALYSIS, ROUTINE W REFLEX MICROSCOPIC - Abnormal; Notable for the following components:   Color, Urine STRAW (*)    APPearance CLEAR (*)    Specific Gravity, Urine 1.003 (*)    Leukocytes,Ua SMALL (*)    Bacteria, UA FEW (*)    All  other components within normal limits   RADIOLOGY  CT head obtained, interpreted the images as well as reviewed the radiologist report which was negative for acute abnormalities.  PROCEDURES:  Critical Care performed: No  Procedures   MEDICATIONS ORDERED IN ED: Medications  cloNIDine  (CATAPRES ) tablet 0.1 mg (0.1 mg Oral Given 12/08/23 1750)  acetaminophen  (TYLENOL ) tablet 650 mg (650 mg Oral Given 12/08/23 2155)  cephALEXin  (KEFLEX ) capsule 500 mg (500 mg Oral Given 12/08/23 2155)     IMPRESSION / MDM / ASSESSMENT AND PLAN / ED COURSE  I reviewed the triage vital signs and the nursing notes.                             83 year old female presents for evaluation of hypertension.  Blood pressure is elevated otherwise vital signs are stable.  Differential diagnosis includes, but is not limited to, uncontrolled hypertension, tension headache, intracranial bleed, UTI.  Patient's presentation is most consistent with acute complicated illness / injury requiring diagnostic workup.  CMP and CBC unremarkable.  UA shows small leukocytes  with white blood cells and few bacteria.  Given that patient is also symptomatic we will treat her for UTI.  Low suspicion for pyelonephritis as she is not having any back pain, fevers and there is no leukocytosis.  Will obtain CT of the head given patient's new onset of headaches and report of some visual changes.  If negative feel patient will be stable for outpatient management.  Will give a dose of clonidine  and recheck blood pressure.  Would recommend that she follow-up with her primary care provider for possible medication adjustment.  Clinical Course as of 12/08/23 2200  Mon Dec 08, 2023  2036 BP improved after receiving the clonidine . [LD]  2146 Patient reassessed, headache has improved since her BP has improved but would like some tylenol . Will also get her the first dose of her antibiotics for her UTI. Patient has been up to use the bathroom several times while in the ED. [LD]  2157 CT Head Wo Contrast Negative for acute abnormalities. [LD]  2157 Results reviewed with the patient.  Believe that her headache is secondary to hypertension as it improved without medication when her blood pressure dropped.  Recommended she follow-up with her primary care provider.  She has an appointment scheduled for tomorrow morning.  Discussed return precautions.  She voiced understanding, all questions were answered and she was stable at discharge. [LD]    Clinical Course User Index [LD] Cleaster Tinnie LABOR, PA-C     FINAL CLINICAL IMPRESSION(S) / ED DIAGNOSES   Final diagnoses:  Hypertension, unspecified type  Urinary tract infection without hematuria, site unspecified     Rx / DC Orders   ED Discharge Orders          Ordered    cephALEXin  (KEFLEX ) 500 MG capsule  2 times daily        12/08/23 2159             Note:  This document was prepared using Dragon voice recognition software and may include unintentional dictation errors.   Cleaster Tinnie LABOR, PA-C 12/08/23 2202     Dorothyann Drivers, MD 12/08/23 2252

## 2023-12-08 NOTE — ED Triage Notes (Signed)
 Pt comes in via pov with complaints of hypertension. Pt states that the last time she took her bp it was 191/85. Pt complains of headache at this time 7/10.  Pt states that she has had her head pressure off and on the past few days. Pt has been taking tylenol  for pain, but states that medicine is no longer working. Pt has a history of hypertension, and took her BP medication this morning. Pt also states that she is worried that she may have a UTI, due to frequent urination. Pt is a&o x4.

## 2023-12-08 NOTE — ED Notes (Signed)
 Assumed care of pt. Introduced self to pt. Call bell in reach. Pt has needs at this time.

## 2023-12-08 NOTE — ED Notes (Signed)
 Pt given DC instructions. Pt verbalized understanding of medications and follow up care. Pt taken from ED in wheelchair by this RN. Pt in NAD at time of departure.

## 2023-12-09 ENCOUNTER — Ambulatory Visit (INDEPENDENT_AMBULATORY_CARE_PROVIDER_SITE_OTHER): Admitting: Nurse Practitioner

## 2023-12-09 ENCOUNTER — Encounter: Payer: Self-pay | Admitting: Nurse Practitioner

## 2023-12-09 VITALS — BP 127/69 | HR 67 | Temp 98.1°F | Resp 16 | Ht 64.0 in | Wt 157.8 lb

## 2023-12-09 DIAGNOSIS — I10 Essential (primary) hypertension: Secondary | ICD-10-CM

## 2023-12-09 DIAGNOSIS — N390 Urinary tract infection, site not specified: Secondary | ICD-10-CM | POA: Diagnosis not present

## 2023-12-09 DIAGNOSIS — G44209 Tension-type headache, unspecified, not intractable: Secondary | ICD-10-CM

## 2023-12-09 NOTE — Progress Notes (Signed)
 [facility] [address] [place], [place] [address]  Internal MEDICINE  Office Visit Note  Patient Name: Amanda Duke  [id number]  [id number]  Date of Service: 12/09/2023  Chief Complaint  Patient presents with   Acute Visit    Er visit- headache and blood pressure     HPI Amanda Duke presents for a follow-up visit for recent ED visit.  Went to ED yesterday and was treated for high BP and a UTI. She was put on cephalexin  for UTI.  Headache has resolved and ear pain has resolved. She was given clonidine  in the ER for elevated BP which brought her BP down.    Current Medication: Outpatient Encounter Medications as of 12/09/2023  Medication Sig Note   acetaminophen  (TYLENOL ) 500 MG tablet Take 1,000 mg by mouth every 6 (six) hours as needed for moderate pain.    aspirin  EC 81 MG tablet Take 81 mg by mouth daily. Swallow whole.    cephALEXin  (KEFLEX ) 500 MG capsule Take 1 capsule (500 mg total) by mouth 2 (two) times daily for 7 days.    cholecalciferol  (VITAMIN D ) 1000 units tablet Take 1,000 Units by mouth daily.    clonazePAM  (KLONOPIN ) 0.5 MG tablet TAKE 1 TABLET (0.5 MG TOTAL) BY MOUTH AS NEEDED FOR ANXIETY. TAKE 1 TABLET BY MOUTH AS NEEDED    diltiazem  (CARDIZEM  LA) 240 MG 24 hr tablet TAKE 1 TABLET BY MOUTH EVERY DAY    fluticasone  (FLONASE ) 50 MCG/ACT nasal spray SPRAY 1 SPRAY INTO EACH NOSTRIL EVERY DAY    icosapent  Ethyl (VASCEPA ) 1 g capsule Take 2 g by mouth 2 (two) times daily.    linaclotide  (LINZESS ) 290 MCG CAPS capsule Take 1 capsule (290 mcg total) by mouth daily. (Patient taking differently: Take 290 mcg by mouth daily before breakfast.)    losartan  (COZAAR ) 100 MG tablet Take 100 mg by mouth every morning.    ondansetron  (ZOFRAN -ODT) 4 MG disintegrating tablet Take 1 tablet (4 mg total) by mouth every 8 (eight) hours as needed. (Patient taking differently: Take 4 mg by mouth every 8 (eight) hours as needed for nausea or vomiting.)    pantoprazole   (PROTONIX ) 40 MG tablet Take 40 mg by mouth as needed. 01/25/2023: Is taking every day now instead of prn   rosuvastatin  (CRESTOR ) 40 MG tablet Take 40 mg by mouth at bedtime.    senna-docusate (SENOKOT-S) 8.6-50 MG tablet Take 1 tablet by mouth at bedtime as needed for mild constipation.    solifenacin  (VESICARE ) 5 MG tablet TAKE 1 TABLET (5 MG TOTAL) BY MOUTH DAILY. (Patient taking differently: Take 5 mg by mouth as needed.)    No facility-administered encounter medications on file as of 12/09/2023.    Surgical History: Past Surgical History:  Procedure Laterality Date   APPENDECTOMY  1971   ARTHRODESIS  1980   BACK SURGERY  1980   COLONOSCOPY WITH PROPOFOL  N/A 12/12/2016   Procedure: COLONOSCOPY WITH PROPOFOL ;  Surgeon: Gaylyn Gladis PENNER, MD;  Location: [facility] ENDOSCOPY;  Service: Endoscopy;  Laterality: N/A;   COLONOSCOPY WITH PROPOFOL  N/A 07/14/2019   Procedure: COLONOSCOPY WITH PROPOFOL ;  Surgeon: Toledo, Ladell POUR, MD;  Location: [facility] ENDOSCOPY;  Service: Gastroenterology;  Laterality: N/A;   ESOPHAGOGASTRODUODENOSCOPY N/A 12/27/2021   Procedure: ESOPHAGOGASTRODUODENOSCOPY (EGD);  Surgeon: Tye Millet, DO;  Location: [facility] ENDOSCOPY;  Service: General;  Laterality: N/A;   ESOPHAGOGASTRODUODENOSCOPY (EGD) WITH PROPOFOL  N/A 12/12/2016   Procedure: ESOPHAGOGASTRODUODENOSCOPY (EGD) WITH PROPOFOL ;  Surgeon: Gaylyn Gladis PENNER, MD;  Location: [facility]  ENDOSCOPY;  Service: Endoscopy;  Laterality: N/A;   ESOPHAGOGASTRODUODENOSCOPY (EGD) WITH PROPOFOL  N/A 07/14/2019   Procedure: ESOPHAGOGASTRODUODENOSCOPY (EGD) WITH PROPOFOL ;  Surgeon: Toledo, Ladell POUR, MD;  Location: [facility] ENDOSCOPY;  Service: Gastroenterology;  Laterality: N/A;   INSERTION OF MESH  02/05/2022   Procedure: INSERTION OF MESH;  Surgeon: Jordis Laneta FALCON, MD;  Location: [facility] ORS;  Service: General;;   LAPAROSCOPIC UNILATERAL SALPINGO OOPHERECTOMY  1985   right   LEFT HEART CATH AND CORONARY ANGIOGRAPHY Left 09/25/2001   Procedure:  LEFT HEART CATH AND CORONARY ANGIOGRAPHY; Location: [facility]; Surgeon: Margie Lovelace, MD   LEFT HEART CATH AND CORONARY ANGIOGRAPHY Left 06/09/2003   Procedure: LEFT HEART CATH AND CORONARY ANGIOGRAPHY; Location: [facility]; Surgeon: Margie Lovelace, MD   ORIF ANKLE FRACTURE Right    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    TONSILLECTOMY  1948   TUBAL LIGATION  1971   UMBILICAL HERNIA REPAIR N/A 02/05/2022   Procedure: HERNIA REPAIR UMBILICAL ADULT;  Surgeon: Jordis Laneta FALCON, MD;  Location: [facility] ORS;  Service: General;  Laterality: N/A;   VAGINAL HYSTERECTOMY  1985   d/t heavy bleeding   XI ROBOTIC ASSISTED PARAESOPHAGEAL HERNIA REPAIR N/A 02/05/2022   Procedure: XI ROBOTIC ASSISTED PARAESOPHAGEAL HERNIA REPAIR, RNFA to assist;  Surgeon: Jordis Laneta FALCON, MD;  Location: [facility] ORS;  Service: General;  Laterality: N/A;    Medical History: Past Medical History:  Diagnosis Date   Anxiety    Aortic atherosclerosis (HCC)    Arthritis    Bilateral renal cysts    Coronary artery disease 09/25/2001   a.) LHC 09/25/2001: EF 78%. 20% pLAD - med mgmt; b.) LHC 06/09/2003: EF 63%. 20% pLCx, 20% pLAD, 50% mLAD, 50% dLAD - med mgmt.   Diastolic dysfunction 12/30/2017   a.) Stress echo 12/30/2017: ED >55%, triv PR, mild AR.MR, mod TR, G1DD; b.) TTE 03/30/2020: EF >55%, mild LVH, mild panvalvular regurgitation.   Gastritis    GERD (gastroesophageal reflux disease)    Hepatic steatosis    History of 2019 novel coronavirus disease (COVID-19) 01/11/2022   History of hiatal hernia    a.) s/p repair 01/2022   Hyperlipemia    Hyperplastic colon polyp    Hypertension    Hypoglycemia 05/2022   this is something new-seeing pcp for this currently   Pneumonia    PONV (postoperative nausea and vomiting)    a.) PONV following orthopedic surgery (hip) in 2022   Prediabetes    PSVT (paroxysmal supraventricular tachycardia) (HCC)    Redundant colon    Tubular adenoma of colon    Umbilical hernia      Family History: Family History  Problem Relation Age of Onset   Heart failure Mother    Stroke Mother    Breast cancer Mother        dx at age 2 yo   Diabetes Mother    Osteoporosis Mother    Emphysema Father    Ovarian cancer Sister        dx at age 41 yo   Asthma Sister    Diabetes Daughter    Heart disease Daughter    COPD Daughter     Social History   Socioeconomic History   Marital status: Widowed    Spouse name: Not on file   Number of children: Not on file   Years of education: Not on file   Highest education level: Not on file  Occupational History   Not on file  Tobacco Use  Smoking status: Former    Current packs/day: 0.00    Average packs/day: 0.5 packs/day for 5.0 years (2.5 ttl pk-yrs)    Types: Cigarettes    Start date: 35    Quit date: 52    Years since quitting: 45.6    Passive exposure: Past   Smokeless tobacco: Never  Vaping Use   Vaping status: Never Used  Substance and Sexual Activity   Alcohol use: No    Alcohol/week: 0.0 standard drinks of alcohol   Drug use: Never   Sexual activity: Not Currently  Other Topics Concern   Not on file  Social History Narrative   Not on file   Social Drivers of Health   Financial Resource Strain: Low Risk  (11/17/2020)   Overall Financial Resource Strain (CARDIA)    Difficulty of Paying Living Expenses: Not very hard  Food Insecurity: No Food Insecurity (01/25/2023)   Hunger Vital Sign    Worried About Running Out of Food in the Last Year: Never true    Ran Out of Food in the Last Year: Never true  Transportation Needs: No Transportation Needs (01/25/2023)   PRAPARE - Administrator, Civil Service (Medical): No    Lack of Transportation (Non-Medical): No  Physical Activity: Inactive (01/10/2019)   Exercise Vital Sign    Days of Exercise per Week: 0 days    Minutes of Exercise per Session: 0 min  Stress: No Stress Concern Present (01/10/2019)   [employer] of Occupational  Health - Occupational Stress Questionnaire    Feeling of Stress : Not at all  Social Connections: Socially Isolated (01/10/2019)   Social Connection and Isolation Panel    Frequency of Communication with Friends and Family: Three times a week    Frequency of Social Gatherings with Friends and Family: Three times a week    Attends Religious Services: Never    Active Member of Clubs or Organizations: No    Attends Banker Meetings: Never    Marital Status: Widowed  Intimate Partner Violence: Unknown (01/25/2023)   Humiliation, Afraid, Rape, and Kick questionnaire    Fear of Current or Ex-Partner: No    Emotionally Abused: No    Physically Abused: Not on file    Sexually Abused: No      Review of Systems  Constitutional:  Positive for fatigue. Negative for chills and unexpected weight change.  HENT:  Negative for congestion, dental problem, postnasal drip, rhinorrhea, sneezing and sore throat.   Eyes:  Negative for redness.  Respiratory: Negative.  Negative for cough, chest tightness, shortness of breath and wheezing.   Cardiovascular: Negative.  Negative for chest pain and palpitations.  Gastrointestinal:  Negative for abdominal pain, constipation, diarrhea, nausea and vomiting.  Genitourinary:  Negative for dysuria and frequency.  Musculoskeletal:  Positive for arthralgias. Negative for back pain, joint swelling and neck pain.  Skin:  Negative for rash.  Neurological: Negative.  Negative for tremors and numbness.  Hematological:  Negative for adenopathy. Does not bruise/bleed easily.  Psychiatric/Behavioral:  Negative for behavioral problems (Depression) and suicidal ideas. The patient is nervous/anxious.     Vital Signs: BP 127/69   Pulse 67   Temp 98.1 F (36.7 C)   Resp 16   Ht 5' 4 (1.626 m)   Wt 157 lb 12.8 oz (71.6 kg)   SpO2 97%   BMI 27.09 kg/m    Physical Exam Vitals and nursing note reviewed.  Constitutional:      Appearance:  Normal appearance.   HENT:     Head: Normocephalic and atraumatic.  Eyes:     Extraocular Movements: Extraocular movements intact.  Cardiovascular:     Rate and Rhythm: Normal rate and regular rhythm.  Pulmonary:     Effort: Pulmonary effort is normal.     Breath sounds: Normal breath sounds.  Skin:    General: Skin is warm and dry.  Neurological:     Mental Status: She is alert.  Psychiatric:        Mood and Affect: Mood normal.        Behavior: Behavior normal.        Assessment/Plan: 1. Benign essential HTN (Primary) Stable, has been doing better on current medications.   2. Acute non intractable tension-type headache Improved with BP under control.   3. Urinary tract infection without hematuria, site unspecified Finish cephalexin  as prescribed by the ED provider until gone.     General Counseling: Amanda Duke verbalizes understanding of the findings of todays visit and agrees with plan of treatment. I have discussed any further diagnostic evaluation that may be needed or ordered today. We also reviewed her medications today. she has been encouraged to call the office with any questions or concerns that should arise related to todays visit.    No orders of the defined types were placed in this encounter.   No orders of the defined types were placed in this encounter.   Return if symptoms worsen or fail to improve, for keep appt with lauren in september.   Total time spent:20 Minutes Time spent includes review of chart, medications, test results, and follow up plan with the patient.   [place] Controlled Substance Database was reviewed by me.  This patient was seen by Mardy Maxin, FNP-C in collaboration with Dr. Sigrid Bathe as a part of collaborative care agreement.   Amanda Mikus R. Maxin, MSN, FNP-C Internal medicine

## 2023-12-11 DIAGNOSIS — I6523 Occlusion and stenosis of bilateral carotid arteries: Secondary | ICD-10-CM | POA: Diagnosis not present

## 2023-12-11 DIAGNOSIS — Z7982 Long term (current) use of aspirin: Secondary | ICD-10-CM | POA: Diagnosis not present

## 2023-12-11 DIAGNOSIS — I708 Atherosclerosis of other arteries: Secondary | ICD-10-CM | POA: Diagnosis not present

## 2023-12-11 DIAGNOSIS — I358 Other nonrheumatic aortic valve disorders: Secondary | ICD-10-CM | POA: Diagnosis not present

## 2023-12-11 DIAGNOSIS — E785 Hyperlipidemia, unspecified: Secondary | ICD-10-CM | POA: Diagnosis not present

## 2023-12-11 DIAGNOSIS — I071 Rheumatic tricuspid insufficiency: Secondary | ICD-10-CM | POA: Diagnosis not present

## 2023-12-11 DIAGNOSIS — I2510 Atherosclerotic heart disease of native coronary artery without angina pectoris: Secondary | ICD-10-CM | POA: Diagnosis not present

## 2023-12-11 DIAGNOSIS — I340 Nonrheumatic mitral (valve) insufficiency: Secondary | ICD-10-CM | POA: Diagnosis not present

## 2023-12-11 DIAGNOSIS — I351 Nonrheumatic aortic (valve) insufficiency: Secondary | ICD-10-CM | POA: Diagnosis not present

## 2023-12-11 DIAGNOSIS — G459 Transient cerebral ischemic attack, unspecified: Secondary | ICD-10-CM | POA: Diagnosis not present

## 2023-12-11 DIAGNOSIS — I10 Essential (primary) hypertension: Secondary | ICD-10-CM | POA: Diagnosis not present

## 2023-12-11 DIAGNOSIS — I700 Atherosclerosis of aorta: Secondary | ICD-10-CM | POA: Diagnosis not present

## 2023-12-24 ENCOUNTER — Ambulatory Visit

## 2023-12-24 DIAGNOSIS — E538 Deficiency of other specified B group vitamins: Secondary | ICD-10-CM

## 2023-12-24 MED ORDER — CYANOCOBALAMIN 1000 MCG/ML IJ SOLN
1000.0000 ug | Freq: Once | INTRAMUSCULAR | Status: AC
  Administered 2023-12-24: 1000 ug via INTRAMUSCULAR

## 2023-12-30 ENCOUNTER — Encounter: Payer: Self-pay | Admitting: Ophthalmology

## 2023-12-30 DIAGNOSIS — H43813 Vitreous degeneration, bilateral: Secondary | ICD-10-CM | POA: Diagnosis not present

## 2023-12-30 DIAGNOSIS — H25012 Cortical age-related cataract, left eye: Secondary | ICD-10-CM | POA: Diagnosis not present

## 2023-12-30 DIAGNOSIS — H2512 Age-related nuclear cataract, left eye: Secondary | ICD-10-CM | POA: Diagnosis not present

## 2023-12-31 ENCOUNTER — Encounter: Payer: Self-pay | Admitting: Ophthalmology

## 2023-12-31 NOTE — Anesthesia Preprocedure Evaluation (Addendum)
 Anesthesia Evaluation  Patient identified by MRN, date of birth, ID band Patient awake    Reviewed: Allergy & Precautions, H&P , NPO status , Patient's Chart, lab work & pertinent test results  History of Anesthesia Complications (+) PONV, Family history of anesthesia reaction and history of anesthetic complications  Airway Mallampati: III  TM Distance: <3 FB Neck ROM: Full    Dental no notable dental hx. (+) Poor Dentition, Missing, Chipped Has a few lower front teeth:   Pulmonary pneumonia, former smoker   Pulmonary exam normal breath sounds clear to auscultation       Cardiovascular hypertension, + CAD and + Peripheral Vascular Disease  Normal cardiovascular exam+ dysrhythmias  Rhythm:Regular Rate:Normal  TTE 01/25/23: 1. Left ventricular ejection fraction, by estimation, is 65 to 70%. The left ventricle has normal function. The left ventricle has no regional wall motion abnormalities. Left ventricular diastolic parameters are consistent with Grade I diastolic  dysfunction (impaired relaxation).  2. Right ventricular systolic function is normal. The right ventricular size is normal.  3. The mitral valve is grossly normal. Mild mitral valve regurgitation.  4. The aortic valve is calcified. Aortic valve regurgitation is mild. Aortic valve sclerosis is present, with no evidence of aortic valve stenosis.   ECG: I personally interpreted tracing from 01/2022 - NSR  Low risk SPECT MPS 03/2020  TTE: I reviewed and personally interpreted the images from study 03/2020 INTERPRETATION NORMAL LEFT VENTRICULAR SYSTOLIC FUNCTION WITH MILD LVH NORMAL RIGHT VENTRICULAR SYSTOLIC FUNCTION MILD VALVULAR REGURGITATION (See above) NO VALVULAR STENOSIS MILD AR, MR, TR, PR EF 55-60%   Stress echo 11/2017 INTERPRETATION Normal Stress Echocardiogram NORMAL RIGHT VENTRICULAR SYSTOLIC FUNCTION MODERATE VALVULAR REGURGITATION (See above) NO  VALVULAR STENOSIS NOTED  LEFT heart catheterization was performed on 06/09/2003 revealing normal left ventricular systolic function with an EF of 63%. There was multivessel CAD; 20% proximal LCx, 20% proximal LAD, 50% mid LAD, 50% distal LAD.     Neuro/Psych  Headaches PSYCHIATRIC DISORDERS Anxiety     CTA head/neck 01/26/23: IMPRESSION:  1. Negative for large vessel occlusion. 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  2. Generally mild for age extracranial atherosclerosis (see #4).  Right > Left ICA origin and bulb involvement without hemodynamically  significant stenosis.  3. No significant ICA siphon or posterior circulation  atherosclerosis. Calcified plaque at the origin of the dominant  Right ACA A2 with up to moderate stenosis there.  4. Bulky calcified plaque at the Right Subclavian Artery origin with  estimated 75% stenosis. Less pronounced proximal left subclavian  artery plaque. Aortic Atherosclerosis (ICD10-I70.0).   TIA Neuromuscular disease CVA negative neurological ROS  negative psych ROS   GI/Hepatic negative GI ROS, Neg liver ROS, hiatal hernia,GERD  ,,  Endo/Other  negative endocrine ROS    Renal/GU Renal diseasenegative Renal ROS  negative genitourinary   Musculoskeletal negative musculoskeletal ROS (+) Arthritis ,    Abdominal   Peds negative pediatric ROS (+)  Hematology negative hematology ROS (+)   Anesthesia Other Findings Bilateral renal cysts  Prediabetes Hyperlipemia  Hypertension GERD (gastroesophageal reflux disease) Coronary artery disease Anxiety  Gastritis Tubular adenoma of colon  Redundant colon Pneumonia  History of hiatal hernia Arthritis  PONV (postoperative nausea and vomiting) Aortic atherosclerosis Hyperplastic colon polyp Diastolic dysfunction  History of 2019 novel coronavirus disease (COVID-19) Umbilical hernia  Hepatic  steatosis Hypoglycemia  PSVT (paroxysmal supraventricular tachycardia) (HCC) Family history of adverse reaction to  anesthesia--daughter had PONV Dysrhythmia Stroke [facility])  Hypoglycemia Headache  TIA (transient ischemic attack) 09/24 Generalized anxiety disorder     Reproductive/Obstetrics negative OB ROS                              Anesthesia Physical Anesthesia Plan  ASA: 3  Anesthesia Plan: MAC   Post-op Pain Management:    Induction: Intravenous  PONV Risk Score and Plan:   Airway Management Planned: Natural Airway and Nasal Cannula  Additional Equipment:   Intra-op Plan:   Post-operative Plan:   Informed Consent: I have reviewed the patients History and Physical, chart, labs and discussed the procedure including the risks, benefits and alternatives for the proposed anesthesia with the patient or authorized representative who has indicated his/her understanding and acceptance.     Dental Advisory Given  Plan Discussed with: Anesthesiologist, CRNA and Surgeon  Anesthesia Plan Comments: (Patient consented for risks of anesthesia including but not limited to:  - adverse reactions to medications - damage to eyes, teeth, lips or other oral mucosa - nerve damage due to positioning  - sore throat or hoarseness - Damage to heart, brain, nerves, lungs, other parts of body or loss of life  Patient voiced understanding and assent.)         Anesthesia Quick Evaluation

## 2024-01-01 NOTE — Discharge Instructions (Signed)

## 2024-01-05 ENCOUNTER — Encounter: Payer: Self-pay | Admitting: Ophthalmology

## 2024-01-05 ENCOUNTER — Ambulatory Visit: Payer: Self-pay | Admitting: Anesthesiology

## 2024-01-05 ENCOUNTER — Ambulatory Visit
Admission: RE | Admit: 2024-01-05 | Discharge: 2024-01-05 | Disposition: A | Discharge status: Home / Self Care | Attending: Ophthalmology | Admitting: Ophthalmology

## 2024-01-05 ENCOUNTER — Encounter
Admission: RE | Disposition: A | Discharge status: Home / Self Care | Payer: Self-pay | Source: Home / Self Care | Attending: Ophthalmology

## 2024-01-05 ENCOUNTER — Other Ambulatory Visit: Payer: Self-pay

## 2024-01-05 DIAGNOSIS — I10 Essential (primary) hypertension: Secondary | ICD-10-CM | POA: Insufficient documentation

## 2024-01-05 DIAGNOSIS — H25012 Cortical age-related cataract, left eye: Secondary | ICD-10-CM | POA: Diagnosis not present

## 2024-01-05 DIAGNOSIS — N182 Chronic kidney disease, stage 2 (mild): Secondary | ICD-10-CM | POA: Diagnosis not present

## 2024-01-05 DIAGNOSIS — K219 Gastro-esophageal reflux disease without esophagitis: Secondary | ICD-10-CM | POA: Diagnosis not present

## 2024-01-05 DIAGNOSIS — I2510 Atherosclerotic heart disease of native coronary artery without angina pectoris: Secondary | ICD-10-CM | POA: Diagnosis not present

## 2024-01-05 DIAGNOSIS — K449 Diaphragmatic hernia without obstruction or gangrene: Secondary | ICD-10-CM | POA: Diagnosis not present

## 2024-01-05 DIAGNOSIS — Z87891 Personal history of nicotine dependence: Secondary | ICD-10-CM | POA: Diagnosis not present

## 2024-01-05 DIAGNOSIS — F419 Anxiety disorder, unspecified: Secondary | ICD-10-CM | POA: Diagnosis not present

## 2024-01-05 DIAGNOSIS — I129 Hypertensive chronic kidney disease with stage 1 through stage 4 chronic kidney disease, or unspecified chronic kidney disease: Secondary | ICD-10-CM | POA: Diagnosis not present

## 2024-01-05 DIAGNOSIS — H2512 Age-related nuclear cataract, left eye: Secondary | ICD-10-CM | POA: Diagnosis not present

## 2024-01-05 HISTORY — DX: Cerebral infarction, unspecified: I63.9

## 2024-01-05 HISTORY — DX: Headache, unspecified: R51.9

## 2024-01-05 HISTORY — DX: Transient cerebral ischemic attack, unspecified: G45.9

## 2024-01-05 HISTORY — DX: Cardiac arrhythmia, unspecified: I49.9

## 2024-01-05 HISTORY — PX: CATARACT EXTRACTION W/PHACO: SHX586

## 2024-01-05 HISTORY — DX: Generalized anxiety disorder: F41.1

## 2024-01-05 HISTORY — DX: Family history of other specified conditions: Z84.89

## 2024-01-05 SURGERY — PHACOEMULSIFICATION, CATARACT, WITH IOL INSERTION
Anesthesia: Monitor Anesthesia Care | Site: Eye | Laterality: Left

## 2024-01-05 MED ORDER — ARMC OPHTHALMIC DILATING DROPS
1.0000 | OPHTHALMIC | Status: DC | PRN
  Administered 2024-01-05 (×3): 1 via OPHTHALMIC

## 2024-01-05 MED ORDER — LIDOCAINE HCL (PF) 2 % IJ SOLN
INTRAOCULAR | Status: DC | PRN
  Administered 2024-01-05: 4 mL via INTRAOCULAR

## 2024-01-05 MED ORDER — ARMC OPHTHALMIC DILATING DROPS
OPHTHALMIC | Status: AC
  Filled 2024-01-05: qty 0.5

## 2024-01-05 MED ORDER — TETRACAINE HCL 0.5 % OP SOLN
1.0000 [drp] | OPHTHALMIC | Status: DC | PRN
  Administered 2024-01-05 (×3): 1 [drp] via OPHTHALMIC

## 2024-01-05 MED ORDER — ONDANSETRON HCL 4 MG/2ML IJ SOLN
4.0000 mg | Freq: Once | INTRAMUSCULAR | Status: AC
  Administered 2024-01-05: 4 mg via INTRAVENOUS

## 2024-01-05 MED ORDER — POLYMYXIN B-TRIMETHOPRIM 10000-0.1 UNIT/ML-% OP SOLN
OPHTHALMIC | Status: DC | PRN
  Administered 2024-01-05: 1 [drp] via OPHTHALMIC

## 2024-01-05 MED ORDER — FENTANYL CITRATE (PF) 100 MCG/2ML IJ SOLN
INTRAMUSCULAR | Status: AC
  Filled 2024-01-05: qty 2

## 2024-01-05 MED ORDER — FENTANYL CITRATE (PF) 100 MCG/2ML IJ SOLN
INTRAMUSCULAR | Status: DC | PRN
  Administered 2024-01-05 (×2): 50 ug via INTRAVENOUS

## 2024-01-05 MED ORDER — ONDANSETRON HCL 4 MG/2ML IJ SOLN
INTRAMUSCULAR | Status: AC
  Filled 2024-01-05: qty 2

## 2024-01-05 MED ORDER — SIGHTPATH DOSE#1 BSS IO SOLN
INTRAOCULAR | Status: DC | PRN
  Administered 2024-01-05: 15 mL via INTRAOCULAR

## 2024-01-05 MED ORDER — TETRACAINE HCL 0.5 % OP SOLN
OPHTHALMIC | Status: AC
  Filled 2024-01-05: qty 4

## 2024-01-05 MED ORDER — SIGHTPATH DOSE#1 BSS IO SOLN
INTRAOCULAR | Status: DC | PRN
  Administered 2024-01-05: 81 mL via OPHTHALMIC

## 2024-01-05 MED ORDER — SIGHTPATH DOSE#1 NA HYALUR & NA CHOND-NA HYALUR IO KIT
PACK | INTRAOCULAR | Status: DC | PRN
  Administered 2024-01-05: 1 via OPHTHALMIC

## 2024-01-05 SURGICAL SUPPLY — 9 items
DISSECTOR HYDRO NUCLEUS 50X22 (MISCELLANEOUS) ×1 IMPLANT
FEE CATARACT SUITE SIGHTPATH (MISCELLANEOUS) ×1 IMPLANT
GLOVE PI ULTRA LF STRL 7.5 (GLOVE) ×1 IMPLANT
GLOVE SURG SYN 6.5 PF PI BL (GLOVE) ×1 IMPLANT
GLOVE SURG SYN 8.5 PF PI BL (GLOVE) ×1 IMPLANT
LENS IOL TECNIS EYHANCE 23.5 (Intraocular Lens) IMPLANT
NDL FILTER BLUNT 18X1 1/2 (NEEDLE) ×1 IMPLANT
NEEDLE FILTER BLUNT 18X1 1/2 (NEEDLE) ×1 IMPLANT
SYR 3ML LL SCALE MARK (SYRINGE) ×1 IMPLANT

## 2024-01-05 NOTE — Op Note (Signed)
 OPERATIVE NOTE  Amanda Duke [id number] 01/05/2024   PREOPERATIVE DIAGNOSIS:  Nuclear sclerotic cataract left eye.  H25.12   POSTOPERATIVE DIAGNOSIS:    Nuclear sclerotic cataract left eye.     PROCEDURE:  Phacoemusification with posterior chamber intraocular lens placement of the left eye   LENS:   Implant Name Type Inv. Item Serial No. Manufacturer Lot No. LRB No. Used Action  LENS IOL TECNIS EYHANCE 23.5 - [id number] Intraocular Lens LENS IOL TECNIS EYHANCE 23.5 [id number] [employer]  Left 1 Implanted      Procedure(s): PHACOEMULSIFICATION, CATARACT, WITH IOL INSERTION 5.38 00:41.3 (Left)  SURGEON:  Adine Novak, MD, MPH   ANESTHESIA:  Topical with tetracaine  drops augmented with 1% preservative-free intracameral lidocaine .  ESTIMATED BLOOD LOSS: <1 mL   COMPLICATIONS:  None.   DESCRIPTION OF PROCEDURE:  The patient was identified in the holding room and transported to the operating room and placed in the supine position under the operating microscope.  The left eye was identified as the operative eye and it was prepped and draped in the usual sterile ophthalmic fashion.   A 1.0 millimeter clear-corneal paracentesis was made at the 5:00 position. 0.5 ml of preservative-free 1% lidocaine  with epinephrine  was injected into the anterior chamber.  The anterior chamber was filled with viscoelastic.  A 2.4 millimeter keratome was used to make a near-clear corneal incision at the 2:00 position.  A curvilinear capsulorrhexis was made with a cystotome and capsulorrhexis forceps.  Balanced salt solution was used to hydrodissect and hydrodelineate the nucleus.   Phacoemulsification was then used in stop and chop fashion to remove the lens nucleus and epinucleus.  The remaining cortex was then removed using the irrigation and aspiration handpiece. Viscoelastic was then placed into the capsular bag to distend it for lens placement.  A lens was then injected into the capsular bag.  The  remaining viscoelastic was aspirated.   Wounds were hydrated with balanced salt solution.  The anterior chamber was inflated to a physiologic pressure with balanced salt solution.  Intracameral vigamox 0.1 mL undiltued was injected into the eye and a drop placed onto the ocular surface.  No wound leaks were noted.  The patient was taken to the recovery room in stable condition without complications of anesthesia or surgery  Adine Novak 01/05/2024, 11:47 AM

## 2024-01-05 NOTE — H&P (Signed)
 [facility]   Primary Care Physician:  Kristina Tinnie POUR, PA-C Ophthalmologist: Dr. Adine Novak  Pre-Procedure History & Physical: HPI:  Amanda Duke is a 83 y.o. female here for cataract surgery.   Past Medical History:  Diagnosis Date   Anxiety    Aortic atherosclerosis (HCC)    Arthritis    Bilateral renal cysts    Coronary artery disease 09/25/2001   a.) LHC 09/25/2001: EF 78%. 20% pLAD - med mgmt; b.) LHC 06/09/2003: EF 63%. 20% pLCx, 20% pLAD, 50% mLAD, 50% dLAD - med mgmt.   Diastolic dysfunction 12/30/2017   a.) Stress echo 12/30/2017: ED >55%, triv PR, mild AR.MR, mod TR, G1DD; b.) TTE 03/30/2020: EF >55%, mild LVH, mild panvalvular regurgitation.   Dysrhythmia    palpitations when dehydrated   Family history of adverse reaction to anesthesia    daughter had PONV   Gastritis    Generalized anxiety disorder    GERD (gastroesophageal reflux disease)    Headache    with HBP   Hepatic steatosis    History of 2019 novel coronavirus disease (COVID-19) 01/11/2022   History of hiatal hernia    a.) s/p repair 01/2022   Hyperlipemia    Hyperplastic colon polyp    Hypertension    Hypoglycemia 05/2022   this is something new-seeing pcp for this currently   Hypoglycemia    occaisionally when not eating regularly   Pneumonia    10 years ago   PONV (postoperative nausea and vomiting)    a.) PONV following orthopedic surgery (hip) in 2022   Prediabetes    PSVT (paroxysmal supraventricular tachycardia) (HCC)    Redundant colon    Stroke (HCC)    mini stroke a year ago with no effects afterwards   TIA (transient ischemic attack)    Tubular adenoma of colon    Umbilical hernia     Past Surgical History:  Procedure Laterality Date   APPENDECTOMY  1971   ARTHRODESIS  1980   BACK SURGERY  1980   CHOLECYSTECTOMY N/A    COLONOSCOPY WITH PROPOFOL  N/A 12/12/2016   Procedure: COLONOSCOPY WITH PROPOFOL ;  Surgeon: Gaylyn Gladis PENNER, MD;  Location: [facility]  ENDOSCOPY;  Service: Endoscopy;  Laterality: N/A;   COLONOSCOPY WITH PROPOFOL  N/A 07/14/2019   Procedure: COLONOSCOPY WITH PROPOFOL ;  Surgeon: Toledo, Ladell POUR, MD;  Location: [facility] ENDOSCOPY;  Service: Gastroenterology;  Laterality: N/A;   ESOPHAGOGASTRODUODENOSCOPY N/A 12/27/2021   Procedure: ESOPHAGOGASTRODUODENOSCOPY (EGD);  Surgeon: Tye Millet, DO;  Location: [facility] ENDOSCOPY;  Service: General;  Laterality: N/A;   ESOPHAGOGASTRODUODENOSCOPY (EGD) WITH PROPOFOL  N/A 12/12/2016   Procedure: ESOPHAGOGASTRODUODENOSCOPY (EGD) WITH PROPOFOL ;  Surgeon: Gaylyn Gladis PENNER, MD;  Location: [facility] ENDOSCOPY;  Service: Endoscopy;  Laterality: N/A;   ESOPHAGOGASTRODUODENOSCOPY (EGD) WITH PROPOFOL  N/A 07/14/2019   Procedure: ESOPHAGOGASTRODUODENOSCOPY (EGD) WITH PROPOFOL ;  Surgeon: Toledo, Ladell POUR, MD;  Location: [facility] ENDOSCOPY;  Service: Gastroenterology;  Laterality: N/A;   INSERTION OF MESH  02/05/2022   Procedure: INSERTION OF MESH;  Surgeon: Jordis Laneta FALCON, MD;  Location: [facility] ORS;  Service: General;;   LAPAROSCOPIC UNILATERAL SALPINGO OOPHERECTOMY  1985   right   LEFT HEART CATH AND CORONARY ANGIOGRAPHY Left 09/25/2001   Procedure: LEFT HEART CATH AND CORONARY ANGIOGRAPHY; Location: [facility]; Surgeon: Margie Lovelace, MD   LEFT HEART CATH AND CORONARY ANGIOGRAPHY Left 06/09/2003   Procedure: LEFT HEART CATH AND CORONARY ANGIOGRAPHY; Location: [facility]; Surgeon: Margie Lovelace, MD   ORIF ANKLE FRACTURE Right    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    TONSILLECTOMY  1948   TUBAL LIGATION  1971   UMBILICAL HERNIA REPAIR N/A 02/05/2022   Procedure: HERNIA REPAIR UMBILICAL ADULT;  Surgeon: Jordis Laneta FALCON, MD;  Location: [facility] ORS;  Service: General;  Laterality: N/A;   VAGINAL HYSTERECTOMY  1985   d/t heavy bleeding   XI ROBOTIC ASSISTED PARAESOPHAGEAL HERNIA REPAIR N/A 02/05/2022   Procedure: XI ROBOTIC ASSISTED PARAESOPHAGEAL HERNIA REPAIR, RNFA to assist;  Surgeon: Jordis Laneta FALCON, MD;   Location: [facility] ORS;  Service: General;  Laterality: N/A;    Prior to Admission medications   Medication Sig Start Date End Date Taking? Authorizing Provider  acetaminophen  (TYLENOL ) 500 MG tablet Take 1,000 mg by mouth every 6 (six) hours as needed for moderate pain.   Yes [provider]  aspirin  EC 81 MG tablet Take 81 mg by mouth daily. Swallow whole.   Yes [provider]  clonazePAM  (KLONOPIN ) 0.5 MG tablet TAKE 1 TABLET (0.5 MG TOTAL) BY MOUTH AS NEEDED FOR ANXIETY. TAKE 1 TABLET BY MOUTH AS NEEDED 11/11/23  Yes McDonough, Lauren K, PA-C  diltiazem  (CARDIZEM  LA) 240 MG 24 hr tablet TAKE 1 TABLET BY MOUTH EVERY DAY 11/03/23  Yes McDonough, Lauren K, PA-C  fluticasone  (FLONASE ) 50 MCG/ACT nasal spray SPRAY 1 SPRAY INTO EACH NOSTRIL EVERY DAY 11/03/23  Yes McDonough, Lauren K, PA-C  linaclotide  (LINZESS ) 290 MCG CAPS capsule Take 1 capsule (290 mcg total) by mouth daily. Patient taking differently: Take 290 mcg by mouth daily before breakfast. 09/28/18  Yes Boscia, Heather E, NP  losartan  (COZAAR ) 100 MG tablet Take 100 mg by mouth every morning.   Yes [provider]  ondansetron  (ZOFRAN -ODT) 4 MG disintegrating tablet Take 1 tablet (4 mg total) by mouth every 8 (eight) hours as needed. Patient taking differently: Take 4 mg by mouth every 8 (eight) hours as needed for nausea or vomiting. 05/30/22  Yes Claudene Rover, MD  pantoprazole  (PROTONIX ) 40 MG tablet Take 40 mg by mouth as needed. 03/09/18  Yes [provider]  rosuvastatin  (CRESTOR ) 40 MG tablet Take 40 mg by mouth at bedtime. 08/14/20  Yes [provider]  solifenacin  (VESICARE ) 5 MG tablet TAKE 1 TABLET (5 MG TOTAL) BY MOUTH DAILY. Patient taking differently: Take 5 mg by mouth as needed. 11/05/21  Yes Khan, Fozia M, MD  cholecalciferol  (VITAMIN D ) 1000 units tablet Take 1,000 Units by mouth daily. Patient not taking: Reported on 12/30/2023    [provider]  icosapent  Ethyl (VASCEPA ) 1 g  capsule Take 2 g by mouth 2 (two) times daily. Patient not taking: Reported on 12/30/2023    [provider]    Allergies as of 12/16/2023 - Review Complete 12/09/2023  Allergen Reaction Noted   Amoxicillin Hives 02/02/2015   Clindamycin Diarrhea 02/02/2015   Gemfibrozil Other (See Comments) 02/02/2015   Levofloxacin Other (See Comments) 05/19/2014   Metoprolol Other (See Comments) 02/02/2015   Simvastatin Other (See Comments) 05/19/2014   Macrobid  [nitrofurantoin  macrocrystal] Diarrhea 11/04/2020   Niacin Rash 10/27/2020   Nystatin Rash 12/28/2020   Nystatin-triamcinolone  Rash 02/02/2015   Sulfa antibiotics Rash 02/02/2015    Family History  Problem Relation Age of Onset   Heart failure Mother    Stroke Mother    Breast cancer Mother        dx at age 85 yo   Diabetes Mother    Osteoporosis Mother    Emphysema Father    Ovarian cancer Sister  dx at age 83 yo   Asthma Sister    Diabetes Daughter    Heart disease Daughter    COPD Daughter     Social History   Socioeconomic History   Marital status: Widowed    Spouse name: Not on file   Number of children: Not on file   Years of education: Not on file   Highest education level: Not on file  Occupational History   Not on file  Tobacco Use   Smoking status: Former    Current packs/day: 0.00    Average packs/day: 0.5 packs/day for 5.0 years (2.5 ttl pk-yrs)    Types: Cigarettes    Start date: 41    Quit date: 34    Years since quitting: 45.7    Passive exposure: Past   Smokeless tobacco: Never  Vaping Use   Vaping status: Never Used  Substance and Sexual Activity   Alcohol use: No    Alcohol/week: 0.0 standard drinks of alcohol   Drug use: Never   Sexual activity: Not Currently  Other Topics Concern   Not on file  Social History Narrative   Not on file   Social Drivers of Health   Financial Resource Strain: Low Risk  (11/17/2020)   Overall Financial Resource Strain (CARDIA)     Difficulty of Paying Living Expenses: Not very hard  Food Insecurity: No Food Insecurity (01/25/2023)   Hunger Vital Sign    Worried About Running Out of Food in the Last Year: Never true    Ran Out of Food in the Last Year: Never true  Transportation Needs: No Transportation Needs (01/25/2023)   PRAPARE - Administrator, Civil Service (Medical): No    Lack of Transportation (Non-Medical): No  Physical Activity: Inactive (01/10/2019)   Exercise Vital Sign    Days of Exercise per Week: 0 days    Minutes of Exercise per Session: 0 min  Stress: No Stress Concern Present (01/10/2019)   [employer] of Occupational Health - Occupational Stress Questionnaire    Feeling of Stress : Not at all  Social Connections: Socially Isolated (01/10/2019)   Social Connection and Isolation Panel    Frequency of Communication with Friends and Family: Three times a week    Frequency of Social Gatherings with Friends and Family: Three times a week    Attends Religious Services: Never    Active Member of Clubs or Organizations: No    Attends Banker Meetings: Never    Marital Status: Widowed  Intimate Partner Violence: Unknown (01/25/2023)   Humiliation, Afraid, Rape, and Kick questionnaire    Fear of Current or Ex-Partner: No    Emotionally Abused: No    Physically Abused: Not on file    Sexually Abused: No    Review of Systems: See HPI, otherwise negative ROS  Physical Exam: BP (!) 186/100   Pulse 87   Temp 97.7 F (36.5 C) (Temporal)   Resp 15   Ht 5' 5 (1.651 m)   Wt 70 kg   SpO2 98%   BMI 25.68 kg/m  General:   Alert, cooperative. Head:  Normocephalic and atraumatic. Respiratory:  Normal work of breathing. Cardiovascular:  NAD  Impression/Plan: Amanda Duke is here for cataract surgery.  Risks, benefits, limitations, and alternatives regarding cataract surgery have been reviewed with the patient.  Questions have been answered.  All parties  agreeable.   Adine Novak, MD  01/05/2024, 11:19 AM

## 2024-01-05 NOTE — Anesthesia Postprocedure Evaluation (Signed)
 Anesthesia Post Note  Patient: Amanda Duke  Procedure(s) Performed: PHACOEMULSIFICATION, CATARACT, WITH IOL INSERTION 5.38 00:41.3 (Left: Eye)  Patient location during evaluation: PACU Anesthesia Type: MAC Level of consciousness: awake and alert Pain management: pain level controlled Vital Signs Assessment: post-procedure vital signs reviewed and stable Respiratory status: spontaneous breathing, nonlabored ventilation, respiratory function stable and patient connected to nasal cannula oxygen Cardiovascular status: stable and blood pressure returned to baseline Postop Assessment: no apparent nausea or vomiting Anesthetic complications: no   No notable events documented.   Last Vitals:  Vitals:   01/05/24 1151 01/05/24 1156  BP: (!) 176/93 (!) 154/75  Pulse: 93 83  Resp: 12 12  Temp: 36.4 C 36.4 C  SpO2: 97% 96%    Last Pain:  Vitals:   01/05/24 1156  TempSrc:   PainSc: 0-No pain                 [name] C Jalaysia Lobb

## 2024-01-05 NOTE — Transfer of Care (Signed)
 Immediate Anesthesia Transfer of Care Note  Patient: Amanda Duke  Procedure(s) Performed: PHACOEMULSIFICATION, CATARACT, WITH IOL INSERTION 5.38 00:41.3 (Left: Eye)  Patient Location: PACU  Anesthesia Type: MAC  Level of Consciousness: awake, alert  and patient cooperative  Airway and Oxygen Therapy: Patient Spontanous Breathing and Patient connected to supplemental oxygen  Post-op Assessment: Post-op Vital signs reviewed, Patient's Cardiovascular Status Stable, Respiratory Function Stable, Patent Airway and No signs of Nausea or vomiting  Post-op Vital Signs: Reviewed and stable  Complications: No notable events documented.

## 2024-01-06 ENCOUNTER — Encounter: Payer: Self-pay | Admitting: Ophthalmology

## 2024-01-06 DIAGNOSIS — H2511 Age-related nuclear cataract, right eye: Secondary | ICD-10-CM | POA: Diagnosis not present

## 2024-01-08 ENCOUNTER — Encounter: Payer: Self-pay | Admitting: Physician Assistant

## 2024-01-08 ENCOUNTER — Ambulatory Visit (INDEPENDENT_AMBULATORY_CARE_PROVIDER_SITE_OTHER): Admitting: Physician Assistant

## 2024-01-08 VITALS — BP 118/78 | HR 72 | Temp 97.6°F | Resp 16 | Ht 64.0 in | Wt 157.0 lb

## 2024-01-08 DIAGNOSIS — N1831 Chronic kidney disease, stage 3a: Secondary | ICD-10-CM

## 2024-01-08 DIAGNOSIS — E559 Vitamin D deficiency, unspecified: Secondary | ICD-10-CM

## 2024-01-08 DIAGNOSIS — R7303 Prediabetes: Secondary | ICD-10-CM | POA: Diagnosis not present

## 2024-01-08 DIAGNOSIS — Z1329 Encounter for screening for other suspected endocrine disorder: Secondary | ICD-10-CM

## 2024-01-08 DIAGNOSIS — E538 Deficiency of other specified B group vitamins: Secondary | ICD-10-CM

## 2024-01-08 DIAGNOSIS — E782 Mixed hyperlipidemia: Secondary | ICD-10-CM | POA: Diagnosis not present

## 2024-01-08 DIAGNOSIS — R5383 Other fatigue: Secondary | ICD-10-CM

## 2024-01-08 DIAGNOSIS — I10 Essential (primary) hypertension: Secondary | ICD-10-CM

## 2024-01-08 NOTE — Progress Notes (Signed)
 [facility] [address] [place], [place] [address]  Internal MEDICINE  Office Visit Note  Patient Name: Amanda Duke  [id number]  [id number]  Date of Service: 01/08/2024  Chief Complaint  Patient presents with   Follow-up    Discuss continuation of B12 injections   Gastroesophageal Reflux   Hypertension   Hyperlipidemia   Quality Metric Gaps    Shingles vaccine    HPI Pt is here for routine follow up -had left cataract surgery on Monday and this went well. Will be having right on the 22nd -BP stable at home -had visit with cardiology, no changes. Follow up in 6 months -due for labs and will recheck B12  Current Medication: Outpatient Encounter Medications as of 01/08/2024  Medication Sig Note   acetaminophen  (TYLENOL ) 500 MG tablet Take 1,000 mg by mouth every 6 (six) hours as needed for moderate pain.    aspirin  EC 81 MG tablet Take 81 mg by mouth daily. Swallow whole.    cholecalciferol  (VITAMIN D ) 1000 units tablet Take 1,000 Units by mouth daily.    clonazePAM  (KLONOPIN ) 0.5 MG tablet TAKE 1 TABLET (0.5 MG TOTAL) BY MOUTH AS NEEDED FOR ANXIETY. TAKE 1 TABLET BY MOUTH AS NEEDED    diltiazem  (CARDIZEM  LA) 240 MG 24 hr tablet TAKE 1 TABLET BY MOUTH EVERY DAY    fluticasone  (FLONASE ) 50 MCG/ACT nasal spray SPRAY 1 SPRAY INTO EACH NOSTRIL EVERY DAY    icosapent  Ethyl (VASCEPA ) 1 g capsule Take 2 g by mouth 2 (two) times daily.    linaclotide  (LINZESS ) 290 MCG CAPS capsule Take 1 capsule (290 mcg total) by mouth daily. (Patient taking differently: Take 290 mcg by mouth daily before breakfast.)    losartan  (COZAAR ) 100 MG tablet Take 100 mg by mouth every morning.    ondansetron  (ZOFRAN -ODT) 4 MG disintegrating tablet Take 1 tablet (4 mg total) by mouth every 8 (eight) hours as needed. (Patient taking differently: Take 4 mg by mouth every 8 (eight) hours as needed for nausea or vomiting.)    pantoprazole  (PROTONIX ) 40 MG tablet Take 40 mg by mouth as needed.  01/25/2023: Is taking every day now instead of prn   rosuvastatin  (CRESTOR ) 40 MG tablet Take 40 mg by mouth at bedtime.    solifenacin  (VESICARE ) 5 MG tablet TAKE 1 TABLET (5 MG TOTAL) BY MOUTH DAILY. (Patient taking differently: Take 5 mg by mouth as needed.)    No facility-administered encounter medications on file as of 01/08/2024.    Surgical History: Past Surgical History:  Procedure Laterality Date   APPENDECTOMY  1971   ARTHRODESIS  1980   BACK SURGERY  1980   CATARACT EXTRACTION W/PHACO Left 01/05/2024   Procedure: PHACOEMULSIFICATION, CATARACT, WITH IOL INSERTION 5.38 00:41.3;  Surgeon: Myrna Adine Anes, MD;  Location: [facility];  Service: Ophthalmology;  Laterality: Left;   CHOLECYSTECTOMY N/A    COLONOSCOPY WITH PROPOFOL  N/A 12/12/2016   Procedure: COLONOSCOPY WITH PROPOFOL ;  Surgeon: Gaylyn Gladis PENNER, MD;  Location: [facility] ENDOSCOPY;  Service: Endoscopy;  Laterality: N/A;   COLONOSCOPY WITH PROPOFOL  N/A 07/14/2019   Procedure: COLONOSCOPY WITH PROPOFOL ;  Surgeon: Toledo, Ladell POUR, MD;  Location: [facility] ENDOSCOPY;  Service: Gastroenterology;  Laterality: N/A;   ESOPHAGOGASTRODUODENOSCOPY N/A 12/27/2021   Procedure: ESOPHAGOGASTRODUODENOSCOPY (EGD);  Surgeon: Tye Millet, DO;  Location: [facility] ENDOSCOPY;  Service: General;  Laterality: N/A;   ESOPHAGOGASTRODUODENOSCOPY (EGD) WITH PROPOFOL  N/A 12/12/2016   Procedure: ESOPHAGOGASTRODUODENOSCOPY (EGD) WITH PROPOFOL ;  Surgeon: Gaylyn Gladis PENNER, MD;  Location: [facility] ENDOSCOPY;  Service: Endoscopy;  Laterality: N/A;   ESOPHAGOGASTRODUODENOSCOPY (EGD) WITH PROPOFOL  N/A 07/14/2019   Procedure: ESOPHAGOGASTRODUODENOSCOPY (EGD) WITH PROPOFOL ;  Surgeon: Toledo, Ladell POUR, MD;  Location: [facility] ENDOSCOPY;  Service: Gastroenterology;  Laterality: N/A;   INSERTION OF MESH  02/05/2022   Procedure: INSERTION OF MESH;  Surgeon: Jordis Laneta FALCON, MD;  Location: [facility] ORS;  Service: General;;   LAPAROSCOPIC UNILATERAL SALPINGO OOPHERECTOMY   1985   right   LEFT HEART CATH AND CORONARY ANGIOGRAPHY Left 09/25/2001   Procedure: LEFT HEART CATH AND CORONARY ANGIOGRAPHY; Location: [facility]; Surgeon: Margie Lovelace, MD   LEFT HEART CATH AND CORONARY ANGIOGRAPHY Left 06/09/2003   Procedure: LEFT HEART CATH AND CORONARY ANGIOGRAPHY; Location: [facility]; Surgeon: Margie Lovelace, MD   ORIF ANKLE FRACTURE Right    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    TONSILLECTOMY  1948   TUBAL LIGATION  1971   UMBILICAL HERNIA REPAIR N/A 02/05/2022   Procedure: HERNIA REPAIR UMBILICAL ADULT;  Surgeon: Jordis Laneta FALCON, MD;  Location: [facility] ORS;  Service: General;  Laterality: N/A;   VAGINAL HYSTERECTOMY  1985   d/t heavy bleeding   XI ROBOTIC ASSISTED PARAESOPHAGEAL HERNIA REPAIR N/A 02/05/2022   Procedure: XI ROBOTIC ASSISTED PARAESOPHAGEAL HERNIA REPAIR, RNFA to assist;  Surgeon: Jordis Laneta FALCON, MD;  Location: [facility] ORS;  Service: General;  Laterality: N/A;    Medical History: Past Medical History:  Diagnosis Date   Anxiety    Aortic atherosclerosis (HCC)    Arthritis    Bilateral renal cysts    Coronary artery disease 09/25/2001   a.) LHC 09/25/2001: EF 78%. 20% pLAD - med mgmt; b.) LHC 06/09/2003: EF 63%. 20% pLCx, 20% pLAD, 50% mLAD, 50% dLAD - med mgmt.   Diastolic dysfunction 12/30/2017   a.) Stress echo 12/30/2017: ED >55%, triv PR, mild AR.MR, mod TR, G1DD; b.) TTE 03/30/2020: EF >55%, mild LVH, mild panvalvular regurgitation.   Dysrhythmia    palpitations when dehydrated   Family history of adverse reaction to anesthesia    daughter had PONV   Gastritis    Generalized anxiety disorder    GERD (gastroesophageal reflux disease)    Headache    with HBP   Hepatic steatosis    History of 2019 novel coronavirus disease (COVID-19) 01/11/2022   History of hiatal hernia    a.) s/p repair 01/2022   Hyperlipemia    Hyperplastic colon polyp    Hypertension    Hypoglycemia 05/2022   this is something new-seeing pcp for this  currently   Hypoglycemia    occaisionally when not eating regularly   Pneumonia    10 years ago   PONV (postoperative nausea and vomiting)    a.) PONV following orthopedic surgery (hip) in 2022   Prediabetes    PSVT (paroxysmal supraventricular tachycardia) (HCC)    Redundant colon    Stroke (HCC)    mini stroke a year ago with no effects afterwards   TIA (transient ischemic attack)    Tubular adenoma of colon    Umbilical hernia     Family History: Family History  Problem Relation Age of Onset   Heart failure Mother    Stroke Mother    Breast cancer Mother        dx at age 72 yo   Diabetes Mother    Osteoporosis Mother    Emphysema Father    Ovarian cancer Sister        dx at age 86 yo  Asthma Sister    Diabetes Daughter    Heart disease Daughter    COPD Daughter     Social History   Socioeconomic History   Marital status: Widowed    Spouse name: Not on file   Number of children: Not on file   Years of education: Not on file   Highest education level: Not on file  Occupational History   Not on file  Tobacco Use   Smoking status: Former    Current packs/day: 0.00    Average packs/day: 0.5 packs/day for 5.0 years (2.5 ttl pk-yrs)    Types: Cigarettes    Start date: 43    Quit date: 71    Years since quitting: 45.7    Passive exposure: Past   Smokeless tobacco: Never  Vaping Use   Vaping status: Never Used  Substance and Sexual Activity   Alcohol use: No    Alcohol/week: 0.0 standard drinks of alcohol   Drug use: Never   Sexual activity: Not Currently  Other Topics Concern   Not on file  Social History Narrative   Not on file   Social Drivers of Health   Financial Resource Strain: Low Risk  (11/17/2020)   Overall Financial Resource Strain (CARDIA)    Difficulty of Paying Living Expenses: Not very hard  Food Insecurity: No Food Insecurity (01/25/2023)   Hunger Vital Sign    Worried About Running Out of Food in the Last Year: Never true    Ran  Out of Food in the Last Year: Never true  Transportation Needs: No Transportation Needs (01/25/2023)   PRAPARE - Administrator, Civil Service (Medical): No    Lack of Transportation (Non-Medical): No  Physical Activity: Inactive (01/10/2019)   Exercise Vital Sign    Days of Exercise per Week: 0 days    Minutes of Exercise per Session: 0 min  Stress: No Stress Concern Present (01/10/2019)   [employer] of Occupational Health - Occupational Stress Questionnaire    Feeling of Stress : Not at all  Social Connections: Socially Isolated (01/10/2019)   Social Connection and Isolation Panel    Frequency of Communication with Friends and Family: Three times a week    Frequency of Social Gatherings with Friends and Family: Three times a week    Attends Religious Services: Never    Active Member of Clubs or Organizations: No    Attends Banker Meetings: Never    Marital Status: Widowed  Intimate Partner Violence: Unknown (01/25/2023)   Humiliation, Afraid, Rape, and Kick questionnaire    Fear of Current or Ex-Partner: No    Emotionally Abused: No    Physically Abused: Not on file    Sexually Abused: No      Review of Systems  Constitutional:  Positive for fatigue. Negative for chills and unexpected weight change.  HENT:  Negative for congestion, dental problem, postnasal drip, rhinorrhea, sneezing and sore throat.   Eyes:  Negative for redness.  Respiratory: Negative.  Negative for cough, chest tightness, shortness of breath and wheezing.   Cardiovascular: Negative.  Negative for chest pain and palpitations.  Gastrointestinal:  Negative for abdominal pain, constipation, diarrhea, nausea and vomiting.  Genitourinary:  Negative for dysuria and frequency.  Musculoskeletal:  Positive for arthralgias. Negative for back pain, joint swelling and neck pain.  Skin:  Negative for rash.  Neurological: Negative.  Negative for tremors and numbness.  Hematological:  Negative  for adenopathy. Does not bruise/bleed easily.  Psychiatric/Behavioral:  Negative for behavioral problems (Depression) and suicidal ideas. The patient is nervous/anxious.     Vital Signs: BP 118/78   Pulse 72   Temp 97.6 F (36.4 C)   Resp 16   Ht 5' 4 (1.626 m)   Wt 157 lb (71.2 kg)   SpO2 97%   BMI 26.95 kg/m    Physical Exam Vitals and nursing note reviewed.  Constitutional:      Appearance: Normal appearance.  HENT:     Head: Normocephalic and atraumatic.  Eyes:     Extraocular Movements: Extraocular movements intact.  Cardiovascular:     Rate and Rhythm: Normal rate and regular rhythm.  Pulmonary:     Effort: Pulmonary effort is normal.     Breath sounds: Normal breath sounds.  Skin:    General: Skin is warm and dry.  Neurological:     Mental Status: She is alert.  Psychiatric:        Mood and Affect: Mood normal.        Behavior: Behavior normal.        Assessment/Plan: 1. Benign essential HTN (Primary) Stable, continue current medication  2. Stage 3a chronic kidney disease (HCC) Will check labs for monitoring - Comprehensive metabolic panel with GFR  3. Prediabetes - Hgb A1C w/o eAG  4. Mixed hyperlipidemia Continue crestor  and will update labs - Lipid Panel With LDL/HDL Ratio  5. Vitamin D  deficiency - VITAMIN D  25 Hydroxy (Vit-D Deficiency, Fractures)  6. B12 deficiency Will hold injections and recheck labs - B12 and Folate Panel  7. Thyroid  disorder screen - TSH + free T4  8. Other fatigue - CBC w/Diff/Platelet - Comprehensive metabolic panel with GFR - TSH + free T4 - Lipid Panel With LDL/HDL Ratio - Fe+TIBC+Fer - VITAMIN D  25 Hydroxy (Vit-D Deficiency, Fractures) - B12 and Folate Panel - Hgb A1C w/o eAG   General Counseling: Tanith verbalizes understanding of the findings of todays visit and agrees with plan of treatment. I have discussed any further diagnostic evaluation that may be needed or ordered today. We also reviewed  her medications today. she has been encouraged to call the office with any questions or concerns that should arise related to todays visit.    Orders Placed This Encounter  Procedures   CBC w/Diff/Platelet   Comprehensive metabolic panel with GFR   TSH + free T4   Lipid Panel With LDL/HDL Ratio   Fe+TIBC+Fer   VITAMIN D  25 Hydroxy (Vit-D Deficiency, Fractures)   B12 and Folate Panel   Hgb A1C w/o eAG    No orders of the defined types were placed in this encounter.   This patient was seen by Tinnie Pro, PA-C in collaboration with Dr. Sigrid Bathe as a part of collaborative care agreement.   Total time spent:30 Minutes Time spent includes review of chart, medications, test results, and follow up plan with the patient.      Dr Fozia M Khan Internal medicine

## 2024-01-12 NOTE — Anesthesia Preprocedure Evaluation (Addendum)
 Anesthesia Evaluation  Patient identified by MRN, date of birth, ID band Patient awake    Reviewed: Allergy & Precautions, H&P , NPO status , Patient's Chart, lab work & pertinent test results  History of Anesthesia Complications (+) PONV, Family history of anesthesia reaction and history of anesthetic complications  Airway Mallampati: III  TM Distance: <3 FB Neck ROM: Full    Dental no notable dental hx. (+) Chipped, Poor Dentition, Missing  Poor Dentition, Missing, Chipped Has a few lower front teeth:  :   Pulmonary pneumonia, former smoker   Pulmonary exam normal breath sounds clear to auscultation       Cardiovascular hypertension, + CAD and + Peripheral Vascular Disease  Normal cardiovascular exam+ dysrhythmias  Rhythm:Regular Rate:Normal     TTE 01/25/23: 1. Left ventricular ejection fraction, by estimation, is 65 to 70%. The left ventricle has normal function. The left ventricle has no regional wall motion abnormalities. Left ventricular diastolic parameters are consistent with Grade I diastolic  dysfunction (impaired relaxation).  2. Right ventricular systolic function is normal. The right ventricular size is normal.  3. The mitral valve is grossly normal. Mild mitral valve regurgitation.  4. The aortic valve is calcified. Aortic valve regurgitation is mild. Aortic valve sclerosis is present, with no evidence of aortic valve stenosis.    ECG: I personally interpreted tracing from 01/2022 - NSR   Low risk SPECT MPS 03/2020   TTE: I reviewed and personally interpreted the images from study 03/2020 INTERPRETATION NORMAL LEFT VENTRICULAR SYSTOLIC FUNCTION WITH MILD LVH NORMAL RIGHT VENTRICULAR SYSTOLIC FUNCTION MILD VALVULAR REGURGITATION (See above) NO VALVULAR STENOSIS MILD AR, MR, TR, PR EF 55-60%    Stress echo 11/2017 INTERPRETATION Normal Stress Echocardiogram NORMAL RIGHT VENTRICULAR SYSTOLIC  FUNCTION MODERATE VALVULAR REGURGITATION (See above) NO VALVULAR STENOSIS NOTED   LEFT heart catheterization was performed on 06/09/2003 revealing normal left ventricular systolic function with an EF of 63%. There was multivessel CAD; 20% proximal LCx, 20% proximal LAD, 50% mid LAD, 50% distal LAD.       Neuro/Psych  Headaches PSYCHIATRIC DISORDERS Anxiety         CTA head/neck 01/26/23: IMPRESSION:  1. Negative for large vessel occlusion. 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  2. Generally mild for age extracranial atherosclerosis (see #4).  Right > Left ICA origin and bulb involvement without hemodynamically  significant stenosis.  3. No significant ICA siphon or posterior circulation  atherosclerosis. Calcified plaque at the origin of the dominant  Right ACA A2 with up to moderate stenosis there.  4. Bulky calcified plaque at the Right Subclavian Artery origin with  estimated 75% stenosis. Less pronounced proximal left subclavian  artery plaque. Aortic Atherosclerosis (ICD10-I70.0).     TIA Neuromuscular disease CVA negative neurological ROS  negative psych ROS   GI/Hepatic negative GI ROS, Neg liver ROS, hiatal hernia,GERD  ,,  Endo/Other  negative endocrine ROS    Renal/GU Renal diseasenegative Renal ROS  negative genitourinary   Musculoskeletal negative musculoskeletal ROS (+) Arthritis ,    Abdominal   Peds negative pediatric ROS (+)  Hematology negative hematology ROS (+)   Anesthesia Other Findings Bilateral renal cysts       Prediabetes Hyperlipemia             Hypertension GERD (gastroesophageal reflux disease) Coronary artery disease Anxiety             Gastritis Tubular adenoma of colon  Redundant colon Pneumonia             History of hiatal hernia Arthritis             PONV (postoperative nausea and vomiting) Aortic atherosclerosis Hyperplastic colon  polyp Diastolic dysfunction      History of 2019 novel coronavirus disease (COVID-19) Umbilical hernia  Hepatic steatosis Hypoglycemia             PSVT (paroxysmal supraventricular tachycardia) (HCC) Family history of adverse reaction to anesthesia--daughter had PONV Dysrhythmia Stroke [facility])  Hypoglycemia Headache             TIA (transient ischemic attack) 09/24 Generalized anxiety disorder                 Reproductive/Obstetrics negative OB ROS                              Anesthesia Physical Anesthesia Plan  ASA: 3  Anesthesia Plan: MAC   Post-op Pain Management:    Induction: Intravenous  PONV Risk Score and Plan:   Airway Management Planned: Natural Airway and Nasal Cannula  Additional Equipment:   Intra-op Plan:   Post-operative Plan:   Informed Consent: I have reviewed the patients History and Physical, chart, labs and discussed the procedure including the risks, benefits and alternatives for the proposed anesthesia with the patient or authorized representative who has indicated his/her understanding and acceptance.     Dental Advisory Given  Plan Discussed with: Anesthesiologist, CRNA and Surgeon  Anesthesia Plan Comments: (Patient consented for risks of anesthesia including but not limited to:  - adverse reactions to medications - damage to eyes, teeth, lips or other oral mucosa - nerve damage due to positioning  - sore throat or hoarseness - Damage to heart, brain, nerves, lungs, other parts of body or loss of life  Patient voiced understanding and assent.)         Anesthesia Quick Evaluation

## 2024-01-15 NOTE — Discharge Instructions (Signed)

## 2024-01-19 ENCOUNTER — Other Ambulatory Visit: Payer: Self-pay

## 2024-01-19 ENCOUNTER — Encounter
Admission: RE | Disposition: A | Discharge status: Home / Self Care | Payer: Self-pay | Source: Home / Self Care | Attending: Ophthalmology

## 2024-01-19 ENCOUNTER — Ambulatory Visit
Admission: RE | Admit: 2024-01-19 | Discharge: 2024-01-19 | Disposition: A | Discharge status: Home / Self Care | Attending: Ophthalmology | Admitting: Ophthalmology

## 2024-01-19 ENCOUNTER — Ambulatory Visit: Payer: Self-pay | Admitting: Anesthesiology

## 2024-01-19 ENCOUNTER — Encounter: Payer: Self-pay | Admitting: Ophthalmology

## 2024-01-19 DIAGNOSIS — F419 Anxiety disorder, unspecified: Secondary | ICD-10-CM | POA: Diagnosis not present

## 2024-01-19 DIAGNOSIS — Z8673 Personal history of transient ischemic attack (TIA), and cerebral infarction without residual deficits: Secondary | ICD-10-CM | POA: Diagnosis not present

## 2024-01-19 DIAGNOSIS — I129 Hypertensive chronic kidney disease with stage 1 through stage 4 chronic kidney disease, or unspecified chronic kidney disease: Secondary | ICD-10-CM | POA: Diagnosis not present

## 2024-01-19 DIAGNOSIS — K449 Diaphragmatic hernia without obstruction or gangrene: Secondary | ICD-10-CM | POA: Diagnosis not present

## 2024-01-19 DIAGNOSIS — E785 Hyperlipidemia, unspecified: Secondary | ICD-10-CM | POA: Insufficient documentation

## 2024-01-19 DIAGNOSIS — I10 Essential (primary) hypertension: Secondary | ICD-10-CM | POA: Insufficient documentation

## 2024-01-19 DIAGNOSIS — I2510 Atherosclerotic heart disease of native coronary artery without angina pectoris: Secondary | ICD-10-CM | POA: Insufficient documentation

## 2024-01-19 DIAGNOSIS — H2511 Age-related nuclear cataract, right eye: Secondary | ICD-10-CM | POA: Diagnosis not present

## 2024-01-19 DIAGNOSIS — Z79899 Other long term (current) drug therapy: Secondary | ICD-10-CM | POA: Insufficient documentation

## 2024-01-19 DIAGNOSIS — H25011 Cortical age-related cataract, right eye: Secondary | ICD-10-CM | POA: Diagnosis not present

## 2024-01-19 DIAGNOSIS — K219 Gastro-esophageal reflux disease without esophagitis: Secondary | ICD-10-CM | POA: Diagnosis not present

## 2024-01-19 DIAGNOSIS — M1990 Unspecified osteoarthritis, unspecified site: Secondary | ICD-10-CM | POA: Insufficient documentation

## 2024-01-19 DIAGNOSIS — I739 Peripheral vascular disease, unspecified: Secondary | ICD-10-CM | POA: Insufficient documentation

## 2024-01-19 DIAGNOSIS — N182 Chronic kidney disease, stage 2 (mild): Secondary | ICD-10-CM | POA: Diagnosis not present

## 2024-01-19 DIAGNOSIS — Z7982 Long term (current) use of aspirin: Secondary | ICD-10-CM | POA: Diagnosis not present

## 2024-01-19 DIAGNOSIS — R519 Headache, unspecified: Secondary | ICD-10-CM | POA: Diagnosis not present

## 2024-01-19 DIAGNOSIS — Z87891 Personal history of nicotine dependence: Secondary | ICD-10-CM | POA: Insufficient documentation

## 2024-01-19 HISTORY — PX: CATARACT EXTRACTION W/PHACO: SHX586

## 2024-01-19 SURGERY — PHACOEMULSIFICATION, CATARACT, WITH IOL INSERTION
Anesthesia: Monitor Anesthesia Care | Site: Eye | Laterality: Right

## 2024-01-19 MED ORDER — ONDANSETRON HCL 4 MG/2ML IJ SOLN
INTRAMUSCULAR | Status: AC
  Filled 2024-01-19: qty 2

## 2024-01-19 MED ORDER — ONDANSETRON HCL 4 MG/2ML IJ SOLN
INTRAMUSCULAR | Status: DC | PRN
  Administered 2024-01-19: 4 mg via INTRAVENOUS

## 2024-01-19 MED ORDER — SIGHTPATH DOSE#1 BSS IO SOLN
INTRAOCULAR | Status: DC | PRN
  Administered 2024-01-19: 78 mL via OPHTHALMIC

## 2024-01-19 MED ORDER — FENTANYL CITRATE (PF) 100 MCG/2ML IJ SOLN
INTRAMUSCULAR | Status: AC
  Filled 2024-01-19: qty 2

## 2024-01-19 MED ORDER — MOXIFLOXACIN HCL 0.5 % OP SOLN
OPHTHALMIC | Status: DC | PRN
  Administered 2024-01-19: .2 mL via OPHTHALMIC

## 2024-01-19 MED ORDER — TETRACAINE HCL 0.5 % OP SOLN
1.0000 [drp] | OPHTHALMIC | Status: DC | PRN
  Administered 2024-01-19 (×3): 1 [drp] via OPHTHALMIC

## 2024-01-19 MED ORDER — SIGHTPATH DOSE#1 NA HYALUR & NA CHOND-NA HYALUR IO KIT
PACK | INTRAOCULAR | Status: DC | PRN
  Administered 2024-01-19: 1 via OPHTHALMIC

## 2024-01-19 MED ORDER — LIDOCAINE HCL (PF) 2 % IJ SOLN
INTRAOCULAR | Status: DC | PRN
  Administered 2024-01-19: 4 mL via INTRAOCULAR

## 2024-01-19 MED ORDER — ARMC OPHTHALMIC DILATING DROPS
1.0000 | OPHTHALMIC | Status: DC | PRN
  Administered 2024-01-19 (×3): 1 via OPHTHALMIC

## 2024-01-19 MED ORDER — FENTANYL CITRATE (PF) 100 MCG/2ML IJ SOLN
INTRAMUSCULAR | Status: DC | PRN
  Administered 2024-01-19 (×2): 50 ug via INTRAVENOUS

## 2024-01-19 MED ORDER — TETRACAINE HCL 0.5 % OP SOLN
OPHTHALMIC | Status: AC
  Filled 2024-01-19: qty 4

## 2024-01-19 MED ORDER — ARMC OPHTHALMIC DILATING DROPS
OPHTHALMIC | Status: AC
  Filled 2024-01-19: qty 0.5

## 2024-01-19 MED ORDER — SIGHTPATH DOSE#1 BSS IO SOLN
INTRAOCULAR | Status: DC | PRN
  Administered 2024-01-19: 15 mL via INTRAOCULAR

## 2024-01-19 MED ORDER — LACTATED RINGERS IV SOLN: INTRAVENOUS | Status: DC

## 2024-01-19 SURGICAL SUPPLY — 9 items
DISSECTOR HYDRO NUCLEUS 50X22 (MISCELLANEOUS) ×1 IMPLANT
FEE CATARACT SUITE SIGHTPATH (MISCELLANEOUS) ×1 IMPLANT
GLOVE PI ULTRA LF STRL 7.5 (GLOVE) ×1 IMPLANT
GLOVE SURG SYN 6.5 PF PI BL (GLOVE) ×1 IMPLANT
GLOVE SURG SYN 8.5 PF PI BL (GLOVE) ×1 IMPLANT
LENS IOL TECNIS EYHANCE 23.5 (Intraocular Lens) IMPLANT
NDL FILTER BLUNT 18X1 1/2 (NEEDLE) ×1 IMPLANT
NEEDLE FILTER BLUNT 18X1 1/2 (NEEDLE) ×1 IMPLANT
SYR 3ML LL SCALE MARK (SYRINGE) ×1 IMPLANT

## 2024-01-19 NOTE — Op Note (Signed)
 OPERATIVE NOTE  Amanda Duke [id number] 01/19/2024   PREOPERATIVE DIAGNOSIS:  Nuclear sclerotic cataract right eye.  H25.11   POSTOPERATIVE DIAGNOSIS:    Nuclear sclerotic cataract right eye.     PROCEDURE:  Phacoemusification with posterior chamber intraocular lens placement of the right eye   LENS:   Implant Name Type Inv. Item Serial No. Manufacturer Lot No. LRB No. Used Action  LENS IOL TECNIS EYHANCE 23.5 - [id number] Intraocular Lens LENS IOL TECNIS EYHANCE 23.5 [id number] [employer]  Right 1 Implanted       Procedure(s): PHACOEMULSIFICATION, CATARACT, WITH IOL INSERTION 3.67 00:31.9 (Right)  SURGEON:  Adine Novak, MD, MPH  ANESTHESIOLOGIST: Anesthesiologist: Ola Donny BROCKS, MD CRNA: Jahoo, Sonia, CRNA   ANESTHESIA:  Topical with tetracaine  drops augmented with 1% preservative-free intracameral lidocaine .  ESTIMATED BLOOD LOSS: less than 1 mL.   COMPLICATIONS:  None.   DESCRIPTION OF PROCEDURE:  The patient was identified in the holding room and transported to the operating room and placed in the supine position under the operating microscope.  The right eye was identified as the operative eye and it was prepped and draped in the usual sterile ophthalmic fashion.   A 1.0 millimeter clear-corneal paracentesis was made at the 10:30 position. 0.5 ml of preservative-free 1% lidocaine  with epinephrine  was injected into the anterior chamber.  The anterior chamber was filled with viscoelastic.  A 2.4 millimeter keratome was used to make a near-clear corneal incision at the 8:00 position.  A curvilinear capsulorrhexis was made with a cystotome and capsulorrhexis forceps.  Balanced salt solution was used to hydrodissect and hydrodelineate the nucleus.   Phacoemulsification was then used in stop and chop fashion to remove the lens nucleus and epinucleus.  The remaining cortex was then removed using the irrigation and aspiration handpiece. Viscoelastic was then placed into the  capsular bag to distend it for lens placement.  A lens was then injected into the capsular bag.  The remaining viscoelastic was aspirated.   Wounds were hydrated with balanced salt solution.  The anterior chamber was inflated to a physiologic pressure with balanced salt solution.   Intracameral vigamox  0.1 mL undiluted was injected into the eye and a drop placed onto the ocular surface.  No wound leaks were noted.  The patient was taken to the recovery room in stable condition without complications of anesthesia or surgery  Adine Novak 01/19/2024, 10:10 AM

## 2024-01-19 NOTE — H&P (Signed)
 [facility]   Primary Care Physician:  Kristina Tinnie POUR, PA-C Ophthalmologist: Dr. Adine Novak  Pre-Procedure History & Physical: HPI:  Amanda Duke is a 83 y.o. female here for cataract surgery.   Past Medical History:  Diagnosis Date   Anxiety    Aortic atherosclerosis (HCC)    Arthritis    Bilateral renal cysts    Coronary artery disease 09/25/2001   a.) LHC 09/25/2001: EF 78%. 20% pLAD - med mgmt; b.) LHC 06/09/2003: EF 63%. 20% pLCx, 20% pLAD, 50% mLAD, 50% dLAD - med mgmt.   Diastolic dysfunction 12/30/2017   a.) Stress echo 12/30/2017: ED >55%, triv PR, mild AR.MR, mod TR, G1DD; b.) TTE 03/30/2020: EF >55%, mild LVH, mild panvalvular regurgitation.   Dysrhythmia    palpitations when dehydrated   Family history of adverse reaction to anesthesia    daughter had PONV   Gastritis    Generalized anxiety disorder    GERD (gastroesophageal reflux disease)    Headache    with HBP   Hepatic steatosis    History of 2019 novel coronavirus disease (COVID-19) 01/11/2022   History of hiatal hernia    a.) s/p repair 01/2022   Hyperlipemia    Hyperplastic colon polyp    Hypertension    Hypoglycemia 05/2022   this is something new-seeing pcp for this currently   Hypoglycemia    occaisionally when not eating regularly   Pneumonia    10 years ago   PONV (postoperative nausea and vomiting)    a.) PONV following orthopedic surgery (hip) in 2022   Prediabetes    PSVT (paroxysmal supraventricular tachycardia) (HCC)    Redundant colon    Stroke (HCC)    mini stroke a year ago with no effects afterwards   TIA (transient ischemic attack)    Tubular adenoma of colon    Umbilical hernia     Past Surgical History:  Procedure Laterality Date   APPENDECTOMY  1971   ARTHRODESIS  1980   BACK SURGERY  1980   CATARACT EXTRACTION W/PHACO Left 01/05/2024   Procedure: PHACOEMULSIFICATION, CATARACT, WITH IOL INSERTION 5.38 00:41.3;  Surgeon: Novak Adine Anes, MD;  Location:  [facility];  Service: Ophthalmology;  Laterality: Left;   CHOLECYSTECTOMY N/A    COLONOSCOPY WITH PROPOFOL  N/A 12/12/2016   Procedure: COLONOSCOPY WITH PROPOFOL ;  Surgeon: Gaylyn Gladis PENNER, MD;  Location: [facility] END[facility]COPY;  Service: Endoscopy;  Laterality: N/A;   COLONOSCOPY WITH PROPOFOL  N/A 07/14/2019   Procedure: COLONOSCOPY WITH PROPOFOL ;  Surgeon: Toledo, Ladell POUR, MD;  Location: [facility] ENDOSCOPY;  Service: Gastroenterology;  Laterality: N/A;   ESOPHAGOGASTRODUODENOSCOPY N/A 12/27/2021   Procedure: ESOPHAGOGASTRODUODENOSCOPY (EGD);  Surgeon: Tye Millet, DO;  Location: [facility] ENDOSCOPY;  Service: General;  Laterality: N/A;   ESOPHAGOGASTRODUODENOSCOPY (EGD) WITH PROPOFOL  N/A 12/12/2016   Procedure: ESOPHAGOGASTRODUODENOSCOPY (EGD) WITH PROPOFOL ;  Surgeon: Gaylyn Gladis PENNER, MD;  Location: [facility] ENDOSCOPY;  Service: Endoscopy;  Laterality: N/A;   ESOPHAGOGASTRODUODENOSCOPY (EGD) WITH PROPOFOL  N/A 07/14/2019   Procedure: ESOPHAGOGASTRODUODENOSCOPY (EGD) WITH PROPOFOL ;  Surgeon: Toledo, Ladell POUR, MD;  Location: [facility] ENDOSCOPY;  Service: Gastroenterology;  Laterality: N/A;   INSERTION OF MESH  02/05/2022   Procedure: INSERTION OF MESH;  Surgeon: Jordis Laneta FALCON, MD;  Location: [facility] ORS;  Service: General;;   LAPAROSCOPIC UNILATERAL SALPINGO OOPHERECTOMY  1985   right   LEFT HEART CATH AND CORONARY ANGIOGRAPHY Left 09/25/2001   Procedure: LEFT HEART CATH AND CORONARY ANGIOGRAPHY; Location: [facility]; Surgeon: Margie Lovelace, MD   LEFT HEART  CATH AND CORONARY ANGIOGRAPHY Left 06/09/2003   Procedure: LEFT HEART CATH AND CORONARY ANGIOGRAPHY; Location: [facility]; Surgeon: Margie Lovelace, MD   ORIF ANKLE FRACTURE Right    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    TONSILLECTOMY  1948   TUBAL LIGATION  1971   UMBILICAL HERNIA REPAIR N/A 02/05/2022   Procedure: HERNIA REPAIR UMBILICAL ADULT;  Surgeon: Jordis Laneta FALCON, MD;  Location: [facility] ORS;  Service: General;  Laterality: N/A;    VAGINAL HYSTERECTOMY  1985   d/t heavy bleeding   XI ROBOTIC ASSISTED PARAESOPHAGEAL HERNIA REPAIR N/A 02/05/2022   Procedure: XI ROBOTIC ASSISTED PARAESOPHAGEAL HERNIA REPAIR, RNFA to assist;  Surgeon: Jordis Laneta FALCON, MD;  Location: [facility] ORS;  Service: General;  Laterality: N/A;    Prior to Admission medications   Medication Sig Start Date End Date Taking? Authorizing Provider  acetaminophen  (TYLENOL ) 500 MG tablet Take 1,000 mg by mouth every 6 (six) hours as needed for moderate pain.   Yes [provider]  aspirin  EC 81 MG tablet Take 81 mg by mouth daily. Swallow whole.   Yes [provider]  cholecalciferol  (VITAMIN D ) 1000 units tablet Take 1,000 Units by mouth daily.   Yes [provider]  clonazePAM  (KLONOPIN ) 0.5 MG tablet TAKE 1 TABLET (0.5 MG TOTAL) BY MOUTH AS NEEDED FOR ANXIETY. TAKE 1 TABLET BY MOUTH AS NEEDED 11/11/23  Yes McDonough, Lauren K, PA-C  diltiazem  (CARDIZEM  LA) 240 MG 24 hr tablet TAKE 1 TABLET BY MOUTH EVERY DAY 11/03/23  Yes McDonough, Lauren K, PA-C  fluticasone  (FLONASE ) 50 MCG/ACT nasal spray SPRAY 1 SPRAY INTO EACH NOSTRIL EVERY DAY 11/03/23  Yes McDonough, Lauren K, PA-C  icosapent  Ethyl (VASCEPA ) 1 g capsule Take 2 g by mouth 2 (two) times daily.   Yes [provider]  linaclotide  (LINZESS ) 290 MCG CAPS capsule Take 1 capsule (290 mcg total) by mouth daily. Patient taking differently: Take 290 mcg by mouth daily before breakfast. 09/28/18  Yes Boscia, Heather E, NP  losartan  (COZAAR ) 100 MG tablet Take 100 mg by mouth every morning.   Yes [provider]  pantoprazole  (PROTONIX ) 40 MG tablet Take 40 mg by mouth as needed. 03/09/18  Yes [provider]  rosuvastatin  (CRESTOR ) 40 MG tablet Take 40 mg by mouth at bedtime. 08/14/20  Yes [provider]  ondansetron  (ZOFRAN -ODT) 4 MG disintegrating tablet Take 1 tablet (4 mg total) by mouth every 8 (eight) hours as needed. Patient taking differently: Take 4 mg  by mouth every 8 (eight) hours as needed for nausea or vomiting. 05/30/22   Claudene Rover, MD  solifenacin  (VESICARE ) 5 MG tablet TAKE 1 TABLET (5 MG TOTAL) BY MOUTH DAILY. Patient taking differently: Take 5 mg by mouth as needed. 11/05/21   Khan, Fozia M, MD    Allergies as of 12/16/2023 - Review Complete 12/09/2023  Allergen Reaction Noted   Amoxicillin Hives 02/02/2015   Clindamycin Diarrhea 02/02/2015   Gemfibrozil Other (See Comments) 02/02/2015   Levofloxacin Other (See Comments) 05/19/2014   Metoprolol Other (See Comments) 02/02/2015   Simvastatin Other (See Comments) 05/19/2014   Macrobid  [nitrofurantoin  macrocrystal] Diarrhea 11/04/2020   Niacin Rash 10/27/2020   Nystatin Rash 12/28/2020   Nystatin-triamcinolone  Rash 02/02/2015   Sulfa antibiotics Rash 02/02/2015    Family History  Problem Relation Age of Onset   Heart failure Mother    Stroke Mother    Breast cancer Mother        dx at age 75  yo   Diabetes Mother    Osteoporosis Mother    Emphysema Father    Ovarian cancer Sister        dx at age 41 yo   Asthma Sister    Diabetes Daughter    Heart disease Daughter    COPD Daughter     Social History   Socioeconomic History   Marital status: Widowed    Spouse name: Not on file   Number of children: Not on file   Years of education: Not on file   Highest education level: Not on file  Occupational History   Not on file  Tobacco Use   Smoking status: Former    Current packs/day: 0.00    Average packs/day: 0.5 packs/day for 5.0 years (2.5 ttl pk-yrs)    Types: Cigarettes    Start date: 56    Quit date: 23    Years since quitting: 45.7    Passive exposure: Past   Smokeless tobacco: Never  Vaping Use   Vaping status: Never Used  Substance and Sexual Activity   Alcohol use: No    Alcohol/week: 0.0 standard drinks of alcohol   Drug use: Never   Sexual activity: Not Currently  Other Topics Concern   Not on file  Social History Narrative   Not on file    Social Drivers of Health   Financial Resource Strain: Low Risk  (11/17/2020)   Overall Financial Resource Strain (CARDIA)    Difficulty of Paying Living Expenses: Not very hard  Food Insecurity: No Food Insecurity (01/25/2023)   Hunger Vital Sign    Worried About Running Out of Food in the Last Year: Never true    Ran Out of Food in the Last Year: Never true  Transportation Needs: No Transportation Needs (01/25/2023)   PRAPARE - Administrator, Civil Service (Medical): No    Lack of Transportation (Non-Medical): No  Physical Activity: Inactive (01/10/2019)   Exercise Vital Sign    Days of Exercise per Week: 0 days    Minutes of Exercise per Session: 0 min  Stress: No Stress Concern Present (01/10/2019)   [employer] of Occupational Health - Occupational Stress Questionnaire    Feeling of Stress : Not at all  Social Connections: Socially Isolated (01/10/2019)   Social Connection and Isolation Panel    Frequency of Communication with Friends and Family: Three times a week    Frequency of Social Gatherings with Friends and Family: Three times a week    Attends Religious Services: Never    Active Member of Clubs or Organizations: No    Attends Banker Meetings: Never    Marital Status: Widowed  Intimate Partner Violence: Unknown (01/25/2023)   Humiliation, Afraid, Rape, and Kick questionnaire    Fear of Current or Ex-Partner: No    Emotionally Abused: No    Physically Abused: Not on file    Sexually Abused: No    Review of Systems: See HPI, otherwise negative ROS  Physical Exam: BP (!) 190/88   Temp (!) 97 F (36.1 C) (Axillary)   Resp 14   Ht 5' 4.02 (1.626 m)   Wt 73.8 kg   SpO2 98%   BMI 27.93 kg/m  General:   Alert, cooperative. Head:  Normocephalic and atraumatic. Respiratory:  Normal work of breathing. Cardiovascular:  NAD  Impression/Plan: Amanda Duke is here for cataract surgery.  Risks, benefits, limitations, and  alternatives regarding cataract surgery have been reviewed with the  patient.  Questions have been answered.  All parties agreeable.   Adine Novak, MD  01/19/2024, 9:46 AM

## 2024-01-19 NOTE — Transfer of Care (Signed)
 Immediate Anesthesia Transfer of Care Note  Patient: Amanda Duke  Procedure(s) Performed: PHACOEMULSIFICATION, CATARACT, WITH IOL INSERTION 3.67 00:31.9 (Right: Eye)  Patient Location: PACU  Anesthesia Type: MAC  Level of Consciousness: awake, alert  and patient cooperative  Airway and Oxygen Therapy: Patient Spontanous Breathing and Patient connected to supplemental oxygen  Post-op Assessment: Post-op Vital signs reviewed, Patient's Cardiovascular Status Stable, Respiratory Function Stable, Patent Airway and No signs of Nausea or vomiting  Post-op Vital Signs: Reviewed and stable  Complications: No notable events documented.

## 2024-01-19 NOTE — Anesthesia Postprocedure Evaluation (Signed)
 Anesthesia Post Note  Patient: Amanda Duke  Procedure(s) Performed: PHACOEMULSIFICATION, CATARACT, WITH IOL INSERTION 3.67 00:31.9 (Right: Eye)  Patient location during evaluation: PACU Anesthesia Type: MAC Level of consciousness: awake and alert Pain management: pain level controlled Vital Signs Assessment: post-procedure vital signs reviewed and stable Respiratory status: spontaneous breathing, nonlabored ventilation, respiratory function stable and patient connected to nasal cannula oxygen Cardiovascular status: stable and blood pressure returned to baseline Postop Assessment: no apparent nausea or vomiting Anesthetic complications: no   No notable events documented.   Last Vitals:  Vitals:   01/19/24 0914 01/19/24 1012  BP: (!) 190/88 (!) 165/90  Pulse:  83  Resp: 14 14  Temp: (!) 36.1 C (!) 36.3 C  SpO2: 98% 98%    Last Pain:  Vitals:   01/19/24 1012  TempSrc:   PainSc: 0-No pain                 Prentice Murphy

## 2024-01-20 ENCOUNTER — Encounter: Payer: Self-pay | Admitting: Ophthalmology

## 2024-01-24 ENCOUNTER — Other Ambulatory Visit: Payer: Self-pay | Admitting: Physician Assistant

## 2024-01-28 ENCOUNTER — Other Ambulatory Visit: Payer: Self-pay | Admitting: Physician Assistant

## 2024-01-28 DIAGNOSIS — F411 Generalized anxiety disorder: Secondary | ICD-10-CM

## 2024-01-30 DIAGNOSIS — Z1231 Encounter for screening mammogram for malignant neoplasm of breast: Secondary | ICD-10-CM | POA: Diagnosis not present

## 2024-02-16 ENCOUNTER — Other Ambulatory Visit
Admission: RE | Admit: 2024-02-16 | Discharge: 2024-02-16 | Disposition: A | Discharge status: Home / Self Care | Attending: Physician Assistant | Admitting: Physician Assistant

## 2024-02-16 DIAGNOSIS — N1831 Chronic kidney disease, stage 3a: Secondary | ICD-10-CM | POA: Insufficient documentation

## 2024-02-16 DIAGNOSIS — E538 Deficiency of other specified B group vitamins: Secondary | ICD-10-CM | POA: Diagnosis not present

## 2024-02-16 DIAGNOSIS — Z1329 Encounter for screening for other suspected endocrine disorder: Secondary | ICD-10-CM | POA: Diagnosis not present

## 2024-02-16 DIAGNOSIS — E782 Mixed hyperlipidemia: Secondary | ICD-10-CM | POA: Insufficient documentation

## 2024-02-16 DIAGNOSIS — R5383 Other fatigue: Secondary | ICD-10-CM | POA: Insufficient documentation

## 2024-02-16 DIAGNOSIS — E559 Vitamin D deficiency, unspecified: Secondary | ICD-10-CM | POA: Diagnosis not present

## 2024-02-16 DIAGNOSIS — R7303 Prediabetes: Secondary | ICD-10-CM | POA: Diagnosis not present

## 2024-02-16 LAB — VITAMIN D 25 HYDROXY (VIT D DEFICIENCY, FRACTURES): Vit D, 25-Hydroxy: 27.26 ng/mL — ABNORMAL LOW (ref 30–100)

## 2024-02-16 LAB — CBC WITH DIFFERENTIAL/PLATELET
Abs Immature Granulocytes: 0.01 K/uL (ref 0.00–0.07)
Basophils Absolute: 0.1 K/uL (ref 0.0–0.1)
Basophils Relative: 1 %
Eosinophils Absolute: 0.1 K/uL (ref 0.0–0.5)
Eosinophils Relative: 2 %
HCT: 40.3 % (ref 36.0–46.0)
Hemoglobin: 12.9 g/dL (ref 12.0–15.0)
Immature Granulocytes: 0 %
Lymphocytes Relative: 33 %
Lymphs Abs: 1.7 K/uL (ref 0.7–4.0)
MCH: 28.2 pg (ref 26.0–34.0)
MCHC: 32 g/dL (ref 30.0–36.0)
MCV: 88.2 fL (ref 80.0–100.0)
Monocytes Absolute: 0.6 K/uL (ref 0.1–1.0)
Monocytes Relative: 11 %
Neutro Abs: 2.7 K/uL (ref 1.7–7.7)
Neutrophils Relative %: 53 %
Platelets: 254 K/uL (ref 150–400)
RBC: 4.57 MIL/uL (ref 3.87–5.11)
RDW: 14.6 % (ref 11.5–15.5)
WBC: 5.2 K/uL (ref 4.0–10.5)
nRBC: 0 % (ref 0.0–0.2)

## 2024-02-16 LAB — FERRITIN: Ferritin: 21 ng/mL (ref 11–307)

## 2024-02-16 LAB — COMPREHENSIVE METABOLIC PANEL WITH GFR
ALT: 9 U/L (ref 0–44)
AST: 20 U/L (ref 15–41)
Albumin: 3.8 g/dL (ref 3.5–5.0)
Alkaline Phosphatase: 77 U/L (ref 38–126)
Anion gap: 9 (ref 5–15)
BUN: 15 mg/dL (ref 8–23)
CO2: 26 mmol/L (ref 22–32)
Calcium: 8.9 mg/dL (ref 8.9–10.3)
Chloride: 105 mmol/L (ref 98–111)
Creatinine, Ser: 1.19 mg/dL — ABNORMAL HIGH (ref 0.44–1.00)
GFR, Estimated: 45 mL/min — ABNORMAL LOW (ref >60)
Glucose, Bld: 98 mg/dL (ref 70–99)
Potassium: 4.1 mmol/L (ref 3.5–5.1)
Sodium: 140 mmol/L (ref 135–145)
Total Bilirubin: 0.4 mg/dL (ref 0.0–1.2)
Total Protein: 7.1 g/dL (ref 6.5–8.1)

## 2024-02-16 LAB — LIPID PANEL
Cholesterol: 167 mg/dL (ref 0–200)
HDL: 76 mg/dL (ref >40)
LDL Cholesterol: 74 mg/dL (ref 0–99)
Total CHOL/HDL Ratio: 2.2 ratio
Triglycerides: 83 mg/dL (ref <150)
VLDL: 17 mg/dL (ref 0–40)

## 2024-02-16 LAB — T4, FREE: Free T4: 0.88 ng/dL (ref 0.61–1.12)

## 2024-02-16 LAB — TSH: TSH: 1.443 u[IU]/mL (ref 0.350–4.500)

## 2024-02-16 LAB — HEMOGLOBIN A1C
Hgb A1c MFr Bld: 5.3 % (ref 4.8–5.6)
Mean Plasma Glucose: 105.41 mg/dL

## 2024-02-16 LAB — IRON AND TIBC
Iron: 67 ug/dL (ref 28–170)
Saturation Ratios: 15 % (ref 10.4–31.8)
TIBC: 462 ug/dL — ABNORMAL HIGH (ref 250–450)
UIBC: 395 ug/dL

## 2024-02-16 LAB — VITAMIN B12: Vitamin B-12: 345 pg/mL (ref 180–914)

## 2024-02-16 LAB — FOLATE: Folate: >20 ng/mL (ref >5.9)

## 2024-02-17 ENCOUNTER — Ambulatory Visit: Payer: Self-pay | Admitting: Physician Assistant

## 2024-02-17 NOTE — Telephone Encounter (Signed)
LVM for patient regarding labs 

## 2024-02-17 NOTE — Telephone Encounter (Signed)
-----   Message from Tinnie MARLA Pro sent at 02/17/2024  1:54 PM EDT ----- Please let her know know that her Vit D is still low, but improved and continue to supplement. Kidney function did decline a little and we will monitor as this has fluctuated previously. B12 improved  but still on low end of normal and may supplement. A1c normal. ----- Message ----- From: Interface, Lab In [place] Sent: 02/16/2024  10:43 AM EDT To: Tinnie MARLA Pro, PA-C

## 2024-02-18 ENCOUNTER — Telehealth: Payer: Self-pay

## 2024-02-18 NOTE — Telephone Encounter (Signed)
 Patient notified of labs results.

## 2024-03-10 ENCOUNTER — Encounter: Payer: Self-pay | Admitting: Nurse Practitioner

## 2024-03-10 ENCOUNTER — Ambulatory Visit (INDEPENDENT_AMBULATORY_CARE_PROVIDER_SITE_OTHER): Admitting: Nurse Practitioner

## 2024-03-10 VITALS — BP 163/82 | HR 73 | Temp 95.7°F | Resp 16 | Ht 64.0 in | Wt 157.8 lb

## 2024-03-10 DIAGNOSIS — N390 Urinary tract infection, site not specified: Secondary | ICD-10-CM

## 2024-03-10 DIAGNOSIS — R300 Dysuria: Secondary | ICD-10-CM

## 2024-03-10 LAB — POCT URINALYSIS DIPSTICK
Bilirubin, UA: NEGATIVE
Blood, UA: NEGATIVE
Glucose, UA: NEGATIVE
Nitrite, UA: POSITIVE
Protein, UA: NEGATIVE
Spec Grav, UA: <=1.005 — AB (ref 1.010–1.025)
Urobilinogen, UA: 0.2 U/dL
pH, UA: 6.5 (ref 5.0–8.0)

## 2024-03-10 MED ORDER — PHENAZOPYRIDINE HCL 200 MG PO TABS
200.0000 mg | ORAL_TABLET | Freq: Three times a day (TID) | ORAL | 0 refills | Status: DC | PRN
Start: 2024-03-10 — End: 2024-03-19

## 2024-03-10 MED ORDER — CIPROFLOXACIN HCL 500 MG PO TABS: 500.0000 mg | ORAL_TABLET | Freq: Two times a day (BID) | ORAL | 0 refills | Status: AC

## 2024-03-10 NOTE — Progress Notes (Signed)
 [facility] [address] [place], [place] [address]  Internal MEDICINE  Office Visit Note  Patient Name: Amanda Duke  [id number]  [id number]  Date of Service: 03/10/2024  Chief Complaint  Patient presents with   Acute Visit    uti     HPI Amanda Duke presents for an acute sick visit for possible UTI --onset of symptoms was 3 days ago -- reports burning with urination, urgency, frequency, back pain suprapubic pressure and tenderness.  --Urinalysis was positive for nitrites and moderate leukocytes. 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 Allergic to bactrim and nitrofurantoin . Previously treated with keflex  and the symptoms improve but then come back. She had a UTI in January, then again in June, then August and now in November.    Current Medication:  Outpatient Encounter Medications as of 03/10/2024  Medication Sig Note   ciprofloxacin  (CIPRO ) 500 MG tablet Take 1 tablet (500 mg total) by mouth 2 (two) times daily for 5 days. Take with food    phenazopyridine  (PYRIDIUM ) 200 MG tablet Take 1 tablet (200 mg total) by mouth 3 (three) times daily as needed for pain.    acetaminophen  (TYLENOL ) 500 MG tablet Take 1,000 mg by mouth every 6 (six) hours as needed for moderate pain.    aspirin  EC 81 MG tablet Take 81 mg by mouth daily. Swallow whole.    cholecalciferol  (VITAMIN D ) 1000 units tablet Take 1,000 Units by mouth daily.    clonazePAM  (KLONOPIN ) 0.5 MG tablet TAKE 1 TABLET (0.5 MG TOTAL) BY MOUTH AS NEEDED FOR ANXIETY. TAKE 1 TABLET BY MOUTH AS NEEDED    diltiazem  (CARDIZEM  LA) 240 MG 24 hr tablet TAKE 1 TABLET BY MOUTH EVERY DAY    fluticasone  (FLONASE ) 50 MCG/ACT nasal spray SPRAY 1 SPRAY INTO EACH NOSTRIL EVERY DAY    icosapent  Ethyl (VASCEPA ) 1 g capsule Take 2 g by mouth 2 (two) times daily.    linaclotide  (LINZESS ) 290 MCG CAPS capsule Take 1 capsule (290 mcg total) by mouth daily. (Patient taking differently: Take 290 mcg by mouth  daily before breakfast.)    losartan  (COZAAR ) 100 MG tablet Take 100 mg by mouth every morning.    ondansetron  (ZOFRAN -ODT) 4 MG disintegrating tablet Take 1 tablet (4 mg total) by mouth every 8 (eight) hours as needed. (Patient taking differently: Take 4 mg by mouth every 8 (eight) hours as needed for nausea or vomiting.)    pantoprazole  (PROTONIX ) 40 MG tablet Take 40 mg by mouth as needed. 01/25/2023: Is taking every day now instead of prn   rosuvastatin  (CRESTOR ) 40 MG tablet Take 40 mg by mouth at bedtime.    solifenacin  (VESICARE ) 5 MG tablet TAKE 1 TABLET (5 MG TOTAL) BY MOUTH DAILY. (Patient taking differently: Take 5 mg by mouth as needed.)    No facility-administered encounter medications on file as of 03/10/2024.      Medical History: Past Medical History:  Diagnosis Date   Anxiety    Aortic atherosclerosis    Arthritis    Bilateral renal cysts    Coronary artery disease 09/25/2001   a.) LHC 09/25/2001: EF 78%. 20% pLAD - med mgmt; b.) LHC 06/09/2003: EF 63%. 20% pLCx, 20% pLAD, 50% mLAD, 50% dLAD - med mgmt.   Diastolic dysfunction 12/30/2017   a.) Stress echo 12/30/2017: ED >55%, triv PR, mild AR.MR, mod TR, G1DD; b.) TTE 03/30/2020: EF >55%, mild LVH, mild panvalvular regurgitation.   Dysrhythmia    palpitations  when dehydrated   Family history of adverse reaction to anesthesia    daughter had PONV   Gastritis    Generalized anxiety disorder    GERD (gastroesophageal reflux disease)    Headache    with HBP   Hepatic steatosis    History of 2019 novel coronavirus disease (COVID-19) 01/11/2022   History of hiatal hernia    a.) s/p repair 01/2022   Hyperlipemia    Hyperplastic colon polyp    Hypertension    Hypoglycemia 05/2022   this is something new-seeing pcp for this currently   Hypoglycemia    occaisionally when not eating regularly   Pneumonia    10 years ago   PONV (postoperative nausea and vomiting)    a.) PONV following orthopedic surgery (hip) in 2022    Prediabetes    PSVT (paroxysmal supraventricular tachycardia)    Redundant colon    Stroke (HCC)    mini stroke a year ago with no effects afterwards   TIA (transient ischemic attack)    Tubular adenoma of colon    Umbilical hernia      Vital Signs: BP (!) 163/82   Pulse 73   Temp (!) 95.7 F (35.4 C)   Resp 16   Ht 5' 4 (1.626 m)   Wt 157 lb 12.8 oz (71.6 kg)   SpO2 99%   BMI 27.09 kg/m    Review of Systems  Constitutional:  Positive for fatigue.  Respiratory:  Negative for cough, chest tightness, shortness of breath and wheezing.   Cardiovascular: Negative.  Negative for chest pain and palpitations.  Genitourinary:  Positive for difficulty urinating, dysuria, frequency and urgency.  Musculoskeletal:  Positive for arthralgias and back pain.    Physical Exam Vitals reviewed.  Constitutional:      General: She is not in acute distress.    Appearance: Normal appearance. She is not ill-appearing.  HENT:     Head: Normocephalic and atraumatic.  Eyes:     Pupils: Pupils are equal, round, and reactive to light.  Cardiovascular:     Rate and Rhythm: Normal rate and regular rhythm.  Pulmonary:     Effort: Pulmonary effort is normal. No respiratory distress.  Abdominal:     Tenderness: There is abdominal tenderness in the suprapubic area.  Skin:    General: Skin is warm and dry.     Capillary Refill: Capillary refill takes less than 2 seconds.  Neurological:     Mental Status: She is alert and oriented to person, place, and time.  Psychiatric:        Mood and Affect: Mood normal.        Behavior: Behavior normal.       Assessment/Plan: 1. Urinary tract infection without hematuria, site unspecified (Primary) Urine sent for culture. Cipro  prescribed at 500 mg twice daily for 5 days - CULTURE, URINE COMPREHENSIVE - ciprofloxacin  (CIPRO ) 500 MG tablet; Take 1 tablet (500 mg total) by mouth 2 (two) times daily for 5 days. Take with food  Dispense: 10 tablet;  Refill: 0  2. Dysuria Urinalysis is abnormal, sent to lab for culture.  - POCT urinalysis dipstick - phenazopyridine  (PYRIDIUM ) 200 MG tablet; Take 1 tablet (200 mg total) by mouth 3 (three) times daily as needed for pain.  Dispense: 15 tablet; Refill: 0  3. Recurrent UTI Referred to urology  - Ambulatory referral to Urology   General Counseling: michaline kindig understanding of the findings of todays visit and agrees with plan of treatment.  I have discussed any further diagnostic evaluation that may be needed or ordered today. We also reviewed her medications today. she has been encouraged to call the office with any questions or concerns that should arise related to todays visit.    Counseling:    Orders Placed This Encounter  Procedures   CULTURE, URINE COMPREHENSIVE   Ambulatory referral to Urology   POCT urinalysis dipstick    Meds ordered this encounter  Medications   ciprofloxacin  (CIPRO ) 500 MG tablet    Sig: Take 1 tablet (500 mg total) by mouth 2 (two) times daily for 5 days. Take with food    Dispense:  10 tablet    Refill:  0    Fill new script today.   phenazopyridine  (PYRIDIUM ) 200 MG tablet    Sig: Take 1 tablet (200 mg total) by mouth 3 (three) times daily as needed for pain.    Dispense:  15 tablet    Refill:  0    Fill new script today, please use goodrx card if not covered by her insurance    No follow-ups on file.  [place] Controlled Substance Database was reviewed by me for overdose risk score (ORS)  Time spent:30 Minutes Time spent with patient included reviewing progress notes, labs, imaging studies, and discussing plan for follow up.   This patient was seen by Mardy Maxin, FNP-C in collaboration with Dr. Sigrid Bathe as a part of collaborative care agreement.  Dezarai Prew R. Maxin, MSN, FNP-C Internal Medicine

## 2024-03-12 ENCOUNTER — Other Ambulatory Visit: Payer: Self-pay

## 2024-03-12 ENCOUNTER — Emergency Department
Admission: EM | Admit: 2024-03-12 | Discharge: 2024-03-12 | Disposition: A | Discharge status: Home / Self Care | Attending: Emergency Medicine | Admitting: Emergency Medicine

## 2024-03-12 ENCOUNTER — Emergency Department

## 2024-03-12 ENCOUNTER — Telehealth: Payer: Self-pay | Admitting: Physician Assistant

## 2024-03-12 DIAGNOSIS — N189 Chronic kidney disease, unspecified: Secondary | ICD-10-CM | POA: Diagnosis not present

## 2024-03-12 DIAGNOSIS — I129 Hypertensive chronic kidney disease with stage 1 through stage 4 chronic kidney disease, or unspecified chronic kidney disease: Secondary | ICD-10-CM | POA: Diagnosis not present

## 2024-03-12 DIAGNOSIS — R52 Pain, unspecified: Secondary | ICD-10-CM

## 2024-03-12 DIAGNOSIS — R1084 Generalized abdominal pain: Secondary | ICD-10-CM

## 2024-03-12 DIAGNOSIS — R1010 Upper abdominal pain, unspecified: Secondary | ICD-10-CM | POA: Insufficient documentation

## 2024-03-12 DIAGNOSIS — R1031 Right lower quadrant pain: Secondary | ICD-10-CM | POA: Diagnosis not present

## 2024-03-12 LAB — URINALYSIS, W/ REFLEX TO CULTURE (INFECTION SUSPECTED)
Bacteria, UA: NONE SEEN
Bilirubin Urine: NEGATIVE
Glucose, UA: NEGATIVE mg/dL
Hgb urine dipstick: NEGATIVE
Ketones, ur: NEGATIVE mg/dL
Leukocytes,Ua: NEGATIVE
Nitrite: NEGATIVE
Protein, ur: NEGATIVE mg/dL
Specific Gravity, Urine: 1.011 (ref 1.005–1.030)
pH: 6 (ref 5.0–8.0)

## 2024-03-12 LAB — HEPATIC FUNCTION PANEL
ALT: 9 U/L (ref 0–44)
AST: 19 U/L (ref 15–41)
Albumin: 4.3 g/dL (ref 3.5–5.0)
Alkaline Phosphatase: 100 U/L (ref 38–126)
Bilirubin, Direct: 0.2 mg/dL (ref 0.0–0.2)
Indirect Bilirubin: 0.2 mg/dL — ABNORMAL LOW (ref 0.3–0.9)
Total Bilirubin: 0.4 mg/dL (ref 0.0–1.2)
Total Protein: 7 g/dL (ref 6.5–8.1)

## 2024-03-12 LAB — BASIC METABOLIC PANEL WITH GFR
Anion gap: 12 (ref 5–15)
BUN: 15 mg/dL (ref 8–23)
CO2: 24 mmol/L (ref 22–32)
Calcium: 9.3 mg/dL (ref 8.9–10.3)
Chloride: 104 mmol/L (ref 98–111)
Creatinine, Ser: 1.16 mg/dL — ABNORMAL HIGH (ref 0.44–1.00)
GFR, Estimated: 47 mL/min — ABNORMAL LOW (ref >60)
Glucose, Bld: 111 mg/dL — ABNORMAL HIGH (ref 70–99)
Potassium: 3.6 mmol/L (ref 3.5–5.1)
Sodium: 140 mmol/L (ref 135–145)

## 2024-03-12 LAB — LIPASE, BLOOD: Lipase: 24 U/L (ref 11–51)

## 2024-03-12 LAB — CBC
HCT: 40.6 % (ref 36.0–46.0)
Hemoglobin: 13.1 g/dL (ref 12.0–15.0)
MCH: 28.3 pg (ref 26.0–34.0)
MCHC: 32.3 g/dL (ref 30.0–36.0)
MCV: 87.7 fL (ref 80.0–100.0)
Platelets: 260 K/uL (ref 150–400)
RBC: 4.63 MIL/uL (ref 3.87–5.11)
RDW: 14.7 % (ref 11.5–15.5)
WBC: 6.6 K/uL (ref 4.0–10.5)
nRBC: 0 % (ref 0.0–0.2)

## 2024-03-12 LAB — TROPONIN T, HIGH SENSITIVITY: Troponin T High Sensitivity: <15 ng/L (ref 0–19)

## 2024-03-12 LAB — CBG MONITORING, ED: Glucose-Capillary: 103 mg/dL — ABNORMAL HIGH (ref 70–99)

## 2024-03-12 MED ORDER — IOHEXOL 300 MG/ML  SOLN
100.0000 mL | Freq: Once | INTRAMUSCULAR | Status: AC | PRN
  Administered 2024-03-12: 100 mL via INTRAVENOUS

## 2024-03-12 NOTE — Telephone Encounter (Signed)
 Attempted to contact patient to schedule ED follow up. No answer. No vm-Toni

## 2024-03-12 NOTE — ED Triage Notes (Signed)
 POV from homewith multiple complaints. C/O body aches and burning in her chest for 2 days as well as feeling clammy starting tonight around 0000 tonight. Currently being treated for a UTI and on Cipro  x 2 days.

## 2024-03-12 NOTE — ED Notes (Signed)
 Patient transported to CT

## 2024-03-12 NOTE — ED Provider Notes (Signed)
 [facility] Provider Note    Event Date/Time   First MD Initiated Contact with Patient 03/12/24 [phone]     (approximate)   History   Generalized Body Aches and Chest Pain   HPI  Amanda Duke is a 83 year old female with history of hypertension, CKD presenting to the emergency department for evaluation of bodyaches, abdominal pain.  Patient reports that for the past several days she has had dysuria with bodyaches.  Additionally reports burning upper abdominal pain that feels like indigestion.  Does report some lower abdominal pain as well.  Denies nausea or vomiting.  Seen by her primary care doctor and started on Cipro  2 days ago.  Today had worsening body aches leading her to present to the ER.  Reports she has felt similarly when she had a bad UTI in the past.     Physical Exam   Triage Vital Signs: ED Triage Vitals  Encounter Vitals Group     BP 03/12/24 0252 (!) 168/72     Girls Systolic BP Percentile --      Girls Diastolic BP Percentile --      Boys Systolic BP Percentile --      Boys Diastolic BP Percentile --      Pulse Rate 03/12/24 0252 68     Resp 03/12/24 0252 18     Temp 03/12/24 0252 97.7 F (36.5 C)     Temp Source 03/12/24 0252 Oral     SpO2 03/12/24 0252 100 %     Weight --      Height --      Head Circumference --      Peak Flow --      Pain Score 03/12/24 0253 5     Pain Loc --      Pain Education --      Exclude from Growth Chart --     Most recent vital signs: Vitals:   03/12/24 0252  BP: (!) 168/72  Pulse: 68  Resp: 18  Temp: 97.7 F (36.5 C)  SpO2: 100%     General: Awake, interactive  CV:  Good peripheral perfusion Resp:  Unlabored respirations, lungs clear to auscultation Abd:  Nondistended, soft, tenderness to palpation in the upper abdomen as well as in the right lower quadrant Neuro:  Symmetric facial movement, fluid speech   ED Results / Procedures / Treatments   Labs (all labs ordered are  listed, but only abnormal results are displayed) Labs Reviewed  BASIC METABOLIC PANEL WITH GFR - Abnormal; Notable for the following components:      Result Value   Glucose, Bld 111 (*)    Creatinine, Ser 1.16 (*)    GFR, Estimated 47 (*)    All other components within normal limits  HEPATIC FUNCTION PANEL - Abnormal; Notable for the following components:   Indirect Bilirubin 0.2 (*)    All other components within normal limits  URINALYSIS, W/ REFLEX TO CULTURE (INFECTION SUSPECTED) - Abnormal; Notable for the following components:   Color, Urine STRAW (*)    APPearance CLEAR (*)    All other components within normal limits  CBG MONITORING, ED - Abnormal; Notable for the following components:   Glucose-Capillary 103 (*)    All other components within normal limits  CBC  LIPASE, BLOOD  TROPONIN T, HIGH SENSITIVITY     EKG EKG independently reviewed and interpreted by myself demonstrates:  EKG demonstrates sinus rhythm at a rate of 69, PR 192, QRS  100, QTc 481, no acute ST changes  RADIOLOGY Imaging independently reviewed and interpreted by myself demonstrates:  Chest x-Naveena Eyman without focal consolidation CT abdomen pelvis without acute abnormality  Formal Radiology Read:  CT ABDOMEN PELVIS W CONTRAST Result Date: 03/12/2024 EXAM: CT ABDOMEN AND PELVIS WITH CONTRAST 03/12/2024 03:48:29 AM TECHNIQUE: CT of the abdomen and pelvis was performed with the administration of 100 mL of iohexol  (OMNIPAQUE ) 300 MG/ML solution. Multiplanar reformatted images are provided for review. Automated exposure control, iterative reconstruction, and/or weight-based adjustment of the mA/kV was utilized to reduce the radiation dose to as low as reasonably achievable. COMPARISON: 06/04/2022 CLINICAL HISTORY: Abdominal pain, acute, nonlocalized. FINDINGS: LOWER CHEST: Lung bases are clear. LIVER: Liver is within normal limits. GALLBLADDER AND BILE DUCTS: Gallbladder has been surgically removed. No biliary  ductal dilatation. SPLEEN: The spleen is within normal limits. PANCREAS: The pancreas is within normal limits. ADRENAL GLANDS: The adrenal glands are unremarkable. KIDNEYS, URETERS AND BLADDER: The kidneys demonstrate cystic change bilaterally. No further follow-up is recommended. No calculi or obstructive changes are seen. The bladder is well distended. GI AND BOWEL: Stomach and small bowel are within normal limits. Scattered diverticular changes of the colon are seen. No obstructive or inflammatory changes of the colon are noted. The appendix is not visualized, consistent with the given surgical history. PERITONEUM AND RETROPERITONEUM: No free fluid is noted. No free air. VASCULATURE: Atherosclerotic calcifications of the aorta are noted. No aneurysmal dilatation is seen. LYMPH NODES: No lymphadenopathy is noted. REPRODUCTIVE ORGANS: The uterus has been surgically removed. BONES AND SOFT TISSUES: A right hip replacement is seen. Mild degenerative changes of the lumbar spine are noted. IMPRESSION: 1. Chronic changes without acute abnormality. Electronically signed by: Oneil Devonshire MD 03/12/2024 03:52 AM EST RP Workstation: [id number]   DG Chest 1 View Result Date: 03/12/2024 EXAM: 1 VIEW(S) XRAY OF THE CHEST 03/12/2024 03:33:45 AM COMPARISON: 05/29/2022 CLINICAL HISTORY: Chest pain FINDINGS: LUNGS AND PLEURA: No focal pulmonary opacity. Blunting of left costophrenic angle suggesting trace effusion. No pneumothorax. HEART AND MEDIASTINUM: No acute abnormality of the cardiac and mediastinal silhouettes. Aortic atherosclerosis. BONES AND SOFT TISSUES: No acute osseous abnormality. IMPRESSION: 1. Blunting of the left costophrenic angle suggesting a trace pleural effusion. 2. Aortic atherosclerosis. Electronically signed by: Oneil Devonshire MD 03/12/2024 03:39 AM EST RP Workstation: [id number]    PROCEDURES:  Critical Care performed: No  Procedures   MEDICATIONS ORDERED IN ED: Medications  iohexol  (OMNIPAQUE )  300 MG/ML solution 100 mL (100 mLs Intravenous Contrast Given 03/12/24 0339)     IMPRESSION / MDM / ASSESSMENT AND PLAN / ED COURSE  I reviewed the triage vital signs and the nursing notes.  Differential diagnosis includes, but is not limited to, UTI, viral illness, anemia, electrolyte abnormality, appendicitis, other acute intra-abdominal process, ACS, pneumonia, pneumothorax  Patient's presentation is most consistent with acute presentation with potential threat to life or bodily function.  83 year old female presenting with bodyaches, abdominal pain in the setting of recent UTI.  Stable vitals on presentation.  Will obtain labs, x-Corinthian Kemler, CT abdomen pelvis to further evaluate.  Labs with reassuring CBC with normal white blood cell count, BMP without critical derangements, stable mild renal impairment noted.  Normal lipase.  Normal LFTs.  UA without evidence of infection though more difficult to interpret in the setting of recent antibiotic use.  Negative troponin.  EKG without acute ischemic findings.  Imaging overall reassuring.  Patient reassessed.  Does report improvement of her symptoms on reevaluation.  No further chest  pain or indigestion.  Discussed results of workup.  Patient is comfortable with discharge home.  Strict return precautions provided.  Patient discharged in stable condition.       FINAL CLINICAL IMPRESSION(S) / ED DIAGNOSES   Final diagnoses:  Body aches  Generalized abdominal pain     Rx / DC Orders   ED Discharge Orders     None        Note:  This document was prepared using Dragon voice recognition software and may include unintentional dictation errors.   Levander Slate, MD 03/12/24 [phone]

## 2024-03-12 NOTE — Discharge Instructions (Signed)
 You are seen in the ER today for your body aches and abdominal pain.  Your lab work and imaging fortunately did not show an emergency cause for this.  This could be related to symptoms from your UTI that is getting treated or you may have a viral illness.  Follow with your primary care doctor for further evaluation.  Return to the ER for new or worsening symptoms.

## 2024-03-12 NOTE — ED Notes (Signed)
 Gait steady, no complaints. Agreeable with care plan. Understands dc instructions.

## 2024-03-15 ENCOUNTER — Encounter: Payer: Self-pay | Admitting: Nurse Practitioner

## 2024-03-16 LAB — CULTURE, URINE COMPREHENSIVE

## 2024-03-19 ENCOUNTER — Ambulatory Visit (INDEPENDENT_AMBULATORY_CARE_PROVIDER_SITE_OTHER): Admitting: Physician Assistant

## 2024-03-19 ENCOUNTER — Encounter: Payer: Self-pay | Admitting: Physician Assistant

## 2024-03-19 VITALS — BP 136/86 | HR 73 | Temp 98.0°F | Resp 16 | Ht 64.0 in | Wt 164.0 lb

## 2024-03-19 DIAGNOSIS — N1831 Chronic kidney disease, stage 3a: Secondary | ICD-10-CM | POA: Diagnosis not present

## 2024-03-19 DIAGNOSIS — R2689 Other abnormalities of gait and mobility: Secondary | ICD-10-CM | POA: Diagnosis not present

## 2024-03-19 DIAGNOSIS — N390 Urinary tract infection, site not specified: Secondary | ICD-10-CM

## 2024-03-19 DIAGNOSIS — R5383 Other fatigue: Secondary | ICD-10-CM

## 2024-03-19 NOTE — Progress Notes (Signed)
 [facility] [address] [place], [place] [address]  Internal MEDICINE  Office Visit Note  Patient Name: Amanda Duke  [id number]  [id number]  Date of Service: 03/19/2024  Chief Complaint  Patient presents with   Follow-up    ED follow-up for UTI    HPI Pt is here for ED follow up -was seen in office 11/12 for UTI and started on Cipro , but started to not feel well and had body aches and was worried she could be septic so she went to ED 11/14. She reports having sepsis several years ago from a UTI so wanted to be safe and get checked out -symptoms resolved now -ABX finished -will be off balance sometimes with cane alone and would like rollator to help with trying to increase exercise while maintaining safety  Current Medication: Outpatient Encounter Medications as of 03/19/2024  Medication Sig Note   acetaminophen  (TYLENOL ) 500 MG tablet Take 1,000 mg by mouth every 6 (six) hours as needed for moderate pain.    aspirin  EC 81 MG tablet Take 81 mg by mouth daily. Swallow whole.    cholecalciferol  (VITAMIN D ) 1000 units tablet Take 1,000 Units by mouth daily.    clonazePAM  (KLONOPIN ) 0.5 MG tablet TAKE 1 TABLET (0.5 MG TOTAL) BY MOUTH AS NEEDED FOR ANXIETY. TAKE 1 TABLET BY MOUTH AS NEEDED    diltiazem  (CARDIZEM  LA) 240 MG 24 hr tablet TAKE 1 TABLET BY MOUTH EVERY DAY    linaclotide  (LINZESS ) 290 MCG CAPS capsule Take 1 capsule (290 mcg total) by mouth daily. (Patient taking differently: Take 290 mcg by mouth daily before breakfast.)    losartan  (COZAAR ) 100 MG tablet Take 100 mg by mouth every morning.    ondansetron  (ZOFRAN -ODT) 4 MG disintegrating tablet Take 1 tablet (4 mg total) by mouth every 8 (eight) hours as needed. (Patient taking differently: Take 4 mg by mouth every 8 (eight) hours as needed for nausea or vomiting.)    rosuvastatin  (CRESTOR ) 40 MG tablet Take 40 mg by mouth at bedtime.    [DISCONTINUED] fluticasone  (FLONASE ) 50 MCG/ACT nasal spray SPRAY 1  SPRAY INTO EACH NOSTRIL EVERY DAY    [DISCONTINUED] icosapent  Ethyl (VASCEPA ) 1 g capsule Take 2 g by mouth 2 (two) times daily.    [DISCONTINUED] pantoprazole  (PROTONIX ) 40 MG tablet Take 40 mg by mouth as needed. 01/25/2023: Is taking every day now instead of prn   [DISCONTINUED] phenazopyridine  (PYRIDIUM ) 200 MG tablet Take 1 tablet (200 mg total) by mouth 3 (three) times daily as needed for pain.    [DISCONTINUED] solifenacin  (VESICARE ) 5 MG tablet TAKE 1 TABLET (5 MG TOTAL) BY MOUTH DAILY. (Patient taking differently: Take 5 mg by mouth as needed.)    No facility-administered encounter medications on file as of 03/19/2024.    Surgical History: Past Surgical History:  Procedure Laterality Date   APPENDECTOMY  1971   ARTHRODESIS  1980   BACK SURGERY  1980   CATARACT EXTRACTION W/PHACO Left 01/05/2024   Procedure: PHACOEMULSIFICATION, CATARACT, WITH IOL INSERTION 5.38 00:41.3;  Surgeon: Myrna Adine Anes, MD;  Location: [facility];  Service: Ophthalmology;  Laterality: Left;   CATARACT EXTRACTION W/PHACO Right 01/19/2024   Procedure: PHACOEMULSIFICATION, CATARACT, WITH IOL INSERTION 3.67 00:31.9;  Surgeon: Myrna Adine Anes, MD;  Location: [facility];  Service: Ophthalmology;  Laterality: Right;   CHOLECYSTECTOMY N/A    COLONOSCOPY WITH PROPOFOL  N/A 12/12/2016   Procedure: COLONOSCOPY WITH PROPOFOL ;  Surgeon: Gaylyn Gladis PENNER, MD;  Location: [facility]  ENDOSCOPY;  Service: Endoscopy;  Laterality: N/A;   COLONOSCOPY WITH PROPOFOL  N/A 07/14/2019   Procedure: COLONOSCOPY WITH PROPOFOL ;  Surgeon: Toledo, Ladell POUR, MD;  Location: [facility] ENDOSCOPY;  Service: Gastroenterology;  Laterality: N/A;   ESOPHAGOGASTRODUODENOSCOPY N/A 12/27/2021   Procedure: ESOPHAGOGASTRODUODENOSCOPY (EGD);  Surgeon: Tye Millet, DO;  Location: [facility] ENDOSCOPY;  Service: General;  Laterality: N/A;   ESOPHAGOGASTRODUODENOSCOPY (EGD) WITH PROPOFOL  N/A 12/12/2016   Procedure: ESOPHAGOGASTRODUODENOSCOPY (EGD)  WITH PROPOFOL ;  Surgeon: Gaylyn Gladis PENNER, MD;  Location: [facility] ENDOSCOPY;  Service: Endoscopy;  Laterality: N/A;   ESOPHAGOGASTRODUODENOSCOPY (EGD) WITH PROPOFOL  N/A 07/14/2019   Procedure: ESOPHAGOGASTRODUODENOSCOPY (EGD) WITH PROPOFOL ;  Surgeon: Toledo, Ladell POUR, MD;  Location: [facility] ENDOSCOPY;  Service: Gastroenterology;  Laterality: N/A;   INSERTION OF MESH  02/05/2022   Procedure: INSERTION OF MESH;  Surgeon: Jordis Laneta FALCON, MD;  Location: [facility] ORS;  Service: General;;   LAPAROSCOPIC UNILATERAL SALPINGO OOPHERECTOMY  1985   right   LEFT HEART CATH AND CORONARY ANGIOGRAPHY Left 09/25/2001   Procedure: LEFT HEART CATH AND CORONARY ANGIOGRAPHY; Location: [facility]; Surgeon: Margie Lovelace, MD   LEFT HEART CATH AND CORONARY ANGIOGRAPHY Left 06/09/2003   Procedure: LEFT HEART CATH AND CORONARY ANGIOGRAPHY; Location: [facility]; Surgeon: Margie Lovelace, MD   ORIF ANKLE FRACTURE Right    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    TONSILLECTOMY  1948   TUBAL LIGATION  1971   UMBILICAL HERNIA REPAIR N/A 02/05/2022   Procedure: HERNIA REPAIR UMBILICAL ADULT;  Surgeon: Jordis Laneta FALCON, MD;  Location: [facility] OR[facility];  Service: General;  Laterality: N/A;   VAGINAL HYSTERECTOMY  1985   d/t heavy bleeding   XI ROBOTIC ASSISTED PARAESOPHAGEAL HERNIA REPAIR N/A 02/05/2022   Procedure: XI ROBOTIC ASSISTED PARAESOPHAGEAL HERNIA REPAIR, RNFA to assist;  Surgeon: Jordis Laneta FALCON, MD;  Location: [facility] OR[facility];  Service: General;  Laterality: N/A;    Medical History: Past Medical History:  Diagnosis Date   Anxiety    Aortic atherosclerosis    Arthritis    Bilateral renal cysts    Coronary artery disease 09/25/2001   a.) LHC 09/25/2001: EF 78%. 20% pLAD - med mgmt; b.) LHC 06/09/2003: EF 63%. 20% pLCx, 20% pLAD, 50% mLAD, 50% dLAD - med mgmt.   Diastolic dysfunction 12/30/2017   a.) Stress echo 12/30/2017: ED >55%, triv PR, mild AR.MR, mod TR, G1DD; b.) TTE 03/30/2020: EF >55%, mild LVH, mild panvalvular  regurgitation.   Dysrhythmia    palpitations when dehydrated   Family history of adverse reaction to anesthesia    daughter had PONV   Gastritis    Generalized anxiety disorder    GERD (gastroesophageal reflux disease)    Headache    with HBP   Hepatic steatosis    History of 2019 novel coronavirus disease (COVID-19) 01/11/2022   History of hiatal hernia    a.) s/p repair 01/2022   Hyperlipemia    Hyperplastic colon polyp    Hypertension    Hypoglycemia 05/2022   this is something new-seeing pcp for this currently   Hypoglycemia    occaisionally when not eating regularly   Pneumonia    10 years ago   PONV (postoperative nausea and vomiting)    a.) PONV following orthopedic surgery (hip) in 2022   Prediabetes    PSVT (paroxysmal supraventricular tachycardia)    Redundant colon    Stroke (HCC)    mini stroke a year ago with no effects afterwards   TIA (transient ischemic attack)    Tubular adenoma  of colon    Umbilical hernia     Family History: Family History  Problem Relation Age of Onset   Heart failure Mother    Stroke Mother    Breast cancer Mother        dx at age 26 yo   Diabetes Mother    Osteoporosis Mother    Emphysema Father    Ovarian cancer Sister        dx at age 35 yo   Asthma Sister    Diabetes Daughter    Heart disease Daughter    COPD Daughter     Social History   Socioeconomic History   Marital status: Widowed    Spouse name: Not on file   Number of children: Not on file   Years of education: Not on file   Highest education level: Not on file  Occupational History   Not on file  Tobacco Use   Smoking status: Former    Current packs/day: 0.00    Average packs/day: 0.5 packs/day for 5.0 years (2.5 ttl pk-yrs)    Types: Cigarettes    Start date: 65    Quit date: 21    Years since quitting: 45.9    Passive exposure: Past   Smokeless tobacco: Never  Vaping Use   Vaping status: Never Used  Substance and Sexual Activity    Alcohol use: No    Alcohol/week: 0.0 standard drinks of alcohol   Drug use: Never   Sexual activity: Not Currently  Other Topics Concern   Not on file  Social History Narrative   Not on file   Social Drivers of Health   Financial Resource Strain: Low Risk  (11/17/2020)   Overall Financial Resource Strain (CARDIA)    Difficulty of Paying Living Expenses: Not very hard  Food Insecurity: No Food Insecurity (01/25/2023)   Hunger Vital Sign    Worried About Running Out of Food in the Last Year: Never true    Ran Out of Food in the Last Year: Never true  Transportation Needs: No Transportation Needs (01/25/2023)   PRAPARE - Administrator, Civil Service (Medical): No    Lack of Transportation (Non-Medical): No  Physical Activity: Inactive (01/10/2019)   Exercise Vital Sign    Days of Exercise per Week: 0 days    Minutes of Exercise per Session: 0 min  Stress: No Stress Concern Present (01/10/2019)   [employer] of Occupational Health - Occupational Stress Questionnaire    Feeling of Stress : Not at all  Social Connections: Socially Isolated (01/10/2019)   Social Connection and Isolation Panel    Frequency of Communication with Friends and Family: Three times a week    Frequency of Social Gatherings with Friends and Family: Three times a week    Attends Religious Services: Never    Active Member of Clubs or Organizations: No    Attends Banker Meetings: Never    Marital Status: Widowed  Intimate Partner Violence: Unknown (01/25/2023)   Humiliation, Afraid, Rape, and Kick questionnaire    Fear of Current or Ex-Partner: No    Emotionally Abused: No    Physically Abused: Not on file    Sexually Abused: No      Review of Systems  Constitutional:  Negative for chills and unexpected weight change.  HENT:  Negative for congestion, dental problem, postnasal drip, rhinorrhea, sneezing and sore throat.   Eyes:  Negative for redness.  Respiratory: Negative.   Negative for cough,  chest tightness, shortness of breath and wheezing.   Cardiovascular: Negative.  Negative for chest pain and palpitations.  Gastrointestinal:  Negative for abdominal pain, constipation, diarrhea, nausea and vomiting.  Genitourinary:  Negative for dysuria and frequency.  Musculoskeletal:  Positive for arthralgias and gait problem. Negative for back pain, joint swelling and neck pain.  Skin:  Negative for rash.  Neurological:  Negative for tremors and numbness.  Hematological:  Negative for adenopathy. Does not bruise/bleed easily.  Psychiatric/Behavioral:  Negative for behavioral problems (Depression) and suicidal ideas. The patient is nervous/anxious.     Vital Signs: BP (!) 140/90   Pulse 73   Temp 98 F (36.7 C)   Resp 16   Ht 5' 4 (1.626 m)   Wt 164 lb (74.4 kg)   SpO2 98%   BMI 28.15 kg/m    Physical Exam Vitals and nursing note reviewed.  Constitutional:      Appearance: Normal appearance.  HENT:     Head: Normocephalic and atraumatic.  Eyes:     Extraocular Movements: Extraocular movements intact.  Cardiovascular:     Rate and Rhythm: Normal rate and regular rhythm.  Pulmonary:     Effort: Pulmonary effort is normal.     Breath sounds: Normal breath sounds.  Skin:    General: Skin is warm and dry.  Neurological:     Mental Status: She is alert.     Gait: Gait abnormal.     Comments: Walking with cane  Psychiatric:        Mood and Affect: Mood normal.        Behavior: Behavior normal.        Assessment/Plan: 1. Recurrent UTI (Primary) Will be seeing urology for recurrent UTI's.   2. Stage 3a chronic kidney disease (HCC) Will monitor labs - Basic Metabolic Panel (BMET)  3. Balance problem Will order rollator to help safety - For home use only DME 4 wheeled rolling walker with seat ([id number])  4. Other fatigue Will order rollator to help safety - For home use only DME 4 wheeled rolling walker with seat ([id number])   General  Counseling: Amanda Duke verbalizes understanding of the findings of todays visit and agrees with plan of treatment. I have discussed any further diagnostic evaluation that may be needed or ordered today. We also reviewed her medications today. she has been encouraged to call the office with any questions or concerns that should arise related to todays visit.    No orders of the defined types were placed in this encounter.   No orders of the defined types were placed in this encounter.   This patient was seen by Tinnie Pro, PA-C in collaboration with Dr. Sigrid Bathe as a part of collaborative care agreement.   Total time spent:30 Minutes Time spent includes review of chart, medications, test results, and follow up plan with the patient.      Dr Fozia M Khan Internal medicine

## 2024-03-29 ENCOUNTER — Ambulatory Visit: Admitting: Physician Assistant

## 2024-03-29 ENCOUNTER — Telehealth: Payer: Self-pay

## 2024-03-29 NOTE — Telephone Encounter (Signed)
 Sent message to [employer] for rolling walker

## 2024-04-01 ENCOUNTER — Encounter: Payer: Self-pay | Admitting: Physician Assistant

## 2024-04-01 ENCOUNTER — Ambulatory Visit: Admitting: Physician Assistant

## 2024-04-01 VITALS — BP 182/92 | HR 80 | Ht 65.0 in | Wt 167.0 lb

## 2024-04-01 DIAGNOSIS — N819 Female genital prolapse, unspecified: Secondary | ICD-10-CM | POA: Diagnosis not present

## 2024-04-01 DIAGNOSIS — N281 Cyst of kidney, acquired: Secondary | ICD-10-CM

## 2024-04-01 DIAGNOSIS — N390 Urinary tract infection, site not specified: Secondary | ICD-10-CM | POA: Diagnosis not present

## 2024-04-01 DIAGNOSIS — N3941 Urge incontinence: Secondary | ICD-10-CM

## 2024-04-01 LAB — MICROSCOPIC EXAMINATION: Epithelial Cells (non renal): >10 /HPF — AB (ref 0–10)

## 2024-04-01 LAB — URINALYSIS, COMPLETE
Bilirubin, UA: NEGATIVE
Glucose, UA: NEGATIVE
Ketones, UA: NEGATIVE
Leukocytes,UA: NEGATIVE
Nitrite, UA: NEGATIVE
Protein,UA: NEGATIVE
RBC, UA: NEGATIVE
Specific Gravity, UA: 1.02 (ref 1.005–1.030)
Urobilinogen, Ur: 0.2 mg/dL (ref 0.2–1.0)
pH, UA: 6 (ref 5.0–7.5)

## 2024-04-01 LAB — BLADDER SCAN AMB NON-IMAGING

## 2024-04-01 MED ORDER — ESTRADIOL 0.01 % VA CREA: TOPICAL_CREAM | VAGINAL | 12 refills | Status: AC

## 2024-04-01 NOTE — Patient Instructions (Signed)
 Recurrent UTI Prevention Strategies  Stay well hydrated. Manage your constipation. Your goal is to have consistent, formed bowel movements that are easy for you to pass. You may use either of the over-the-counter supplements Benefiber or Miralax  to help with this. I recommend that you try Benefiber first and move on to Miralax  if this is not helping you enough. You may adjust the recommended dose of Miralax  (one capful daily) to achieve this goal. Start taking an over-the-counter cranberry supplement for urinary tract health, with or without d-mannose. Take this once or twice daily on an empty stomach, e.g. right before bed. Start taking an over-the-counter probiotic containing the bacterial species called Lactobacillus. Take this daily. Start vaginal estrogen cream. Apply a pea-sized amount around the opening of the urethra every day for 2 weeks, then three times weekly forever.

## 2024-04-01 NOTE — Progress Notes (Signed)
 04/01/2024 11:03 AM   Amanda Duke Sep 16, 1940 [id number]  CC: Chief Complaint  Patient presents with   Recurrent UTI   HPI: NIZHONI PARLOW is a 83 y.o. female with PMH OAB wet, POP with cystocele and rectocele, and complex left renal cyst who presents today as a new patient for evaluation of recurrent UTI.  She was previously a patient of Dr. Bobetta for her renal cyst, last seen in 2019.  She had a CTAP with contrast on 03/12/2024, which showed stable bilateral renal cysts with no recommendation for follow-up imaging.  No hydronephrosis or stones seen.  Today she reports 4 UTIs this year, managed by her PCP.  She most initially completed a course of Cipro  last month.  Her symptoms typically include increased frequency, malodorous urine, and back pain with only rare burning.  Her symptoms resolved with antibiotics.  She is asymptomatic today.  She denies gross hematuria.  No history of urolithiasis.  She had an episode of urosepsis 7 years ago.  She feels her cystocele is stable and denies new or worse bulging/vaginal pressure.  No personal history of breast or GYN cancers.  Her mother had breast cancer and sister had ovarian cancer, however her genetic testing was negative.  She does have a history of constipation.  Urine culture history as follows: 03/10/2024: Ampicillin resistant Klebsiella pneumoniae 05/08/2023: E. coli  In-office UA today pan negative; urine microscopy with >10 epithelial cells/hpf and many bacteria. PVR 74mL.  PMH: Past Medical History:  Diagnosis Date   Anxiety    Aortic atherosclerosis    Arthritis    Bilateral renal cysts    Coronary artery disease 09/25/2001   a.) LHC 09/25/2001: EF 78%. 20% pLAD - med mgmt; b.) LHC 06/09/2003: EF 63%. 20% pLCx, 20% pLAD, 50% mLAD, 50% dLAD - med mgmt.   Diastolic dysfunction 12/30/2017   a.) Stress echo 12/30/2017: ED >55%, triv PR, mild AR.MR, mod TR, G1DD; b.) TTE 03/30/2020: EF >55%, mild LVH, mild panvalvular  regurgitation.   Dysrhythmia    palpitations when dehydrated   Family history of adverse reaction to anesthesia    daughter had PONV   Gastritis    Generalized anxiety disorder    GERD (gastroesophageal reflux disease)    Headache    with HBP   Hepatic steatosis    History of 2019 novel coronavirus disease (COVID-19) 01/11/2022   History of hiatal hernia    a.) s/p repair 01/2022   Hyperlipemia    Hyperplastic colon polyp    Hypertension    Hypoglycemia 05/2022   this is something new-seeing pcp for this currently   Hypoglycemia    occaisionally when not eating regularly   Pneumonia    10 years ago   PONV (postoperative nausea and vomiting)    a.) PONV following orthopedic surgery (hip) in 2022   Prediabetes    PSVT (paroxysmal supraventricular tachycardia)    Redundant colon    Stroke (HCC)    mini stroke a year ago with no effects afterwards   TIA (transient ischemic attack)    Tubular adenoma of colon    Umbilical hernia     Surgical History: Past Surgical History:  Procedure Laterality Date   APPENDECTOMY  1971   ARTHRODESIS  1980   BACK SURGERY  1980   CATARACT EXTRACTION W/PHACO Left 01/05/2024   Procedure: PHACOEMULSIFICATION, CATARACT, WITH IOL INSERTION 5.38 00:41.3;  Surgeon: Myrna Adine Anes, MD;  Location: [facility];  Service: Ophthalmology;  Laterality:  Left;   CATARACT EXTRACTION W/PHACO Right 01/19/2024   Procedure: PHACOEMULSIFICATION, CATARACT, WITH IOL INSERTION 3.67 00:31.9;  Surgeon: Myrna Adine Anes, MD;  Location: [facility];  Service: Ophthalmology;  Laterality: Right;   CHOLECYSTECTOMY N/A    COLONOSCOPY WITH PROPOFOL  N/A 12/12/2016   Procedure: COLONOSCOPY WITH PROPOFOL ;  Surgeon: Gaylyn Gladis PENNER, MD;  Location: [facility];  Service: Endoscopy;  Laterality: N/A;   COLONOSCOPY WITH PROPOFOL  N/A 07/14/2019   Procedure: COLONOSCOPY WITH PROPOFOL ;  Surgeon: Toledo, Ladell POUR, MD;  Location: [facility] ENDOSCOPY;  Service:  Gastroenterology;  Laterality: N/A;   ESOPHAGOGASTRODUODENOSCOPY N/A 12/27/2021   Procedure: ESOPHAGOGASTRODUODENOSCOPY (EGD);  Surgeon: Tye Millet, DO;  Location: [facility] ENDOSCOPY;  Service: General;  Laterality: N/A;   ESOPHAGOGASTRODUODENOSCOPY (EGD) WITH PROPOFOL  N/A 12/12/2016   Procedure: ESOPHAGOGASTRODUODENOSCOPY (EGD) WITH PROPOFOL ;  Surgeon: Gaylyn Gladis PENNER, MD;  Location: [facility];  Service: Endoscopy;  Laterality: N/A;   ESOPHAGOGASTRODUODENOSCOPY (EGD) WITH PROPOFOL  N/A 07/14/2019   Procedure: ESOPHAGOGASTRODUODENOSCOPY (EGD) WITH PROPOFOL ;  Surgeon: Toledo, Ladell POUR, MD;  Location: [facility] ENDOSCOPY;  Service: Gastroenterology;  Laterality: N/A;   INSERTION OF MESH  02/05/2022   Procedure: INSERTION OF MESH;  Surgeon: Jordis Laneta FALCON, MD;  Location: [facility] OR[facility];  Service: General;;   LAPAROSCOPIC UNILATERAL SALPINGO OOPHERECTOMY  1985   right   LEFT HEART CATH AND CORONARY ANGIOGRAPHY Left 09/25/2001   Procedure: LEFT HEART CATH AND CORONARY ANGIOGRAPHY; Location: [facility]; Surgeon: Margie Lovelace, MD   LEFT HEART CATH AND CORONARY ANGIOGRAPHY Left 06/09/2003   Procedure: LEFT HEART CATH AND CORONARY ANGIOGRAPHY; Location: [facility]; Surgeon: Margie Lovelace, MD   ORIF ANKLE FRACTURE Right    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    TONSILLECTOMY  1948   TUBAL LIGATION  1971   UMBILICAL HERNIA REPAIR N/A 02/05/2022   Procedure: HERNIA REPAIR UMBILICAL ADULT;  Surgeon: Jordis Laneta FALCON, MD;  Location: [facility];  Service: General;  Laterality: N/A;   VAGINAL HYSTERECTOMY  1985   d/t heavy bleeding   XI ROBOTIC ASSISTED PARAESOPHAGEAL HERNIA REPAIR N/A 02/05/2022   Procedure: XI ROBOTIC ASSISTED PARAESOPHAGEAL HERNIA REPAIR, RNFA to assist;  Surgeon: Jordis Laneta FALCON, MD;  Location: [facility];  Service: General;  Laterality: N/A;    Home Medications:  Allergies as of 04/01/2024       Reactions   Amoxicillin Hives   Patient tolerated Ceftriaxone  in ED on 01/10/19.     Clindamycin Diarrhea   Gemfibrozil Other (See Comments)   muscle ache   Levofloxacin Other (See Comments)   Muscle ache for several months   Metoprolol Other (See Comments)   Lowers blood pressure extremely low   Simvastatin Other (See Comments)   muscle ache   Macrobid  [nitrofurantoin  Macrocrystal] Diarrhea   Niacin Rash   Nystatin Rash   Nystatin-triamcinolone  Rash   Sulfa Antibiotics Rash   Family history of medication allergy        Medication List        Accurate as of April 01, 2024 11:03 AM. If you have any questions, ask your nurse or doctor.          acetaminophen  500 MG tablet Commonly known as: TYLENOL  Take 1,000 mg by mouth every 6 (six) hours as needed for moderate pain.   aspirin  EC 81 MG tablet Take 81 mg by mouth daily. Swallow whole.   cholecalciferol  1000 units tablet Commonly known as: VITAMIN D  Take 1,000 Units by mouth daily.   clonazePAM  0.5 MG tablet Commonly known as: KLONOPIN   TAKE 1 TABLET (0.5 MG TOTAL) BY MOUTH AS NEEDED FOR ANXIETY. TAKE 1 TABLET BY MOUTH AS NEEDED   diltiazem  240 MG 24 hr tablet Commonly known as: CARDIZEM  LA TAKE 1 TABLET BY MOUTH EVERY DAY   linaclotide  290 MCG Caps capsule Commonly known as: Linzess  Take 1 capsule (290 mcg total) by mouth daily. What changed: when to take this   losartan  100 MG tablet Commonly known as: COZAAR  Take 100 mg by mouth every morning.   ondansetron  4 MG disintegrating tablet Commonly known as: ZOFRAN -ODT Take 1 tablet (4 mg total) by mouth every 8 (eight) hours as needed. What changed: reasons to take this   rosuvastatin  40 MG tablet Commonly known as: CRESTOR  Take 40 mg by mouth at bedtime.        Allergies:  Allergies  Allergen Reactions   Amoxicillin Hives    Patient tolerated Ceftriaxone  in ED on 01/10/19.    Clindamycin Diarrhea   Gemfibrozil Other (See Comments)    muscle ache   Levofloxacin Other (See Comments)    Muscle ache for several months    Metoprolol Other (See Comments)    Lowers blood pressure extremely low   Simvastatin Other (See Comments)    muscle ache   Macrobid  [Nitrofurantoin  Macrocrystal] Diarrhea   Niacin Rash   Nystatin Rash   Nystatin-Triamcinolone  Rash   Sulfa Antibiotics Rash    Family history of medication allergy    Family History: Family History  Problem Relation Age of Onset   Heart failure Mother    Stroke Mother    Breast cancer Mother        dx at age 35 yo   Diabetes Mother    Osteoporosis Mother    Emphysema Father    Ovarian cancer Sister        dx at age 36 yo   Asthma Sister    Diabetes Daughter    Heart disease Daughter    COPD Daughter     Social History:   reports that she quit smoking about 45 years ago. Her smoking use included cigarettes. She started smoking about 50 years ago. She has a 2.5 pack-year smoking history. She has been exposed to tobacco smoke. She has never used smokeless tobacco. She reports that she does not drink alcohol and does not use drugs.  Physical Exam: BP (!) 182/92   Pulse 80   Ht 5' 5 (1.651 m)   Wt 167 lb (75.8 kg)   BMI 27.79 kg/m   Constitutional:  Alert and oriented, no acute distress, nontoxic appearing HEENT: [place], AT Cardiovascular: No clubbing, cyanosis, or edema Respiratory: Normal respiratory effort, no increased work of breathing Skin: No rashes, bruises or suspicious lesions Neurologic: Grossly intact, no focal deficits, moving all 4 extremities Psychiatric: Normal mood and affect  Laboratory Data: Results for orders placed or performed in visit on 04/01/24  BLADDER SCAN AMB NON-IMAGING   Collection Time: 04/01/24 11:01 AM  Result Value Ref Range   Scan Result 74ml    Assessment & Plan:   1. Recurrent UTI (Primary) Recurrent UTI per symptom reports, though insufficient culture data to corroborate this.  She is emptying appropriately.  We discussed recurrent UTI prevention strategies including p.o. hydration, managing  constipation, topical vaginal estrogen cream, cranberry supplements, d-mannose supplements, and lactobacillus containing probiotics.  See her back in 3 months for recheck.  If she is having recurrent UTIs despite this regimen, can consider low-dose daily prophylaxis. - Urinalysis, Complete - BLADDER SCAN AMB  NON-IMAGING - estradiol (ESTRACE) 0.01 % CREA vaginal cream; Apply one pea-sized amount around the opening of the urethra daily for 2 weeks, then 3 times weekly moving forward.  Dispense: 42.5 g; Refill: 12  2. Renal cyst Stable, nothing concerning on recent contrast-enhanced imaging.  No further monitoring indicated.  3. Urge incontinence Chronic, stable, minimally bothersome.  Will continue to monitor.  4. Female genital prolapse, unspecified type Known history of cystocele and rectocele, not bothersome.  Will continue to monitor.  We discussed referral to urogyn in the future if these worsen for consideration of pessary versus surgery.  Return in about 3 months (around 06/30/2024) for rUTI follow up.  Lucie Hones, PA-C  [facility] [place] [address] [place], [place] [address] [phone]

## 2024-04-07 ENCOUNTER — Other Ambulatory Visit
Admission: RE | Admit: 2024-04-07 | Discharge: 2024-04-07 | Disposition: A | Discharge status: Home / Self Care | Source: Ambulatory Visit | Attending: Physician Assistant | Admitting: Physician Assistant

## 2024-04-07 ENCOUNTER — Ambulatory Visit: Payer: Self-pay | Admitting: Physician Assistant

## 2024-04-07 DIAGNOSIS — N1831 Chronic kidney disease, stage 3a: Secondary | ICD-10-CM | POA: Insufficient documentation

## 2024-04-07 LAB — BASIC METABOLIC PANEL WITH GFR
Anion gap: 10 (ref 5–15)
BUN: 16 mg/dL (ref 8–23)
CO2: 28 mmol/L (ref 22–32)
Calcium: 9.8 mg/dL (ref 8.9–10.3)
Chloride: 104 mmol/L (ref 98–111)
Creatinine, Ser: 1.03 mg/dL — ABNORMAL HIGH (ref 0.44–1.00)
GFR, Estimated: 54 mL/min — ABNORMAL LOW (ref >60)
Glucose, Bld: 96 mg/dL (ref 70–99)
Potassium: 3.8 mmol/L (ref 3.5–5.1)
Sodium: 142 mmol/L (ref 135–145)

## 2024-04-08 ENCOUNTER — Encounter: Payer: Self-pay | Admitting: Physician Assistant

## 2024-04-08 ENCOUNTER — Ambulatory Visit: Payer: 59 | Admitting: Physician Assistant

## 2024-04-08 VITALS — BP 120/81 | HR 74 | Temp 98.0°F | Resp 16 | Ht 65.0 in | Wt 163.0 lb

## 2024-04-08 DIAGNOSIS — R2689 Other abnormalities of gait and mobility: Secondary | ICD-10-CM

## 2024-04-08 DIAGNOSIS — N390 Urinary tract infection, site not specified: Secondary | ICD-10-CM | POA: Diagnosis not present

## 2024-04-08 DIAGNOSIS — N1831 Chronic kidney disease, stage 3a: Secondary | ICD-10-CM | POA: Diagnosis not present

## 2024-04-08 DIAGNOSIS — Z0001 Encounter for general adult medical examination with abnormal findings: Secondary | ICD-10-CM

## 2024-04-08 NOTE — Progress Notes (Signed)
 [facility] [address] [place], [place] [address]  Internal MEDICINE  Office Visit Note  Patient Name: Amanda Duke  [id number]  [id number]  Date of Service: 04/08/2024  Chief Complaint  Patient presents with   Medicare Wellness   Hypertension   Hyperlipidemia   Quality Metric Gaps    Shingles vaccines    HPI Amanda Duke presents for an annual well visit Well-appearing 83 y.o.female Labs: BMP improved Other concerns: had urology appt for recurrent UTI and has been prescribed estradiol  cream, cranberry supplement, d-mannose supplement and lactobacillus probiotic -sleeping well, unless left hip bothersome--hx of bursitis in this hip, had replacement of right hip and it is doing well. -appetite doing well -heard about walker and was told because she got a cane recently with U card that walker wouldn't be covered with this for 5 years. She is going to contact insurance about this     04/08/2024    2:31 PM 04/03/2023    3:10 PM 03/28/2022    2:20 PM  MMSE - Mini Mental State Exam  Orientation to time 5 5 5   Orientation to Place 5 5 5   Registration 3 3 3   Attention/ Calculation 5 5 5   Recall 3 3 3   Language- name 2 objects 2 2 2   Language- repeat 1 1 1   Language- follow 3 step command 3 3 3   Language- read & follow direction 1 1 1   Write a sentence 1 1 1   Copy design 1 1 1   Total score 30 30 30     Functional Status Survey: Is the patient deaf or have difficulty hearing?: No Does the patient have difficulty seeing, even when wearing glasses/contacts?: No Does the patient have difficulty concentrating, remembering, or making decisions?: No Does the patient have difficulty walking or climbing stairs?: No Does the patient have difficulty dressing or bathing?: No Does the patient have difficulty doing errands alone such as visiting a doctor's office or shopping?: No     11/14/2022    1:45 PM 04/03/2023    3:10 PM 09/04/2023    1:21 PM 01/08/2024    1:19 PM  04/08/2024    2:30 PM  Fall Risk  Falls in the past year? 0 0 0 0 0       04/08/2024    2:30 PM  Depression screen PHQ 2/9  Decreased Interest 0  Down, Depressed, Hopeless 0  PHQ - 2 Score 0        No data to display            Current Medication: Outpatient Encounter Medications as of 04/08/2024  Medication Sig   acetaminophen  (TYLENOL ) 500 MG tablet Take 1,000 mg by mouth every 6 (six) hours as needed for moderate pain.   aspirin  EC 81 MG tablet Take 81 mg by mouth daily. Swallow whole.   cholecalciferol  (VITAMIN D ) 1000 units tablet Take 1,000 Units by mouth daily.   clonazePAM  (KLONOPIN ) 0.5 MG tablet TAKE 1 TABLET (0.5 MG TOTAL) BY MOUTH AS NEEDED FOR ANXIETY. TAKE 1 TABLET BY MOUTH AS NEEDED   diltiazem  (CARDIZEM  LA) 240 MG 24 hr tablet TAKE 1 TABLET BY MOUTH EVERY DAY   estradiol  (ESTRACE ) 0.01 % CREA vaginal cream Apply one pea-sized amount around the opening of the urethra daily for 2 weeks, then 3 times weekly moving forward.   linaclotide  (LINZESS ) 290 MCG CAPS capsule Take 1 capsule (290 mcg total) by mouth daily. (Patient taking differently: Take 290 mcg by mouth daily before  breakfast.)   losartan  (COZAAR ) 100 MG tablet Take 100 mg by mouth every morning.   ondansetron  (ZOFRAN -ODT) 4 MG disintegrating tablet Take 1 tablet (4 mg total) by mouth every 8 (eight) hours as needed. (Patient taking differently: Take 4 mg by mouth every 8 (eight) hours as needed for nausea or vomiting.)   rosuvastatin  (CRESTOR ) 40 MG tablet Take 40 mg by mouth at bedtime.   No facility-administered encounter medications on file as of 04/08/2024.    Surgical History: Past Surgical History:  Procedure Laterality Date   APPENDECTOMY  1971   ARTHRODESIS  1980   BACK SURGERY  1980   CATARACT EXTRACTION W/PHACO Left 01/05/2024   Procedure: PHACOEMULSIFICATION, CATARACT, WITH IOL INSERTION 5.38 00:41.3;  Surgeon: Myrna Adine Anes, MD;  Location: [facility];  Service:  Ophthalmology;  Laterality: Left;   CATARACT EXTRACTION W/PHACO Right 01/19/2024   Procedure: PHACOEMULSIFICATION, CATARACT, WITH IOL INSERTION 3.67 00:31.9;  Surgeon: Myrna Adine Anes, MD;  Location: [facility];  Service: Ophthalmology;  Laterality: Right;   CHOLECYSTECTOMY N/A    COLONOSCOPY WITH PROPOFOL  N/A 12/12/2016   Procedure: COLONOSCOPY WITH PROPOFOL ;  Surgeon: Gaylyn Gladis PENNER, MD;  Location: [facility];  Service: Endoscopy;  Laterality: N/A;   COLONOSCOPY WITH PROPOFOL  N/A 07/14/2019   Procedure: COLONOSCOPY WITH PROPOFOL ;  Surgeon: Toledo, Ladell POUR, MD;  Location: [facility] END[facility];  Service: Gastroenterology;  Laterality: N/A;   ESOPHAGOGASTRODUODENOSCOPY N/A 12/27/2021   Procedure: ESOPHAGOGASTRODUODENOSCOPY (EGD);  Surgeon: Tye Millet, DO;  Location: [facility] ENDOSCOPY;  Service: General;  Laterality: N/A;   ESOPHAGOGASTRODUODENOSCOPY (EGD) WITH PROPOFOL  N/A 12/12/2016   Procedure: ESOPHAGOGASTRODUODENOSCOPY (EGD) WITH PROPOFOL ;  Surgeon: Gaylyn Gladis PENNER, MD;  Location: [facility] END[facility];  Service: Endoscopy;  Laterality: N/A;   ESOPHAGOGASTRODUODENOSCOPY (EGD) WITH PROPOFOL  N/A 07/14/2019   Procedure: ESOPHAGOGASTRODUODENOSCOPY (EGD) WITH PROPOFOL ;  Surgeon: Toledo, Ladell POUR, MD;  Location: [facility];  Service: Gastroenterology;  Laterality: N/A;   INSERTION OF MESH  02/05/2022   Procedure: INSERTION OF MESH;  Surgeon: Jordis Laneta FALCON, MD;  Location: [facility];  Service: General;;   LAPAROSCOPIC UNILATERAL SALPINGO OOPHERECTOMY  1985   right   LEFT HEART CATH AND CORONARY ANGIOGRAPHY Left 09/25/2001   Procedure: LEFT HEART CATH AND CORONARY ANGIOGRAPHY; Location: [facility]; Surgeon: Margie Lovelace, MD   LEFT HEART CATH AND CORONARY ANGIOGRAPHY Left 06/09/2003   Procedure: LEFT HEART CATH AND CORONARY ANGIOGRAPHY; Location: [facility]; Surgeon: Margie Lovelace, MD   ORIF ANKLE FRACTURE Right    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    TONSILLECTOMY  1948    TUBAL LIGATION  1971   UMBILICAL HERNIA REPAIR N/A 02/05/2022   Procedure: HERNIA REPAIR UMBILICAL ADULT;  Surgeon: Jordis Laneta FALCON, MD;  Location: [facility];  Service: General;  Laterality: N/A;   VAGINAL HYSTERECTOMY  1985   d/t heavy bleeding   XI ROBOTIC ASSISTED PARAESOPHAGEAL HERNIA REPAIR N/A 02/05/2022   Procedure: XI ROBOTIC ASSISTED PARAESOPHAGEAL HERNIA REPAIR, RNFA to assist;  Surgeon: Jordis Laneta FALCON, MD;  Location: [facility];  Service: General;  Laterality: N/A;    Medical History: Past Medical History:  Diagnosis Date   Anxiety    Aortic atherosclerosis    Arthritis    Bilateral renal cysts    Coronary artery disease 09/25/2001   a.) LHC 09/25/2001: EF 78%. 20% pLAD - med mgmt; b.) LHC 06/09/2003: EF 63%. 20% pLCx, 20% pLAD, 50% mLAD, 50% dLAD - med mgmt.   Diastolic dysfunction 12/30/2017   a.) Stress echo 12/30/2017: ED >55%,  triv PR, mild AR.MR, mod TR, G1DD; b.) TTE 03/30/2020: EF >55%, mild LVH, mild panvalvular regurgitation.   Dysrhythmia    palpitations when dehydrated   Family history of adverse reaction to anesthesia    daughter had PONV   Gastritis    Generalized anxiety disorder    GERD (gastroesophageal reflux disease)    Headache    with HBP   Hepatic steatosis    History of 2019 novel coronavirus disease (COVID-19) 01/11/2022   History of hiatal hernia    a.) s/p repair 01/2022   Hyperlipemia    Hyperplastic colon polyp    Hypertension    Hypoglycemia 05/2022   this is something new-seeing pcp for this currently   Hypoglycemia    occaisionally when not eating regularly   Pneumonia    10 years ago   PONV (postoperative nausea and vomiting)    a.) PONV following orthopedic surgery (hip) in 2022   Prediabetes    PSVT (paroxysmal supraventricular tachycardia)    Redundant colon    Stroke (HCC)    mini stroke a year ago with no effects afterwards   TIA (transient ischemic attack)    Tubular adenoma of colon    Umbilical hernia     Family  History: Family History  Problem Relation Age of Onset   Heart failure Mother    Stroke Mother    Breast cancer Mother        dx at age 61 yo   Diabetes Mother    Osteoporosis Mother    Emphysema Father    Ovarian cancer Sister        dx at age 34 yo   Asthma Sister    Diabetes Daughter    Heart disease Daughter    COPD Daughter     Social History   Socioeconomic History   Marital status: Widowed    Spouse name: Not on file   Number of children: Not on file   Years of education: Not on file   Highest education level: Not on file  Occupational History   Not on file  Tobacco Use   Smoking status: Former    Current packs/day: 0.00    Average packs/day: 0.5 packs/day for 5.0 years (2.5 ttl pk-yrs)    Types: Cigarettes    Start date: 76    Quit date: 47    Years since quitting: 45.9    Passive exposure: Past   Smokeless tobacco: Never  Vaping Use   Vaping status: Never Used  Substance and Sexual Activity   Alcohol use: No    Alcohol/week: 0.0 standard drinks of alcohol   Drug use: Never   Sexual activity: Not Currently  Other Topics Concern   Not on file  Social History Narrative   Not on file   Social Drivers of Health   Tobacco Use: Medium Risk (04/08/2024)   Patient History    Smoking Tobacco Use: Former    Smokeless Tobacco Use: Never    Passive Exposure: Past  Physicist, Medical Strain: Not on file  Food Insecurity: No Food Insecurity (01/25/2023)   Hunger Vital Sign    Worried About Running Out of Food in the Last Year: Never true    Ran Out of Food in the Last Year: Never true  Transportation Needs: No Transportation Needs (01/25/2023)   PRAPARE - Administrator, Civil Service (Medical): No    Lack of Transportation (Non-Medical): No  Physical Activity: Not on file  Stress: Not  on file  Social Connections: Not on file  Intimate Partner Violence: Unknown (01/25/2023)   Humiliation, Afraid, Rape, and Kick questionnaire    Fear of  Current or Ex-Partner: No    Emotionally Abused: No    Physically Abused: Not on file    Sexually Abused: No  Depression (PHQ2-9): Low Risk (04/08/2024)   Depression (PHQ2-9)    PHQ-2 Score: 0  Alcohol Screen: Not on file  Housing: Patient Declined (01/25/2023)   Housing    Last Housing Risk Score: 0  Utilities: Not At Risk (01/25/2023)   AHC Utilities    Threatened with loss of utilities: No  Health Literacy: Not on file      Review of Systems  Constitutional:  Negative for chills and unexpected weight change.  HENT:  Negative for congestion, dental problem, postnasal drip, rhinorrhea, sneezing and sore throat.   Eyes:  Negative for redness.  Respiratory: Negative.  Negative for cough, chest tightness, shortness of breath and wheezing.   Cardiovascular: Negative.  Negative for chest pain and palpitations.  Gastrointestinal:  Negative for abdominal pain, constipation, diarrhea, nausea and vomiting.  Genitourinary:  Negative for dysuria and frequency.  Musculoskeletal:  Positive for arthralgias and gait problem. Negative for back pain, joint swelling and neck pain.  Skin:  Negative for rash.  Neurological:  Negative for tremors and numbness.  Hematological:  Negative for adenopathy. Does not bruise/bleed easily.  Psychiatric/Behavioral:  Negative for behavioral problems (Depression) and suicidal ideas. The patient is nervous/anxious.     Vital Signs: BP 120/81   Pulse 74   Temp 98 F (36.7 C)   Resp 16   Ht 5' 5 (1.651 m)   Wt 163 lb (73.9 kg)   SpO2 99%   BMI 27.12 kg/m    Physical Exam Vitals and nursing note reviewed.  Constitutional:      Appearance: Normal appearance.  HENT:     Head: Normocephalic and atraumatic.  Eyes:     Extraocular Movements: Extraocular movements intact.  Cardiovascular:     Rate and Rhythm: Normal rate and regular rhythm.  Pulmonary:     Effort: Pulmonary effort is normal.     Breath sounds: Normal breath sounds.  Skin:     General: Skin is warm and dry.  Neurological:     Mental Status: She is alert.     Gait: Gait abnormal.     Comments: Walking with cane  Psychiatric:        Mood and Affect: Mood normal.        Behavior: Behavior normal.        Assessment/Plan: 1. Encounter for Medicare annual examination with abnormal findings (Primary) AWV performed  2. Stage 3a chronic kidney disease (HCC) Improved, continue to monitor  3. Balance problem Using cane, but will contact insurance about coverage for walker ordered last visit  4. Recurrent UTI Followed by urology    General Counseling: Foy oakland understanding of the findings of todays visit and agrees with plan of treatment. I have discussed any further diagnostic evaluation that may be needed or ordered today. We also reviewed her medications today. she has been encouraged to call the office with any questions or concerns that should arise related to todays visit.    No orders of the defined types were placed in this encounter.   No orders of the defined types were placed in this encounter.   Return in about 6 months (around 10/07/2024) for general follow up.   Total time  spent:35 Minutes Time spent includes review of chart, medications, test results, and follow up plan with the patient.   [place] Controlled Substance Database was reviewed by me.  This patient was seen by Tinnie Pro, PA-C in collaboration with Dr. Sigrid Bathe as a part of collaborative care agreement.  Tinnie Pro, PA-C Internal medicine

## 2024-05-08 ENCOUNTER — Other Ambulatory Visit: Payer: Self-pay

## 2024-05-08 ENCOUNTER — Emergency Department

## 2024-05-08 ENCOUNTER — Emergency Department
Admission: EM | Admit: 2024-05-08 | Discharge: 2024-05-08 | Disposition: A | Discharge status: Home / Self Care | Attending: Emergency Medicine | Admitting: Emergency Medicine

## 2024-05-08 DIAGNOSIS — I10 Essential (primary) hypertension: Secondary | ICD-10-CM | POA: Diagnosis not present

## 2024-05-08 DIAGNOSIS — Z8616 Personal history of COVID-19: Secondary | ICD-10-CM | POA: Diagnosis not present

## 2024-05-08 DIAGNOSIS — I2510 Atherosclerotic heart disease of native coronary artery without angina pectoris: Secondary | ICD-10-CM | POA: Diagnosis not present

## 2024-05-08 DIAGNOSIS — F419 Anxiety disorder, unspecified: Secondary | ICD-10-CM | POA: Diagnosis not present

## 2024-05-08 DIAGNOSIS — Z96641 Presence of right artificial hip joint: Secondary | ICD-10-CM | POA: Insufficient documentation

## 2024-05-08 DIAGNOSIS — R002 Palpitations: Secondary | ICD-10-CM | POA: Insufficient documentation

## 2024-05-08 DIAGNOSIS — Z8673 Personal history of transient ischemic attack (TIA), and cerebral infarction without residual deficits: Secondary | ICD-10-CM | POA: Insufficient documentation

## 2024-05-08 LAB — BASIC METABOLIC PANEL WITH GFR
Anion gap: 11 (ref 5–15)
BUN: 14 mg/dL (ref 8–23)
CO2: 24 mmol/L (ref 22–32)
Calcium: 9.3 mg/dL (ref 8.9–10.3)
Chloride: 105 mmol/L (ref 98–111)
Creatinine, Ser: 1.09 mg/dL — ABNORMAL HIGH (ref 0.44–1.00)
GFR, Estimated: 50 mL/min — ABNORMAL LOW
Glucose, Bld: 115 mg/dL — ABNORMAL HIGH (ref 70–99)
Potassium: 3.9 mmol/L (ref 3.5–5.1)
Sodium: 140 mmol/L (ref 135–145)

## 2024-05-08 LAB — CBC
HCT: 39.6 % (ref 36.0–46.0)
Hemoglobin: 12.8 g/dL (ref 12.0–15.0)
MCH: 28.1 pg (ref 26.0–34.0)
MCHC: 32.3 g/dL (ref 30.0–36.0)
MCV: 87 fL (ref 80.0–100.0)
Platelets: 244 K/uL (ref 150–400)
RBC: 4.55 MIL/uL (ref 3.87–5.11)
RDW: 15.3 % (ref 11.5–15.5)
WBC: 6.3 K/uL (ref 4.0–10.5)
nRBC: 0 % (ref 0.0–0.2)

## 2024-05-08 LAB — TROPONIN T, HIGH SENSITIVITY: Troponin T High Sensitivity: 19 ng/L (ref 0–19)

## 2024-05-08 NOTE — ED Triage Notes (Signed)
 Pt to ED for HTN (170/98) and HR fluctuating into the 40s (according to pulse ox) and having palpitations like my heart beats out of my chest since last 2 days.  Pt takes losartan  and diltiazem . Denies hx a fib.  Skin is dry, respirations unlabored.  HR is NSR w PVCs on EKG.

## 2024-05-08 NOTE — ED Provider Notes (Signed)
 "  [facility] Provider Note    Event Date/Time   First MD Initiated Contact with Patient 05/08/24 2107     (approximate)   History   Chief Complaint: Palpitations and Hypertension   HPI  Amanda Duke is a 84 y.o. female with a history of hypertension, stroke, generalized anxiety, CAD who comes ED complaining of palpitations for the past 2 days.  Intermittent, no aggravating or alleviating factors, no chest pain or shortness of breath.  Used a home pulse oximeter to try checking her heart rate, found intermittent readings ranging from 45-90, felt anxious and came to the ED.  Reports completing a Holter monitor last year being told she does not have atrial fibrillation.  Review of outside records also shows an echocardiogram in this timeframe which was unremarkable.        Past Medical History:  Diagnosis Date   Anxiety    Aortic atherosclerosis    Arthritis    Bilateral renal cysts    Coronary artery disease 09/25/2001   a.) LHC 09/25/2001: EF 78%. 20% pLAD - med mgmt; b.) LHC 06/09/2003: EF 63%. 20% pLCx, 20% pLAD, 50% mLAD, 50% dLAD - med mgmt.   Diastolic dysfunction 12/30/2017   a.) Stress echo 12/30/2017: ED >55%, triv PR, mild AR.MR, mod TR, G1DD; b.) TTE 03/30/2020: EF >55%, mild LVH, mild panvalvular regurgitation.   Dysrhythmia    palpitations when dehydrated   Family history of adverse reaction to anesthesia    daughter had PONV   Gastritis    Generalized anxiety disorder    GERD (gastroesophageal reflux disease)    Headache    with HBP   Hepatic steatosis    History of 2019 novel coronavirus disease (COVID-19) 01/11/2022   History of hiatal hernia    a.) s/p repair 01/2022   Hyperlipemia    Hyperplastic colon polyp    Hypertension    Hypoglycemia 05/2022   this is something new-seeing pcp for this currently   Hypoglycemia    occaisionally when not eating regularly   Pneumonia    10 years ago   PONV (postoperative nausea and  vomiting)    a.) PONV following orthopedic surgery (hip) in 2022   Prediabetes    PSVT (paroxysmal supraventricular tachycardia)    Redundant colon    Stroke [facility])    mini stroke a year ago with no effects afterwards   TIA (transient ischemic attack)    Tubular adenoma of colon    Umbilical hernia     Current Outpatient Rx   Order #: [id number] Class: Historical Med   Order #: [id number] Class: Historical Med   Order #: [id number] Class: Historical Med   Order #: [id number] Class: Normal   Order #: [id number] Class: Normal   Order #: [id number] Class: Normal   Order #: [id number] Class: Normal   Order #: [id number] Class: Historical Med   Order #: [id number] Class: Normal   Order #: [id number] Class: Historical Med    Past Surgical History:  Procedure Laterality Date   APPENDECTOMY  1971   ARTHRODESIS  1980   BACK SURGERY  1980   CATARACT EXTRACTION W/PHACO Left 01/05/2024   Procedure: PHACOEMULSIFICATION, CATARACT, WITH IOL INSERTION 5.38 00:41.3;  Surgeon: Myrna Adine Anes, MD;  Location: [facility];  Service: Ophthalmology;  Laterality: Left;   CATARACT EXTRACTION W/PHACO Right 01/19/2024   Procedure: PHACOEMULSIFICATION, CATARACT, WITH IOL INSERTION 3.67 00:31.9;  Surgeon: Myrna Adine Anes, MD;  Location: [facility];  Service: Ophthalmology;  Laterality: Right;   CHOLECYSTECTOMY N/A  COLONOSCOPY WITH PROPOFOL  N/A 12/12/2016   Procedure: COLONOSCOPY WITH PROPOFOL ;  Surgeon: Gaylyn Gladis PENNER, MD;  Location: [facility] ENDOSCOPY;  Service: Endoscopy;  Laterality: N/A;   COLONOSCOPY WITH PROPOFOL  N/A 07/14/2019   Procedure: COLONOSCOPY WITH PROPOFOL ;  Surgeon: Toledo, Ladell POUR, MD;  Location: [facility] ENDOSCOPY;  Service: Gastroenterology;  Laterality: N/A;   ESOPHAGOGASTRODUODENOSCOPY N/A 12/27/2021   Procedure: ESOPHAGOGASTRODUODENOSCOPY (EGD);  Surgeon: Tye Millet, DO;  Location: [facility] ENDOSCOPY;  Service: General;  Laterality: N/A;   ESOPHAGOGASTRODUODENOSCOPY (EGD) WITH  PROPOFOL  N/A 12/12/2016   Procedure: ESOPHAGOGASTRODUODENOSCOPY (EGD) WITH PROPOFOL ;  Surgeon: Gaylyn Gladis PENNER, MD;  Location: [facility] ENDOSCOPY;  Service: Endoscopy;  Laterality: N/A;   ESOPHAGOGASTRODUODENOSCOPY (EGD) WITH PROPOFOL  N/A 07/14/2019   Procedure: ESOPHAGOGASTRODUODENOSCOPY (EGD) WITH PROPOFOL ;  Surgeon: Toledo, Ladell POUR, MD;  Location: [facility] ENDOSCOPY;  Service: Gastroenterology;  Laterality: N/A;   INSERTION OF MESH  02/05/2022   Procedure: INSERTION OF MESH;  Surgeon: Jordis Laneta FALCON, MD;  Location: [facility] ORS;  Service: General;;   LAPAROSCOPIC UNILATERAL SALPINGO OOPHERECTOMY  1985   right   LEFT HEART CATH AND CORONARY ANGIOGRAPHY Left 09/25/2001   Procedure: LEFT HEART CATH AND CORONARY ANGIOGRAPHY; Location: [facility]; Surgeon: Margie Lovelace, MD   LEFT HEART CATH AND CORONARY ANGIOGRAPHY Left 06/09/2003   Procedure: LEFT HEART CATH AND CORONARY ANGIOGRAPHY; Location: [facility]; Surgeon: Margie Lovelace, MD   ORIF ANKLE FRACTURE Right    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    TONSILLECTOMY  1948   TUBAL LIGATION  1971   UMBILICAL HERNIA REPAIR N/A 02/05/2022   Procedure: HERNIA REPAIR UMBILICAL ADULT;  Surgeon: Jordis Laneta FALCON, MD;  Location: [facility] ORS;  Service: General;  Laterality: N/A;   VAGINAL HYSTERECTOMY  1985   d/t heavy bleeding   XI ROBOTIC ASSISTED PARAESOPHAGEAL HERNIA REPAIR N/A 02/05/2022   Procedure: XI ROBOTIC ASSISTED PARAESOPHAGEAL HERNIA REPAIR, RNFA to assist;  Surgeon: Jordis Laneta FALCON, MD;  Location: [facility] ORS;  Service: General;  Laterality: N/A;    Physical Exam   Triage Vital Signs: ED Triage Vitals  Encounter Vitals Group     BP 05/08/24 1851 (!) 201/92     Girls Systolic BP Percentile --      Girls Diastolic BP Percentile --      Boys Systolic BP Percentile --      Boys Diastolic BP Percentile --      Pulse Rate 05/08/24 1851 89     Resp 05/08/24 1851 20     Temp 05/08/24 1851 98.2 F (36.8 C)     Temp Source 05/08/24 1851 Oral      SpO2 05/08/24 1851 97 %     Weight 05/08/24 1852 160 lb (72.6 kg)     Height 05/08/24 1852 5' 4 (1.626 m)     Head Circumference --      Peak Flow --      Pain Score 05/08/24 1851 0     Pain Loc --      Pain Education --      Exclude from Growth Chart --     Most recent vital signs: Vitals:   05/08/24 1851 05/08/24 2118  BP: (!) 201/92 (!) 143/65  Pulse: 89 62  Resp: 20 16  Temp: 98.2 F (36.8 C) 98.4 F (36.9 C)  SpO2: 97% 96%    General: Awake, no distress.  CV:  Good peripheral perfusion.  Irregularly irregular, ventricular beats in pairs.  Normal distal pulses Resp:  Normal effort.  Clear to auscultation bilaterally  Abd:  No distention.  Soft nontender Other:  No lower extremity edema   ED Results / Procedures / Treatments   Labs (all labs ordered are listed, but only abnormal results are displayed) Labs Reviewed  BASIC METABOLIC PANEL WITH GFR - Abnormal; Notable for the following components:      Result Value   Glucose, Bld 115 (*)    Creatinine, Ser 1.09 (*)    GFR, Estimated 50 (*)    All other components within normal limits  CBC  TROPONIN T, HIGH SENSITIVITY     EKG Interpreted by me Sinus rhythm rate of 94.  First-degree AV block, frequent PACs in bigeminy pattern. Compared to previous EKG on February 08, 2023, no significant change.  RADIOLOGY Chest x-ray interpreted by me, unremarkable.  Radiology report reviewed   PROCEDURES:  Procedures   MEDICATIONS ORDERED IN ED: Medications - No data to display   IMPRESSION / MDM / ASSESSMENT AND PLAN / ED COURSE  I reviewed the triage vital signs and the nursing notes.  DDx: Anxiety, electrolyte derangement, anemia, symptomatic palpitations, NSTEMI  Patient's presentation is most consistent with acute presentation with potential threat to life or bodily function.  Patient presents with feeling of anxiousness, feeling heart skipping extra beats.  Suspect she was getting inaccurate readings  from her battery-powered home pulse oximeter trying to measure her heart rate.  Here heart rate is normal.  EKG does show frequent PACs, which was present on previous EKGs such as 02/08/2023.  Labs here are normal.  No signs of ischemia, doubt pericardial effusion, PE, dissection.  Stable for discharge, she will follow-up with her cardiologist this week.       FINAL CLINICAL IMPRESSION(S) / ED DIAGNOSES   Final diagnoses:  Palpitations  Anxiety     Rx / DC Orders   ED Discharge Orders          Ordered    Ambulatory referral to Cardiology       Comments: If you have not heard from the Cardiology office within the next 72 hours please call [phone].   05/08/24 2214             Note:  This document was prepared using Dragon voice recognition software and may include unintentional dictation errors.   Viviann Pastor, MD 05/08/24 2218  "

## 2024-05-11 ENCOUNTER — Other Ambulatory Visit: Payer: Self-pay | Admitting: Physician Assistant

## 2024-05-11 DIAGNOSIS — F411 Generalized anxiety disorder: Secondary | ICD-10-CM

## 2024-05-12 NOTE — Progress Notes (Signed)
" °  Cardiology Office Note   Date:  05/13/2024  ID:  Amanda, Duke November 17, 1940, MRN [id number] PCP: Amanda Duke Amanda Duke  [facility] Providers Cardiologist:  Caron Poser, MD     History of Present Illness Amanda Duke is a 84 y.o. female PMH HTN, HLD, CKD IIIa who presents for further evaluation of palpitations and paroxysmal tachycardia.  Seen in ED for this issue 05/08/24. ECG showed frequent PACs. Troponin, CBC, BMP unremarkable. Last LDL 74 01/2024.  Patient reports intermittent palpitations that are sometimes short-lived, sometimes last for a while.  No syncope.  Does not drink a lot of coffee, but does eat chocolate sometimes which she is wondering about caffeine content.  Relevant CVD History -TTE 12/2022 LVEF 65-70%, normal RV size/fxn, mild MR, mild AR -CT chest 07/2016 aortic atherosclerosis and CAC  ROS: Pt denies any chest discomfort, jaw pain, arm pain, syncope, presyncope, orthopnea, PND, or LE edema.  Studies Reviewed I have independently reviewed the patient's ECG, prior CT scan, previous cardiac testing, previous medical records, previous bloodwork.  Physical Exam VS:  BP 132/78 (BP Location: Left Arm, Patient Position: Sitting, Cuff Size: Normal)   Pulse 71   Ht 5' 4 (1.626 m)   Wt 163 lb (73.9 kg)   SpO2 98%   BMI 27.98 kg/m        Wt Readings from Last 3 Encounters:  05/13/24 163 lb (73.9 kg)  05/08/24 160 lb (72.6 kg)  04/08/24 163 lb (73.9 kg)    GEN: No acute distress. NECK: No JVD; No carotid bruits. CARDIAC: RRR, no murmurs, rubs, gallops. RESPIRATORY:  Clear to auscultation. EXTREMITIES:  Warm and well-perfused. No edema.  ASSESSMENT AND PLAN Paroxysmal tachycardia Palpitations Undifferentiated.  No high risk features such as syncope.  Recent thyroid  testing normal.  Plan: - Zio monitor to evaluate for arrhythmia - Echocardiogram to rule out structural causes - Further plans pending results  CAC Aortic  atherosclerosis HLD Seen on prior CT imaging.  Last LDL 74 01/2024.  Plan: - Continue ASA 81 mg daily - Continue Crestor  40 mg daily; can add Zetia to her regimen if her next cholesterol check is significantly higher        Dispo: RTC 3 months or sooner as needed  Signed, Caron Poser, MD  "

## 2024-05-13 ENCOUNTER — Ambulatory Visit

## 2024-05-13 VITALS — BP 132/78 | HR 71 | Ht 64.0 in | Wt 163.0 lb

## 2024-05-13 DIAGNOSIS — E782 Mixed hyperlipidemia: Secondary | ICD-10-CM | POA: Diagnosis not present

## 2024-05-13 DIAGNOSIS — I2510 Atherosclerotic heart disease of native coronary artery without angina pectoris: Secondary | ICD-10-CM

## 2024-05-13 DIAGNOSIS — I479 Paroxysmal tachycardia, unspecified: Secondary | ICD-10-CM | POA: Diagnosis not present

## 2024-05-13 DIAGNOSIS — R002 Palpitations: Secondary | ICD-10-CM

## 2024-05-13 DIAGNOSIS — I700 Atherosclerosis of aorta: Secondary | ICD-10-CM

## 2024-05-13 NOTE — Patient Instructions (Addendum)
 Medication Instructions:   Your physician recommends that you continue on your current medications as directed. Please refer to the Current Medication list given to you today.   *If you need a refill on your cardiac medications before your next appointment, please call your pharmacy*  Lab Work:  No labs ordered today   If you have labs (blood work) drawn today and your tests are completely normal, you will receive your results only by: MyChart Message (if you have MyChart) OR A paper copy in the mail If you have any lab test that is abnormal or we need to change your treatment, we will call you to review the results.  Testing/Procedures:  Your physician has requested that you have an echocardiogram. Echocardiography is a painless test that uses sound waves to create images of your heart. It provides your doctor with information about the size and shape of your heart and how well your hearts chambers and valves are working.   You may receive an ultrasound enhancing agent through an IV if needed to better visualize your heart during the echo. This procedure takes approximately one hour.  There are no restrictions for this procedure.  This will take place at [address] [facility] [address], [place] [address]  Please note: We ask at that you not bring children with you during ultrasound (echo/ vascular) testing. Due to room size and safety concerns, children are not allowed in the ultrasound rooms during exams. Our front office staff cannot provide observation of children in our lobby area while testing is being conducted. An adult accompanying a patient to their appointment will only be allowed in the ultrasound room at the discretion of the ultrasound technician under special circumstances. We apologize for any inconvenience.     ZIO XT- Long Term Monitor Instructions  Your physician has requested you wear a ZIO patch monitor for 14 days.  This is a single patch monitor.  [employer] supplies one patch monitor per enrollment. Additional stickers are not available. Please do not apply patch if you will be having a Nuclear Stress Test, Echocardiogram, Cardiac CT, MRI, or Chest Xray during the period you would be wearing the monitor. The patch cannot be worn during these tests. You cannot remove and re-apply the ZIO XT patch monitor.  Your ZIO patch monitor will be mailed 3 day USPS to your address on file. It may take 3-5 days to receive your monitor after you have been enrolled. Once you have received your monitor, please review the enclosed instructions. Your monitor has already been registered assigning a specific monitor serial number to you.  Billing and Patient Assistance Program Information  We have supplied [employer] with any of your insurance information on file for billing purposes.  [employer] offers a sliding scale Patient Assistance Program for patients that do not have insurance, or whose insurance does not completely cover the cost of the ZIO monitor.  You must apply for the Patient Assistance Program to qualify for this discounted rate.  To apply, please call [employer] at [phone], select option 4, select option 2, ask to apply for Patient Assistance Program. Meredeth will ask your household income, and how many people are in your household. They will quote your out-of-pocket cost based on that information. [employer] will also be able to set up a 10-month, interest-free payment plan if needed.  Applying the monitor    Hold abrader disc by orange tab. Rub abrader in 40 strokes over the upper left chest as indicated  in your monitor instructions.  Clean area with 4 enclosed alcohol pads. Let dry.  Apply patch as indicated in monitor instructions. Patch will be placed under collarbone on left side of chest with arrow pointing upward.  Rub patch adhesive wings for 2 minutes. Remove white label marked 1. Remove the white label marked 2. Rub patch adhesive wings for 2  additional minutes.  While looking in a mirror, press and release button in center of patch. A small green light will flash 3-4 times. This will be your only indicator that the monitor has been turned on.   After Applying Monitor:  Do not shower for the first 24 hours. You may shower after the first 24 hours; however you must keep your back toward water;  monitor cannot be directly under water. Press the button if you feel a symptom. You will hear a small click. Record Date, Time and Symptom in the Patient Logbook.   After Completing 14 Days:  When you are ready to remove the patch, follow instructions on the last 2 pages of Patient Logbook.  Stick patch monitor into the tabs at the bottom of the return box.  Place Patient Logbook in the blue and white box. Use locking tab on box and tape box closed securely. The blue and white box has prepaid postage on it. Please place it in the mailbox as soon as possible. Your physician should have your test results approximately 7-14 days after the monitor has been mailed back to [facility].   Troubleshooting:  Call [facility] at [phone] if you have questions regarding your ZIO XT patch monitor.  Call them immediately if you see an orange light blinking on your monitor.  If your monitor falls off in less than 4 days, contact our Monitor department at [phone].  If your monitor becomes loose or falls off after 4 days call [employer] at [phone] for suggestions on securing your monitor.   Follow-Up: At [facility], you and your health needs are our priority.  As part of our continuing mission to provide you with exceptional heart care, our providers are all part of one team.  This team includes your primary Cardiologist (physician) and Advanced Practice Providers or APPs (Physician Assistants and Nurse Practitioners) who all work together to provide you with the care you need, when you need it.  Your next  appointment:  3 month(s)  Provider:  Caron Poser, MD    We recommend signing up for the patient portal called MyChart.  Sign up information is provided on this After Visit Summary.  MyChart is used to connect with patients for Virtual Visits (Telemedicine).  Patients are able to view lab/test results, encounter notes, upcoming appointments, etc.  Non-urgent messages can be sent to your provider as well.   To learn more about what you can do with MyChart, go to [email or web address].

## 2024-05-18 ENCOUNTER — Ambulatory Visit (INDEPENDENT_AMBULATORY_CARE_PROVIDER_SITE_OTHER): Admitting: Internal Medicine

## 2024-05-18 ENCOUNTER — Encounter: Payer: Self-pay | Admitting: Internal Medicine

## 2024-05-18 VITALS — BP 132/75 | HR 78 | Temp 98.0°F | Resp 16 | Ht 64.0 in | Wt 161.4 lb

## 2024-05-18 DIAGNOSIS — I491 Atrial premature depolarization: Secondary | ICD-10-CM | POA: Diagnosis not present

## 2024-05-18 DIAGNOSIS — I700 Atherosclerosis of aorta: Secondary | ICD-10-CM | POA: Diagnosis not present

## 2024-05-18 DIAGNOSIS — F411 Generalized anxiety disorder: Secondary | ICD-10-CM | POA: Diagnosis not present

## 2024-05-18 NOTE — Progress Notes (Signed)
 [facility] [address] [place], [place] [address]  Internal MEDICINE  Office Visit Note  Patient Name: Amanda Duke  [id number]  [id number]  Date of Service: 06/01/2024  Chief Complaint  Patient presents with   Follow-up    ED follow-up for palpitations    HPI  Pt is seen after ED visit  C/O palpitations, She became very anxious and went to ED, was found to have elevated BP, EKG showed few PAC, work up for PE, CAD ot pericardial effusion was negative and pt was sent home with a referral for cardiology  She feels well, has been on Cardizem , will see cardiology in 2 days , will get ZIO monitor  She thinks Klonopin  does help with anxiety    Current Medication: Outpatient Encounter Medications as of 05/18/2024  Medication Sig   acetaminophen  (TYLENOL ) 500 MG tablet Take 1,000 mg by mouth every 6 (six) hours as needed for moderate pain.   aspirin  EC 81 MG tablet Take 81 mg by mouth daily. Swallow whole.   cholecalciferol  (VITAMIN D ) 1000 units tablet Take 1,000 Units by mouth daily.   clonazePAM  (KLONOPIN ) 0.5 MG tablet TAKE 1 TABLET (0.5 MG TOTAL) BY MOUTH AS NEEDED FOR ANXIETY. TAKE 1 TABLET BY MOUTH AS NEEDED   diltiazem  (CARDIZEM  LA) 240 MG 24 hr tablet TAKE 1 TABLET BY MOUTH EVERY DAY   estradiol  (ESTRACE ) 0.01 % CREA vaginal cream Apply one pea-sized amount around the opening of the urethra daily for 2 weeks, then 3 times weekly moving forward.   linaclotide  (LINZESS ) 290 MCG CAPS capsule Take 1 capsule (290 mcg total) by mouth daily.   losartan  (COZAAR ) 100 MG tablet Take 100 mg by mouth every morning.   methocarbamol (ROBAXIN) 500 MG tablet Take 500 mg by mouth 3 (three) times daily.   omeprazole (PRILOSEC) 40 MG capsule TAKE 1 CAPSULE BY MOUTH 2 TIMES DAILY BEFORE MEALS.   ondansetron  (ZOFRAN -ODT) 4 MG disintegrating tablet Take 1 tablet (4 mg total) by mouth every 8 (eight) hours as needed.   rosuvastatin  (CRESTOR ) 40 MG tablet Take 40 mg by mouth at  bedtime.   No facility-administered encounter medications on file as of 05/18/2024.    Surgical History: Past Surgical History:  Procedure Laterality Date   APPENDECTOMY  1971   ARTHRODESIS  1980   BACK SURGERY  1980   CATARACT EXTRACTION W/PHACO Left 01/05/2024   Procedure: PHACOEMULSIFICATION, CATARACT, WITH IOL INSERTION 5.38 00:41.3;  Surgeon: Myrna Adine Anes, MD;  [facility];  Service: Ophthalmology;  Laterality: Left;   CATARACT EXTRACTION W/PHACO Right 01/19/2024   Procedure: PHACOEMULSIFICATION, CATARACT, WITH IOL INSERTION 3.67 00:31.9;  Surgeon: Myrna Adine Anes, MD;  Location: [facility];  Service: Ophthalmology;  Laterality: Right;   CHOLECYSTECTOMY N/A    COLONOSCOPY WITH PROPOFOL  N/A 12/12/2016   Procedure: COLONOSCOPY WITH PROPOFOL ;  Surgeon: Gaylyn Gladis PENNER, MD;  Location: [facility] ENDOSCOPY;  Service: Endoscopy;  Laterality: N/A;   COLONOSCOPY WITH PROPOFOL  N/A 07/14/2019   Procedure: COLONOSCOPY WITH PROPOFOL ;  Surgeon: Toledo, Ladell POUR, MD;  Location: [facility] ENDOSCOPY;  Service: Gastroenterology;  Laterality: N/A;   ESOPHAGOGASTRODUODENOSCOPY N/A 12/27/2021   Procedure: ESOPHAGOGASTRODUODENOSCOPY (EGD);  Surgeon: Tye Millet, DO;  Location: [facility] ENDOSCOPY;  Service: General;  Laterality: N/A;   ESOPHAGOGASTRODUODENOSCOPY (EGD) WITH PROPOFOL  N/A 12/12/2016   Procedure: ESOPHAGOGASTRODUODENOSCOPY (EGD) WITH PROPOFOL ;  Surgeon: Gaylyn Gladis PENNER, MD;  Location: [facility] ENDOSCOPY;  Service: Endoscopy;  Laterality: N/A;   ESOPHAGOGASTRODUODENOSCOPY (EGD) WITH PROPOFOL  N/A 07/14/2019  Procedure: ESOPHAGOGASTRODUODENOSCOPY (EGD) WITH PROPOFOL ;  Surgeon: Toledo, Ladell POUR, MD;  Location: [facility] ENDOSCOPY;  Service: Gastroenterology;  Laterality: N/A;   INSERTION OF MESH  02/05/2022   Procedure: INSERTION OF MESH;  Surgeon: Jordis Laneta FALCON, MD;  Location: [facility] ORS;  Service: General;;   LAPAROSCOPIC UNILATERAL SALPINGO OOPHERECTOMY  1985   right   LEFT  HEART CATH AND CORONARY ANGIOGRAPHY Left 09/25/2001   Procedure: LEFT HEART CATH AND CORONARY ANGIOGRAPHY; Location: [facility]; Surgeon: Margie Lovelace, MD   LEFT HEART CATH AND CORONARY ANGIOGRAPHY Left 06/09/2003   Procedure: LEFT HEART CATH AND CORONARY ANGIOGRAPHY; Location: [facility]; Surgeon: Margie Lovelace, MD   ORIF ANKLE FRACTURE Right    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    TONSILLECTOMY  1948   TUBAL LIGATION  1971   UMBILICAL HERNIA REPAIR N/A 02/05/2022   Procedure: HERNIA REPAIR UMBILICAL ADULT;  Surgeon: Jordis Laneta FALCON, MD;  Location: [facility] ORS;  Service: General;  Laterality: N/A;   VAGINAL HYSTERECTOMY  1985   d/t heavy bleeding   XI ROBOTIC ASSISTED PARAESOPHAGEAL HERNIA REPAIR N/A 02/05/2022   Procedure: XI ROBOTIC ASSISTED PARAESOPHAGEAL HERNIA REPAIR, RNFA to assist;  Surgeon: Jordis Laneta FALCON, MD;  Location: [facility] ORS;  Service: General;  Laterality: N/A;    Medical History: Past Medical History:  Diagnosis Date   Anxiety    Aortic atherosclerosis    Arthritis    Bilateral renal cysts    Coronary artery disease 09/25/2001   a.) LHC 09/25/2001: EF 78%. 20% pLAD - med mgmt; b.) LHC 06/09/2003: EF 63%. 20% pLCx, 20% pLAD, 50% mLAD, 50% dLAD - med mgmt.   Diastolic dysfunction 12/30/2017   a.) Stress echo 12/30/2017: ED >55%, triv PR, mild AR.MR, mod TR, G1DD; b.) TTE 03/30/2020: EF >55%, mild LVH, mild panvalvular regurgitation.   Dysrhythmia    palpitations when dehydrated   Family history of adverse reaction to anesthesia    daughter had PONV   Gastritis    Generalized anxiety disorder    GERD (gastroesophageal reflux disease)    Headache    with HBP   Hepatic steatosis    History of 2019 novel coronavirus disease (COVID-19) 01/11/2022   History of hiatal hernia    a.) s/p repair 01/2022   Hyperlipemia    Hyperplastic colon polyp    Hypertension    Hypoglycemia 05/2022   this is something new-seeing pcp for this currently   Hypoglycemia     occaisionally when not eating regularly   Pneumonia    10 years ago   PONV (postoperative nausea and vomiting)    a.) PONV following orthopedic surgery (hip) in 2022   Prediabetes    PSVT (paroxysmal supraventricular tachycardia)    Redundant colon    Stroke (HCC)    mini stroke a year ago with no effects afterwards   TIA (transient ischemic attack)    Tubular adenoma of colon    Umbilical hernia     Family History: Family History  Problem Relation Age of Onset   Heart failure Mother    Stroke Mother    Breast cancer Mother        dx at age 74 yo   Diabetes Mother    Osteoporosis Mother    Emphysema Father    Ovarian cancer Sister        dx at age 53 yo   Asthma Sister    Diabetes Daughter    Heart disease Daughter    COPD Daughter  Social History   Socioeconomic History   Marital status: Widowed    Spouse name: Not on file   Number of children: Not on file   Years of education: Not on file   Highest education level: Not on file  Occupational History   Not on file  Tobacco Use   Smoking status: Former    Current packs/day: 0.00    Average packs/day: 0.5 packs/day for 5.0 years (2.5 ttl pk-yrs)    Types: Cigarettes    Start date: 55    Quit date: 48    Years since quitting: 46.1    Passive exposure: Past   Smokeless tobacco: Never  Vaping Use   Vaping status: Never Used  Substance and Sexual Activity   Alcohol use: No    Alcohol/week: 0.0 standard drinks of alcohol   Drug use: Never   Sexual activity: Not Currently  Other Topics Concern   Not on file  Social History Narrative   Not on file   Social Drivers of Health   Tobacco Use: Medium Risk (05/18/2024)   Patient History    Smoking Tobacco Use: Former    Smokeless Tobacco Use: Never    Passive Exposure: Past  Physicist, Medical Strain: Not on file  Food Insecurity: No Food Insecurity (01/25/2023)   Hunger Vital Sign    Worried About Running Out of Food in the Last Year: Never true     Ran Out of Food in the Last Year: Never true  Transportation Needs: No Transportation Needs (01/25/2023)   PRAPARE - Administrator, Civil Service (Medical): No    Lack of Transportation (Non-Medical): No  Physical Activity: Not on file  Stress: Not on file  Social Connections: Not on file  Intimate Partner Violence: Unknown (01/25/2023)   Humiliation, Afraid, Rape, and Kick questionnaire    Fear of Current or Ex-Partner: No    Emotionally Abused: No    Physically Abused: Not on file    Sexually Abused: No  Depression (PHQ2-9): Low Risk (04/08/2024)   Depression (PHQ2-9)    PHQ-2 Score: 0  Alcohol Screen: Not on file  Housing: Patient Declined (01/25/2023)   Housing    Last Housing Risk Score: 0  Utilities: Not At Risk (01/25/2023)   [facility] Utilities    Threatened with loss of utilities: No  Health Literacy: Not on file      Review of Systems  Constitutional:  Negative for fatigue and fever.  HENT:  Negative for congestion, mouth sores and postnasal drip.   Respiratory:  Negative for cough.   Cardiovascular:  Negative for chest pain.  Genitourinary:  Negative for flank pain.  Psychiatric/Behavioral: Negative.      Vital Signs: BP 132/75   Pulse 78   Temp 98 F (36.7 C)   Resp 16   Ht 5' 4 (1.626 m)   Wt 161 lb 6.4 oz (73.2 kg)   SpO2 98%   BMI 27.70 kg/m    Physical Exam Constitutional:      Appearance: Normal appearance.  HENT:     Head: Normocephalic and atraumatic.     Nose: Nose normal.     Mouth/Throat:     Mouth: Mucous membranes are moist.     Pharynx: No posterior oropharyngeal erythema.  Eyes:     Extraocular Movements: Extraocular movements intact.     Pupils: Pupils are equal, round, and reactive to light.  Cardiovascular:     Pulses: Normal pulses.     Heart sounds:  Normal heart sounds.  Pulmonary:     Effort: Pulmonary effort is normal.     Breath sounds: Normal breath sounds.  Neurological:     General: No focal deficit present.      Mental Status: She is alert.  Psychiatric:        Mood and Affect: Mood normal.        Behavior: Behavior normal.        Assessment/Plan: 1. PAC (premature atrial contraction) (Primary) Followed by cardiology   2. Generalized anxiety disorder Continue Klonopin    3. Aortic atherosclerosis Continue Rosuvastatin     General Counseling: Floride verbalizes understanding of the findings of todays visit and agrees with plan of treatment. I have discussed any further diagnostic evaluation that may be needed or ordered today. We also reviewed her medications today. she has been encouraged to call the office with any questions or concerns that should arise related to todays visit.   Total time spent:35 Minutes Time spent includes review of chart, medications, test results, and follow up plan with the patient.   [place] Controlled Substance Database was reviewed by me.   Dr Kaegan Stigler M [facility] Internal [facility]

## 2024-06-01 ENCOUNTER — Ambulatory Visit

## 2024-06-01 DIAGNOSIS — I2510 Atherosclerotic heart disease of native coronary artery without angina pectoris: Secondary | ICD-10-CM | POA: Diagnosis not present

## 2024-06-01 DIAGNOSIS — I479 Paroxysmal tachycardia, unspecified: Secondary | ICD-10-CM

## 2024-06-01 DIAGNOSIS — R002 Palpitations: Secondary | ICD-10-CM | POA: Diagnosis not present

## 2024-06-01 LAB — ECHOCARDIOGRAM COMPLETE
AR max vel: 2.44 cm2
AV Area VTI: 2.12 cm2
AV Area mean vel: 2.35 cm2
AV Mean grad: 6 mmHg
AV Peak grad: 11 mmHg
Ao pk vel: 1.66 m/s
Area-P 1/2: 3.08 cm2
P 1/2 time: 129 ms
S' Lateral: 2.07 cm

## 2024-06-02 ENCOUNTER — Ambulatory Visit: Payer: Self-pay

## 2024-06-03 DIAGNOSIS — R002 Palpitations: Secondary | ICD-10-CM

## 2024-06-03 DIAGNOSIS — I2510 Atherosclerotic heart disease of native coronary artery without angina pectoris: Secondary | ICD-10-CM | POA: Diagnosis not present

## 2024-06-03 DIAGNOSIS — I479 Paroxysmal tachycardia, unspecified: Secondary | ICD-10-CM

## 2024-06-30 ENCOUNTER — Ambulatory Visit: Admitting: Physician Assistant

## 2024-07-06 ENCOUNTER — Ambulatory Visit (INDEPENDENT_AMBULATORY_CARE_PROVIDER_SITE_OTHER): Admitting: Vascular Surgery

## 2024-07-06 ENCOUNTER — Encounter (INDEPENDENT_AMBULATORY_CARE_PROVIDER_SITE_OTHER)

## 2024-08-12 ENCOUNTER — Ambulatory Visit

## 2024-10-07 ENCOUNTER — Ambulatory Visit: Admitting: Physician Assistant

## 2025-04-11 ENCOUNTER — Ambulatory Visit: Admitting: Physician Assistant
